# Patient Record
Sex: Male | Born: 1975 | Race: Black or African American | Hispanic: No | Marital: Single | Smoking: Current every day smoker
Health system: Southern US, Community
[De-identification: ages and names within clinical notes are randomized; demographics above are authoritative.]

## PROBLEM LIST (undated history)

## (undated) DIAGNOSIS — E669 Obesity, unspecified: Secondary | ICD-10-CM

## (undated) DIAGNOSIS — I4891 Unspecified atrial fibrillation: Secondary | ICD-10-CM

## (undated) DIAGNOSIS — Z720 Tobacco use: Secondary | ICD-10-CM

## (undated) DIAGNOSIS — I5022 Chronic systolic (congestive) heart failure: Secondary | ICD-10-CM

---

## 2015-09-17 ENCOUNTER — Inpatient Hospital Stay
Admission: EM | Admit: 2015-09-17 | Discharge: 2015-10-20 | DRG: 004 | Disposition: E | Payer: Medicaid Other | Attending: Specialist | Admitting: Specialist

## 2015-09-17 ENCOUNTER — Encounter: Payer: Self-pay | Admitting: Emergency Medicine

## 2015-09-17 ENCOUNTER — Emergency Department: Payer: Medicaid Other

## 2015-09-17 DIAGNOSIS — J9600 Acute respiratory failure, unspecified whether with hypoxia or hypercapnia: Secondary | ICD-10-CM

## 2015-09-17 DIAGNOSIS — R570 Cardiogenic shock: Secondary | ICD-10-CM | POA: Diagnosis not present

## 2015-09-17 DIAGNOSIS — F329 Major depressive disorder, single episode, unspecified: Secondary | ICD-10-CM | POA: Diagnosis present

## 2015-09-17 DIAGNOSIS — Z9911 Dependence on respirator [ventilator] status: Secondary | ICD-10-CM | POA: Diagnosis not present

## 2015-09-17 DIAGNOSIS — J80 Acute respiratory distress syndrome: Secondary | ICD-10-CM | POA: Diagnosis present

## 2015-09-17 DIAGNOSIS — F1410 Cocaine abuse, uncomplicated: Secondary | ICD-10-CM | POA: Diagnosis present

## 2015-09-17 DIAGNOSIS — J9690 Respiratory failure, unspecified, unspecified whether with hypoxia or hypercapnia: Secondary | ICD-10-CM

## 2015-09-17 DIAGNOSIS — Z4659 Encounter for fitting and adjustment of other gastrointestinal appliance and device: Secondary | ICD-10-CM | POA: Diagnosis not present

## 2015-09-17 DIAGNOSIS — Z452 Encounter for adjustment and management of vascular access device: Secondary | ICD-10-CM

## 2015-09-17 DIAGNOSIS — Z66 Do not resuscitate: Secondary | ICD-10-CM | POA: Diagnosis present

## 2015-09-17 DIAGNOSIS — R6521 Severe sepsis with septic shock: Secondary | ICD-10-CM | POA: Diagnosis present

## 2015-09-17 DIAGNOSIS — J9621 Acute and chronic respiratory failure with hypoxia: Secondary | ICD-10-CM | POA: Diagnosis not present

## 2015-09-17 DIAGNOSIS — N170 Acute kidney failure with tubular necrosis: Secondary | ICD-10-CM | POA: Diagnosis present

## 2015-09-17 DIAGNOSIS — I951 Orthostatic hypotension: Secondary | ICD-10-CM | POA: Insufficient documentation

## 2015-09-17 DIAGNOSIS — F17200 Nicotine dependence, unspecified, uncomplicated: Secondary | ICD-10-CM | POA: Diagnosis present

## 2015-09-17 DIAGNOSIS — I5043 Acute on chronic combined systolic (congestive) and diastolic (congestive) heart failure: Secondary | ICD-10-CM | POA: Diagnosis present

## 2015-09-17 DIAGNOSIS — G9341 Metabolic encephalopathy: Secondary | ICD-10-CM | POA: Diagnosis present

## 2015-09-17 DIAGNOSIS — I440 Atrioventricular block, first degree: Secondary | ICD-10-CM | POA: Diagnosis present

## 2015-09-17 DIAGNOSIS — J9602 Acute respiratory failure with hypercapnia: Secondary | ICD-10-CM | POA: Diagnosis present

## 2015-09-17 DIAGNOSIS — I5031 Acute diastolic (congestive) heart failure: Secondary | ICD-10-CM | POA: Diagnosis not present

## 2015-09-17 DIAGNOSIS — F419 Anxiety disorder, unspecified: Secondary | ICD-10-CM | POA: Diagnosis present

## 2015-09-17 DIAGNOSIS — D638 Anemia in other chronic diseases classified elsewhere: Secondary | ICD-10-CM | POA: Diagnosis present

## 2015-09-17 DIAGNOSIS — Z6841 Body Mass Index (BMI) 40.0 and over, adult: Secondary | ICD-10-CM | POA: Diagnosis not present

## 2015-09-17 DIAGNOSIS — D696 Thrombocytopenia, unspecified: Secondary | ICD-10-CM | POA: Diagnosis present

## 2015-09-17 DIAGNOSIS — A419 Sepsis, unspecified organism: Principal | ICD-10-CM | POA: Diagnosis present

## 2015-09-17 DIAGNOSIS — J9601 Acute respiratory failure with hypoxia: Secondary | ICD-10-CM

## 2015-09-17 DIAGNOSIS — G9340 Encephalopathy, unspecified: Secondary | ICD-10-CM | POA: Diagnosis not present

## 2015-09-17 DIAGNOSIS — E872 Acidosis: Secondary | ICD-10-CM | POA: Diagnosis present

## 2015-09-17 DIAGNOSIS — I480 Paroxysmal atrial fibrillation: Secondary | ICD-10-CM | POA: Diagnosis present

## 2015-09-17 DIAGNOSIS — Z7982 Long term (current) use of aspirin: Secondary | ICD-10-CM | POA: Diagnosis not present

## 2015-09-17 DIAGNOSIS — J189 Pneumonia, unspecified organism: Secondary | ICD-10-CM | POA: Diagnosis not present

## 2015-09-17 DIAGNOSIS — E662 Morbid (severe) obesity with alveolar hypoventilation: Secondary | ICD-10-CM | POA: Diagnosis present

## 2015-09-17 DIAGNOSIS — I4892 Unspecified atrial flutter: Secondary | ICD-10-CM | POA: Diagnosis present

## 2015-09-17 DIAGNOSIS — R140 Abdominal distension (gaseous): Secondary | ICD-10-CM

## 2015-09-17 DIAGNOSIS — R34 Anuria and oliguria: Secondary | ICD-10-CM | POA: Diagnosis present

## 2015-09-17 DIAGNOSIS — D7582 Heparin induced thrombocytopenia (HIT): Secondary | ICD-10-CM | POA: Diagnosis present

## 2015-09-17 DIAGNOSIS — T148XXA Other injury of unspecified body region, initial encounter: Secondary | ICD-10-CM

## 2015-09-17 DIAGNOSIS — I4891 Unspecified atrial fibrillation: Secondary | ICD-10-CM | POA: Diagnosis not present

## 2015-09-17 DIAGNOSIS — J9620 Acute and chronic respiratory failure, unspecified whether with hypoxia or hypercapnia: Secondary | ICD-10-CM | POA: Diagnosis not present

## 2015-09-17 DIAGNOSIS — I472 Ventricular tachycardia: Secondary | ICD-10-CM | POA: Diagnosis present

## 2015-09-17 DIAGNOSIS — I959 Hypotension, unspecified: Secondary | ICD-10-CM | POA: Diagnosis not present

## 2015-09-17 DIAGNOSIS — Z716 Tobacco abuse counseling: Secondary | ICD-10-CM

## 2015-09-17 DIAGNOSIS — Z95828 Presence of other vascular implants and grafts: Secondary | ICD-10-CM

## 2015-09-17 DIAGNOSIS — Z01818 Encounter for other preprocedural examination: Secondary | ICD-10-CM

## 2015-09-17 DIAGNOSIS — K761 Chronic passive congestion of liver: Secondary | ICD-10-CM | POA: Diagnosis present

## 2015-09-17 DIAGNOSIS — R17 Unspecified jaundice: Secondary | ICD-10-CM | POA: Diagnosis not present

## 2015-09-17 DIAGNOSIS — I2781 Cor pulmonale (chronic): Secondary | ICD-10-CM | POA: Diagnosis present

## 2015-09-17 DIAGNOSIS — I5033 Acute on chronic diastolic (congestive) heart failure: Secondary | ICD-10-CM | POA: Diagnosis not present

## 2015-09-17 DIAGNOSIS — J9622 Acute and chronic respiratory failure with hypercapnia: Secondary | ICD-10-CM | POA: Diagnosis not present

## 2015-09-17 DIAGNOSIS — K7460 Unspecified cirrhosis of liver: Secondary | ICD-10-CM | POA: Diagnosis present

## 2015-09-17 DIAGNOSIS — Z9119 Patient's noncompliance with other medical treatment and regimen: Secondary | ICD-10-CM | POA: Diagnosis not present

## 2015-09-17 DIAGNOSIS — I509 Heart failure, unspecified: Secondary | ICD-10-CM

## 2015-09-17 DIAGNOSIS — B372 Candidiasis of skin and nail: Secondary | ICD-10-CM | POA: Diagnosis present

## 2015-09-17 DIAGNOSIS — R0600 Dyspnea, unspecified: Secondary | ICD-10-CM

## 2015-09-17 DIAGNOSIS — T884XXA Failed or difficult intubation, initial encounter: Secondary | ICD-10-CM

## 2015-09-17 DIAGNOSIS — Z833 Family history of diabetes mellitus: Secondary | ICD-10-CM

## 2015-09-17 DIAGNOSIS — Z0189 Encounter for other specified special examinations: Secondary | ICD-10-CM

## 2015-09-17 DIAGNOSIS — N179 Acute kidney failure, unspecified: Secondary | ICD-10-CM | POA: Diagnosis not present

## 2015-09-17 DIAGNOSIS — G4730 Sleep apnea, unspecified: Secondary | ICD-10-CM | POA: Diagnosis present

## 2015-09-17 HISTORY — DX: Unspecified atrial fibrillation: I48.91

## 2015-09-17 HISTORY — DX: Chronic systolic (congestive) heart failure: I50.22

## 2015-09-17 HISTORY — DX: Obesity, unspecified: E66.9

## 2015-09-17 HISTORY — DX: Tobacco use: Z72.0

## 2015-09-17 LAB — CBC
HCT: 51 % (ref 40.0–52.0)
HEMOGLOBIN: 15.7 g/dL (ref 13.0–18.0)
MCH: 25.8 pg — AB (ref 26.0–34.0)
MCHC: 30.8 g/dL — AB (ref 32.0–36.0)
MCV: 83.7 fL (ref 80.0–100.0)
Platelets: 74 10*3/uL — ABNORMAL LOW (ref 150–440)
RBC: 6.1 MIL/uL — AB (ref 4.40–5.90)
RDW: 19.7 % — ABNORMAL HIGH (ref 11.5–14.5)
WBC: 7.4 10*3/uL (ref 3.8–10.6)

## 2015-09-17 LAB — COMPREHENSIVE METABOLIC PANEL
ALK PHOS: 93 U/L (ref 38–126)
ALT: 12 U/L — AB (ref 17–63)
ANION GAP: 7 (ref 5–15)
AST: 27 U/L (ref 15–41)
Albumin: 3.7 g/dL (ref 3.5–5.0)
BILIRUBIN TOTAL: 2.8 mg/dL — AB (ref 0.3–1.2)
BUN: 15 mg/dL (ref 6–20)
CALCIUM: 8.7 mg/dL — AB (ref 8.9–10.3)
CO2: 33 mmol/L — AB (ref 22–32)
CREATININE: 1.15 mg/dL (ref 0.61–1.24)
Chloride: 98 mmol/L — ABNORMAL LOW (ref 101–111)
Glucose, Bld: 104 mg/dL — ABNORMAL HIGH (ref 65–99)
Potassium: 4.1 mmol/L (ref 3.5–5.1)
SODIUM: 138 mmol/L (ref 135–145)
TOTAL PROTEIN: 7.9 g/dL (ref 6.5–8.1)

## 2015-09-17 LAB — BRAIN NATRIURETIC PEPTIDE: B NATRIURETIC PEPTIDE 5: 330 pg/mL — AB (ref 0.0–100.0)

## 2015-09-17 LAB — TROPONIN I

## 2015-09-17 MED ORDER — LEVOFLOXACIN IN D5W 750 MG/150ML IV SOLN
750.0000 mg | Freq: Once | INTRAVENOUS | Status: AC
  Administered 2015-09-18: 750 mg via INTRAVENOUS
  Filled 2015-09-17: qty 150

## 2015-09-17 MED ORDER — ALBUTEROL SULFATE (2.5 MG/3ML) 0.083% IN NEBU: 2.5000 mg | INHALATION_SOLUTION | RESPIRATORY_TRACT | Status: DC | PRN

## 2015-09-17 MED ORDER — FUROSEMIDE 10 MG/ML IJ SOLN
60.0000 mg | Freq: Once | INTRAMUSCULAR | Status: AC
  Administered 2015-09-17: 60 mg via INTRAVENOUS
  Filled 2015-09-17: qty 8

## 2015-09-17 MED ORDER — DOCUSATE SODIUM 100 MG PO CAPS
100.0000 mg | ORAL_CAPSULE | Freq: Two times a day (BID) | ORAL | Status: DC
  Administered 2015-09-18 (×2): 100 mg via ORAL
  Filled 2015-09-17 (×3): qty 1

## 2015-09-17 MED ORDER — ONDANSETRON HCL 4 MG/2ML IJ SOLN
4.0000 mg | Freq: Four times a day (QID) | INTRAMUSCULAR | Status: DC | PRN
  Administered 2015-09-20: 4 mg via INTRAVENOUS
  Filled 2015-09-17: qty 2

## 2015-09-17 MED ORDER — ASPIRIN EC 81 MG PO TBEC: 81.0000 mg | DELAYED_RELEASE_TABLET | Freq: Every day | ORAL | Status: DC

## 2015-09-17 MED ORDER — SODIUM CHLORIDE 0.9 % IJ SOLN: 3.0000 mL | INTRAMUSCULAR | Status: DC | PRN

## 2015-09-17 MED ORDER — ACETAMINOPHEN 650 MG RE SUPP: 650.0000 mg | Freq: Four times a day (QID) | RECTAL | Status: DC | PRN

## 2015-09-17 MED ORDER — POLYETHYLENE GLYCOL 3350 17 G PO PACK: 17.0000 g | PACK | Freq: Every day | ORAL | Status: DC | PRN

## 2015-09-17 MED ORDER — SODIUM CHLORIDE 0.9 % IV SOLN: 250.0000 mL | INTRAVENOUS | Status: DC | PRN

## 2015-09-17 MED ORDER — SODIUM CHLORIDE 0.9 % IJ SOLN
3.0000 mL | Freq: Two times a day (BID) | INTRAMUSCULAR | Status: DC
  Administered 2015-09-18 – 2015-10-04 (×23): 3 mL via INTRAVENOUS

## 2015-09-17 MED ORDER — ONDANSETRON HCL 4 MG PO TABS: 4.0000 mg | ORAL_TABLET | Freq: Four times a day (QID) | ORAL | Status: DC | PRN

## 2015-09-17 MED ORDER — SODIUM CHLORIDE 0.9 % IJ SOLN
3.0000 mL | Freq: Two times a day (BID) | INTRAMUSCULAR | Status: DC
  Administered 2015-09-18 – 2015-10-04 (×26): 3 mL via INTRAVENOUS

## 2015-09-17 MED ORDER — ENOXAPARIN SODIUM 40 MG/0.4ML ~~LOC~~ SOLN: 40.0000 mg | Freq: Two times a day (BID) | SUBCUTANEOUS | Status: DC

## 2015-09-17 MED ORDER — BISACODYL 5 MG PO TBEC
5.0000 mg | DELAYED_RELEASE_TABLET | Freq: Every day | ORAL | Status: DC | PRN
Start: 2015-09-17 — End: 2015-09-21

## 2015-09-17 MED ORDER — ACETAMINOPHEN 325 MG PO TABS
650.0000 mg | ORAL_TABLET | Freq: Four times a day (QID) | ORAL | Status: DC | PRN
  Administered 2015-09-23 – 2015-10-14 (×9): 650 mg via ORAL
  Filled 2015-09-17 (×10): qty 2

## 2015-09-17 NOTE — ED Notes (Signed)
Pt to rm 25 via EMS from home.  EMS report pt c/o increased swelling to legs and under arms.  Pt denies hx of CHF or renal disease.  PT reports some SOB.  Pt denies pain.  PT NAD upon arrival, respirations equal and mildly labored, skin warm and dry.  Bari-bed in place.  Pt 73% RA upon arrival, 4L [place] applied

## 2015-09-17 NOTE — ED Provider Notes (Addendum)
[facility] Emergency Department Provider Note  Time seen: 9:56 PM  I have reviewed the triage vital signs and the nursing notes.   HISTORY  Chief Complaint Leg Swelling    HPI Mitchell Morrow is a 39 y.o. male with no past medical history besides morbid obesity presents the emergency department for increased swelling in his legs and arms, shortness of breath. According to the patient for the past week or so he has noticed increased swelling in his legs, and under his arms. He also states he has been progressively more short of breath over the past one week. Does not wear oxygen at home. Per the patient he has no medical conditions does not take any medications. Patient is morbidly obese weighs 560 pounds. Denies any chest pain but does state shortness of breath. Upon arrival patient is 73% O2 saturation on room air with good waveform.   History reviewed. No pertinent past medical history.  There are no active problems to display for this patient.   History reviewed. No pertinent past surgical history.  No current outpatient prescriptions on file.  Allergies Review of patient's allergies indicates no known allergies.  History reviewed. No pertinent family history.  Social History Social History  Substance Use Topics  . Smoking status: Current Every Day Smoker  . Smokeless tobacco: None  . Alcohol Use: No    Review of Systems Constitutional: Negative for fever. Cardiovascular: Negative for chest pain. Respiratory: Moderate shortness of breath, much worse with exertion. Gastrointestinal: Negative for abdominal pain, vomiting and diarrhea. Genitourinary: Negative for dysuria. Neurological: Negative for headache  10-point ROS otherwise negative.  ____________________________________________   PHYSICAL EXAM:  VITAL SIGNS: ED Triage Vitals  Enc Vitals Group     BP 2015-07-16 2148 117/85 mmHg     Pulse Rate 2015-07-16 2148 108     Resp 2015-07-16  2148 24     Temp 2015-07-16 2148 97.4 F (36.3 C)     Temp Source 2015-07-16 2148 Oral     SpO2 2015-07-16 2148 73 %     Weight 2015-07-16 2148 564 lb (255.829 kg)     Height 2015-07-16 2148 5\' 9"  (1.753 m)     Head Cir --      Peak Flow --      Pain Score 2015-07-16 2150 0     Pain Loc --      Pain Edu? --      Excl. in GC? --     Constitutional: Alert and oriented. Well appearing and in no distress. Eyes: Normal exam ENT   Head: Normocephalic and atraumatic.   Mouth/Throat: Mucous membranes are moist. Cardiovascular: Normal rate, regular rhythm. No murmur Respiratory: Tachypnea, no distress. Clear lung sounds bilaterally. No obvious wheeze or rhonchi or rales however lung sounds are rather decreased likely due to body habitus. Gastrointestinal: Nontender, morbidly obese. Musculoskeletal: Nontender, difficult to decipher edema versus adipose, nonpitting but the patient has very thick skin in his lower extremities.  Neurologic:  Normal speech and language. No gross focal neurologic deficits  Skin:  Skin is warm, dry and intact.  Psychiatric: Mood and affect are normal. Speech and behavior are normal.  ____________________________________________    EKG  EKG reviewed and interpreted by myself shows sinus tachycardia 103 bpm, slightly widened QRS, left axis deviation, nonspecific ST changes. No ST elevations.  ____________________________________________    RADIOLOGY  Chest x-ray consistent with multi lobar bronchopneumonia.  ____________________________________________    INITIAL IMPRESSION / ASSESSMENT AND PLAN / ED  COURSE  Pertinent labs & imaging results that were available during my care of the patient were reviewed by me and considered in my medical decision making (see chart for details).  Patient presents for shovel breathing with increased swelling in his extremities. Denies any history of congestive heart failure in the past. Denies any medical problems besides  obesity. Patient's 564 pounds. We will check labs, chest x-ray. Patient has a hypoxic on room air at 73% with a good waveform. Patient satting in the low 90s on 4 L nasal cannula.   Patient remained hypoxic off oxygen. Chest x-ray consistent with multilobar pneumonia. We will check blood cultures, start on IV antibiotics and admitted to the hospital for hypoxia and pneumonia.  ____________________________________________   FINAL CLINICAL IMPRESSION(S) / ED DIAGNOSES  Hypoxia Peripheral edema Pneumonia  Minna Antis, MD 08/26/2015 4098  Minna Antis, MD 09/09/2015 2330

## 2015-09-18 ENCOUNTER — Inpatient Hospital Stay: Admit: 2015-09-18 | Payer: Medicaid Other

## 2015-09-18 ENCOUNTER — Inpatient Hospital Stay
Admit: 2015-09-18 | Discharge: 2015-09-18 | Disposition: A | Discharge status: Home / Self Care | Payer: Medicaid Other | Attending: Internal Medicine | Admitting: Internal Medicine

## 2015-09-18 ENCOUNTER — Inpatient Hospital Stay: Payer: Medicaid Other

## 2015-09-18 LAB — BASIC METABOLIC PANEL
ANION GAP: 5 (ref 5–15)
BUN: 15 mg/dL (ref 6–20)
CALCIUM: 8.5 mg/dL — AB (ref 8.9–10.3)
CO2: 36 mmol/L — AB (ref 22–32)
CREATININE: 1.06 mg/dL (ref 0.61–1.24)
Chloride: 99 mmol/L — ABNORMAL LOW (ref 101–111)
GFR calc Af Amer: >60 mL/min (ref >60)
GFR calc non Af Amer: >60 mL/min (ref >60)
GLUCOSE: 97 mg/dL (ref 65–99)
Potassium: 3.7 mmol/L (ref 3.5–5.1)
Sodium: 140 mmol/L (ref 135–145)

## 2015-09-18 LAB — STREP PNEUMONIAE URINARY ANTIGEN: Strep Pneumo Urinary Antigen: NEGATIVE

## 2015-09-18 LAB — CBC
HCT: 47.8 % (ref 40.0–52.0)
HEMOGLOBIN: 14.6 g/dL (ref 13.0–18.0)
MCH: 25.5 pg — AB (ref 26.0–34.0)
MCHC: 30.6 g/dL — AB (ref 32.0–36.0)
MCV: 83.3 fL (ref 80.0–100.0)
Platelets: 69 10*3/uL — ABNORMAL LOW (ref 150–440)
RBC: 5.74 MIL/uL (ref 4.40–5.90)
RDW: 19.5 % — ABNORMAL HIGH (ref 11.5–14.5)
WBC: 7 10*3/uL (ref 3.8–10.6)

## 2015-09-18 LAB — URINALYSIS COMPLETE WITH MICROSCOPIC (ARMC ONLY)
Bacteria, UA: NONE SEEN
Bilirubin Urine: NEGATIVE
Glucose, UA: NEGATIVE mg/dL
KETONES UR: NEGATIVE mg/dL
LEUKOCYTES UA: NEGATIVE
NITRITE: NEGATIVE
PH: 6 (ref 5.0–8.0)
PROTEIN: 100 mg/dL — AB
SPECIFIC GRAVITY, URINE: 1.009 (ref 1.005–1.030)

## 2015-09-18 LAB — VITAMIN B12: Vitamin B-12: 945 pg/mL — ABNORMAL HIGH (ref 180–914)

## 2015-09-18 LAB — RAPID HIV SCREEN (HIV 1/2 AB+AG)
HIV 1/2 Antibodies: NONREACTIVE
HIV-1 P24 ANTIGEN - HIV24: NONREACTIVE

## 2015-09-18 LAB — URINE DRUG SCREEN, QUALITATIVE (ARMC ONLY)
AMPHETAMINES, UR SCREEN: NOT DETECTED
Barbiturates, Ur Screen: NOT DETECTED
Benzodiazepine, Ur Scrn: NOT DETECTED
Cannabinoid 50 Ng, Ur ~~LOC~~: NOT DETECTED
Cocaine Metabolite,Ur ~~LOC~~: POSITIVE — AB
MDMA (ECSTASY) UR SCREEN: NOT DETECTED
Methadone Scn, Ur: NOT DETECTED
Opiate, Ur Screen: NOT DETECTED
PHENCYCLIDINE (PCP) UR S: NOT DETECTED
Tricyclic, Ur Screen: NOT DETECTED

## 2015-09-18 LAB — RAPID INFLUENZA A&B ANTIGENS
Influenza A (ARMC): NOT DETECTED
Influenza B (ARMC): NOT DETECTED

## 2015-09-18 LAB — LACTATE DEHYDROGENASE: LDH: 255 U/L — AB (ref 98–192)

## 2015-09-18 MED ORDER — NYSTATIN 100000 UNIT/GM EX POWD
Freq: Two times a day (BID) | CUTANEOUS | Status: DC
  Administered 2015-09-18 – 2015-10-18 (×62): via TOPICAL
  Filled 2015-09-18 (×5): qty 15

## 2015-09-18 MED ORDER — FUROSEMIDE 10 MG/ML IJ SOLN
40.0000 mg | Freq: Two times a day (BID) | INTRAMUSCULAR | Status: DC
  Administered 2015-09-18 – 2015-09-22 (×9): 40 mg via INTRAVENOUS
  Filled 2015-09-18 (×9): qty 4

## 2015-09-18 MED ORDER — LEVOFLOXACIN IN D5W 750 MG/150ML IV SOLN
750.0000 mg | INTRAVENOUS | Status: DC
Start: 2015-09-19 — End: 2015-09-19
  Administered 2015-09-19: 750 mg via INTRAVENOUS
  Filled 2015-09-18 (×2): qty 150

## 2015-09-18 NOTE — Progress Notes (Signed)
Patient back to bed with max assist x3. Patient resting quietly with bed in lowest position, bed alarm on and call light in reach. Will continue to monitor.

## 2015-09-18 NOTE — Progress Notes (Signed)
Nurse was informed that patient had 9 beats of Vtach. Patient was sleep at the time of irregular heart beat. VS stable at this time. Informed Dr. Clint GuyHower about patient's irregular Heart beat. New order mag level. Will notify Dr. Clint Guyhower if mag level is below 2. Will continue to monitor.

## 2015-09-18 NOTE — Progress Notes (Signed)
*  PRELIMINARY RESULTS* Echocardiogram 2D Echocardiogram has been performed.  Mitchell Morrow 09/18/2015, 12:27 PM

## 2015-09-18 NOTE — H&P (Addendum)
[facility] [place] [facility]   PATIENT NAME: Mitchell Morrow    MR#:  [id number]  DATE OF BIRTH:  Jul 05, 1976  DATE OF ADMISSION:  06-Oct-2015  PRIMARY CARE PHYSICIAN: No PCP Per Patient   REQUESTING/REFERRING PHYSICIAN: Dr. Laroy Apple  CHIEF COMPLAINT:   Chief Complaint  Patient presents with  . Leg Swelling    HISTORY OF PRESENT ILLNESS:  Mitchell Morrow  is a 39 y.o. male with a known history of morbid obesity, tobacco abuse presents to the emergency room complaining of worsening upper and lower extremity swelling for 2 weeks. He has also noticed fatigue, dark urine. Has had cough without sputum. Shortness of breath and wheezing. No change in weight recently. Patient is mostly bedbound and molds to his chair with help. Lives with his friends. No nausea or vomiting. No abdominal pain or chest pain. No history of congestive heart failure or renal disease. Patient was found to have saturations at 74% on room air which improved to 92% on 4 L oxygen. Chest x-ray showed bilateral pneumonia. Cardiomegaly and vascular congestion.  PAST MEDICAL HISTORY:   Past Medical History  Diagnosis Date  . Morbid obesity (HCC)   . Tobacco abuse     PAST SURGICAL HISTORY:  History reviewed. No pertinent past surgical history.  SOCIAL HISTORY:   Social History  Substance Use Topics  . Smoking status: Current Every Day Smoker  . Smokeless tobacco: Not on file  . Alcohol Use: No    FAMILY HISTORY:   Family History  Problem Relation Age of Onset  . Diabetes Other     DRUG ALLERGIES:  No Known Allergies  REVIEW OF SYSTEMS:   Review of Systems  Constitutional: Positive for malaise/fatigue. Negative for fever, chills and weight loss.  HENT: Negative for hearing loss and nosebleeds.   Eyes: Negative for blurred vision, double vision and pain.  Respiratory: Positive for cough, shortness of breath and wheezing. Negative for hemoptysis and sputum production.    Cardiovascular: Positive for leg swelling. Negative for chest pain, palpitations and orthopnea.  Gastrointestinal: Negative for nausea, vomiting, abdominal pain, diarrhea and constipation.  Genitourinary: Negative for dysuria and hematuria.  Musculoskeletal: Negative for myalgias, back pain and falls.  Skin: Positive for rash.  Neurological: Positive for weakness. Negative for dizziness, tremors, sensory change, speech change, focal weakness, seizures and headaches.  Endo/Heme/Allergies: Does not bruise/bleed easily.  Psychiatric/Behavioral: Negative for depression and memory loss. The patient is not nervous/anxious.     MEDICATIONS AT HOME:   Prior to Admission medications   Not on File      VITAL SIGNS:  Blood pressure 107/70, pulse 98, temperature 97.4 F (36.3 C), temperature source Oral, resp. rate 19, height  (1.753 m), weight 255.829 kg (564 lb), SpO2 92 %.  PHYSICAL EXAMINATION:  Physical Exam  GENERAL:  39 y.o.-year-old patient lying in the bed with respiratory distress. Morbidly obese EYES: Pupils equal, round, reactive to light and accommodation. No scleral icterus. Extraocular muscles intact.  HEENT: Head atraumatic, normocephalic. Oropharynx and nasopharynx clear. No oropharyngeal erythema, moist oral mucosa  NECK:  Supple, no jugular venous distention. No thyroid enlargement, no tenderness.  LUNGS: Poor air entry on both sides with bilateral wheezing. CARDIOVASCULAR: S1, S2 normal. No murmurs, rubs, or gallops.  ABDOMEN: Soft, nontender, nondistended. Bowel sounds present. No organomegaly or mass.  EXTREMITIES: No  cyanosis, or clubbing. + 2 pedal & radial pulses b/l.   bilateral lower extremity edema  NEUROLOGIC: Cranial nerves II  through XII are intact. No focal Motor or sensory deficits appreciated b/l. PSYCHIATRIC: The patient is alert and oriented x 3. Good affect.  SKIN: Dry skin. Erythema in inframammary and groin folds.   LABORATORY PANEL:    CBC  Recent Labs Lab 09/03/2015 2156  WBC 7.4  HGB 15.7  HCT 51.0  PLT 74*   ------------------------------------------------------------------------------------------------------------------  Chemistries   Recent Labs Lab 09/05/2015 2156  NA 138  K 4.1  CL 98*  CO2 33*  GLUCOSE 104*  BUN 15  CREATININE 1.15  CALCIUM 8.7*  AST 27  ALT 12*  ALKPHOS 93  BILITOT 2.8*   ------------------------------------------------------------------------------------------------------------------  Cardiac Enzymes  Recent Labs Lab 08/30/2015 2156  TROPONINI <0.03   ------------------------------------------------------------------------------------------------------------------  RADIOLOGY:  Dg Chest Portable 1 View  09/04/2015  CLINICAL DATA:  39 year old male with swelling in the legs and under the arms. EXAM: PORTABLE CHEST 1 VIEW COMPARISON:  No priors. FINDINGS: Lung volumes are low. Ill-defined opacities throughout the right mid to lower lung, concerning for probable multilobar bronchopneumonia. Left lung appears clear, although assessment is severely limited by the patient's large body habitus. No definite pleural effusions. Heart size is moderately enlarged. Cephalization of the pulmonary vasculature. Upper mediastinal contours are within normal limits allowing for the lordotic positioning and patient's slight rotation to the left. IMPRESSION: 1. Findings concerning for multilobar bronchopneumonia involving the right middle and lower lobes. Followup PA and lateral chest X-ray is recommended in 3-4 weeks following trial of antibiotic therapy to ensure resolution and exclude underlying malignancy. 2. Moderate cardiomegaly with pulmonary venous congestion, but no frank pulmonary edema. Electronically Signed   By: Trudie Reed M.D.   On: 09/13/2015 22:45     IMPRESSION AND PLAN:   * Bilateral community-acquired pneumonia  start patient on IV Levaquin. Send for urine streptococcus  and legionella antigens. Sputum culture. Check influenza PCR. Oxygen to keep saturations over 90%. Albuterol nebulizer when necessary. Patient likely has underlying COPD with his smoking history. He does have wheezing and will start him on IV Solu-Medrol. Wean oxygen as tolerated. He also likely has underlying sleep apnea and will need pulmonary follow-up after discharge.  * Acute hypoxic respiratory failure  * New onset congestive heart failure, systolic versus diastolic - has elevated BNP, edema and vascular congestion on chest x-ray   check echocardiogram. Start IV Lasix 40 mg twice a day. Monitor input and output along with daily weight. Repeat potassium and replace as needed.  * Cutaneous candidiasis  nystatin powder  * Morbid obesity  will need bariatric referral as outpatient  * Thrombocytopenia - without acute bleeding   etiology unclear. No old labs available. No aspirin, heparin or Lovenox. Check hepatitis panel, HIV, LDH, B12. Could also be due to acute infection. Consult hematology if no improvement. No fever or mental status change or renal failure.  * Hyperbilirubinemia  could be passive congestion from congestive heart failure. Check hepatitis panel and ultrasound of right upper quadrant.  * Tobacco abuse  counseled to quit smoking for greater than 3 minutes.   * DVT prophylaxis with SCDs. No heparin or Lovenox due to thrombocytopenia.    All the records are reviewed and case discussed with ED provider. Management plans discussed with the patient, family and they are in agreement.  CODE STATUS: FULL  TOTAL TIME TAKING CARE OF THIS PATIENT: 50 minutes.    Milagros Loll R M.D on 09/18/2015 at 12:00 AM  Between 7am to 6pm - Pager - [phone]  After 6pm  go to [email or web address] - password [email or web address]  [facility]  Office  [phone]  CC: Primary care physician; No PCP Per Patient   Note: This dictation was prepared with Dragon  dictation along with smaller phrase technology. Any transcriptional errors that result from this process are unintentional.

## 2015-09-18 NOTE — Progress Notes (Signed)
Ultrasound technician called RN wondering if abdominal ultrasound could wait until morning. MD notified to confirm. MD Hower, confirmed that it was okay to wait until the morning for ultrasound. Order modified to reflect this change. Ultrasound technician called and updated on this information.   Mitchell Morrow, Rebecca Motta M

## 2015-09-18 NOTE — Progress Notes (Signed)
Patient up out of bed with max assist x3. Complete bath and linen change done with patient standing at bedside. Patient in chair resting with chair alarm on and call light in reach. Will continue to monitor.

## 2015-09-19 DIAGNOSIS — I4891 Unspecified atrial fibrillation: Secondary | ICD-10-CM

## 2015-09-19 HISTORY — DX: Unspecified atrial fibrillation: I48.91

## 2015-09-19 LAB — COMPREHENSIVE METABOLIC PANEL
ALBUMIN: 3.6 g/dL (ref 3.5–5.0)
ALT: 12 U/L — ABNORMAL LOW (ref 17–63)
ANION GAP: 8 (ref 5–15)
AST: 25 U/L (ref 15–41)
Alkaline Phosphatase: 87 U/L (ref 38–126)
BUN: 15 mg/dL (ref 6–20)
CALCIUM: 8.4 mg/dL — AB (ref 8.9–10.3)
CHLORIDE: 96 mmol/L — AB (ref 101–111)
CO2: 35 mmol/L — ABNORMAL HIGH (ref 22–32)
Creatinine, Ser: 1.12 mg/dL (ref 0.61–1.24)
GFR calc Af Amer: >60 mL/min (ref >60)
GFR calc non Af Amer: >60 mL/min (ref >60)
GLUCOSE: 131 mg/dL — AB (ref 65–99)
POTASSIUM: 3.9 mmol/L (ref 3.5–5.1)
Sodium: 139 mmol/L (ref 135–145)
TOTAL PROTEIN: 7.5 g/dL (ref 6.5–8.1)
Total Bilirubin: 2.8 mg/dL — ABNORMAL HIGH (ref 0.3–1.2)

## 2015-09-19 LAB — CBC
HEMATOCRIT: 49 % (ref 40.0–52.0)
HEMOGLOBIN: 14.4 g/dL (ref 13.0–18.0)
MCH: 25 pg — AB (ref 26.0–34.0)
MCHC: 29.3 g/dL — AB (ref 32.0–36.0)
MCV: 85.5 fL (ref 80.0–100.0)
Platelets: 69 10*3/uL — ABNORMAL LOW (ref 150–440)
RBC: 5.73 MIL/uL (ref 4.40–5.90)
RDW: 19.6 % — ABNORMAL HIGH (ref 11.5–14.5)
WBC: 6.8 10*3/uL (ref 3.8–10.6)

## 2015-09-19 LAB — URINE CULTURE

## 2015-09-19 LAB — HEPATITIS PANEL, ACUTE
HCV Ab: 0.2 s/co ratio (ref 0.0–0.9)
Hep A IgM: NEGATIVE
Hep B C IgM: NEGATIVE
Hepatitis B Surface Ag: NEGATIVE

## 2015-09-19 LAB — MAGNESIUM: Magnesium: 1.9 mg/dL (ref 1.7–2.4)

## 2015-09-19 MED ORDER — LEVOFLOXACIN 750 MG PO TABS: 750.0000 mg | ORAL_TABLET | Freq: Every day | ORAL | Status: DC

## 2015-09-19 MED ORDER — MAGNESIUM SULFATE 2 GM/50ML IV SOLN
2.0000 g | Freq: Once | INTRAVENOUS | Status: AC
  Administered 2015-09-19: 2 g via INTRAVENOUS
  Filled 2015-09-19: qty 50

## 2015-09-19 MED ORDER — GUAIFENESIN 100 MG/5ML PO SOLN
10.0000 mL | ORAL | Status: DC | PRN
  Administered 2015-09-19: 200 mg via ORAL
  Filled 2015-09-19: qty 10

## 2015-09-19 MED ORDER — DILTIAZEM HCL 25 MG/5ML IV SOLN
15.0000 mg | INTRAVENOUS | Status: AC
  Administered 2015-09-19: 15 mg via INTRAVENOUS
  Filled 2015-09-19: qty 5

## 2015-09-19 MED ORDER — SODIUM CHLORIDE 0.9 % IV BOLUS (SEPSIS)
500.0000 mL | Freq: Once | INTRAVENOUS | Status: AC
  Administered 2015-09-19: 500 mL via INTRAVENOUS

## 2015-09-19 MED ORDER — ALPRAZOLAM 0.5 MG PO TABS
0.5000 mg | ORAL_TABLET | Freq: Once | ORAL | Status: AC
  Administered 2015-09-19: 0.5 mg via ORAL
  Filled 2015-09-19: qty 1

## 2015-09-19 NOTE — Progress Notes (Signed)
[facility] - [place] at [facility]   PATIENT NAME: Mitchell Morrow    MR#:  [id number]  DATE OF BIRTH:  08-29-1976  SUBJECTIVE:  CHIEF COMPLAINT:   Chief Complaint  Patient presents with  . Leg Swelling    Came with SOB, found having Pneumonia, sleepy, no distress.  REVIEW OF SYSTEMS:  CONSTITUTIONAL: No fever, fatigue or weakness.  EYES: No blurred or double vision.  EARS, NOSE, AND THROAT: No tinnitus or ear pain.  RESPIRATORY: No cough, positive for shortness of breath,no wheezing or hemoptysis.  CARDIOVASCULAR: No chest pain, orthopnea, edema.  GASTROINTESTINAL: No nausea, vomiting, diarrhea or abdominal pain.  GENITOURINARY: No dysuria, hematuria.  ENDOCRINE: No polyuria, nocturia,  HEMATOLOGY: No anemia, easy bruising or bleeding SKIN: No rash or lesion. MUSCULOSKELETAL: No joint pain or arthritis.   NEUROLOGIC: No tingling, numbness, weakness.  PSYCHIATRY: No anxiety or depression.   ROS  DRUG ALLERGIES:  No Known Allergies  VITALS:  Blood pressure 106/56, pulse 102, temperature 98.4 F (36.9 C), temperature source Oral, resp. rate 18, height 5\' 9"  (1.753 m), weight 249.07 kg (549 lb 1.6 oz), SpO2 92 %.  PHYSICAL EXAMINATION:  GENERAL:  40 y.o.-year-old morbidly obase patient lying in the bed with no acute distress.  EYES: Pupils equal, round, reactive to light and accommodation. No scleral icterus. Extraocular muscles intact.  HEENT: Head atraumatic, normocephalic. Oropharynx and nasopharynx clear.  NECK:  Supple, no jugular venous distention. No thyroid enlargement, no tenderness.  LUNGS: Normal breath sounds bilaterally, no wheezing,some crepitation. No use of accessory muscles of respiration.   using nasal canula oxygen, exam limited due to extreme obesity. CARDIOVASCULAR: S1, S2 normal. No murmurs, rubs, or gallops.  ABDOMEN: Soft, nontender, nondistended. Bowel sounds present. No organomegaly or mass.  EXTREMITIES: severe chronic  pedal edema,no cyanosis, or clubbing.  NEUROLOGIC: Cranial nerves II through XII are intact. Muscle strength 4/5 in all extremities. Sensation intact. Gait not checked.  PSYCHIATRIC: The patient is alert and oriented x 3.  SKIN: No obvious rash, lesion, or ulcer.   Physical Exam LABORATORY PANEL:   CBC  Recent Labs Lab 09/19/15 0436  WBC 6.8  HGB 14.4  HCT 49.0  PLT 69*   ------------------------------------------------------------------------------------------------------------------  Chemistries   Recent Labs Lab 09/18/15 0448 09/19/15 0436  NA  --  139  K  --  3.9  CL  --  96*  CO2  --  35*  GLUCOSE  --  131*  BUN  --  15  CREATININE  --  1.12  CALCIUM  --  8.4*  MG 1.9  --   AST  --  25  ALT  --  12*  ALKPHOS  --  87  BILITOT  --  2.8*   ------------------------------------------------------------------------------------------------------------------  Cardiac Enzymes  Recent Labs Lab 09/10/2015 2156  TROPONINI <0.03   ------------------------------------------------------------------------------------------------------------------  RADIOLOGY:  Dg Chest Portable 1 View  09/16/2015  CLINICAL DATA:  40 year old male with swelling in the legs and under the arms. EXAM: PORTABLE CHEST 1 VIEW COMPARISON:  No priors. FINDINGS: Lung volumes are low. Ill-defined opacities throughout the right mid to lower lung, concerning for probable multilobar bronchopneumonia. Left lung appears clear, although assessment is severely limited by the patient's large body habitus. No definite pleural effusions. Heart size is moderately enlarged. Cephalization of the pulmonary vasculature. Upper mediastinal contours are within normal limits allowing for the lordotic positioning and patient's slight rotation to the left. IMPRESSION: 1. Findings concerning for multilobar bronchopneumonia involving the right  middle and lower lobes. Followup PA and lateral chest X-ray is recommended in 3-4  weeks following trial of antibiotic therapy to ensure resolution and exclude underlying malignancy. 2. Moderate cardiomegaly with pulmonary venous congestion, but no frank pulmonary edema. Electronically Signed   By: Trudie Reedaniel  Entrikin M.D.   On: 2015-09-01 22:45   [place] Abdomen Limited Ruq  09/18/2015  CLINICAL DATA:  Hyperbilirubinemia.  Morbid obesity. EXAM: US ABDOMEN LIMITED - RIGHT UPPER QUADRANT COMPARISON:  None. FINDINGS: Gallbladder: Gallbladder wall is thickened, 3.6 mm. No sonographic Murphy sign. No gallbladder %%%%%% a no gallstones identified. No pericholecystic fluid. Common bile duct: Diameter: 5.0 mm Liver: Liver contour is nodular. No focal liver lesions are identified. Liver echotexture is mildly heterogeneous. Additional: Right small right pleural effusion. Study quality is degraded by patient body habitus and limited patient mobility. IMPRESSION: 1. Nodular contour the liver raising the question of cirrhosis. 2. Gallbladder wall thickening, nonspecific in appears. 3. Small right pleural effusion. Electronically Signed   By: Norva PavlovElizabeth  Brown M.D.   On: 09/18/2015 10:14    ASSESSMENT AND PLAN:   Active Problems:   Pneumonia  * Bilateral community-acquired pneumonia on IV Levaquin. Send for urine streptococcus and legionella antigens. Sputum culture. Check influenza PCR. Oxygen to keep saturations over 90%. Albuterol nebulizer when necessary.  likely has underlying COPD with his smoking history. He does have wheezing , on IV Solu-Medrol. Wean oxygen as tolerated. He also likely has underlying sleep apnea and will need pulmonary follow-up after discharge.  * Acute hypoxic respiratory failure  * New onset congestive heart failure, systolic versus diastolic - has elevated BNP, edema and vascular congestion on chest x-ray  check echocardiogram. Started IV Lasix 40 mg twice a day. Monitor input and output along with daily weight. Repeat potassium and replace as needed.  *  Cutaneous candidiasis nystatin powder  * Morbid obesity will need bariatric referral as outpatient  * Thrombocytopenia - without acute bleeding  etiology unclear. No old labs available. No aspirin, heparin or Lovenox. Check hepatitis panel, HIV, LDH, B12. Could also be due to acute infection. Consult hematology if no improvement. No fever or mental status change or renal failure.  * Hyperbilirubinemia could be passive congestion from congestive heart failure. hepatitis panel and ultrasound of right upper quadrant suggestive of cirrhotic changes.  * Tobacco abuse counseled to quit smoking for greater than 3 minutes.   * DVT prophylaxis with SCDs. No heparin or Lovenox due to thrombocytopenia.    All the records are reviewed and case discussed with Care Management/Social Workerr. Management plans discussed with the patient, family and they are in agreement.  CODE STATUS: full.  TOTAL TIME TAKING CARE OF THIS PATIENT: 35 minutes.   POSSIBLE D/C IN 1-2 DAYS, DEPENDING ON CLINICAL CONDITION.   Altamese DillingVACHHANI, Atisha Hamidi M.D on 09/19/2015   Between 7am to 6pm - Pager - [phone]  After 6pm go to [email or web address] - password [email or web address] [facility]  [facility]  Office  [phone]  CC: Primary care physician; No PCP Per Patient  Note: This dictation was prepared with Dragon dictation along with smaller phrase technology. Any transcriptional errors that result from this process are unintentional.

## 2015-09-19 NOTE — Progress Notes (Signed)
Nurse informed Dr. Elpidio AnisSudini that patient's EKG is SVT 140HR sustained. New order cardizem 15mg 

## 2015-09-19 NOTE — Progress Notes (Signed)
Informed Dr. Clint GuyHower that patient HR is sustaining at 140 sinus tach, has pneumonia on abt. HR was 110 earlier  throughout shift. No new orders at this time.  Informed Dr. Clint GuyHower that patient has a productive cough.new order: guaifenesin 10ml.

## 2015-09-19 NOTE — Progress Notes (Signed)
Informed Dr. Clint GuyHower about patient's mag level 1.9, and informed him about patient's sustained HR. New order: mag 2g and 500cc bolus.

## 2015-09-19 NOTE — Progress Notes (Signed)
PHARMACIST - PHYSICIAN COMMUNICATION DR:   Elpidio AnisSudini CONCERNING: Antibiotic IV to Oral Route Change Policy  RECOMMENDATION: This patient is receiving Levofloxacin  by the intravenous route.  Based on criteria approved by the Pharmacy and Therapeutics Committee, the antibiotic(s) is/are being converted to the equivalent oral dose form(s).   DESCRIPTION: These criteria include:  Patient being treated for a respiratory tract infection, urinary tract infection, cellulitis or clostridium difficile associated diarrhea if on metronidazole  The patient is not neutropenic and does not exhibit a GI malabsorption state  The patient is eating (either orally or via tube) and/or has been taking other orally administered medications for a least 24 hours  The patient is improving clinically and has a Tmax < 100.5  If you have questions about this conversion, please contact the Pharmacy Department  []   [phone]( [phone] )  [facility] [x]   [phone] )  [facility] []   [phone] )  [facility] []   [phone]  [facility] []   [phone]( [phone]  [facility]     Demetrius Charityeldrin D. Jalene Lacko, PharmD

## 2015-09-19 NOTE — Progress Notes (Signed)
[facility] - [place] at [facility]   PATIENT NAME: Mitchell Morrow    MR#:  [id number]  DATE OF BIRTH:  09/30/1975  SUBJECTIVE:  CHIEF COMPLAINT:   Chief Complaint  Patient presents with  . Leg Swelling    Came with SOB, found having Pneumonia, more alert today, had some tachycardia in morning due to anxiety- calm and stable after Xanax.  REVIEW OF SYSTEMS:  CONSTITUTIONAL: No fever, fatigue or weakness.  EYES: No blurred or double vision.  EARS, NOSE, AND THROAT: No tinnitus or ear pain.  RESPIRATORY: No cough, positive for shortness of breath,no wheezing or hemoptysis.  CARDIOVASCULAR: No chest pain, orthopnea, edema.  GASTROINTESTINAL: No nausea, vomiting, diarrhea or abdominal pain.  GENITOURINARY: No dysuria, hematuria.  ENDOCRINE: No polyuria, nocturia,  HEMATOLOGY: No anemia, easy bruising or bleeding SKIN: No rash or lesion. MUSCULOSKELETAL: No joint pain or arthritis.   NEUROLOGIC: No tingling, numbness, weakness.  PSYCHIATRY: No anxiety or depression.   ROS  DRUG ALLERGIES:  No Known Allergies  VITALS:  Blood pressure 106/56, pulse 102, temperature 98.4 F (36.9 C), temperature source Oral, resp. rate 18, height 5\' 9"  (1.753 m), weight 249.07 kg (549 lb 1.6 oz), SpO2 92 %.  PHYSICAL EXAMINATION:  GENERAL:  40 y.o.-year-old morbidly obase patient lying in the bed with no acute distress.  EYES: Pupils equal, round, reactive to light and accommodation. No scleral icterus. Extraocular muscles intact.  HEENT: Head atraumatic, normocephalic. Oropharynx and nasopharynx clear.  NECK:  Supple, no jugular venous distention. No thyroid enlargement, no tenderness.  LUNGS: Normal breath sounds bilaterally, no wheezing,some crepitation. No use of accessory muscles of respiration.   using nasal canula oxygen, exam limited due to extreme obesity. CARDIOVASCULAR: S1, S2 normal. No murmurs, rubs, or gallops.  ABDOMEN: Soft, nontender, nondistended. Bowel  sounds present. No organomegaly or mass.  EXTREMITIES: severe chronic pedal edema,no cyanosis, or clubbing.  NEUROLOGIC: Cranial nerves II through XII are intact. Muscle strength 4/5 in all extremities. Sensation intact. Gait not checked.  PSYCHIATRIC: The patient is alert and oriented x 3.  SKIN: No obvious rash, lesion, or ulcer.   Physical Exam LABORATORY PANEL:   CBC  Recent Labs Lab 09/19/15 0436  WBC 6.8  HGB 14.4  HCT 49.0  PLT 69*   ------------------------------------------------------------------------------------------------------------------  Chemistries   Recent Labs Lab 09/18/15 0448 09/19/15 0436  NA  --  139  K  --  3.9  CL  --  96*  CO2  --  35*  GLUCOSE  --  131*  BUN  --  15  CREATININE  --  1.12  CALCIUM  --  8.4*  MG 1.9  --   AST  --  25  ALT  --  12*  ALKPHOS  --  87  BILITOT  --  2.8*   ------------------------------------------------------------------------------------------------------------------  Cardiac Enzymes  Recent Labs Lab 08/29/2015 2156  TROPONINI <0.03   ------------------------------------------------------------------------------------------------------------------  RADIOLOGY:  Dg Chest Portable 1 View  08/24/2015  CLINICAL DATA:  40 year old male with swelling in the legs and under the arms. EXAM: PORTABLE CHEST 1 VIEW COMPARISON:  No priors. FINDINGS: Lung volumes are low. Ill-defined opacities throughout the right mid to lower lung, concerning for probable multilobar bronchopneumonia. Left lung appears clear, although assessment is severely limited by the patient's large body habitus. No definite pleural effusions. Heart size is moderately enlarged. Cephalization of the pulmonary vasculature. Upper mediastinal contours are within normal limits allowing for the lordotic positioning and patient's slight rotation  to the left. IMPRESSION: 1. Findings concerning for multilobar bronchopneumonia involving the right middle and  lower lobes. Followup PA and lateral chest X-ray is recommended in 3-4 weeks following trial of antibiotic therapy to ensure resolution and exclude underlying malignancy. 2. Moderate cardiomegaly with pulmonary venous congestion, but no frank pulmonary edema. Electronically Signed   By: Trudie Reed M.D.   On: 08/25/2015 22:45   US Abdomen Limited Ruq  09/18/2015  CLINICAL DATA:  Hyperbilirubinemia.  Morbid obesity. EXAM: US ABDOMEN LIMITED - RIGHT UPPER QUADRANT COMPARISON:  None. FINDINGS: Gallbladder: Gallbladder wall is thickened, 3.6 mm. No sonographic Murphy sign. No gallbladder %%%%%% a no gallstones identified. No pericholecystic fluid. Common bile duct: Diameter: 5.0 mm Liver: Liver contour is nodular. No focal liver lesions are identified. Liver echotexture is mildly heterogeneous. Additional: Right small right pleural effusion. Study quality is degraded by patient body habitus and limited patient mobility. IMPRESSION: 1. Nodular contour the liver raising the question of cirrhosis. 2. Gallbladder wall thickening, nonspecific in appears. 3. Small right pleural effusion. Electronically Signed   By: Norva Pavlov M.D.   On: 09/18/2015 10:14    ASSESSMENT AND PLAN:   Active Problems:   Pneumonia  * Bilateral community-acquired pneumonia on IV Levaquin. Send for urine streptococcus and legionella antigens. Influenza negative. Oxygen to keep saturations over 90%. Albuterol nebulizer when necessary.  likely has underlying COPD with his smoking history. He does have wheezing , on IV Solu-Medrol. Wean oxygen as tolerated. He also likely has underlying sleep apnea and will need pulmonary follow-up after discharge.  * Acute hypoxic respiratory failure  improving.  may need home o2.  * New onset congestive heart failure, systolic as per Echo- EF 45%  - has elevated BNP, edema and vascular congestion on chest x-ray  Started IV Lasix 40 mg twice a day. Monitor input and output along  with daily weight. Repeat potassium and replace as needed.  * Cutaneous candidiasis nystatin powder  * Morbid obesity will need bariatric referral as outpatient  * Thrombocytopenia - without acute bleeding  etiology unclear. No old labs available. No aspirin, heparin or Lovenox. Pending hepatitis panel, negative HIV. Could also be due to acute infection. Consult hematology if no improvement. No fever or mental status change or renal failure.  * Hyperbilirubinemia could be passive congestion from congestive heart failure. hepatitis panel and ultrasound of right upper quadrant suggestive of cirrhotic changes.  * Tobacco abuse counseled to quit smoking for greater than 3 minutes.   * DVT prophylaxis with SCDs. No heparin or Lovenox due to thrombocytopenia.    All the records are reviewed and case discussed with Care Management/Social Workerr. Management plans discussed with the patient, family and they are in agreement.  CODE STATUS: full.  TOTAL TIME TAKING CARE OF THIS PATIENT: 35 minutes.  Pt is worried about his living condition, will get social worker involved.  POSSIBLE D/C IN 1-2 DAYS, DEPENDING ON CLINICAL CONDITION.   Altamese Dilling M.D on 09/19/2015   Between 7am to 6pm - Pager - [phone]  After 6pm go to [email or web address] - password [email or web address] [facility]  [facility]  Office  [phone]  CC: Primary care physician; No PCP Per Patient  Note: This dictation was prepared with Dragon dictation along with smaller phrase technology. Any transcriptional errors that result from this process are unintentional.

## 2015-09-19 DEATH — deceased

## 2015-09-20 ENCOUNTER — Inpatient Hospital Stay: Payer: Medicaid Other

## 2015-09-20 DIAGNOSIS — J9600 Acute respiratory failure, unspecified whether with hypoxia or hypercapnia: Secondary | ICD-10-CM

## 2015-09-20 DIAGNOSIS — I509 Heart failure, unspecified: Secondary | ICD-10-CM

## 2015-09-20 DIAGNOSIS — F1410 Cocaine abuse, uncomplicated: Secondary | ICD-10-CM

## 2015-09-20 DIAGNOSIS — G9340 Encephalopathy, unspecified: Secondary | ICD-10-CM

## 2015-09-20 LAB — BLOOD GAS, ARTERIAL
Acid-Base Excess: 6.8 mmol/L — ABNORMAL HIGH (ref 0.0–3.0)
Allens test (pass/fail): POSITIVE — AB
BICARBONATE: 36.4 meq/L — AB (ref 21.0–28.0)
Delivery systems: POSITIVE
EXPIRATORY PAP: 12
FIO2: 0.6
FIO2: 0.5
FIO2: 0.4
INSPIRATORY PAP: 18
MECHANICAL RATE: 20
MECHANICAL RATE: 20
O2 Saturation: 90.1 %
PATIENT TEMPERATURE: 37
PCO2 ART: 74 mmHg — AB (ref 32.0–48.0)
PEEP: 5 cmH2O
PH ART: 7.03 — AB (ref 7.350–7.450)
PH ART: 7.04 — AB (ref 7.350–7.450)
PO2 ART: 53 mmHg — AB (ref 83.0–108.0)
PO2 ART: 65 mmHg — AB (ref 83.0–108.0)
Patient temperature: 37
Patient temperature: 37
RATE: 20 resp/min
VT: 500 mL
pCO2 arterial: 155 mmHg (ref 32.0–48.0)
pCO2 arterial: 156 mmHg (ref 32.0–48.0)
pH, Arterial: 7.3 — ABNORMAL LOW (ref 7.350–7.450)
pO2, Arterial: 68 mmHg — ABNORMAL LOW (ref 83.0–108.0)

## 2015-09-20 LAB — GLUCOSE, CAPILLARY
GLUCOSE-CAPILLARY: 98 mg/dL (ref 65–99)
Glucose-Capillary: 135 mg/dL — ABNORMAL HIGH (ref 65–99)
Glucose-Capillary: 142 mg/dL — ABNORMAL HIGH (ref 65–99)

## 2015-09-20 LAB — BASIC METABOLIC PANEL
ANION GAP: 10 (ref 5–15)
BUN: 18 mg/dL (ref 6–20)
CALCIUM: 8.7 mg/dL — AB (ref 8.9–10.3)
CHLORIDE: 93 mmol/L — AB (ref 101–111)
CO2: 33 mmol/L — AB (ref 22–32)
CREATININE: 1.69 mg/dL — AB (ref 0.61–1.24)
GFR calc non Af Amer: 49 mL/min — ABNORMAL LOW (ref >60)
GFR, EST AFRICAN AMERICAN: 56 mL/min — AB (ref >60)
GLUCOSE: 168 mg/dL — AB (ref 65–99)
Potassium: 4.3 mmol/L (ref 3.5–5.1)
Sodium: 136 mmol/L (ref 135–145)

## 2015-09-20 LAB — MRSA PCR SCREENING: MRSA BY PCR: NEGATIVE

## 2015-09-20 LAB — TRIGLYCERIDES: TRIGLYCERIDES: 92 mg/dL (ref <150)

## 2015-09-20 MED ORDER — DOCUSATE SODIUM 50 MG/5ML PO LIQD
100.0000 mg | Freq: Two times a day (BID) | ORAL | Status: DC
  Administered 2015-09-20 (×2): 100 mg via ORAL
  Filled 2015-09-20 (×4): qty 10

## 2015-09-20 MED ORDER — SODIUM CHLORIDE 0.9 % IJ SOLN: 10.0000 mL | INTRAMUSCULAR | Status: DC | PRN

## 2015-09-20 MED ORDER — ALBUTEROL SULFATE (2.5 MG/3ML) 0.083% IN NEBU
2.5000 mg | INHALATION_SOLUTION | RESPIRATORY_TRACT | Status: DC
  Administered 2015-09-20 – 2015-09-21 (×7): 2.5 mg via RESPIRATORY_TRACT
  Filled 2015-09-20 (×8): qty 3

## 2015-09-20 MED ORDER — VITAL HIGH PROTEIN PO LIQD
1000.0000 mL | ORAL | Status: DC
  Administered 2015-09-20: 22:00:00
  Administered 2015-09-20: 1000 mL
  Administered 2015-09-20 – 2015-09-21 (×10)

## 2015-09-20 MED ORDER — DEXTROSE 5 % IV SOLN: 500.0000 mg | INTRAVENOUS | Status: DC

## 2015-09-20 MED ORDER — MIDAZOLAM HCL 2 MG/2ML IJ SOLN
INTRAMUSCULAR | Status: AC
  Administered 2015-09-20: 4 mg via INTRAVENOUS
  Filled 2015-09-20: qty 4

## 2015-09-20 MED ORDER — SCOPOLAMINE 1 MG/3DAYS TD PT72
1.0000 | MEDICATED_PATCH | TRANSDERMAL | Status: DC
  Administered 2015-09-20: 1.5 mg via TRANSDERMAL
  Filled 2015-09-20: qty 1

## 2015-09-20 MED ORDER — CALCIUM CARBONATE ANTACID 500 MG PO CHEW
1.0000 | CHEWABLE_TABLET | Freq: Three times a day (TID) | ORAL | Status: DC | PRN
  Administered 2015-09-20: 200 mg via ORAL
  Filled 2015-09-20: qty 1

## 2015-09-20 MED ORDER — FENTANYL 2500MCG IN NS 250ML (10MCG/ML) PREMIX INFUSION
20.0000 ug/h | INTRAVENOUS | Status: DC
  Administered 2015-09-20 – 2015-09-21 (×2): 250 ug/h via INTRAVENOUS
  Administered 2015-09-21: 125 ug/h via INTRAVENOUS
  Administered 2015-09-23: 62.5 ug/h via INTRAVENOUS
  Filled 2015-09-20 (×4): qty 250

## 2015-09-20 MED ORDER — VECURONIUM BROMIDE 10 MG IV SOLR
20.0000 mg | Freq: Once | INTRAVENOUS | Status: AC
  Administered 2015-09-20: 20 mg via INTRAVENOUS

## 2015-09-20 MED ORDER — FENTANYL 2500MCG IN NS 250ML (10MCG/ML) PREMIX INFUSION
10.0000 ug/h | INTRAVENOUS | Status: DC
  Administered 2015-09-20: 10 ug/h via INTRAVENOUS
  Filled 2015-09-20: qty 250

## 2015-09-20 MED ORDER — FAMOTIDINE IN NACL 20-0.9 MG/50ML-% IV SOLN
20.0000 mg | Freq: Two times a day (BID) | INTRAVENOUS | Status: DC
  Administered 2015-09-20 (×2): 20 mg via INTRAVENOUS
  Filled 2015-09-20 (×4): qty 50

## 2015-09-20 MED ORDER — DEXTROSE 5 % IV SOLN: 1.0000 g | INTRAVENOUS | Status: DC

## 2015-09-20 MED ORDER — FREE WATER
30.0000 mL | Status: DC
  Administered 2015-09-20 – 2015-09-21 (×6): 30 mL

## 2015-09-20 MED ORDER — METHYLPREDNISOLONE SODIUM SUCC 125 MG IJ SOLR
60.0000 mg | Freq: Four times a day (QID) | INTRAMUSCULAR | Status: DC
  Administered 2015-09-20 – 2015-09-21 (×3): 60 mg via INTRAVENOUS
  Filled 2015-09-20 (×3): qty 2

## 2015-09-20 MED ORDER — PROPOFOL 1000 MG/100ML IV EMUL
5.0000 ug/kg/min | INTRAVENOUS | Status: DC
  Administered 2015-09-20 (×3): 45 ug/kg/min via INTRAVENOUS
  Administered 2015-09-20: 10 ug/kg/min via INTRAVENOUS
  Administered 2015-09-20 (×4): 45 ug/kg/min via INTRAVENOUS
  Administered 2015-09-20: 35 ug/kg/min via INTRAVENOUS
  Administered 2015-09-21: 12 ug/kg/min via INTRAVENOUS
  Administered 2015-09-21: 40 ug/kg/min via INTRAVENOUS
  Administered 2015-09-21: 12 ug/kg/min via INTRAVENOUS
  Administered 2015-09-21: 38 ug/kg/min via INTRAVENOUS
  Administered 2015-09-21: 12 ug/kg/min via INTRAVENOUS
  Administered 2015-09-21: 40 ug/kg/min via INTRAVENOUS
  Administered 2015-09-22 (×3): 15 ug/kg/min via INTRAVENOUS
  Administered 2015-09-23 (×2): 10 ug/kg/min via INTRAVENOUS
  Filled 2015-09-20 (×21): qty 100

## 2015-09-20 MED ORDER — CHLORHEXIDINE GLUCONATE 0.12% ORAL RINSE (MEDLINE KIT)
15.0000 mL | Freq: Two times a day (BID) | OROMUCOSAL | Status: DC
  Administered 2015-09-20 (×2): 15 mL via OROMUCOSAL
  Filled 2015-09-20 (×5): qty 15

## 2015-09-20 MED ORDER — NOREPINEPHRINE BITARTRATE 1 MG/ML IV SOLN
0.0000 ug/min | INTRAVENOUS | Status: DC
  Administered 2015-09-20: 20 ug/min via INTRAVENOUS
  Administered 2015-09-21: 5 ug/min via INTRAVENOUS
  Filled 2015-09-20 (×2): qty 16

## 2015-09-20 MED ORDER — NOREPINEPHRINE BITARTRATE 1 MG/ML IV SOLN
0.0000 ug/min | INTRAVENOUS | Status: DC
  Administered 2015-09-20: 20 ug/min via INTRAVENOUS
  Filled 2015-09-20: qty 4

## 2015-09-20 MED ORDER — VECURONIUM BROMIDE 10 MG IV SOLR
INTRAVENOUS | Status: AC
  Administered 2015-09-20: 20 mg via INTRAVENOUS
  Filled 2015-09-20: qty 20

## 2015-09-20 MED ORDER — ENOXAPARIN SODIUM 40 MG/0.4ML ~~LOC~~ SOLN
40.0000 mg | Freq: Two times a day (BID) | SUBCUTANEOUS | Status: DC
  Administered 2015-09-20 – 2015-10-05 (×31): 40 mg via SUBCUTANEOUS
  Filled 2015-09-20 (×32): qty 0.4

## 2015-09-20 MED ORDER — MIDAZOLAM HCL 2 MG/2ML IJ SOLN
4.0000 mg | Freq: Once | INTRAMUSCULAR | Status: AC
  Administered 2015-09-20: 4 mg via INTRAVENOUS

## 2015-09-20 MED ORDER — FENTANYL CITRATE (PF) 100 MCG/2ML IJ SOLN
200.0000 ug | Freq: Once | INTRAMUSCULAR | Status: AC
Start: 2015-09-20 — End: 2015-09-20
  Administered 2015-09-20: 100 ug via INTRAVENOUS

## 2015-09-20 MED ORDER — LORAZEPAM 2 MG/ML IJ SOLN: 2.0000 mg | INTRAMUSCULAR | Status: DC | PRN

## 2015-09-20 MED ORDER — STERILE WATER FOR INJECTION IJ SOLN
INTRAMUSCULAR | Status: AC
  Administered 2015-09-20: 10 mL
  Filled 2015-09-20: qty 20

## 2015-09-20 MED ORDER — ANTISEPTIC ORAL RINSE SOLUTION (CORINZ)
7.0000 mL | Freq: Four times a day (QID) | OROMUCOSAL | Status: DC
  Administered 2015-09-20 – 2015-09-21 (×4): 7 mL via OROMUCOSAL
  Filled 2015-09-20 (×8): qty 7

## 2015-09-20 MED ORDER — FENTANYL CITRATE (PF) 100 MCG/2ML IJ SOLN
INTRAMUSCULAR | Status: AC
  Administered 2015-09-20: 100 ug via INTRAVENOUS
  Filled 2015-09-20: qty 4

## 2015-09-20 NOTE — Progress Notes (Signed)
Initial Nutrition Assessment   INTERVENTION:   EN: Received verbal order to initiate TF via OG tube from Dr. Belia Heman this am in rounds. Will initiate Vital High Protein at 87mL/hr via TF protocol with titration orders to 25mL/hr if tolerating and free water of 30mL q4hours. Will assess goal rate tomorrow as pt receiving diprivan presently and recently intubated. Will recommend making diet order NPO.   NUTRITION DIAGNOSIS:   Inadequate oral intake related to inability to eat as evidenced by NPO status. Being addressed with EN initiation.  GOAL:   Patient will meet greater than or equal to 90% of their needs  MONITOR:    (Energy Intake, Digestive System, Pulmonary Profile, Electrolyte and Renal Profile, Anthropometrics)  REASON FOR ASSESSMENT:   Ventilator, Consult Enteral/tube feeding initiation and management  ASSESSMENT:   Pt admitted with acute hypoxic and hypercapnic resp failure with acute metabolic encephalopathy from cocaine abuse leading to CHF and encephalopathy per MD note. Pt intubated this am.  Past Medical History  Diagnosis Date  . Morbid obesity (HCC)   . Tobacco abuse     Diet Order:  Diet 2 gram sodium Room service appropriate?: Yes; Fluid consistency:: Thin    Current Nutrition: NPO currently   Food/Nutrition-Related History: Recorded po intake 80-100% of meals since admission including 100% of dinner last night.   Scheduled Medications:  . albuterol  2.5 mg Nebulization Q4H  . antiseptic oral rinse  7 mL Mouth Rinse QID  . chlorhexidine gluconate  15 mL Mouth Rinse BID  . docusate  100 mg Oral BID  . enoxaparin (LOVENOX) injection  40 mg Subcutaneous Q12H  . famotidine (PEPCID) IV  20 mg Intravenous Q12H  . furosemide  40 mg Intravenous BID  . nystatin   Topical BID  . scopolamine  1 patch Transdermal Q72H  . sodium chloride  3 mL Intravenous Q12H  . sodium chloride  3 mL Intravenous Q12H    Continuous Medications:  . fentaNYL infusion  INTRAVENOUS 150 mcg/hr (09/20/15 1143)  . norepinephrine (LEVOPHED) Adult infusion 20 mcg/min (09/20/15 1034)  . propofol (DIPRIVAN) infusion 45 mcg/kg/min (09/20/15 1143)     Electrolyte/Renal Profile and Glucose Profile:   Recent Labs Lab 09/18/15 0444 09/18/15 0448 09/19/15 0436 09/20/15 0855  NA 140  --  139 136  K 3.7  --  3.9 4.3  CL 99*  --  96* 93*  CO2 36*  --  35* 33*  BUN 15  --  15 18  CREATININE 1.06  --  1.12 1.69*  CALCIUM 8.5*  --  8.4* 8.7*  MG  --  1.9  --   --   GLUCOSE 97  --  131* 168*   Protein Profile:  Recent Labs Lab 09/07/2015 2156 09/19/15 0436  ALBUMIN 3.7 3.6    Gastrointestinal Profile: Last BM:  09/16/2015   Nutrition-Focused Physical Exam Findings:  Unable to complete Nutrition-Focused physical exam at this time.    Weight Change:  Filed Weights   09/18/15 0216 09/19/15 0526 09/20/15 0505  Weight: 547 lb 6.4 oz (248.299 kg) 549 lb 1.6 oz (249.07 kg) 524 lb (237.685 kg)   RD notes per I/O chart 524lbs was a stated weight   Height:   Ht Readings from Last 1 Encounters:  09/20/15 5\' 9"  (1.753 m)    Weight:   Wt Readings from Last 1 Encounters:  09/20/15 524 lb (237.685 kg)    Ideal Body Weight:   72.7kg  BMI:  Body mass index is  77.35 kg/(m^2).  Estimated Nutritional Needs:   Kcal:  1600-1817kcals (22-25kcals/kg) using  IBW of 72.7kg  Protein:  145-181g protein (2.0-2.5g/kg) using IBW of 72.7kg  Fluid:  2181-256245mL of fluid (30-5835mL/kg)  EDUCATION NEEDS:   No education needs identified at this time   HIGH Care Level  Leda QuailAllyson Jaelee Laughter, RD, LDN Pager [phone] Weekend/On-Call Pager [phone]

## 2015-09-20 NOTE — Progress Notes (Addendum)
Called to look at pt in rm 240. Due to increased lethargic. Pt had ABg orders and placed on bipap- pt was acidotic and sent to ICU5 for intubation Dr Belia HemanKasa came in to intubate pt and  Central line placement and started the patient on levophed for BP. Report given to oncoming nurse to insert foley.

## 2015-09-20 NOTE — Progress Notes (Signed)
Informed by nursing staff - patient somnolent  Evaluated at bedside Diminished breath sounds  ABG 7.03/>128 Hypercapnic resp failure -- bipap 18/12/20/100 Recheck abg 30-2760min If required increase delta P - monitor for improvement

## 2015-09-20 NOTE — Progress Notes (Addendum)
MD Sudini assessed pt, reported that pt needed to go to ICU for intubation. Message left for Mother, Claris CheMargaret to call hospital at earliest convenience.  Belongings packed up and sent with pt to ICU. Respiratory at bedside.   Mayra NeerNesbitt, Frederica Chrestman M

## 2015-09-20 NOTE — Progress Notes (Signed)
Enoxaparin dosing for VTE prophylaxis.  Pt is 238kg, BMI >40. CrCl >100 mL/min Hgb 14.4  plt 69 (baseline) 40 mg bid ordered per protocol.  Fulton ReekMatt Menelik Mcfarren, PharmD, BCPS  09/20/2015

## 2015-09-20 NOTE — Care Management (Signed)
Patient admitted to 2A  with increasing edema and shortness of breath.  Respiratory status declined and transferred to icu.  Currently intubated and on full ventilator support.  Little is known  about patient's social situation.  Did test positive for cocaine.  Morbidly obese at 524 pounds.  Unsure if patient has HCPOA. Reaching out to contact- motherBarnie Morrow- Mitchell Morrow  - [phone]- [phone].  there are no family members or friends present

## 2015-09-20 NOTE — Procedures (Signed)
Endotracheal Intubation: Patient required placement of an artificial airway secondary to resp failure.   Consent: Emergent.   Hand washing performed prior to starting the procedure.   Medications administered for sedation prior to procedure: Midazolam 4 mg IV,  Vecuronium 20 mg IV, Fentanyl 100 mcg IV.   Procedure: A time out procedure was called and correct patient, name, & ID confirmed. Needed supplies and equipment were assembled and checked to include ETT, 10 ml syringe, Glidescope, Mac and Meeker blades, suction, oxygen and bag mask valve, end tidal CO2 monitor. Patient was positioned to align the mouth and pharynx to facilitate visualization of the glottis.  Heart rate, SpO2 and blood pressure was continuously monitored during the procedure. Pre-oxygenation was conducted prior to intubation and endotracheal tube was placed through the vocal cords into the trachea.  During intubation an assistant applied gentle pressure to the cricoid cartilage.   The artificial airway was placed under direct visualization via glidescope route using a 8.0 ETT on the first attempt.    ETT was secured at 23 cm mark.    Placement was confirmed by auscuitation of lungs with good breath sounds bilaterally and no stomach sounds.  Condensation was noted on endotracheal tube.  Pulse ox 90%.  CO2 detector in place with appropriate color change.   Complications: None .   Operator: Rafeal Skibicki.   Chest radiograph ordered and pending.   Comments: OGT placed via glidescope.  Lucie LeatherKurian David Khalani Novoa, M.D.  [facility]  Medical Director [facility] Medical Director [facility] Cardio-P[facility]

## 2015-09-20 NOTE — Progress Notes (Signed)
Rn went to check on pt, pt noticeably more lethargic than before. Pt was arousable, but unable to stay awake for long.  Blood sugar was 135.Oxygen saturations in the 80's. RN bumped O2 to 4L. O2 saturations still remained in the 80's, pt was bumped to 6L. Respiratory and MD notified. Both at bedside. Blood gas drawn. CO2 > 155. PH 7. Pt placed on BiPAP. Saturations improved. Pt still on continuous BiPAP. Saturations currently 94. Will continue to monitor.   Mayra NeerNesbitt, Alexxia Stankiewicz M

## 2015-09-20 NOTE — Procedures (Signed)
Central Venous Catheter Placement: Indication: Patient receiving vesicant or irritant drug.; Patient receiving intravenous therapy for longer than 5 days.; Patient has limited or no vascular access.   Consent:emergent  Risks and benefits explained in detail including risk of infection, bleeding, respiratory failure and death..   Hand washing performed prior to starting the procedure.   Procedure: An active timeout was performed and correct patient, name, & ID confirmed.  After explaining risk and benefits, patient was positioned correctly for central venous access. Patient was prepped using strict sterile technique including chlorohexadine preps, sterile drape, sterile gown and sterile gloves.  The area was prepped, draped and anesthetized in the usual sterile manner. Patient comfort was obtained.  A triple lumen catheter was placed in Left Internal Jugular Vein There was good blood return, catheter caps were placed on lumens, catheter flushed easily, the line was secured and a sterile dressing and BIO-PATCH applied.   Ultrasound was used to visualize vasculature and guidance of needle.   Number of Attempts: 1 Complications:none Estimated Blood Loss: none Chest Radiograph indicated and ordered.  Operator: Nino Amano.   Lucie LeatherKurian David Aanika Defoor, M.D.  [facility]  Medical Director [facility] Medical Director [facility] Card[facility]

## 2015-09-20 NOTE — Progress Notes (Signed)
[facility] - [place] at [facility]   PATIENT NAME: Mitchell Morrow    MR#:  [id number]  DATE OF BIRTH:  February 28, 1976  SUBJECTIVE:  CHIEF COMPLAINT:   Chief Complaint  Patient presents with  . Leg Swelling    Came with SOB, found having Pneumonia, Was completely alert and oriented yesterday, but overnight he had drowsiness and was unarousable. Found to have CO2 retention, so intubated and transferred to ICU for further care.  REVIEW OF SYSTEMS:   Intubated on ventilatory support so cannot get review of system. ROS  DRUG ALLERGIES:  No Known Allergies  VITALS:  Blood pressure 92/62, pulse 90, temperature 98.7 F (37.1 C), temperature source Oral, resp. rate 26, height 5\' 9"  (1.753 m), weight 237.685 kg (524 lb), SpO2 96 %.  PHYSICAL EXAMINATION:  GENERAL:  40 y.o.-year-old morbidly obase patient lying in the bed on vent support. EYES: Pupils equal, round, reactive to light . No scleral icterus. Extraocular muscles intact.  HEENT: Head atraumatic, normocephalic. Oropharynx and nasopharynx clear.  NECK:  Supple, no jugular venous distention. No thyroid enlargement, no tenderness.  LUNGS: Normal breath sounds bilaterally,some wheezing,some crepitation. No use of accessory muscles of respiration. ET tube in place. CARDIOVASCULAR: S1, S2 normal. No murmurs, rubs, or gallops.  ABDOMEN: Soft, nontender, nondistended. Bowel sounds present. No organomegaly or mass.  EXTREMITIES: severe chronic pedal edema,no cyanosis, or clubbing.  NEUROLOGIC: sedated on vent PSYCHIATRIC: The patient is sedated.  SKIN: chronic skin fold fungal infections.  Physical Exam LABORATORY PANEL:   CBC  Recent Labs Lab 09/19/15 0436  WBC 6.8  HGB 14.4  HCT 49.0  PLT 69*   ------------------------------------------------------------------------------------------------------------------  Chemistries   Recent Labs Lab 09/18/15 0448 09/19/15 0436 09/20/15 0855  NA  --  139  136  K  --  3.9 4.3  CL  --  96* 93*  CO2  --  35* 33*  GLUCOSE  --  131* 168*  BUN  --  15 18  CREATININE  --  1.12 1.69*  CALCIUM  --  8.4* 8.7*  MG 1.9  --   --   AST  --  25  --   ALT  --  12*  --   ALKPHOS  --  87  --   BILITOT  --  2.8*  --    ------------------------------------------------------------------------------------------------------------------  Cardiac Enzymes  Recent Labs Lab 10-03-15 2156  TROPONINI <0.03   ------------------------------------------------------------------------------------------------------------------  RADIOLOGY:  Dg Chest Port 1 View  09/20/2015  CLINICAL DATA:  40 year old male with endotracheal tube, NG tube and central line placement. Shortness of breath. EXAM: PORTABLE CHEST 1 VIEW COMPARISON:  10-03-2015 FINDINGS: An endotracheal tube with tip 2.7 cm above the carina, left IJ central venous catheter extending over the mediastinum but tip very difficult to identify, and NG tube entering the stomach with tip off the field of view. Cardiomegaly, bilateral airspace disease/atelectasis and pulmonary vascular congestion again noted. There is no evidence of pneumothorax. IMPRESSION: Support apparatus as described with endotracheal tube tip 2.7 cm above the carina and NG tube entering the stomach. Left IJ central venous catheter tip very difficult to identify. No evidence of pneumothorax. Cardiomegaly, bilateral airspace disease/ atelectasis in pulmonary vascular congestion again noted. Electronically Signed   By: Harmon Pier M.D.   On: 09/20/2015 07:16   Dg Abd Portable 1v  09/20/2015  CLINICAL DATA:  40 year old male with NG tube placement. Subsequent encounter. EXAM: PORTABLE ABDOMEN - 1 VIEW COMPARISON:  09/20/2015 chest x-ray.  FINDINGS: Exam limited by patient's habitus. Nasogastric tube can only be seen to the level of the gastroesophageal junction. Further evaluation limited because of habitus and motion. IMPRESSION: Nasogastric tube can only  be seen to the level of the gastroesophageal junction. Further evaluation limited because of habitus and motion. Electronically Signed   By: Lacy DuverneySteven  Olson M.D.   On: 09/20/2015 09:22    ASSESSMENT AND PLAN:   Active Problems:   Pneumonia   Acute respiratory failure (HCC)   CHF (congestive heart failure) (HCC)   Acute encephalopathy   Cocaine abuse  * Acute hypercapnic respiratory failure Intubated on ventilator support in ICU.  Pulmonary is on the case.   * Bilateral community-acquired pneumonia Was on IV Levaquin. Send for urine streptococcus and legionella antigens. Influenza negative. Oxygen to keep saturations over 90%. Albuterol nebulizer when necessary.  likely has underlying COPD with his smoking history. He does have wheezing , on IV Solu-Medrol. Wean oxygen as tolerated. He also likely has underlying sleep apnea   As per pulmonary he does not seems like having infection, so he suggested to stop all antibiotic.  * New onset congestive heart failure, systolic as per Echo- EF 45%  - has elevated BNP, edema and vascular congestion on chest x-ray  Started IV Lasix 40 mg twice a day. Monitor input and output along with daily weight. Repeat potassium and replace as needed.  * Acute renal failure  Slight worsening in renal function, continue monitoring.  * Cutaneous candidiasis nystatin powder  * Morbid obesity will need bariatric referral as outpatient  * Thrombocytopenia - without acute bleeding  etiology unclear. No old labs available. No aspirin, heparin or Lovenox. Pending hepatitis panel, negative HIV. Could also be due to acute infection. Consult hematology if no improvement. No fever or mental status change or renal failure.  * Hyperbilirubinemia could be passive congestion from congestive heart failure. hepatitis panel and ultrasound of right upper quadrant suggestive of cirrhotic changes.  * Tobacco abuse counseled to quit smoking for greater than 3  minutes.   * DVT prophylaxis with SCDs. No heparin or Lovenox due to thrombocytopenia.    All the records are reviewed and case discussed with Care Management/Social Workerr. Management plans discussed with the patient, family and they are in agreement.  CODE STATUS: full.  TOTAL TIME TAKING CARE OF THIS PATIENT: 35 critical care minutes.  I tried calling the number available in the chart for his mother twice today and I'm not successful to reach to anyone, and there is no voicemail set up for that number.  POSSIBLE D/C IN 3-4 DAYS, DEPENDING ON CLINICAL CONDITION.   Altamese DillingVACHHANI, Adonis Yim M.D on 09/20/2015   Between 7am to 6pm - Pager - [phone]  After 6pm go to [email or web address] - password [email or web address] [facility]  [facility] [place] [facility]  Office  [phone]  CC: Primary care physician; No PCP Per Patient  Note: This dictation was prepared with Dragon dictation along with smaller phrase technology. Any transcriptional errors that result from this process are unintentional.

## 2015-09-20 NOTE — Consult Note (Signed)
[facility] Consultation      Name: Mitchell Morrow MRN: [id number] DOB: Nov 19, 1975    ADMISSION DATE:  09/08/2015    CHIEF COMPLAINT:   Acute resp failure   HISTORY OF PRESENT ILLNESS  40 yo AAF morbidly obese 524 pounds with h/o CHF and h/o OSA/OHS noncompliant with acute resp failure Patient with severe hypercapnic resp failure-tried on biPAP and failed Upon arrival to ICU, patient obtunded with GCS<8  Patient emergently intubated,sedated ROS unobtainable, ETT and central line placed emergently +Cocaine Use Central  +cocoaine   PAST MEDICAL HISTORY    :  Past Medical History  Diagnosis Date  . Morbid obesity ([place])   . Tobacco abuse    History reviewed. No pertinent past surgical history. Prior to Admission medications   Not on File   No Known Allergies   FAMILY HISTORY   Family History  Problem Relation Age of Onset  . Diabetes Other       SOCIAL HISTORY    reports that he has been smoking.  He does not have any smokeless tobacco history on file. He reports that he does not drink alcohol. His drug history is not on file.  Review of Systems  Unable to perform ROS: critical illness      VITAL SIGNS    Temp:  [97.5 F (36.4 C)-98.4 F (36.9 C)] 97.7 F (36.5 C) (01/01 1938) Pulse Rate:  [92-136] 108 (01/02 0112) Resp:  [18-19] 18 (01/01 1938) BP: (89-106)/(55-80) 103/80 mmHg (01/02 0112) SpO2:  [90 %-99 %] 91 % (01/02 0620) FiO2 (%):  [50 %] 50 % (01/02 0620) Weight:  [524 lb (237.685 kg)] 524 lb (237.685 kg) (01/02 0505) HEMODYNAMICS:   VENTILATOR SETTINGS: Vent Mode:  [-] PRVC FiO2 (%):  [50 %] 50 % Set Rate:  [20 bmp] 20 bmp Vt Set:  [500 mL] 500 mL PEEP:  [5 cmH20] 5 cmH20 INTAKE / OUTPUT:  Intake/Output Summary (Last 24 hours) at 09/20/15 3299 Last data filed at 09/19/15 1700  Gross per 24 hour  Intake    240 ml  Output      0 ml  Net    240 ml       PHYSICAL EXAM   Physical Exam    Constitutional: He appears distressed.  HENT:  Head: Normocephalic and atraumatic.  Eyes: Pupils are equal, round, and reactive to light. No scleral icterus.  Neck: Normal range of motion. Neck supple.  Cardiovascular: Normal rate and regular rhythm.   No murmur heard. Pulmonary/Chest: He is in respiratory distress. He has no wheezes. He has rales.  resp distress  Abdominal: Soft. He exhibits no distension. There is no tenderness.  Musculoskeletal: He exhibits no edema.  Neurological: He displays normal reflexes. Coordination normal.  gcs<8T  Skin: Skin is warm. No rash noted. He is diaphoretic.       LABS   LABS:  CBC  Recent Labs Lab 09/15/2015 2156 09/18/15 0444 09/19/15 0436  WBC 7.4 7.0 6.8  HGB 15.7 14.6 14.4  HCT 51.0 47.8 49.0  PLT 74* 69* 69*   Coag's No results for input(s): APTT, INR in the last 168 hours. BMET  Recent Labs Lab 08/28/2015 2156 09/18/15 0444 09/19/15 0436  NA 138 140 139  K 4.1 3.7 3.9  CL 98* 99* 96*  CO2 33* 36* 35*  BUN _0 CREATININE 1.15 1.06 1.12  GLUCOSE 104* 97 131*   Electrolytes  Recent Labs Lab 09/10/2015 2156 09/18/15 0444 09/18/15  0448 09/19/15 0436  CALCIUM 8.7* 8.5*  --  8.4*  MG  --   --  1.9  --    Sepsis Markers No results for input(s): LATICACIDVEN, PROCALCITON, O2SATVEN in the last 168 hours. ABG  Recent Labs Lab 09/20/15 0250 09/20/15 0400  PHART 7.03* 7.04*  PCO2ART 155* 156*  PO2ART 53* 68*   Liver Enzymes  Recent Labs Lab 09/12/2015 2156 09/19/15 0436  AST 27 25  ALT 12* 12*  ALKPHOS 93 87  BILITOT 2.8* 2.8*  ALBUMIN 3.7 3.6   Cardiac Enzymes  Recent Labs Lab 09/07/2015 2156  TROPONINI <0.03   Glucose  Recent Labs Lab 09/20/15 0230 09/20/15 0502  GLUCAP 135* 142*     Recent Results (from the past 240 hour(s))  Blood culture (routine x 2)     Status: None (Preliminary result)   Collection Time: 09/14/2015 11:53 PM  Result Value Ref Range Status   Specimen  Description BLOOD LEFT HAND  Final   Special Requests BOTTLES DRAWN AEROBIC AND ANAEROBIC 4CC  Final   Culture NO GROWTH 1 DAY  Final   Report Status PENDING  Incomplete  Blood culture (routine x 2)     Status: None (Preliminary result)   Collection Time: 08/26/2015 11:53 PM  Result Value Ref Range Status   Specimen Description BLOOD LEFT ARM  Final   Special Requests BOTTLES DRAWN AEROBIC AND ANAEROBIC 5CC  Final   Culture NO GROWTH 1 DAY  Final   Report Status PENDING  Incomplete  Urine culture     Status: None   Collection Time: 09/18/15 12:21 AM  Result Value Ref Range Status   Specimen Description URINE, RANDOM  Final   Special Requests NONE  Final   Culture MULTIPLE SPECIES PRESENT, SUGGEST RECOLLECTION  Final   Report Status 09/19/2015 FINAL  Final  Rapid Influenza A&B Antigens ([facility] only)     Status: None   Collection Time: 09/18/15  6:26 AM  Result Value Ref Range Status   Influenza A ([facility]) NOT DETECTED  Final   Influenza B ([facility]) NOT DETECTED  Final     Current facility-administered medications:  .  0.9 %  sodium chloride infusion, 250 mL, Intravenous, PRN, Hillary Bow, MD .  acetaminophen (TYLENOL) tablet 650 mg, 650 mg, Oral, Q6H PRN **OR** acetaminophen (TYLENOL) suppository 650 mg, 650 mg, Rectal, Q6H PRN, Srikar Sudini, MD .  albuterol (PROVENTIL) (2.5 MG/3ML) 0.083% nebulizer solution 2.5 mg, 2.5 mg, Nebulization, Q2H PRN, Srikar Sudini, MD .  bisacodyl (DULCOLAX) EC tablet 5 mg, 5 mg, Oral, Daily PRN, Hillary Bow, MD .  calcium carbonate (TUMS - dosed in mg elemental calcium) chewable tablet 200 mg of elemental calcium, 1 tablet, Oral, TID PRN, Lytle Butte, MD, 200 mg of elemental calcium at 09/20/15 0130 .  docusate sodium (COLACE) capsule 100 mg, 100 mg, Oral, BID, Hillary Bow, MD, 100 mg at 09/18/15 2227 .  fentaNYL (SUBLIMAZE) 100 MCG/2ML injection, , , ,  .  fentaNYL (SUBLIMAZE) injection 200 mcg, 200 mcg, Intravenous, Once, Flora Lipps, MD .  fentaNYL  2566mg in NS 2574m(1012mml) infusion-PREMIX, 10 mcg/hr, Intravenous, Continuous, KurFlora LippsD .  furosemide (LASIX) injection 40 mg, 40 mg, Intravenous, BID, SriHillary BowD, 40 mg at 09/19/15 1717 .  guaiFENesin (ROBITUSSIN) 100 MG/5ML solution 200 mg, 10 mL, Oral, Q4H PRN, DavLytle ButteD, 200 mg at 09/19/15 0016 .  levofloxacin (LEVAQUIN) tablet 750 mg, 750 mg, Oral, Daily, SriHillary BowD .  midazolam (  VERSED) 2 MG/2ML injection, , , ,  .  midazolam (VERSED) injection 4 mg, 4 mg, Intravenous, Once, Flora Lipps, MD .  norepinephrine (LEVOPHED) 4 mg in dextrose 5 % 250 mL (0.016 mg/mL) infusion, 0-40 mcg/min, Intravenous, Titrated, Flora Lipps, MD .  nystatin (MYCOSTATIN/NYSTOP) topical powder, , Topical, BID, Srikar Sudini, MD .  ondansetron (ZOFRAN) tablet 4 mg, 4 mg, Oral, Q6H PRN **OR** ondansetron (ZOFRAN) injection 4 mg, 4 mg, Intravenous, Q6H PRN, Hillary Bow, MD, 4 mg at 09/20/15 0052 .  polyethylene glycol (MIRALAX / GLYCOLAX) packet 17 g, 17 g, Oral, Daily PRN, Srikar Sudini, MD .  propofol (DIPRIVAN) 1000 MG/100ML infusion, 5-80 mcg/kg/min, Intravenous, Titrated, Flora Lipps, MD .  sodium chloride 0.9 % injection 3 mL, 3 mL, Intravenous, Q12H, Srikar Sudini, MD, 3 mL at 09/19/15 0016 .  sodium chloride 0.9 % injection 3 mL, 3 mL, Intravenous, Q12H, Hillary Bow, MD, 3 mL at 09/19/15 2202 .  sodium chloride 0.9 % injection 3 mL, 3 mL, Intravenous, PRN, Hillary Bow, MD .  sterile water (preservative free) injection, , , ,  .  vecuronium (NORCURON) 10 MG injection, , , ,  .  vecuronium (NORCURON) injection 20 mg, 20 mg, Intravenous, Once, Flora Lipps, MD  IMAGING    No results found.    Indwelling Urinary Catheter continued, requirement due to   Reason to continue Indwelling Urinary Catheter for strict Intake/Output monitoring for hemodynamic instability   Central Line continued, requirement due to   Reason to continue [employer] Monitoring of central  venous pressure or other hemodynamic parameters   Ventilator continued, requirement due to, resp failure    Ventilator Sedation RASS 0 to -2   MAJOR EVENTS/TEST RESULTS: +cocaine    INDWELLING DEVICES:: ETT size 8.0 placed 1/2>> LEFT IJ CVL 1/2>>  MICRO DATA: MRSA PCR >>neg Urine  Blood Resp   ANTIMICROBIALS:     ASSESSMENT/PLAN  40 yo morbidly obese AAM with acute hypoxic and hypercapnic resp failure with acute metabolic encephalopathy from cocaine abuse leading to CHF and encephalopathy  PULMONARY -Respiratory Failure -continue Full MV support -continue Bronchodilator Therapy -Wean Fio2 and PEEP as tolerated -will perform SAT/SBt when respiratory parameters are met   CARDIOVASCULAR Shock-likely related to sedation -use pressors as needed tp keep MAP>65  RENAL Place foley, watch UO  GASTROINTESTINAL Gi prx -OG placed -start TF's  HEMATOLOGIC Follow CBC  INFECTIOUS No signs of infection at this time -will stop all abx  ENDOCRINE - ICU hypoglycemic\Hyperglycemia protocol   NEUROLOGIC-encephalopathy-likley from hypoxia and hypercapnia -will need neuro checks and assessment-if mental status does not improve with ventilation, will need to assess for acute intra-cranial process with ie. Intracranial bleed with CT head due to cocaine abuse     I have personally obtained a history, examined the patient, evaluated laboratory and independently reviewed  imaging results, formulated the assessment and plan and placed orders.  The Patient requires high complexity decision making for assessment and support, frequent evaluation and titration of therapies, application of advanced monitoring technologies and extensive interpretation of multiple databases. Critical Care Time devoted to patient care services described in this note is 45 minutes.   Overall, patient is critically ill, prognosis is guarded. Patient at high risk for cardiac arrest and death.     Corrin Parker, M.D.  [facility]  Medical Director [place] Director [facility] Cardio-Pulmonary Department

## 2015-09-20 NOTE — Progress Notes (Signed)
Pt reported that he was feeling nauseous. RN administered Zofran. Pt started having complaints of "not feeling right". He states that he was having difficulty breathing, and some chest pain that was rated 8/10. Vital signs Nursing staff sat pt upright, and pt stated that he was feeling better. Vital signs stable. Chest discomfort now rated 6/10. Pt states he believes "its just indigestion". MD Hower notified.  Tums ordered and administered by RN. Will continue to monitor.   Mayra NeerNesbitt, Adelie Croswell M

## 2015-09-20 NOTE — Progress Notes (Signed)
Patient had change in mental status and was placed on Bipap due significantly elevated PCO2 of 155. Repeat ABG after 1 hour still shows PCO2 at 158.   On reevaluation of patient he is extremely drowsy and so his eyes with a sternal rub. Poor air entry bilaterally. Lower extremity edema Heartrate at 111  Telemetry reviewed  Patient with his morbid obesity likely has severe sleep apnea and may have a baseline PCO2 of  100-105 mm hg. Presently with acute hypoxic and hypercarbic respiratory failure with pH of 7.04 and PCO2 of 158 he will need intubation for full ventilatory support . I have discussed with Dr. Hosie PoissonSumner of [facility]. Continue antibiotics and Lasix.  Due to morbid obesity will request on-call anesthesiologist to intubate the patient once he arrives in the ICU.  Called patient's mother but no answer on the phone.  Total critical care time spent was 35 minutes

## 2015-09-21 DIAGNOSIS — N179 Acute kidney failure, unspecified: Secondary | ICD-10-CM

## 2015-09-21 DIAGNOSIS — I959 Hypotension, unspecified: Secondary | ICD-10-CM

## 2015-09-21 DIAGNOSIS — E662 Morbid (severe) obesity with alveolar hypoventilation: Secondary | ICD-10-CM

## 2015-09-21 DIAGNOSIS — J9622 Acute and chronic respiratory failure with hypercapnia: Secondary | ICD-10-CM

## 2015-09-21 LAB — GLUCOSE, CAPILLARY
GLUCOSE-CAPILLARY: 118 mg/dL — AB (ref 65–99)
GLUCOSE-CAPILLARY: 144 mg/dL — AB (ref 65–99)
GLUCOSE-CAPILLARY: 147 mg/dL — AB (ref 65–99)
GLUCOSE-CAPILLARY: 155 mg/dL — AB (ref 65–99)
Glucose-Capillary: 150 mg/dL — ABNORMAL HIGH (ref 65–99)
Glucose-Capillary: 168 mg/dL — ABNORMAL HIGH (ref 65–99)
Glucose-Capillary: 157 mg/dL — ABNORMAL HIGH (ref 65–99)

## 2015-09-21 LAB — CBC
HCT: 43.9 % (ref 40.0–52.0)
HEMOGLOBIN: 14 g/dL (ref 13.0–18.0)
MCH: 25.9 pg — AB (ref 26.0–34.0)
MCHC: 31.8 g/dL — ABNORMAL LOW (ref 32.0–36.0)
MCV: 81.7 fL (ref 80.0–100.0)
Platelets: 62 10*3/uL — ABNORMAL LOW (ref 150–440)
RBC: 5.38 MIL/uL (ref 4.40–5.90)
RDW: 19.5 % — ABNORMAL HIGH (ref 11.5–14.5)
WBC: 7.8 10*3/uL (ref 3.8–10.6)

## 2015-09-21 LAB — BASIC METABOLIC PANEL
ANION GAP: 9 (ref 5–15)
BUN: 21 mg/dL — AB (ref 6–20)
CHLORIDE: 94 mmol/L — AB (ref 101–111)
CO2: 35 mmol/L — ABNORMAL HIGH (ref 22–32)
Calcium: 8.4 mg/dL — ABNORMAL LOW (ref 8.9–10.3)
Creatinine, Ser: 1.55 mg/dL — ABNORMAL HIGH (ref 0.61–1.24)
GFR calc Af Amer: >60 mL/min (ref >60)
GFR, EST NON AFRICAN AMERICAN: 54 mL/min — AB (ref >60)
GLUCOSE: 146 mg/dL — AB (ref 65–99)
POTASSIUM: 3.5 mmol/L (ref 3.5–5.1)
Sodium: 138 mmol/L (ref 135–145)

## 2015-09-21 MED ORDER — ANTISEPTIC ORAL RINSE SOLUTION (CORINZ)
7.0000 mL | OROMUCOSAL | Status: DC
  Administered 2015-09-21 – 2015-10-18 (×269): 7 mL via OROMUCOSAL
  Filled 2015-09-21 (×280): qty 7

## 2015-09-21 MED ORDER — BUDESONIDE 0.25 MG/2ML IN SUSP
0.2500 mg | Freq: Four times a day (QID) | RESPIRATORY_TRACT | Status: DC
  Administered 2015-09-21 – 2015-10-18 (×108): 0.25 mg via RESPIRATORY_TRACT
  Filled 2015-09-21 (×105): qty 2

## 2015-09-21 MED ORDER — FAMOTIDINE 20 MG PO TABS
20.0000 mg | ORAL_TABLET | Freq: Two times a day (BID) | ORAL | Status: DC
  Administered 2015-09-21 – 2015-09-22 (×3): 20 mg
  Filled 2015-09-21 (×3): qty 1

## 2015-09-21 MED ORDER — DOCUSATE SODIUM 50 MG/5ML PO LIQD
100.0000 mg | Freq: Two times a day (BID) | ORAL | Status: DC
  Administered 2015-09-21 – 2015-09-28 (×14): 100 mg
  Filled 2015-09-21 (×12): qty 10

## 2015-09-21 MED ORDER — POTASSIUM CHLORIDE 20 MEQ/15ML (10%) PO SOLN
20.0000 meq | Freq: Two times a day (BID) | ORAL | Status: DC
  Administered 2015-09-21: 20 meq via ORAL
  Filled 2015-09-21: qty 15

## 2015-09-21 MED ORDER — VITAL HIGH PROTEIN PO LIQD
1000.0000 mL | ORAL | Status: DC
  Administered 2015-09-21 – 2015-09-22 (×2): 1000 mL

## 2015-09-21 MED ORDER — POTASSIUM CHLORIDE 20 MEQ/15ML (10%) PO SOLN
20.0000 meq | ORAL | Status: DC
  Administered 2015-09-21: 20 meq
  Filled 2015-09-21 (×2): qty 15

## 2015-09-21 MED ORDER — IPRATROPIUM-ALBUTEROL 0.5-2.5 (3) MG/3ML IN SOLN
3.0000 mL | Freq: Four times a day (QID) | RESPIRATORY_TRACT | Status: DC
  Administered 2015-09-21 – 2015-10-18 (×108): 3 mL via RESPIRATORY_TRACT
  Filled 2015-09-21 (×107): qty 3

## 2015-09-21 MED ORDER — PRO-STAT SUGAR FREE PO LIQD
30.0000 mL | ORAL | Status: DC
  Administered 2015-09-21 – 2015-09-29 (×45): 30 mL via ORAL

## 2015-09-21 MED ORDER — POTASSIUM CHLORIDE CRYS ER 20 MEQ PO TBCR: 20.0000 meq | EXTENDED_RELEASE_TABLET | Freq: Two times a day (BID) | ORAL | Status: DC

## 2015-09-21 MED ORDER — FREE WATER
200.0000 mL | Freq: Three times a day (TID) | Status: DC
  Administered 2015-09-21 – 2015-09-26 (×15): 200 mL
  Administered 2015-09-26: 100 mL
  Administered 2015-09-27 – 2015-10-04 (×22): 200 mL

## 2015-09-21 MED ORDER — CHLORHEXIDINE GLUCONATE 0.12% ORAL RINSE (MEDLINE KIT)
15.0000 mL | Freq: Two times a day (BID) | OROMUCOSAL | Status: DC
  Administered 2015-09-21 – 2015-10-18 (×55): 15 mL via OROMUCOSAL
  Filled 2015-09-21 (×57): qty 15

## 2015-09-21 MED ORDER — LORAZEPAM 2 MG/ML IJ SOLN
1.0000 mg | INTRAMUSCULAR | Status: DC | PRN
Start: 2015-09-21 — End: 2015-09-29
  Administered 2015-09-26 – 2015-09-28 (×2): 1 mg via INTRAVENOUS
  Filled 2015-09-21 (×2): qty 1

## 2015-09-21 MED ORDER — ALBUTEROL SULFATE (2.5 MG/3ML) 0.083% IN NEBU: 2.5000 mg | INHALATION_SOLUTION | RESPIRATORY_TRACT | Status: DC | PRN

## 2015-09-21 NOTE — Progress Notes (Signed)
PULMONARY/CCM PROGRESS NOTE  Active problems: Acute ventilator dependent hypercapneic respiratory failure Vasopressor dependent hypotension AKI - baseline Cr 1.06 Morbid obesity Smoker Cor pulmonale  Lines, Tubes, etc: ETT 01/02 >>  L IJ CVL 01/02 >>   Microbiology: Blood 12/30 >>  MRSA PCR 01/02 >> NEG  Antibiotics:  Levofloxacin 12/20 >> 01/01 Ceftriaxone 01/02 >> 01/02 Azithro 01/02 >> 01/02   Studies/Events: TTE 12/31 >> poor quality study, LVEF 45%  Consults:     Best Practice: DVT: enoxaparin SUP: enteral famotidine Nutrition: TFs Glycemic control: N/I Sedation/analgesia: fent/propofol infusion. PRN lorazepam    Subj: RASS -1, + F/C  Obj: Filed Vitals:   09/21/15 0600 09/21/15 0610 09/21/15 0700 09/21/15 0800  BP: 98/68 100/67 107/74 108/75  Pulse: 85 85 83 82  Temp:    98.2 F (36.8 C)  TempSrc:    Oral  Resp: 26 26 26 26   Height:      Weight:      SpO2: 95% 95% 96% 95%    Gen: morbidly obese HEENT:  WNL Neck: CVL site clean, JVP cannot be assessed Chest: coarse BS without true wheezes, somewhat prolonged exp time Cardiac: distant HS, regular, no M noted Abd: obese, soft, + BS Ext: symmetric edema, chronic stasis changes  BMET    Component Value Date/Time   NA 138 09/21/2015 0432   K 3.5 09/21/2015 0432   CL 94* 09/21/2015 0432   CO2 35* 09/21/2015 0432   GLUCOSE 146* 09/21/2015 0432   BUN 21* 09/21/2015 0432   CREATININE 1.55* 09/21/2015 0432   CALCIUM 8.4* 09/21/2015 0432   GFRNONAA 54* 09/21/2015 0432   GFRAA >60 09/21/2015 0432    CBC    Component Value Date/Time   WBC 7.8 09/21/2015 0432   RBC 5.38 09/21/2015 0432   HGB 14.0 09/21/2015 0432   HCT 43.9 09/21/2015 0432   PLT 62* 09/21/2015 0432   MCV 81.7 09/21/2015 0432   MCH 25.9* 09/21/2015 0432   MCHC 31.8* 09/21/2015 0432   RDW 19.5* 09/21/2015 0432    CXR: NNF   IMPRESSION: 1) Acute on chronic hypercarbic respiratory failure - multifactorial 2)  Obesity hypoventilation syndrome 3) Atelectasis 4) vasopressor dependent hypotension 5) Mild AKI, non-oliguric 6) Mild hyperglycemia without prior dx of DM 7) thrombocytopenia - unknown chronicity 8) ICU associated discomfort  PLAN/RECS:  Cont vent support - settings reviewed and/or adjusted Wean in PSV as tolerated Cont vent bundle Follow CXR Daily SBT if/when meets criteria Wean norepinephrine to off for MAP > 65 mmHg Monitor BMET intermittently Monitor I/Os Correct electrolytes as indicated Cont TFs Consider SSI if glu > 180 Following off abx PCT in AM 01/04 Monitor CBC intermittently Might need to consider DC LMWH if plts drop further RASS goal -1 Cont Fent/propofol with daily WUA    CCM time: 45 mins The above time includes time spent in consultation with patient and/or family members and reviewing care plan on multidisciplinary rounds  Billy Fischeravid Aramis Weil, MD PCCM service Mobile [phone] Pager [phone]

## 2015-09-21 NOTE — Care Management (Signed)
Have not received call back from patient's mother.

## 2015-09-21 NOTE — Care Management (Signed)
Have not received call back from patient's mother.  Informed that patient has not had any visitors.  There is a cell phone in his room.  Will inquire with administration if it is against privacy laws for cell phone to be searched for numbers.  Will  ask law enforcement to drive by residence 1/4 in attempts to make contact with family/friend.

## 2015-09-21 NOTE — Progress Notes (Signed)
Nutrition Follow-up    INTERVENTION:   EN: with current diprivan, recommend decreasing TF to rate of 20 ml/hr, adding Prostat 6 times daily providing 1080 kcals, 132 g of protein. Additional 1361 kcals estimated in past 24 hours via diprivan. Continue to assess  NUTRITION DIAGNOSIS:   Inadequate oral intake related to inability to eat as evidenced by NPO status.  GOAL:   Patient will meet greater than or equal to 90% of their needs  MONITOR:    (Energy Intake, Digestive System, Pulmonary Profile, Electrolyte and Renal Profile, Anthropometrics)  REASON FOR ASSESSMENT:   Ventilator, Consult Enteral/tube feeding initiation and management  ASSESSMENT:   Pt admitted with acute hypoxic and hypercapnic resp failure with acute metabolic encephalopathy from cocaine abuse leading to CHF and encephalopathy per MD note. Pt intubated this am.  Pt remains on vent  EN: tolerating Vital High Protein at rate of 40 ml/hr  Digestive System: no signs of TF intolerance  Last BM:  09/16/2015   Electrolyte and Renal Profile:  Recent Labs Lab 09/18/15 0448 09/19/15 0436 09/20/15 0855 09/21/15 0432  BUN  --  15 18 21*  CREATININE  --  1.12 1.69* 1.55*  NA  --  139 136 138  K  --  3.9 4.3 3.5  MG 1.9  --   --   --    Glucose Profile:  Recent Labs  09/21/15 0354 09/21/15 0720 09/21/15 1139  GLUCAP 150* 147* 168*   Meds: diprivan (1361 kcals in past 24 hours), levophed  Height:   Ht Readings from Last 1 Encounters:  09/20/15 5\' 9"  (1.753 m)    Weight:   Wt Readings from Last 1 Encounters:  09/21/15 531 lb 8 oz (241.087 kg)    BMI:  Body mass index is 78.45 kg/(m^2).  Estimated Nutritional Needs:   Kcal:  1600-1817kcals (22-25kcals/kg) using  IBW of 72.7kg  Protein:  145-181g protein (2.0-2.5g/kg) using IBW of 72.7kg  Fluid:  2181-254145mL of fluid (30-3535mL/kg)  EDUCATION NEEDS:   No education needs identified at this time  HIGH Care Level  Romelle Starcherate Brieana Shimmin MS,  RD, LDN [phone] Pager  [phone] Weekend/On-Call Pager

## 2015-09-21 NOTE — Progress Notes (Signed)
[facility]   PATIENT NAME: Mitchell Morrow    MR#:  [id number]  DATE OF BIRTH:  04/16/1976  SUBJECTIVE:  CHIEF COMPLAINT:   Chief Complaint  Patient presents with  . Leg Swelling    Came with SOB, found having Pneumonia, Was completely alert and oriented 09/19/15 , but overnight he had drowsiness and was unarousable. Found to have CO2 retention, so intubated 09/20/15 and transferred to ICU for further care.  remains on vent support, required levophed- but now  Off, BP stable.  REVIEW OF SYSTEMS:   Intubated on ventilatory support so cannot get review of system. ROS  DRUG ALLERGIES:  No Known Allergies  VITALS:  Blood pressure 102/68, pulse 93, temperature 98.5 F (36.9 C), temperature source Oral, resp. rate 12, height 5\' 9"  (1.753 m), weight 241.087 kg (531 lb 8 oz), SpO2 94 %.  PHYSICAL EXAMINATION:  GENERAL:  40 y.o.-year-old morbidly obase patient lying in the bed on vent support. EYES: Pupils equal, round, reactive to light . No scleral icterus. Extraocular muscles intact.  HEENT: Head atraumatic, normocephalic. Oropharynx and nasopharynx clear.  NECK:  Supple, no jugular venous distention. No thyroid enlargement, no tenderness.  LUNGS: Normal breath sounds bilaterally,some wheezing,some crepitation. No use of accessory muscles of respiration. ET tube in place. CARDIOVASCULAR: S1, S2 normal. No murmurs, rubs, or gallops.  ABDOMEN: Soft, nontender, nondistended. Bowel sounds present. No organomegaly or mass.  EXTREMITIES: severe chronic pedal edema,no cyanosis, or clubbing.  NEUROLOGIC: sedated on vent PSYCHIATRIC: The patient is sedated.  SKIN: chronic skin fold fungal infections.  Physical Exam LABORATORY PANEL:   CBC  Recent Labs Lab 09/21/15 0432  WBC 7.8  HGB 14.0  HCT 43.9  PLT 62*    ------------------------------------------------------------------------------------------------------------------  Chemistries   Recent Labs Lab 09/18/15 0448 09/19/15 0436  09/21/15 0432  NA  --  139  < > 138  K  --  3.9  < > 3.5  CL  --  96*  < > 94*  CO2  --  35*  < > 35*  GLUCOSE  --  131*  < > 146*  BUN  --  15  < > 21*  CREATININE  --  1.12  < > 1.55*  CALCIUM  --  8.4*  < > 8.4*  MG 1.9  --   --   --   AST  --  25  --   --   ALT  --  12*  --   --   ALKPHOS  --  87  --   --   BILITOT  --  2.8*  --   --   < > = values in this interval not displayed. ------------------------------------------------------------------------------------------------------------------  Cardiac Enzymes  Recent Labs Lab 09/10/2015 2156  TROPONINI <0.03   ------------------------------------------------------------------------------------------------------------------  RADIOLOGY:  Dg Chest Port 1 View  09/20/2015  CLINICAL DATA:  40 year old male with endotracheal tube, NG tube and central line placement. Shortness of breath. EXAM: PORTABLE CHEST 1 VIEW COMPARISON:  09/16/2015 FINDINGS: An endotracheal tube with tip 2.7 cm above the carina, left IJ central venous catheter extending over the mediastinum but tip very difficult to identify, and NG tube entering the stomach with tip off the field of view. Cardiomegaly, bilateral airspace disease/atelectasis and pulmonary vascular congestion again noted. There is no evidence of pneumothorax. IMPRESSION: Support apparatus as described with endotracheal tube tip 2.7 cm above the carina and NG tube entering the stomach. Left IJ central venous catheter  tip very difficult to identify. No evidence of pneumothorax. Cardiomegaly, bilateral airspace disease/ atelectasis in pulmonary vascular congestion again noted. Electronically Signed   By: Harmon Pier M.D.   On: 09/20/2015 07:16   Dg Abd Portable 1v  09/20/2015  CLINICAL DATA:  40 year old male with NG tube  placement. Subsequent encounter. EXAM: PORTABLE ABDOMEN - 1 VIEW COMPARISON:  09/20/2015 chest x-ray. FINDINGS: Exam limited by patient's habitus. Nasogastric tube can only be seen to the level of the gastroesophageal junction. Further evaluation limited because of habitus and motion. IMPRESSION: Nasogastric tube can only be seen to the level of the gastroesophageal junction. Further evaluation limited because of habitus and motion. Electronically Signed   By: Lacy Duverney M.D.   On: 09/20/2015 09:22    ASSESSMENT AND PLAN:   Active Problems:   Pneumonia   Acute respiratory failure (HCC)   CHF (congestive heart failure) (HCC)   Acute encephalopathy   Cocaine abuse  * Acute hypercapnic respiratory failure- due to sleep apnea. Intubated on ventilator support in ICU.  Pulmonary is on the case.   * Bilateral community-acquired pneumonia- ruled out by pulmonary team. Was on IV Levaquin. Send for urine streptococcus and legionella antigens. Influenza negative.  Albuterol nebulizer when necessary.  likely has underlying COPD with his smoking history. He does have wheezing , on IV Solu-Medrol.  He also likely has underlying sleep apnea   As per pulmonary he does not seems like having infection, so he suggested to stop all antibiotic.  * New onset congestive heart failure, systolic as per Echo- EF 45%  - has elevated BNP, edema and vascular congestion on chest x-ray  Started IV Lasix 40 mg twice a day.  Monitor input and output along with daily weight. Repeat potassium and replace as needed.  * Acute renal failure  Slight worsening in renal function, continue monitoring.  * Cutaneous candidiasis nystatin powder  * Morbid obesity will need bariatric referral as outpatient  * Thrombocytopenia - without acute bleeding  etiology unclear. No old labs available.  No aspirin, heparin or Lovenox.  negative hepatitis panel, negative HIV. Could also be due to acute infection. Consult  hematology if no improvement. No fever or mental status change or renal failure.   May have to stop anticoagulants- if continue to drop.  * Hyperbilirubinemia could be passive congestion from congestive heart failure. hepatitis panel and ultrasound of right upper quadrant suggestive of cirrhotic changes.  * Tobacco abuse counseled to quit smoking for greater than 3 minutes.   * DVT prophylaxis with SCDs. No heparin or Lovenox due to thrombocytopenia.    All the records are reviewed and case discussed with Care Management/Social Workerr. Management plans discussed with the patient, family and they are in agreement.  CODE STATUS: full.  TOTAL TIME TAKING CARE OF THIS PATIENT: 35 critical care minutes.  I tried calling the number available in the chart but not successful to reach to anyone, and there is no voicemail set up for that number.  spoke to Case manager to help with this issue as we may need to make decisions for him soon.  WIll keep trying to contact any family member, he told me - he lives with a girl friend on second floor of some apartment. CM to help.  POSSIBLE D/C IN 3-4 DAYS, DEPENDING ON CLINICAL CONDITION.   Altamese Dilling M.D on 09/21/2015   Between 7am to 6pm - Pager - [phone]  After 6pm go to [email or web address] - password [email or web address]  [facility]  Office  [phone]  CC: Primary care physician; No PCP Per Patient  Note: This dictation was prepared with Dragon dictation along with smaller phrase technology. Any transcriptional errors that result from this process are unintentional.

## 2015-09-21 NOTE — Progress Notes (Signed)
Dr. Sung AmabileSimonds notified pt was in Ventricular Bigeminy last approximately 3 minutes. No orders received. Rhythm acknowledge.

## 2015-09-21 NOTE — Progress Notes (Signed)
[facility] FOR AM LAB REPLACEMENT ONLY  The [facility] does [facility] based on the criteria listed below:   1. Is GFR >/= 40 ml/min? Yes.    Patient's GFR today is >60 2. Is urine output >/= 0.5 ml/kg/hr for the last 6 hours? Yes.   Patient's UOP is 0.5 ml/kg/hr 3. Is BUN < 60 mg/dL? Yes.    Patient's BUN today is 21 4. Abnormal electrolyte(s): K 3.5 5. Ordered repletion with: per protocol 6. If a panic level lab has been reported, has the CCM MD in charge been notified? No. Physician:    Markus DaftWHELAN, Tiffney Haughton A 09/21/2015 5:47 AM

## 2015-09-22 ENCOUNTER — Inpatient Hospital Stay: Payer: Medicaid Other

## 2015-09-22 DIAGNOSIS — J9621 Acute and chronic respiratory failure with hypoxia: Secondary | ICD-10-CM

## 2015-09-22 LAB — GLUCOSE, CAPILLARY
GLUCOSE-CAPILLARY: 139 mg/dL — AB (ref 65–99)
GLUCOSE-CAPILLARY: 128 mg/dL — AB (ref 65–99)
Glucose-Capillary: 123 mg/dL — ABNORMAL HIGH (ref 65–99)
Glucose-Capillary: 111 mg/dL — ABNORMAL HIGH (ref 65–99)
Glucose-Capillary: 102 mg/dL — ABNORMAL HIGH (ref 65–99)

## 2015-09-22 LAB — COMPREHENSIVE METABOLIC PANEL
ALK PHOS: 71 U/L (ref 38–126)
ALT: 10 U/L — AB (ref 17–63)
ANION GAP: 7 (ref 5–15)
AST: 19 U/L (ref 15–41)
Albumin: 3.2 g/dL — ABNORMAL LOW (ref 3.5–5.0)
BUN: 31 mg/dL — ABNORMAL HIGH (ref 6–20)
CALCIUM: 8.7 mg/dL — AB (ref 8.9–10.3)
CHLORIDE: 94 mmol/L — AB (ref 101–111)
CO2: 37 mmol/L — ABNORMAL HIGH (ref 22–32)
CREATININE: 1.93 mg/dL — AB (ref 0.61–1.24)
GFR, EST AFRICAN AMERICAN: 48 mL/min — AB (ref >60)
GFR, EST NON AFRICAN AMERICAN: 41 mL/min — AB (ref >60)
Glucose, Bld: 135 mg/dL — ABNORMAL HIGH (ref 65–99)
Potassium: 4.4 mmol/L (ref 3.5–5.1)
Sodium: 138 mmol/L (ref 135–145)
Total Bilirubin: 3.5 mg/dL — ABNORMAL HIGH (ref 0.3–1.2)
Total Protein: 7.3 g/dL (ref 6.5–8.1)

## 2015-09-22 LAB — CBC
HCT: 44.6 % (ref 40.0–52.0)
Hemoglobin: 13.8 g/dL (ref 13.0–18.0)
MCH: 25.5 pg — AB (ref 26.0–34.0)
MCHC: 30.9 g/dL — AB (ref 32.0–36.0)
MCV: 82.6 fL (ref 80.0–100.0)
PLATELETS: 77 10*3/uL — AB (ref 150–440)
RBC: 5.4 MIL/uL (ref 4.40–5.90)
RDW: 19.4 % — ABNORMAL HIGH (ref 11.5–14.5)
WBC: 8.8 10*3/uL (ref 3.8–10.6)

## 2015-09-22 LAB — TROPONIN I

## 2015-09-22 LAB — PROCALCITONIN: PROCALCITONIN: 0.74 ng/mL

## 2015-09-22 MED ORDER — FUROSEMIDE 10 MG/ML IJ SOLN: 80.0000 mg | Freq: Once | INTRAMUSCULAR | Status: DC

## 2015-09-22 MED ORDER — FUROSEMIDE 10 MG/ML IJ SOLN
80.0000 mg | Freq: Once | INTRAMUSCULAR | Status: AC
  Administered 2015-09-22: 80 mg via INTRAVENOUS
  Filled 2015-09-22: qty 8

## 2015-09-22 MED ORDER — FUROSEMIDE 10 MG/ML IJ SOLN
40.0000 mg | Freq: Once | INTRAMUSCULAR | Status: AC
  Administered 2015-09-22: 40 mg via INTRAVENOUS
  Filled 2015-09-22: qty 4

## 2015-09-22 MED ORDER — SODIUM CHLORIDE 0.9 % IV BOLUS (SEPSIS)
500.0000 mL | Freq: Once | INTRAVENOUS | Status: AC
  Administered 2015-09-22: 500 mL via INTRAVENOUS

## 2015-09-22 MED ORDER — LEVOFLOXACIN IN D5W 750 MG/150ML IV SOLN
750.0000 mg | INTRAVENOUS | Status: DC
  Administered 2015-09-22: 750 mg via INTRAVENOUS
  Filled 2015-09-22 (×2): qty 150

## 2015-09-22 MED ORDER — PHENYLEPHRINE HCL 10 MG/ML IJ SOLN
0.0000 ug/min | INTRAVENOUS | Status: DC
  Administered 2015-09-22: 20 ug/min via INTRAVENOUS
  Filled 2015-09-22: qty 1

## 2015-09-22 MED ORDER — FAMOTIDINE 20 MG PO TABS
20.0000 mg | ORAL_TABLET | Freq: Every day | ORAL | Status: DC
  Administered 2015-09-23 – 2015-09-27 (×5): 20 mg
  Filled 2015-09-22 (×6): qty 1

## 2015-09-22 MED ORDER — NOREPINEPHRINE BITARTRATE 1 MG/ML IV SOLN
0.0000 ug/min | INTRAVENOUS | Status: DC
  Administered 2015-09-23 – 2015-09-26 (×2): 5 ug/min via INTRAVENOUS
  Filled 2015-09-22 (×2): qty 16

## 2015-09-22 MED ORDER — POLYETHYLENE GLYCOL 3350 17 G PO PACK
17.0000 g | PACK | Freq: Every day | ORAL | Status: DC | PRN
  Administered 2015-09-24 – 2015-09-28 (×2): 17 g
  Filled 2015-09-22: qty 1

## 2015-09-22 MED ORDER — FUROSEMIDE 10 MG/ML IJ SOLN: 20.0000 mg | Freq: Two times a day (BID) | INTRAMUSCULAR | Status: DC

## 2015-09-22 NOTE — Progress Notes (Signed)
Received a call from a woman stating she was the patient's sister. She stated that the patient was suppose to be discharged today and was inquiring about his health. There was a lot of feedback and static on the phone as the conversation carried on. She was advised that the patient is in ICU and the doctor was asking for a family member to be present. She left her phone number and gave a phone number for the patient's mother. Jenean LindauColetta (stated sister) number [phone]. Mother's number is [phone]. Called case Building surveyormanager Nann and relayed conversation to her.

## 2015-09-22 NOTE — Progress Notes (Signed)
PULMONARY/CCM PROGRESS NOTE  Active problems: Acute ventilator dependent hypercapneic respiratory failure Vasopressor dependent hypotension AKI - baseline Cr 1.06 Morbid obesity Smoker Cor pulmonale Hyperbilirubinemia Thrombocytopenia   Lines, Tubes, etc: ETT 01/02 >>  L IJ CVL 01/02 >>   Microbiology: Blood 12/30 >>  MRSA PCR 01/02 >> NEG Resp 01/04 >>   PCT 01/04: 0.74  Antibiotics:  Levofloxacin 12/30 >> 01/01 Ceftriaxone 01/02 >> 01/02 Azithro 01/02 >> 01/02 Levofloxacin 01/04 >>    Studies/Events: TTE 12/31 >> poor quality study, LVEF 45%  Consults:     Best Practice: DVT: enoxaparin SUP: enteral famotidine Nutrition: TFs Glycemic control: N/I Sedation/analgesia: fent/propofol infusion. PRN lorazepam    Subj: RASS -1, + F/C on WUA  Obj: Filed Vitals:   09/22/15 0800 09/22/15 0900 09/22/15 1000 09/22/15 1100  BP: 94/75 97/64 97/60  94/68  Pulse: 109 109 107 111  Temp:      TempSrc:      Resp: 16 16 16 16   Height:      Weight:      SpO2: 96% 95% 95% 96%    Gen: morbidly obese HEENT:  WNL Neck: CVL site clean, JVP cannot be assessed Chest: coarse BS without true wheezes, somewhat prolonged exp time Cardiac: distant HS, regular, no M noted Abd: obese, soft, + BS Ext: symmetric edema, chronic stasis changes  BMET    Component Value Date/Time   NA 138 09/22/2015 0318   K 4.4 09/22/2015 0318   CL 94* 09/22/2015 0318   CO2 37* 09/22/2015 0318   GLUCOSE 135* 09/22/2015 0318   BUN 31* 09/22/2015 0318   CREATININE 1.93* 09/22/2015 0318   CALCIUM 8.7* 09/22/2015 0318   GFRNONAA 41* 09/22/2015 0318   GFRAA 48* 09/22/2015 0318    CBC    Component Value Date/Time   WBC 8.8 09/22/2015 0318   RBC 5.40 09/22/2015 0318   HGB 13.8 09/22/2015 0318   HCT 44.6 09/22/2015 0318   PLT 77* 09/22/2015 0318   MCV 82.6 09/22/2015 0318   MCH 25.5* 09/22/2015 0318   MCHC 30.9* 09/22/2015 0318   RDW 19.4* 09/22/2015 0318    CXR: edema pattern,  LLL Atx   IMPRESSION: 1) Acute on chronic hypercarbic respiratory failure - multifactorial 2) Obesity hypoventilation syndrome 3) Atelectasis 4) Pulm edema vs ALI/ARDS 5) Hypotension resolved 6) AKI, non-oliguric 7) Mild hyperglycemia without prior dx of DM 8) Elevated bilirubin - likely cholestasis. Rest of LFTs normal 9) Thrombocytopenia, unknown chronicity - stable to improved 10) ICU associated discomfort  PLAN/RECS:  Cont vent support - settings reviewed and/or adjusted Wean in PSV as tolerated Cont vent bundle Follow CXR intermittently Daily SBT if/when meets criteria MAP goal > 65 mmHg Monitor BMET intermittently Monitor I/Os Correct electrolytes as indicated Cont TFs Monitor glucose. Consider SSI for glu > 180 Hepatic function panel ordered for 01/05 Monitor temp, WBC count Micro and abx as above Monitor CBC intermittently RASS goal -1 Cont Fent/propofol with daily WUA  We are trying to reach family  CCM time: 35 mins The above time includes time spent reviewing care plan on multidisciplinary rounds  Billy Fischeravid Simonds, MD PCCM service Mobile [phone] Pager [phone]

## 2015-09-22 NOTE — Care Management (Signed)
Contacted [place] [employer] to assist in locating family members.  Informed that it appears Mitchell Morrow (who is listed as patient's mother) called ems to transport patient to hospital.  Agency will go to the residence at [address] [place] to make contact with mother.

## 2015-09-22 NOTE — Progress Notes (Signed)
Clinical Child psychotherapistocial Worker (CSW) discussed case with RN Sports coachCase Manager. Physician has not been able to reach any family members for patient. Patient has had no visitors and is currently on the vent. CSW attempted to call both phone numbers on the face sheet and they went straight to voicemail and messages were left. RN Case Manager will call [place] police to request a wellness check on the patient's listed address. CSW will continue to follow and assist as needed.   Jetta LoutBailey Morgan, LCSWA [phone]

## 2015-09-22 NOTE — Care Management (Signed)
Met with patient and his sister [name].  Patient is awake and it is obvious that he is able to follow conversation and does try to joke.  Discussed plans for progression of care.  Long range discharge plans can included- skilled nursing placement.  Discussed long term acute care hospital if it is found that level of care would be needed.  Discussed home health with nursing, physical therapy, occupational therapy, aide, and Education officer, museum.  When patient is able to go home- he will discharge to the home of his sister.  She lives in the same apartment complex but is on the first floor [place]  Phone  [phone].  Currently patient is living in a second story apartment.  He has only walked up the stairs twice- once to get into the apartment and the other to leave it to come to the hospital for this [place].  He has live in that apartment for "a few months."  Discussed the need for medical equipment when able to discharge home- bariatric hospital bed, bariatric walker and bedside commode.  Discussed that would not want to become reliable on using only a wheelchair for getting around

## 2015-09-22 NOTE — Progress Notes (Signed)
eLink Physician-Brief Progress Note Patient Name: Mitchell Morrow DOB: 08/23/1976 MRN: [id number]   Date of Service  09/22/2015  HPI/Events of Note  Persistent hypotension despite IVF bolus.   eICU Interventions  1. Neo gtt to maintain MAP & SBP 2. Checking Troponin I 3. Checking EKG     Intervention Category Major Interventions: Hypotension - evaluation and management  Lawanda CousinsJennings Shykeria Sakamoto 09/22/2015, 9:53 PM

## 2015-09-22 NOTE — Progress Notes (Signed)
[facility] [place] at [facility]   PATIENT NAME: Mitchell Morrow    MR#:  [id number]  DATE OF BIRTH:  1976/05/24  SUBJECTIVE:  CHIEF COMPLAINT:   Chief Complaint  Patient presents with  . Leg Swelling    Came with SOB, found having Pneumonia, Was completely alert and oriented 09/19/15 , but overnight he had drowsiness and was unarousable. Found to have CO2 retention, so intubated 09/20/15 and transferred to ICU for further care.  remains on vent support, required levophed- but now  Off, BP stable. Awake .  REVIEW OF SYSTEMS:   Review of Systems  Unable to perform ROS: intubated    DRUG ALLERGIES:  No Known Allergies  VITALS:  Blood pressure 107/69, pulse 120, temperature 100.3 F (37.9 C), temperature source Oral, resp. rate 17, height 5\' 9"  (1.753 m), weight 241.087 kg (531 lb 8 oz), SpO2 98 %.  PHYSICAL EXAMINATION:  GENERAL:  40 y.o.-year-old morbidly obase patient lying in the bed on vent support. EYES: Pupils equal, round, reactive to light . No scleral icterus. Extraocular muscles intact.  HEENT: Head atraumatic, normocephalic. Oropharynx and nasopharynx clear.  NECK:  Supple, no jugular venous distention. No thyroid enlargement, no tenderness.  LUNGS: Normal breath sounds bilaterally,some wheezing,some crepitation. No use of accessory muscles of respiration. ET tube in place. CARDIOVASCULAR: S1, S2 normal. No murmurs, rubs, or gallops.  ABDOMEN: Soft, nontender, nondistended. Bowel sounds present. No organomegaly or mass.  EXTREMITIES: severe chronic pedal edema,no cyanosis, or clubbing.  NEUROLOGIC: sedated on vent PSYCHIATRIC: The patient is sedated.  SKIN: chronic skin fold fungal infections.  Physical Exam LABORATORY PANEL:   CBC  Recent Labs Lab 09/22/15 0318  WBC 8.8  HGB 13.8  HCT 44.6  PLT 77*   ------------------------------------------------------------------------------------------------------------------  Chemistries    Recent Labs Lab 09/18/15 0448  09/22/15 0318  NA  --   < > 138  K  --   < > 4.4  CL  --   < > 94*  CO2  --   < > 37*  GLUCOSE  --   < > 135*  BUN  --   < > 31*  CREATININE  --   < > 1.93*  CALCIUM  --   < > 8.7*  MG 1.9  --   --   AST  --   < > 19  ALT  --   < > 10*  ALKPHOS  --   < > 71  BILITOT  --   < > 3.5*  < > = values in this interval not displayed. ------------------------------------------------------------------------------------------------------------------  Cardiac Enzymes  Recent Labs Lab 09/16/2015 2156  TROPONINI <0.03   ------------------------------------------------------------------------------------------------------------------  RADIOLOGY:  Dg Chest Port 1 View  09/22/2015  CLINICAL DATA:  Respiratory failure, history of CHF, acute encephalopathy, current smoker. EXAM: PORTABLE CHEST 1 VIEW COMPARISON:  Portable chest x-ray of September 20, 2015 FINDINGS: The lungs are slightly better inflated today. This may be at least in part due to differences in patient positioning. The pulmonary interstitial markings remain increased especially on the right. The retrocardiac region on the left remains dense. The cardiac silhouette remains enlarged. The pulmonary vascularity remains engorged. The endotracheal tube tip lies 4.4 cm above the carina. The left internal jugular venous catheter tip projects over the proximal portion of the SVC. The esophagogastric tube tip projects below the inferior margin of the image. IMPRESSION: CHF with pulmonary interstitial edema. Left lower lobe atelectasis or pneumonia. Overall there has not  been significant interval change since the previous study. The support tubes are in reasonable position. Electronically Signed   By: David  SwazilandJordan M.D.   On: 09/22/2015 07:54    ASSESSMENT AND PLAN:   Active Problems:   Pneumonia   Acute respiratory failure (HCC)   CHF (congestive heart failure) (HCC)   Acute encephalopathy   Cocaine abuse  *  Acute hypercapnic respiratory failure- due to sleep apnea. Continue full vent support  Pulmonary following  * Bilateral community-acquired pneumonia- Fever 100.3 Will start IV levaquin  Albuterol nebulizer when necessary.  likely has underlying COPD with his smoking history. He does have wheezing , on IV Solu-Medrol.  He also likely has underlying sleep apnea   * New onset congestive heart failure, systolic as per Echo- EF 45%  - has elevated BNP, edema and vascular congestion on chest x-ray  Reduce dose IV Lasix due to worsening renal function  Monitor input and output along with daily weight. Repeat potassium and replace as needed.  * Acute renal failure  Slight worsening in renal function, continue monitoring.  * Cutaneous candidiasis nystatin powder  * Morbid obesity will need bariatric referral as outpatient  * Thrombocytopenia - without acute bleeding  etiology unclear. No old labs available.  No aspirin, heparin or Lovenox.  negative hepatitis panel, negative HIV. Could also be due to acute infection.  No fever or mental status change or renal failure.  * Hyperbilirubinemia could be passive congestion from congestive heart failure. hepatitis panel and ultrasound of right upper quadrant suggestive of cirrhotic changes.  * Tobacco abuse counseled to quit smoking on admission  * DVT prophylaxis with SCDs.  All the records are reviewed and case discussed with Care Management/Social Workerr. Management plans discussed with the patient, family and they are in agreement.  CODE STATUS: full.  TOTAL TIME TAKING CARE OF THIS PATIENT: 35 critical care minutes.   Called Grand father at [phone] and he will request His mother to contact ICU.    Milagros LollSudini, Lamerle Jabs R M.D on 09/22/2015   Between 7am to 6pm - Pager - [phone]  After 6pm go to [email or web address] - password [email or web address] [facility]  [facility]  Office  [phone]  CC: Primary care physician; No  PCP Per Patient  Note: This dictation was prepared with Dragon dictation along with smaller phrase technology. Any transcriptional errors that result from this process are unintentional.

## 2015-09-22 NOTE — Care Management (Signed)
Found patients cell phone and battery dead.  Found a charger to charge it up.  Primary nurse provided much assistance as she answered incoming  calls.  One call that was "missed" was one with ID of "Grandfather"- [phone].  CM called this number and left a message.  Informed by primary nurse that someone claiming to be patient's sister has called inquiring when should the "pick up " patient.  This CM again called patient's mother.  She says she was aware that patient was in the hospital.  CM explained patient in icu and on ventilator.  Ms Mitchell Morrow says "someone will pick him up in a little bit."  Again explained that patient is critically ill with a breathing tube, unable to speak and on a machine that is breathing for him.  Ms Mitchell Morrow then exclaims "Oh My god!"  Explained that care team staff have left many unanswered messages on her phone.  She says that patient does not have a HCPOA.  CM very firm in statement- "Family members need to come to hospital at once."  She says patient's sister is on her way.  Updated attending

## 2015-09-22 NOTE — Progress Notes (Signed)
eLink Physician-Brief Progress Note Patient Name: Tonna Cornerherlo Vanaken DOB: 11/24/1975 MRN: [id number]   Date of Service  09/22/2015  HPI/Events of Note  Noticed hypotension on rounding. Camera check shows patient hypotensive. Receiving IVF bolus by hospitalist. UOP minimal after second dose of Lasix IV. Low grade temperature.   eICU Interventions  Complete fluid bolus. If temp >101F RN to notify eMD for pan-culture. Would favor vasopressors over further fluid to stabilize BP.      Intervention Category Major Interventions: Hypotension - evaluation and management  Lawanda CousinsJennings Nestor 09/22/2015, 8:51 PM

## 2015-09-22 NOTE — Progress Notes (Signed)
Nutrition Follow-up     INTERVENTION: EN: continue vital high protein at 41ml/hr and prostat times 6 to meet nutritional needs with current diprivan rate.    NUTRITION DIAGNOSIS:   Inadequate oral intake related to inability to eat as evidenced by NPO status.    GOAL:   Patient will meet greater than or equal to 90% of their needs    MONITOR:    (Energy Intake, Digestive System, Pulmonary Profile, Electrolyte and Renal Profile, Anthropometrics)  REASON FOR ASSESSMENT:   Ventilator, Consult Enteral/tube feeding initiation and management  ASSESSMENT:    Pt remains on vent.     Current Nutrition: Tolerating vital high protein at 37ml/hr with prostat times 6.    Diprivan providing 564 kcals with current rate at 21.72ml/hr over next 24 hr  Gastrointestinal Profile: Last BM: 12/29   Scheduled Medications:  . antiseptic oral rinse  7 mL Mouth Rinse 10 times per day  . budesonide  0.25 mg Nebulization 4 times per day  . chlorhexidine gluconate  15 mL Mouth Rinse BID  . docusate  100 mg Per Tube BID  . enoxaparin (LOVENOX) injection  40 mg Subcutaneous Q12H  . famotidine  20 mg Per Tube BID  . feeding supplement (PRO-STAT SUGAR FREE 64)  30 mL Oral 6 times per day  . free water  200 mL Per Tube 3 times per day  . furosemide  80 mg Intravenous Once  . ipratropium-albuterol  3 mL Nebulization Q6H  . levofloxacin (LEVAQUIN) IV  750 mg Intravenous Q24H  . nystatin   Topical BID  . sodium chloride  3 mL Intravenous Q12H  . sodium chloride  3 mL Intravenous Q12H    Continuous Medications:  . feeding supplement (VITAL HIGH PROTEIN) 1,000 mL (09/22/15 0600)  . fentaNYL infusion INTRAVENOUS 125 mcg/hr (09/22/15 0600)  . propofol (DIPRIVAN) infusion 15.005 mcg/kg/min (09/22/15 0600)     Electrolyte/Renal Profile and Glucose Profile:   Recent Labs Lab 09/18/15 0448  09/20/15 0855 09/21/15 0432 09/22/15 0318  NA  --   < > 136 138 138  K  --   < > 4.3 3.5 4.4  CL   --   < > 93* 94* 94*  CO2  --   < > 33* 35* 37*  BUN  --   < > 18 21* 31*  CREATININE  --   < > 1.69* 1.55* 1.93*  CALCIUM  --   < > 8.7* 8.4* 8.7*  MG 1.9  --   --   --   --   GLUCOSE  --   < > 168* 146* 135*  < > = values in this interval not displayed. Protein Profile:  Recent Labs Lab 08/20/2015 2156 09/19/15 0436 09/22/15 0318  ALBUMIN 3.7 3.6 3.2*       Weight Trend since Admission: Filed Weights   09/19/15 0526 09/20/15 0505 09/21/15 0400  Weight: 549 lb 1.6 oz (249.07 kg) 524 lb (237.685 kg) 531 lb 8 oz (241.087 kg)      Diet Order:   NPO  Skin:   reviewed  Height:   Ht Readings from Last 1 Encounters:  09/20/15 5\' 9"  (1.753 m)    Weight:   Wt Readings from Last 1 Encounters:  09/21/15 531 lb 8 oz (241.087 kg)    Ideal Body Weight:     BMI:  Body mass index is 78.45 kg/(m^2).  Estimated Nutritional Needs:   Kcal:  1600-1817kcals (22-25kcals/kg) using  IBW of 72.7kg  Protein:  145-181g protein (2.0-2.5g/kg) using IBW of 72.7kg  Fluid:  2181-255245mL of fluid (30-5935mL/kg)  EDUCATION NEEDS:   No education needs identified at this time  HIGH Care Level  Prachi Oftedahl B. Freida BusmanAllen, RD, LDN [phone] (pager) Weekend/On-Call pager [phone])

## 2015-09-22 NOTE — Progress Notes (Signed)
ANTIBIOTIC CONSULT NOTE - INITIAL  Pharmacy Consult for Levaquin Indication: rule out pneumonia  No Known Allergies  Patient Measurements: Height: 5\' 9"  (175.3 cm) Weight:  (UTA d/t scale function not working on bed at this time) IBW/kg (Calculated) : 70.7   Vital Signs: Temp: 100.3 F (37.9 C) (01/04 0726) Temp Source: Oral (01/04 0726) BP: 93/68 mmHg (01/04 0700) Pulse Rate: 108 (01/04 0726) Intake/Output from previous day: 01/03 0701 - 01/04 0700 In: 2354.3 [I.V.:852.9; NG/GT:1501.3] Out: 725 [Urine:725] Intake/Output from this shift: Total I/O In: 123 [I.V.:3; NG/GT:120] Out: -   Labs:  Recent Labs  09/20/15 0855 09/21/15 0432 09/22/15 0318  WBC  --  7.8 8.8  HGB  --  14.0 13.8  PLT  --  62* 77*  CREATININE 1.69* 1.55* 1.93*   Estimated Creatinine Clearance: 99 mL/min (by C-G formula based on Cr of 1.93). No results for input(s): VANCOTROUGH, VANCOPEAK, VANCORANDOM, GENTTROUGH, GENTPEAK, GENTRANDOM, TOBRATROUGH, TOBRAPEAK, TOBRARND, AMIKACINPEAK, AMIKACINTROU, AMIKACIN in the last 72 hours.   Microbiology: Recent Results (from the past 720 hour(s))  Blood culture (routine x 2)     Status: None (Preliminary result)   Collection Time: 08/24/2015 11:53 PM  Result Value Ref Range Status   Specimen Description BLOOD LEFT HAND  Final   Special Requests BOTTLES DRAWN AEROBIC AND ANAEROBIC 4CC  Final   Culture NO GROWTH 3 DAYS  Final   Report Status PENDING  Incomplete  Blood culture (routine x 2)     Status: None (Preliminary result)   Collection Time: 08/27/2015 11:53 PM  Result Value Ref Range Status   Specimen Description BLOOD LEFT ARM  Final   Special Requests BOTTLES DRAWN AEROBIC AND ANAEROBIC 5CC  Final   Culture NO GROWTH 3 DAYS  Final   Report Status PENDING  Incomplete  Urine culture     Status: None   Collection Time: 09/18/15 12:21 AM  Result Value Ref Range Status   Specimen Description URINE, RANDOM  Final   Special Requests NONE  Final   Culture MULTIPLE SPECIES PRESENT, SUGGEST RECOLLECTION  Final   Report Status 09/19/2015 FINAL  Final  Rapid Influenza A&B Antigens ([facility] only)     Status: None   Collection Time: 09/18/15  6:26 AM  Result Value Ref Range Status   Influenza A ([facility]) NOT DETECTED  Final   Influenza B ([facility]) NOT DETECTED  Final  MRSA PCR Screening     Status: None   Collection Time: 09/20/15  6:30 AM  Result Value Ref Range Status   MRSA by PCR NEGATIVE NEGATIVE Final    Comment:        The GeneXpert MRSA Assay (FDA approved for NASAL specimens only), is one component of a comprehensive MRSA colonization surveillance program. It is not intended to diagnose MRSA infection nor to guide or monitor treatment for MRSA infections.     Medical History: Past Medical History  Diagnosis Date  . Morbid obesity (HCC)   . Tobacco abuse     Medications:  Scheduled:  . antiseptic oral rinse  7 mL Mouth Rinse 10 times per day  . budesonide  0.25 mg Nebulization 4 times per day  . chlorhexidine gluconate  15 mL Mouth Rinse BID  . docusate  100 mg Per Tube BID  . enoxaparin (LOVENOX) injection  40 mg Subcutaneous Q12H  . famotidine  20 mg Per Tube BID  . feeding supplement (PRO-STAT SUGAR FREE 64)  30 mL Oral 6 times per day  .  free water  200 mL Per Tube 3 times per day  . furosemide  40 mg Intravenous Once  . ipratropium-albuterol  3 mL Nebulization Q6H  . levofloxacin (LEVAQUIN) IV  750 mg Intravenous Q24H  . nystatin   Topical BID  . sodium chloride  3 mL Intravenous Q12H  . sodium chloride  3 mL Intravenous Q12H   Infusions:  . feeding supplement (VITAL HIGH PROTEIN) 1,000 mL (09/22/15 0600)  . fentaNYL infusion INTRAVENOUS 125 mcg/hr (09/22/15 0600)  . propofol (DIPRIVAN) infusion 15 mcg/kg/min (09/22/15 1038)   Assessment: 40 y/o M with respiratory failure and new-onset CHF. Patient ordered empiric abx for possible PNA.   Plan:  Levaquin 750 mg iv q 24 hours. Will continue to follow renal  function and culture results.   Mitchell Morrow, [name] 09/22/2015,10:49 AM

## 2015-09-22 NOTE — Progress Notes (Signed)
Spoke to Dr. Sung AmabileSimonds in person. Informed him of pt's urine output. Pt has a total urine output since 7am today of 200 mL dark amber in color. Dr. Sung AmabileSimonds asked if he had much output after he received the scheduled lasix. Informed the output was not much after lasix. Dr. Sung AmabileSimonds stated he wanted the patient to have another 80 mg Lasix dose. Will place order.

## 2015-09-22 NOTE — Progress Notes (Signed)
Spoke with Dr. Sung AmabileSimonds in regards to new order entered of Lasix 40 mg IV push to be given once. Informed Dr. Sung AmabileSimonds nurse had already given 40 mg of lasix at approx. 0800. Dr. Sung AmabileSimonds stated he wanted pt to have a total of 80 mg of Lasix IV push to be given once now. Nurse will modify order to only give 40 mg of Lasix to give patient a total of 80 mg.

## 2015-09-22 NOTE — Progress Notes (Signed)
eLink Physician-Brief Progress Note Patient Name: Tonna Cornerherlo Annas DOB: 01/22/1976 MRN: [id number]   Date of Service  09/22/2015  HPI/Events of Note  RN notified of Temp 101.64F  eICU Interventions  Pan-Culture     Intervention Category Major Interventions: Sepsis - evaluation and management  Lawanda CousinsJennings Tyrice Hewitt 09/22/2015, 11:02 PM

## 2015-09-23 ENCOUNTER — Inpatient Hospital Stay: Payer: Medicaid Other

## 2015-09-23 DIAGNOSIS — R6520 Severe sepsis without septic shock: Secondary | ICD-10-CM

## 2015-09-23 DIAGNOSIS — J9602 Acute respiratory failure with hypercapnia: Secondary | ICD-10-CM

## 2015-09-23 DIAGNOSIS — A419 Sepsis, unspecified organism: Principal | ICD-10-CM

## 2015-09-23 DIAGNOSIS — J189 Pneumonia, unspecified organism: Secondary | ICD-10-CM

## 2015-09-23 DIAGNOSIS — J9601 Acute respiratory failure with hypoxia: Secondary | ICD-10-CM

## 2015-09-23 LAB — CULTURE, BLOOD (ROUTINE X 2)
Culture: NO GROWTH
Culture: NO GROWTH

## 2015-09-23 LAB — BASIC METABOLIC PANEL
ANION GAP: 8 (ref 5–15)
BUN: 52 mg/dL — ABNORMAL HIGH (ref 6–20)
CALCIUM: 8.5 mg/dL — AB (ref 8.9–10.3)
CO2: 35 mmol/L — AB (ref 22–32)
Chloride: 93 mmol/L — ABNORMAL LOW (ref 101–111)
Creatinine, Ser: 2.31 mg/dL — ABNORMAL HIGH (ref 0.61–1.24)
GFR calc Af Amer: 39 mL/min — ABNORMAL LOW (ref >60)
GFR calc non Af Amer: 33 mL/min — ABNORMAL LOW (ref >60)
GLUCOSE: 111 mg/dL — AB (ref 65–99)
Potassium: 4.1 mmol/L (ref 3.5–5.1)
Sodium: 136 mmol/L (ref 135–145)

## 2015-09-23 LAB — HEPATIC FUNCTION PANEL
ALK PHOS: 70 U/L (ref 38–126)
ALT: 10 U/L — AB (ref 17–63)
AST: 21 U/L (ref 15–41)
Albumin: 3.3 g/dL — ABNORMAL LOW (ref 3.5–5.0)
BILIRUBIN DIRECT: 2.2 mg/dL — AB (ref 0.1–0.5)
Indirect Bilirubin: 1.6 mg/dL — ABNORMAL HIGH (ref 0.3–0.9)
Total Bilirubin: 3.8 mg/dL — ABNORMAL HIGH (ref 0.3–1.2)
Total Protein: 7.3 g/dL (ref 6.5–8.1)

## 2015-09-23 LAB — GLUCOSE, CAPILLARY
Glucose-Capillary: 112 mg/dL — ABNORMAL HIGH (ref 65–99)
Glucose-Capillary: 112 mg/dL — ABNORMAL HIGH (ref 65–99)
Glucose-Capillary: 117 mg/dL — ABNORMAL HIGH (ref 65–99)
Glucose-Capillary: 123 mg/dL — ABNORMAL HIGH (ref 65–99)
Glucose-Capillary: 124 mg/dL — ABNORMAL HIGH (ref 65–99)

## 2015-09-23 LAB — CBC
HEMATOCRIT: 46.4 % (ref 40.0–52.0)
HEMOGLOBIN: 14.1 g/dL (ref 13.0–18.0)
MCH: 25.2 pg — ABNORMAL LOW (ref 26.0–34.0)
MCHC: 30.4 g/dL — ABNORMAL LOW (ref 32.0–36.0)
MCV: 83 fL (ref 80.0–100.0)
PLATELETS: 83 10*3/uL — AB (ref 150–440)
RBC: 5.59 MIL/uL (ref 4.40–5.90)
RDW: 19.7 % — ABNORMAL HIGH (ref 11.5–14.5)
WBC: 10.2 10*3/uL (ref 3.8–10.6)

## 2015-09-23 LAB — TROPONIN I: Troponin I: <0.03 ng/mL (ref <0.031)

## 2015-09-23 LAB — TRIGLYCERIDES: Triglycerides: 140 mg/dL (ref <150)

## 2015-09-23 LAB — PROCALCITONIN: Procalcitonin: 0.95 ng/mL

## 2015-09-23 MED ORDER — DEXMEDETOMIDINE HCL IN NACL 400 MCG/100ML IV SOLN
0.4000 ug/kg/h | INTRAVENOUS | Status: DC
  Administered 2015-09-23: 0.5 ug/kg/h via INTRAVENOUS
  Administered 2015-09-23 – 2015-09-24 (×17): 1.2 ug/kg/h via INTRAVENOUS
  Administered 2015-09-24: 1 ug/kg/h via INTRAVENOUS
  Administered 2015-09-24 (×3): 1.2 ug/kg/h via INTRAVENOUS
  Administered 2015-09-25: 1 ug/kg/h via INTRAVENOUS
  Administered 2015-09-25 (×8): 1.2 ug/kg/h via INTRAVENOUS
  Filled 2015-09-23 (×34): qty 100

## 2015-09-23 MED ORDER — VITAL HIGH PROTEIN PO LIQD
1000.0000 mL | ORAL | Status: DC
  Administered 2015-09-23 – 2015-09-26 (×3): 1000 mL

## 2015-09-23 MED ORDER — VANCOMYCIN HCL 10 G IV SOLR
1250.0000 mg | Freq: Once | INTRAVENOUS | Status: AC
  Administered 2015-09-23: 1250 mg via INTRAVENOUS
  Filled 2015-09-23: qty 1250

## 2015-09-23 MED ORDER — DEXTROSE 5 % IV SOLN
2.0000 g | Freq: Three times a day (TID) | INTRAVENOUS | Status: DC
  Administered 2015-09-23 – 2015-09-27 (×14): 2 g via INTRAVENOUS
  Filled 2015-09-23 (×15): qty 2

## 2015-09-23 MED ORDER — VANCOMYCIN HCL 10 G IV SOLR
1250.0000 mg | Freq: Three times a day (TID) | INTRAVENOUS | Status: DC
  Administered 2015-09-23 – 2015-09-24 (×3): 1250 mg via INTRAVENOUS
  Filled 2015-09-23 (×5): qty 1250

## 2015-09-23 NOTE — Consult Note (Signed)
[facility]  CONSULT NOTE    Date: 09/23/2015                  Patient Name:  Mitchell Morrow  MRN: [id number]  DOB: 12/21/1975  Age / Sex: 40 y.o., male         PCP: No PCP Per Patient                 Service Requesting Consult: Dr. Sung AmabileSimonds                 Reason for Consult: Acute Renal Failure            History of Present Illness: Mr. Mitchell Cornerherlo Munar is a 40 y.o. black male with morbid obesity, tobacco abuse, without prescribed home medications, who was admitted to [facility] on 09/01/2015 for Hyperbilirubinemia [E80.6] Bilateral pneumonia [J18.9]   Found to have systolic congestive heart failure with echo showing EF 45%.   Creatinine on admission of 1.12, risen to 2.31 and only had urine output of 775mL. Nephrology consulted. Recent episodes of hypotension. Now on norepinephrine. Briefly was also on neosynephrine. No IV contrast exposure.   Febrile with Tmax 101.7. Currently on vancomycin and ceftaz.   Platelets 83.   Medications: Outpatient medications: No prescriptions prior to admission    Current medications: Current Facility-Administered Medications  Medication Dose Route Frequency Provider Last Rate Last Dose  . 0.9 %  sodium chloride infusion  250 mL Intravenous PRN Milagros LollSrikar Sudini, MD   Stopped at 09/23/15 0800  . acetaminophen (TYLENOL) tablet 650 mg  650 mg Oral Q6H PRN Milagros LollSrikar Sudini, MD   650 mg at 09/23/15 [id number]   Or  . acetaminophen (TYLENOL) suppository 650 mg  650 mg Rectal Q6H PRN Srikar Sudini, MD      . albuterol (PROVENTIL) (2.5 MG/3ML) 0.083% nebulizer solution 2.5 mg  2.5 mg Nebulization Q3H PRN Merwyn Katosavid B Simonds, MD      . antiseptic oral rinse solution (CORINZ)  7 mL Mouth Rinse 10 times per day Erin FullingKurian Kasa, MD   7 mL at 09/23/15 1021  . budesonide (PULMICORT) nebulizer solution 0.25 mg  0.25 mg Nebulization 4 times per day Merwyn Katosavid B Simonds, MD   0.25 mg at 09/23/15 0800  . calcium carbonate (TUMS - dosed in mg elemental calcium) chewable  tablet 200 mg of elemental calcium  1 tablet Oral TID PRN Wyatt Hasteavid K Hower, MD   200 mg of elemental calcium at 09/20/15 0130  . cefTAZidime (FORTAZ) 2 g in dextrose 5 % 50 mL IVPB  2 g Intravenous 3 times per day Sherry Ruffingrystal G Scarpena, RPH      . chlorhexidine gluconate (PERIDEX) 0.12 % solution 15 mL  15 mL Mouth Rinse BID Erin FullingKurian Kasa, MD   15 mL at 09/23/15 0851  . dexmedetomidine (PRECEDEX) 400 MCG/100ML (4 mcg/mL) infusion  0.4-1.2 mcg/kg/hr Intravenous Titrated Merwyn Katosavid B Simonds, MD 31.4 mL/hr at 09/23/15 0910 0.5 mcg/kg/hr at 09/23/15 0910  . docusate (COLACE) 50 MG/5ML liquid 100 mg  100 mg Per Tube BID Merwyn Katosavid B Simonds, MD   100 mg at 09/23/15 0910  . enoxaparin (LOVENOX) injection 40 mg  40 mg Subcutaneous Q12H Erin FullingKurian Kasa, MD   40 mg at 09/23/15 0910  . famotidine (PEPCID) tablet 20 mg  20 mg Per Tube Daily Merwyn Katosavid B Simonds, MD   20 mg at 09/23/15 0910  . feeding supplement (PRO-STAT SUGAR FREE 64) liquid 30 mL  30 mL Oral 6 times per day  Merwyn Katos, MD   30 mL at 09/23/15 0815  . feeding supplement (VITAL HIGH PROTEIN) liquid 1,000 mL  1,000 mL Per Tube Continuous Merwyn Katos, MD 30 mL/hr at 09/23/15 1022 1,000 mL at 09/23/15 1022  . fentaNYL in NS (72mcg/ml) infusion-PREMIX  20-300 mcg/hr Intravenous Titrated Merwyn Katos, MD 7 mL/hr at 09/23/15 1023 70 mcg/hr at 09/23/15 1023  . free water 200 mL  200 mL Per Tube 3 times per day Merwyn Katos, MD   200 mL at 09/23/15 0600  . ipratropium-albuterol (DUONEB) 0.5-2.5 (3) MG/3ML nebulizer solution 3 mL  3 mL Nebulization Q6H Merwyn Katos, MD   3 mL at 09/23/15 0800  . LORazepam (ATIVAN) injection 1 mg  1 mg Intravenous Q2H PRN Merwyn Katos, MD      . norepinephrine (LEVOPHED) 16 mg in dextrose 5 % 250 mL (0.064 mg/mL) infusion  0-40 mcg/min Intravenous Titrated Georgianne Fick, MD 13.1 mL/hr at 09/23/15 0910 14 mcg/min at 09/23/15 0910  . nystatin (MYCOSTATIN/NYSTOP) topical powder   Topical BID Srikar Sudini, MD       . ondansetron [facility]) tablet 4 mg  4 mg Oral Q6H PRN Milagros Loll, MD       Or  . ondansetron (ZOFRAN) injection 4 mg  4 mg Intravenous Q6H PRN Milagros Loll, MD   4 mg at 09/20/15 0052  . polyethylene glycol (MIRALAX / GLYCOLAX) packet 17 g  17 g Per Tube Daily PRN Merwyn Katos, MD      . sodium chloride 0.9 % injection 10-40 mL  10-40 mL Intracatheter PRN Erin Fulling, MD      . sodium chloride 0.9 % injection 3 mL  3 mL Intravenous Q12H Milagros Loll, MD   3 mL at 09/23/15 0912  . sodium chloride 0.9 % injection 3 mL  3 mL Intravenous Q12H Milagros Loll, MD   3 mL at 09/23/15 0911  . sodium chloride 0.9 % injection 3 mL  3 mL Intravenous PRN Srikar Sudini, MD      . vancomycin (VANCOCIN) 1,250 mg in sodium chloride 0.9 % 250 mL IVPB  1,250 mg Intravenous Once Crystal G Scarpena, RPH          Allergies: No Known Allergies    Past Medical History: Past Medical History  Diagnosis Date  . Morbid obesity (HCC)   . Tobacco abuse      Past Surgical History: History reviewed. No pertinent past surgical history.   Family History: Family History  Problem Relation Age of Onset  . Diabetes Other      Social History: Social History   Social History  . Marital Status: Single    Spouse Name: N/A  . Number of Children: N/A  . Years of Education: N/A   Occupational History  . Not on file.   Social History Main Topics  . Smoking status: Current Every Day Smoker  . Smokeless tobacco: Not on file  . Alcohol Use: No  . Drug Use: Not on file  . Sexual Activity: Not on file   Other Topics Concern  . Not on file   Social History Narrative     Review of Systems: Review of Systems  Unable to perform ROS: intubated    Vital Signs: Blood pressure 111/71, pulse 100, temperature 100 F (37.8 C), temperature source Oral, resp. rate 16, height 5\' 9"  (1.753 m), weight 250.839 kg (553 lb), SpO2 97 %.  Weight trends: [employer]  09/20/15 0505 09/21/15 0400 09/23/15  0500  Weight: 237.685 kg (524 lb) 241.087 kg (531 lb 8 oz) 250.839 kg (553 lb)    Physical Exam: General: Critically ill  Head: +ETT +NGT  Eyes: Eyes closed, muddy sclera  Neck: Left IJ TLC catheter  Lungs:  Decreased breath sounds, intubated, ventilated PRVC 40% PEEP 8  Heart: Sinus tachycardia  Abdomen:  Soft, nontender, obese  Extremities: 2+ peripheral and dependent edema.  Neurologic: Sedated, intubated  Skin: No lesions  GU Foley with dark colored urine     Lab results: Basic Metabolic Panel:  Recent Labs Lab 09/18/15 0448  09/21/15 0432 09/22/15 0318 09/23/15 0447  NA  --   < > 138 138 136  K  --   < > 3.5 4.4 4.1  CL  --   < > 94* 94* 93*  CO2  --   < > 35* 37* 35*  GLUCOSE  --   < > 146* 135* 111*  BUN  --   < > 21* 31* 52*  CREATININE  --   < > 1.55* 1.93* 2.31*  CALCIUM  --   < > 8.4* 8.7* 8.5*  MG 1.9  --   --   --   --   < > = values in this interval not displayed.  Liver Function Tests:  Recent Labs Lab 09/19/15 0436 09/22/15 0318 09/23/15 0447  AST 25 19 21   ALT 12* 10* 10*  ALKPHOS 87 71 70  BILITOT 2.8* 3.5* 3.8*  PROT 7.5 7.3 7.3  ALBUMIN 3.6 3.2* 3.3*   No results for input(s): LIPASE, AMYLASE in the last 168 hours. No results for input(s): AMMONIA in the last 168 hours.  CBC:  Recent Labs Lab 09/18/15 0444 09/19/15 0436 09/21/15 0432 09/22/15 0318 09/23/15 0447  WBC 7.0 6.8 7.8 8.8 10.2  HGB 14.6 14.4 14.0 13.8 14.1  HCT 47.8 49.0 43.9 44.6 46.4  MCV 83.3 85.5 81.7 82.6 83.0  PLT 69* 69* 62* 77* 83*    Cardiac Enzymes:  Recent Labs Lab 09/02/2015 2156 09/22/15 2224 09/23/15 0447 09/23/15 0848  TROPONINI <0.03 <0.03 <0.03 <0.03    BNP: Invalid input(s): POCBNP  CBG:  Recent Labs Lab 09/22/15 1605 09/22/15 1946 09/23/15 0009 09/23/15 0420 09/23/15 0732  GLUCAP 111* 102* 124* 112* 117*    Microbiology: Results for orders placed or performed during the hospital encounter of 09/01/2015  Blood culture  (routine x 2)     Status: None (Preliminary result)   Collection Time: 09/08/2015 11:53 PM  Result Value Ref Range Status   Specimen Description BLOOD LEFT HAND  Final   Special Requests BOTTLES DRAWN AEROBIC AND ANAEROBIC 4CC  Final   Culture NO GROWTH 3 DAYS  Final   Report Status PENDING  Incomplete  Blood culture (routine x 2)     Status: None (Preliminary result)   Collection Time: 09/15/2015 11:53 PM  Result Value Ref Range Status   Specimen Description BLOOD LEFT ARM  Final   Special Requests BOTTLES DRAWN AEROBIC AND ANAEROBIC 5CC  Final   Culture NO GROWTH 3 DAYS  Final   Report Status PENDING  Incomplete  Urine culture     Status: None   Collection Time: 09/18/15 12:21 AM  Result Value Ref Range Status   Specimen Description URINE, RANDOM  Final   Special Requests NONE  Final   Culture MULTIPLE SPECIES PRESENT, SUGGEST RECOLLECTION  Final   Report Status 09/19/2015 FINAL  Final  Rapid Influenza  A&B Antigens ([facility] only)     Status: None   Collection Time: 09/18/15  6:26 AM  Result Value Ref Range Status   Influenza A [facility]) NOT DETECTED  Final   Influenza B ([facility]) NOT DETECTED  Final  MRSA PCR Screening     Status: None   Collection Time: 09/20/15  6:30 AM  Result Value Ref Range Status   MRSA by PCR NEGATIVE NEGATIVE Final    Comment:        The GeneXpert MRSA Assay (FDA approved for NASAL specimens only), is one component of a comprehensive MRSA colonization surveillance program. It is not intended to diagnose MRSA infection nor to guide or monitor treatment for MRSA infections.   Urine culture     Status: None (Preliminary result)   Collection Time: 09/22/15 11:16 PM  Result Value Ref Range Status   Specimen Description URINE, RANDOM  Final   Special Requests NONE  Final   Culture NO GROWTH < 12 HOURS  Final   Report Status PENDING  Incomplete    Coagulation Studies: No results for input(s): LABPROT, INR in the last 72 hours.  Urinalysis: No results for  input(s): COLORURINE, LABSPEC, PHURINE, GLUCOSEU, HGBUR, BILIRUBINUR, KETONESUR, PROTEINUR, UROBILINOGEN, NITRITE, LEUKOCYTESUR in the last 72 hours.  Invalid input(s): APPERANCEUR    Imaging: Dg Chest Port 1 View  09/23/2015  CLINICAL DATA:  Respiratory failure, pneumonia, CHF, acute encephalopathy, current smoker. EXAM: PORTABLE CHEST 1 VIEW COMPARISON:  Portable chest x-ray of September 21, 2014 FINDINGS: The lungs remain mildly hypoinflated. The cardiac silhouette remains enlarged. The pulmonary vascularity remains engorged. The retrocardiac region remains dense and the left hemidiaphragm is nearly totally obscured. Hazy alveolar opacity in the right lung persists. The endotracheal tube tip lies 6 cm above the carina. The esophagogastric tube tip projects below the inferior margin of the image. The left internal jugular venous catheter tip projects over the proximal SVC. IMPRESSION: CHF with pulmonary interstitial and mild alveolar edema. Persistent left lower lobe atelectasis or pneumonia. There has not been significant interval change since the previous study. The support tubes are in reasonable position. Electronically Signed   By: David  [place] M.D.   On: 09/23/2015 07:39   Dg Chest Port 1 View  09/22/2015  CLINICAL DATA:  Respiratory failure, history of CHF, acute encephalopathy, current smoker. EXAM: PORTABLE CHEST 1 VIEW COMPARISON:  Portable chest x-ray of September 20, 2015 FINDINGS: The lungs are slightly better inflated today. This may be at least in part due to differences in patient positioning. The pulmonary interstitial markings remain increased especially on the right. The retrocardiac region on the left remains dense. The cardiac silhouette remains enlarged. The pulmonary vascularity remains engorged. The endotracheal tube tip lies 4.4 cm above the carina. The left internal jugular venous catheter tip projects over the proximal portion of the SVC. The esophagogastric tube tip projects below  the inferior margin of the image. IMPRESSION: CHF with pulmonary interstitial edema. Left lower lobe atelectasis or pneumonia. Overall there has not been significant interval change since the previous study. The support tubes are in reasonable position. Electronically Signed   By: David  [place] M.D.   On: 09/22/2015 07:54      Assessment & Plan: Mr. Ricard Faulkner is a 40 y.o. black male with morbid obesity, tobacco abuse, without prescribed home medications, who was admitted to [facility] on 08/19/2015   1. Acute Renal Failure: nonoliguric urine output. Baseline creatinine of 1.12 on admission.  Several episodes of hypotension and  currently with sepsis. Most likely has ATN.  No acute indication for dialysis however will monitor urine output, renal function, electrolytes and volume status.  - Renally dose all medications No use in a renal ultrasound due to patient's body habitus and critical illness.   2. Sepsis: febrile with increasing wbc. Concerning for worsening pneumonia. Blood cultures have been negative.  - broad spectrum antibiotics: ceftaz and vanc - monitor vanc level.  -hemodynamically unstable requiring vasopressors.   3. Acute respiratory failure with bilateral pneumonia and acute exacerbation of systolic congestive heart failure: not currently on diuretics. CHF is a new diagnosis on this admission    LOS: 6 Dmitriy Gair 1/5/201710:26 AM

## 2015-09-23 NOTE — Progress Notes (Signed)
[facility] doctor, Dr. Jamison NeighborNestor was notified about patients BP still remaining low and neosynephrine gtt ordered by Dr. Jamison NeighborNestor.  Order placed and neo gtt started.

## 2015-09-23 NOTE — Progress Notes (Signed)
elink MD Dr. Jamison NeighborNestor notified of patients temp of 101.7 and he placed an order for blood, sputum and urine cultures to be done.

## 2015-09-23 NOTE — Progress Notes (Signed)
Nutrition Follow-up     INTERVENTION:   EN: pt being transitioned off diprivan today, recommend increasing TF to rate of 35 ml/hr with goal of 50 ml/hr, continue Prostat 6 packets daily; provides total of 164 g of protein, 1440 kcals. Additional kcals from diprivan. Continue to assess Nutrition related medication management: recommend further intervention regarding bowel regimen as pt without BM since 12/29  NUTRITION DIAGNOSIS:   Inadequate oral intake related to inability to eat as evidenced by NPO status. Being addressed via TF   GOAL:   Patient will meet greater than or equal to 90% of their needs   MONITOR:    (Energy Intake, Digestive System, Pulmonary Profile, Electrolyte and Renal Profile, Anthropometrics)  REASON FOR ASSESSMENT:   Ventilator, Consult Enteral/tube feeding initiation and management  ASSESSMENT:   Pt admitted with acute hypoxic and hypercapnic resp failure with acute metabolic encephalopathy from cocaine abuse leading to CHF and encephalopathy per MD note. Pt intubated this am.  Pt remains sedated on vent, febrile, started on levophed and precedex, transitioning off diprivan  EN: tolerating Vital High Protein at rate of 20 ml/hr  Digestive system: abdomen obese/soft, BS active, last BM 12/29, no signs of TF intolerance  Skin:  Reviewed, no issues  Last BM:  09/16/2015   Electrolyte and Renal Profile:  Recent Labs Lab 09/18/15 0448  09/21/15 0432 09/22/15 0318 09/23/15 0447  BUN  --   < > 21* 31* 52*  CREATININE  --   < > 1.55* 1.93* 2.31*  NA  --   < > 138 138 136  K  --   < > 3.5 4.4 4.1  MG 1.9  --   --   --   --   < > = values in this interval not displayed. Glucose Profile:  Recent Labs  09/23/15 0009 09/23/15 0420 09/23/15 0732  GLUCAP 124* 112* 117*   Lipid Profile:     Component Value Date/Time   TRIG 140 09/23/2015 0448    Nutritional Anemia Profile:  CBC Latest Ref Rng 09/23/2015 09/22/2015 09/21/2015  WBC 3.8 - 10.6  K/uL 10.2 8.8 7.8  Hemoglobin 13.0 - 18.0 g/dL 45.414.1 09.813.8 11.914.0  Hematocrit 40.0 - 52.0 % 46.4 44.6 43.9  Platelets 150 - 440 K/uL 83(L) 77(L) 62(L)    Meds: lasix, miralax prn, colace BID   Height:   Ht Readings from Last 1 Encounters:  09/20/15 5\' 9"  (1.753 m)    Weight:   Wt Readings from Last 1 Encounters:  09/23/15 553 lb (250.839 kg)    Filed Weights   09/20/15 0505 09/21/15 0400 09/23/15 0500  Weight: 524 lb (237.685 kg) 531 lb 8 oz (241.087 kg) 553 lb (250.839 kg)    BMI:  Body mass index is 81.63 kg/(m^2).  Estimated Nutritional Needs:   Kcal:  1600-1817kcals (22-25kcals/kg) using  IBW of 72.7kg  Protein:  145-181g protein (2.0-2.5g/kg) using IBW of 72.7kg  Fluid:  2181-255045mL of fluid (30-6435mL/kg)  EDUCATION NEEDS:   No education needs identified at this time  HIGH Care Level  Romelle Starcherate Arael Piccione MS, RD, LDN [phone] Pager  [phone] Weekend/On-Call Pager

## 2015-09-23 NOTE — Progress Notes (Signed)
PULMONARY/CCM PROGRESS NOTE  Active problems: Acute ventilator dependent hypercapneic respiratory failure Vasopressor dependent hypotension AKI - baseline Cr 1.06 Morbid obesity Smoker Cor pulmonale Hyperbilirubinemia Thrombocytopenia   Lines, Tubes, etc: ETT 01/02 >>  L IJ CVL 01/02 >>   Microbiology: Blood 12/30 >>  MRSA PCR 01/02 >> NEG Resp 01/04 >>  Urine 01/04 >>  Resp 01/05 >>  Blood 01/04 >>    PCT 01/04: 0.74  Antibiotics:  Levofloxacin 12/30 >> 01/01 Ceftriaxone 01/02 >> 01/02 Azithro 01/02 >> 01/02 Levofloxacin 01/04 >> 01/05 Vanc 01/05 >>  Ceftaz 01/05 >>    Studies/Events: 12/31 TTE:  poor quality study, LVEF 45% 12/31 RUQ: Nodular contour the liver raising the question of cirrhosis. Gallbladder wall thickening, nonspecific in appearance 01/04 night - fever, hypotension. Cultures obtained. Abx expanded  Consultants:  Renal 01/05: no acute indication for HD   Best Practice: DVT: enoxaparin SUP: enteral famotidine Nutrition: TFs Glycemic control: N/I Sedation/analgesia: fent/dexmedetomidine infusion. PRN lorazepam    Subj: RASS -2, + F/C on WUA  Obj: Filed Vitals:   09/23/15 1000 09/23/15 1100 09/23/15 1200 09/23/15 1300  BP: 111/71 100/73 133/103 125/98  Pulse: 100 102 90 86  Temp:   100.2 F (37.9 C)   TempSrc:   Oral   Resp: 16 22 16 17   Height:      Weight:      SpO2: 97% 96% 98% 98%    Gen: morbidly obese HEENT:  WNL Neck: CVL site clean, JVP cannot be assessed Chest: coarse BS without true wheezes, somewhat prolonged exp time Cardiac: distant HS, regular, no M noted Abd: obese, soft, + BS Ext: symmetric edema, chronic stasis changes  BMET    Component Value Date/Time   NA 136 09/23/2015 0447   K 4.1 09/23/2015 0447   CL 93* 09/23/2015 0447   CO2 35* 09/23/2015 0447   GLUCOSE 111* 09/23/2015 0447   BUN 52* 09/23/2015 0447   CREATININE 2.31* 09/23/2015 0447   CALCIUM 8.5* 09/23/2015 0447   GFRNONAA 33*  09/23/2015 0447   GFRAA 39* 09/23/2015 0447    CBC    Component Value Date/Time   WBC 10.2 09/23/2015 0447   RBC 5.59 09/23/2015 0447   HGB 14.1 09/23/2015 0447   HCT 46.4 09/23/2015 0447   PLT 83* 09/23/2015 0447   MCV 83.0 09/23/2015 0447   MCH 25.2* 09/23/2015 0447   MCHC 30.4* 09/23/2015 0447   RDW 19.7* 09/23/2015 0447    CXR:  Increased edema pattern, LLL atx   IMPRESSION: 1) Acute on chronic hypercarbic respiratory failure - multifactorial 2) Obesity hypoventilation syndrome 3) Atelectasis 4) Pulm edema vs ALI/ARDS 5) Fever, hypotension, suspect severe sepsis - likely due to PNA 6) Hypotension resolved 7) AKI, non-oliguric 8) Mild hyperglycemia without prior dx of DM 9) Elevated bilirubin - likely cholestasis. Rest of LFTs normal 10) Thrombocytopenia, unknown chronicity - stable to improved 11) ICU associated discomfort  PLAN/RECS:  Cont vent support - settings reviewed and/or adjusted Wean in PSV as tolerated Cont vent bundle Follow CXR intermittently Daily SBT if/when meets criteria MAP goal > 65 mmHg Monitor BMET intermittently Monitor I/Os Correct electrolytes as indicated Cont TFs Monitor glucose. Consider SSI for glu > 180 Monitor Hepatic function panel intermittently Monitor temp, WBC count Micro and abx as above Monitor CBC intermittently RASS goal -1 Cont Fent/propofol with daily WUA   CCM time: 40 mins The above time includes time spent reviewing care plan on multidisciplinary rounds  Billy Fischer, MD PCCM service  Mobile [phone] Pager [phone]

## 2015-09-23 NOTE — Progress Notes (Addendum)
[facility]ink MD, Dr. Nicholos Johnsamachandran was notified that patients BP still remains low and a levophed gtt order was placed and neo gtt was to be d/c'd.

## 2015-09-23 NOTE — Progress Notes (Signed)
Dr Hilton SinclairWeiting notified about patients low bp and a 500ml NS fluid bolus ordered.

## 2015-09-23 NOTE — Progress Notes (Signed)
[facility] - [place] at [facility]   PATIENT NAME: Mitchell Morrow    MR#:  [id number]  DATE OF BIRTH:  04/05/1976  SUBJECTIVE:  CHIEF COMPLAINT:   Chief Complaint  Patient presents with  . Leg Swelling    Came with SOB, found having Pneumonia, Was completely alert and oriented 09/19/15 , but overnight he had drowsiness and was unarousable. Found to have CO2 retention, so intubated 09/20/15 and transferred to ICU for further care.  remains on vent support, required levophed- but now  Off, BP stable. Sedated on 45% O2.  Had to be placed on Levophed. Tmax 101.7 overnight. Poor UOP.  REVIEW OF SYSTEMS:   Review of Systems  Unable to perform ROS: intubated    DRUG ALLERGIES:  No Known Allergies  VITALS:  Blood pressure 100/73, pulse 102, temperature 100 F (37.8 C), temperature source Oral, resp. rate 22, height 5\' 9"  (1.753 m), weight 250.839 kg (553 lb), SpO2 96 %.  PHYSICAL EXAMINATION:  GENERAL:  40 y.o.-year-old morbidly obase patient lying in the bed on vent support. EYES: Pupils equal, round, reactive to light . No scleral icterus. Extraocular muscles intact.  HEENT: Head atraumatic, normocephalic. Oropharynx and nasopharynx clear.  NECK:  Supple, no jugular venous distention. No thyroid enlargement, no tenderness.  LUNGS: Normal breath sounds bilaterally,some wheezing,some crepitation. No use of accessory muscles of respiration. ET tube in place. CARDIOVASCULAR: S1, S2 normal. No murmurs, rubs, or gallops.  ABDOMEN: Soft, nontender, nondistended. Bowel sounds present. No organomegaly or mass.  EXTREMITIES: severe chronic pedal edema,no cyanosis, or clubbing.  NEUROLOGIC: sedated on vent PSYCHIATRIC: The patient is sedated.  SKIN: chronic skin fold fungal infections.  Physical Exam LABORATORY PANEL:   CBC  Recent Labs Lab 09/23/15 0447  WBC 10.2  HGB 14.1  HCT 46.4  PLT 83*    ------------------------------------------------------------------------------------------------------------------  Chemistries   Recent Labs Lab 09/18/15 0448  09/23/15 0447  NA  --   < > 136  K  --   < > 4.1  CL  --   < > 93*  CO2  --   < > 35*  GLUCOSE  --   < > 111*  BUN  --   < > 52*  CREATININE  --   < > 2.31*  CALCIUM  --   < > 8.5*  MG 1.9  --   --   AST  --   < > 21  ALT  --   < > 10*  ALKPHOS  --   < > 70  BILITOT  --   < > 3.8*  < > = values in this interval not displayed. ------------------------------------------------------------------------------------------------------------------  Cardiac Enzymes  Recent Labs Lab 09/23/15 0447 09/23/15 0848  TROPONINI <0.03 <0.03   ------------------------------------------------------------------------------------------------------------------  RADIOLOGY:  Dg Chest Port 1 View  09/23/2015  CLINICAL DATA:  Respiratory failure, pneumonia, CHF, acute encephalopathy, current smoker. EXAM: PORTABLE CHEST 1 VIEW COMPARISON:  Portable chest x-ray of September 21, 2014 FINDINGS: The lungs remain mildly hypoinflated. The cardiac silhouette remains enlarged. The pulmonary vascularity remains engorged. The retrocardiac region remains dense and the left hemidiaphragm is nearly totally obscured. Hazy alveolar opacity in the right lung persists. The endotracheal tube tip lies 6 cm above the carina. The esophagogastric tube tip projects below the inferior margin of the image. The left internal jugular venous catheter tip projects over the proximal SVC. IMPRESSION: CHF with pulmonary interstitial and mild alveolar edema. Persistent left lower lobe atelectasis or pneumonia. There  has not been significant interval change since the previous study. The support tubes are in reasonable position. Electronically Signed   By: David  [place] M.D.   On: 09/23/2015 07:39   Dg Chest Port 1 View  09/22/2015  CLINICAL DATA:  Respiratory failure, history of  CHF, acute encephalopathy, current smoker. EXAM: PORTABLE CHEST 1 VIEW COMPARISON:  Portable chest x-ray of September 20, 2015 FINDINGS: The lungs are slightly better inflated today. This may be at least in part due to differences in patient positioning. The pulmonary interstitial markings remain increased especially on the right. The retrocardiac region on the left remains dense. The cardiac silhouette remains enlarged. The pulmonary vascularity remains engorged. The endotracheal tube tip lies 4.4 cm above the carina. The left internal jugular venous catheter tip projects over the proximal portion of the SVC. The esophagogastric tube tip projects below the inferior margin of the image. IMPRESSION: CHF with pulmonary interstitial edema. Left lower lobe atelectasis or pneumonia. Overall there has not been significant interval change since the previous study. The support tubes are in reasonable position. Electronically Signed   By: David  [place] M.D.   On: 09/22/2015 07:54    ASSESSMENT AND PLAN:   Active Problems:   Pneumonia   Acute respiratory failure (HCC)   CHF (congestive heart failure) (HCC)   Acute encephalopathy   Cocaine abuse  * Acute hypercapnic respiratory failure- due to sleep apnea. Continue full vent support  Pulmonary following  * Bilateral community-acquired pneumonia with septic shock- Fever 100.3 ON vancomycin and ceftazidime Pressors for MAP>65  Albuterol nebulizer when necessary.  likely has underlying COPD with his smoking history. He does have wheezing , on IV Solu-Medrol.  He also likely has underlying sleep apnea   * New onset congestive heart failure, systolic as per Echo- EF 45%  - has elevated BNP, edema and vascular congestion on chest x-ray  STOP IV Lasix due to worsening renal function  Monitor input and output along with daily weight. Repeat potassium and replace as needed.  * Acute renal failure - Due to ATN and diuresis Monitor. Appreciate nephrology  help  * Cutaneous candidiasis nystatin powder  * Morbid obesity will need bariatric referral as outpatient  * Thrombocytopenia - without acute bleeding  etiology unclear. No old labs available.  No aspirin, heparin or Lovenox.  negative hepatitis panel, negative HIV. Could also be due to acute infection.  No fever or mental status change or renal failure.  * Hyperbilirubinemia could be passive congestion from congestive heart failure. hepatitis panel and ultrasound of right upper quadrant suggestive of cirrhotic changes. US showed Cirrhosis  * Tobacco abuse counseled to quit smoking on admission  * DVT prophylaxis with SCDs.  All the records are reviewed and case discussed with Care Management/Social Workerr. Management plans discussed with the patient, family and they are in agreement.  CODE STATUS: full.  TOTAL TIME TAKING CARE OF THIS PATIENT: 35 critical care minutes.   Discussed with Dr. Bard Herbert.   Milagros Loll R M.D on 09/23/2015   Between 7am to 6pm - Pager - [phone]  After 6pm go to [email or web address] - password [email or web address] [facility]  [place] [facility]  Office  [phone]  CC: Primary care physician; No PCP Per Patient  Note: This dictation was prepared with Dragon dictation along with smaller phrase technology. Any transcriptional errors that result from this process are unintentional.

## 2015-09-23 NOTE — Care Management (Addendum)
Patient spike temp of 101.4. had systolic blood pressure of 74. Levophed started.  Remains on vent.

## 2015-09-23 NOTE — Progress Notes (Signed)
Pharmacy Antibiotic Follow-up Note  Mitchell Morrow is a 40 y.o. year-old male admitted on 08/19/2015.  The patient is currently on day 1 of ceftazidine and vancomycin for sepsis.  Assessment/Plan: Will initiate patient of ceftazidine 2g IV Q8hr and vancomycin 1250mg  IV Q8hr - for goal trough of 15-20.  Temp (24hrs), Avg:100.4 F (38 C), Min:99.3 F (37.4 C), Max:101.7 F (38.7 C)   Recent Labs Lab 09/18/15 0444 09/19/15 0436 09/21/15 0432 09/22/15 0318 09/23/15 0447  WBC 7.0 6.8 7.8 8.8 10.2    Recent Labs Lab 09/19/15 0436 09/20/15 0855 09/21/15 0432 09/22/15 0318 09/23/15 0447  CREATININE 1.12 1.69* 1.55* 1.93* 2.31*   Estimated Creatinine Clearance: 84.9 mL/min (by C-G formula based on Cr of 2.31).    No Known Allergies  Antimicrobials this admission: Levofloxacin 12/31>>1/1, 1/4 Ceftazidine 1/5 >> Vancomycin 1/5 >>  Levels/dose changes this admission: Vancomycin trough scheduled for 1/6 at 0900  Microbiology results: 1/4 BCx: No Growth < 24hours 1/4 UCx: No Growth < 24hours  1/4 Sputum: Yeast, Gram Negative Coccobacilli 1/5 Sputum: Yeast, Gram Positive Cocci  1/4 MRSA PCR: negative  In setting of obesity and renal failure, will obtain trough prior to am dose on 1/5 to help guide therapy.    Thank you for allowing pharmacy to be a part of this patient's care.  Tiaria Biby L PharmD 09/23/2015 3:59 PM

## 2015-09-24 ENCOUNTER — Inpatient Hospital Stay: Payer: Medicaid Other

## 2015-09-24 DIAGNOSIS — R17 Unspecified jaundice: Secondary | ICD-10-CM

## 2015-09-24 LAB — COMPREHENSIVE METABOLIC PANEL
ALT: 10 U/L — ABNORMAL LOW (ref 17–63)
ANION GAP: 7 (ref 5–15)
AST: 19 U/L (ref 15–41)
Albumin: 3 g/dL — ABNORMAL LOW (ref 3.5–5.0)
Alkaline Phosphatase: 55 U/L (ref 38–126)
BILIRUBIN TOTAL: 4.1 mg/dL — AB (ref 0.3–1.2)
BUN: 56 mg/dL — AB (ref 6–20)
CO2: 36 mmol/L — ABNORMAL HIGH (ref 22–32)
Calcium: 8.3 mg/dL — ABNORMAL LOW (ref 8.9–10.3)
Chloride: 96 mmol/L — ABNORMAL LOW (ref 101–111)
Creatinine, Ser: 1.61 mg/dL — ABNORMAL HIGH (ref 0.61–1.24)
GFR, EST AFRICAN AMERICAN: 60 mL/min — AB (ref >60)
GFR, EST NON AFRICAN AMERICAN: 52 mL/min — AB (ref >60)
Glucose, Bld: 127 mg/dL — ABNORMAL HIGH (ref 65–99)
POTASSIUM: 4.1 mmol/L (ref 3.5–5.1)
Sodium: 139 mmol/L (ref 135–145)
TOTAL PROTEIN: 6.6 g/dL (ref 6.5–8.1)

## 2015-09-24 LAB — CBC WITH DIFFERENTIAL/PLATELET
Basophils Absolute: 0 10*3/uL (ref 0–0.1)
Basophils Relative: 0 %
EOS ABS: 0.3 10*3/uL (ref 0–0.7)
Eosinophils Relative: 3 %
HEMATOCRIT: 45.9 % (ref 40.0–52.0)
Hemoglobin: 14 g/dL (ref 13.0–18.0)
LYMPHS ABS: 2.1 10*3/uL (ref 1.0–3.6)
Lymphocytes Relative: 19 %
MCH: 25.3 pg — AB (ref 26.0–34.0)
MCHC: 30.6 g/dL — AB (ref 32.0–36.0)
MCV: 82.7 fL (ref 80.0–100.0)
MONO ABS: 1.5 10*3/uL — AB (ref 0.2–1.0)
Neutro Abs: 6.7 10*3/uL — ABNORMAL HIGH (ref 1.4–6.5)
Platelets: 71 10*3/uL — ABNORMAL LOW (ref 150–440)
RBC: 5.55 MIL/uL (ref 4.40–5.90)
RDW: 19.6 % — AB (ref 11.5–14.5)
WBC: 10.6 10*3/uL (ref 3.8–10.6)

## 2015-09-24 LAB — GLUCOSE, CAPILLARY
GLUCOSE-CAPILLARY: 148 mg/dL — AB (ref 65–99)
GLUCOSE-CAPILLARY: 117 mg/dL — AB (ref 65–99)
GLUCOSE-CAPILLARY: 133 mg/dL — AB (ref 65–99)
Glucose-Capillary: 161 mg/dL — ABNORMAL HIGH (ref 65–99)

## 2015-09-24 LAB — URINE CULTURE: CULTURE: NO GROWTH

## 2015-09-24 LAB — PROCALCITONIN: Procalcitonin: 0.77 ng/mL

## 2015-09-24 MED ORDER — SENNA 8.6 MG PO TABS
2.0000 | ORAL_TABLET | Freq: Every day | ORAL | Status: DC
  Administered 2015-09-24 – 2015-09-28 (×5): 17.2 mg
  Filled 2015-09-24 (×5): qty 2

## 2015-09-24 MED ORDER — POLYETHYLENE GLYCOL 3350 17 G PO PACK
17.0000 g | PACK | Freq: Every day | ORAL | Status: DC
  Administered 2015-09-24 – 2015-09-29 (×6): 17 g
  Filled 2015-09-24 (×7): qty 1

## 2015-09-24 MED ORDER — FENTANYL CITRATE (PF) 100 MCG/2ML IJ SOLN
25.0000 ug | INTRAMUSCULAR | Status: DC | PRN
  Administered 2015-09-24 – 2015-09-28 (×3): 100 ug via INTRAVENOUS
  Filled 2015-09-24 (×3): qty 2

## 2015-09-24 NOTE — Progress Notes (Signed)
[facility] - [place] at [facility]   PATIENT NAME: Mitchell Morrow    MR#:  [id number]  DATE OF BIRTH:  May 29, 1976  SUBJECTIVE:  CHIEF COMPLAINT:   Chief Complaint  Patient presents with  . Leg Swelling    Came with SOB, found having Pneumonia, Was completely alert and oriented 09/19/15 , but overnight he had drowsiness and was unarousable. Found to have CO2 retention, so intubated 09/20/15 and transferred to ICU for further care.  remains on vent support, required levophed- but now  Off, BP stable. Sedated on 45% O2. Poor UOP.  REVIEW OF SYSTEMS:   Review of Systems  Unable to perform ROS: intubated    DRUG ALLERGIES:  No Known Allergies  VITALS:  Blood pressure 96/77, pulse 78, temperature 99.4 F (37.4 C), temperature source Oral, resp. rate 18, height 5\' 9"  (1.753 m), weight 250.7 kg (552 lb 11.1 oz), SpO2 96 %.  PHYSICAL EXAMINATION:  GENERAL:  40 y.o.-year-old morbidly obase patient lying in the bed on vent support. EYES: Pupils equal, round, reactive to light . No scleral icterus. Extraocular muscles intact.  HEENT: Head atraumatic, normocephalic. Oropharynx and nasopharynx clear.  NECK:  Supple, no jugular venous distention. No thyroid enlargement, no tenderness.  LUNGS: Normal breath sounds bilaterally,some wheezing,some crepitation. No use of accessory muscles of respiration. ET tube in place. CARDIOVASCULAR: S1, S2 normal. No murmurs, rubs, or gallops.  ABDOMEN: Soft, nontender, nondistended. Bowel sounds present. No organomegaly or mass.  EXTREMITIES: severe chronic pedal edema,no cyanosis, or clubbing.  NEUROLOGIC: sedated on vent PSYCHIATRIC: The patient is sedated.  SKIN: chronic skin fold fungal infections.  Physical Exam LABORATORY PANEL:   CBC  Recent Labs Lab 09/24/15 0512  WBC 10.6  HGB 14.0  HCT 45.9  PLT 71*    ------------------------------------------------------------------------------------------------------------------  Chemistries   Recent Labs Lab 09/18/15 0448  09/24/15 0512  NA  --   < > 139  K  --   < > 4.1  CL  --   < > 96*  CO2  --   < > 36*  GLUCOSE  --   < > 127*  BUN  --   < > 56*  CREATININE  --   < > 1.61*  CALCIUM  --   < > 8.3*  MG 1.9  --   --   AST  --   < > 19  ALT  --   < > 10*  ALKPHOS  --   < > 55  BILITOT  --   < > 4.1*  < > = values in this interval not displayed. ------------------------------------------------------------------------------------------------------------------  Cardiac Enzymes  Recent Labs Lab 09/23/15 0447 09/23/15 0848  TROPONINI <0.03 <0.03   ------------------------------------------------------------------------------------------------------------------  RADIOLOGY:  Dg Chest Port 1 View  09/24/2015  CLINICAL DATA:  Respiratory failure. EXAM: PORTABLE CHEST 1 VIEW COMPARISON:  Chest x-rays from September 28, 2015 to 09/23/2015 FINDINGS: Endotracheal tube, central line, and OG tube appear unchanged. Persistent increased density at both lung bases, most likely pleural effusions and atelectasis. Fluid along the fissures in the right hemi thorax. New linear area of atelectasis in the right lung apex. Cardiomegaly with enlargement of the main pulmonary artery. IMPRESSION: New linear atelectasis in the right lung apex. Persistent bibasilar atelectasis and effusions. Electronically Signed   By: Francene Boyers M.D.   On: 09/24/2015 07:53   Dg Chest Port 1 View  09/23/2015  CLINICAL DATA:  Respiratory failure, pneumonia, CHF, acute encephalopathy, current smoker. EXAM: PORTABLE CHEST 1  VIEW COMPARISON:  Portable chest x-ray of September 21, 2014 FINDINGS: The lungs remain mildly hypoinflated. The cardiac silhouette remains enlarged. The pulmonary vascularity remains engorged. The retrocardiac region remains dense and the left hemidiaphragm is nearly  totally obscured. Hazy alveolar opacity in the right lung persists. The endotracheal tube tip lies 6 cm above the carina. The esophagogastric tube tip projects below the inferior margin of the image. The left internal jugular venous catheter tip projects over the proximal SVC. IMPRESSION: CHF with pulmonary interstitial and mild alveolar edema. Persistent left lower lobe atelectasis or pneumonia. There has not been significant interval change since the previous study. The support tubes are in reasonable position. Electronically Signed   By: David  [place] M.D.   On: 09/23/2015 07:39    ASSESSMENT AND PLAN:   Active Problems:   Pneumonia   Acute respiratory failure (HCC)   CHF (congestive heart failure) (HCC)   Acute encephalopathy   Cocaine abuse  * Acute hypercapnic respiratory failure- due to sleep apnea. Continue full vent support  Pulmonary following  * Bilateral community-acquired pneumonia with septic shock On vancomycin and ceftazidime Pressors for MAP>65  Albuterol nebulizer when necessary.  Likely has underlying COPD with his smoking history. He does have wheezing , on IV Solu-Medrol.  He also likely has underlying sleep apnea.  * New onset congestive heart failure, systolic as per Echo- EF 45%  - has elevated BNP, edema and vascular congestion on chest x-ray  Stopped IV Lasix due to worsening renal function  Monitor input and output along with daily weight.  * Acute renal failure - Due to ATN and diuresis Monitor. Appreciate nephrology help  * Cutaneous candidiasis nystatin powder  * Morbid obesity will need bariatric referral as outpatient  * Thrombocytopenia - without acute bleeding  Etiology unclear. No old labs available.  No aspirin, heparin or Lovenox.  negative hepatitis panel, negative HIV. Could also be due to acute infection.  No fever or mental status change or renal failure.  * Hyperbilirubinemia could be passive congestion from congestive heart  failure. hepatitis panel and ultrasound of right upper quadrant suggestive of cirrhotic changes. US showed Cirrhosis  * Tobacco abuse counseled to quit smoking on admission  * DVT prophylaxis with SCDs.  All the records are reviewed and case discussed with Care Management/Social Workerr. Management plans discussed with the patient, family and they are in agreement.  CODE STATUS: full.  TOTAL TIME TAKING CARE OF THIS PATIENT: 35 critical care minutes.   Discussed with Dr. Bard HerbertSimmonds.   Milagros LollSudini, Victoria Henshaw R M.D on 09/24/2015   Between 7am to 6pm - Pager - [phone]  After 6pm go to [email or web address] - password [email or web address] [facility]  [place] [place] [facility]  Office  [phone]  CC: Primary care physician; No PCP Per Patient  Note: This dictation was prepared with Dragon dictation along with smaller phrase technology. Any transcriptional errors that result from this process are unintentional.

## 2015-09-24 NOTE — Progress Notes (Signed)
Pharmacy Antibiotic Follow-up Note  Mitchell Morrow is a 40 y.o. year-old male admitted on 09/02/2015.  The patient is currently on day 2 of ceftazidine and vancomycin for sepsis.  Assessment/Plan: Will continue ceftazidime 2 g iv q 8 hours. Nephrology is concerned about vancomycin dosing of q 8 hours in setting of acute renal failure. Random vancomycin level was ordered, but am dose was already hung. Will d/c vancomycin order for now and check a trough with the next dose due at 1800. If trough is acceptable (< or = 20 mcg/ml), may consider reordering vancomycin with q 12 hour dosing.    Temp (24hrs), Avg:99.7 F (37.6 C), Min:99.4 F (37.4 C), Max:100.2 F (37.9 C)   Recent Labs Lab 09/19/15 0436 09/21/15 0432 09/22/15 0318 09/23/15 0447 09/24/15 0512  WBC 6.8 7.8 8.8 10.2 10.6     Recent Labs Lab 09/20/15 0855 09/21/15 0432 09/22/15 0318 09/23/15 0447 09/24/15 0512  CREATININE 1.69* 1.55* 1.93* 2.31* 1.61*   Estimated Creatinine Clearance: 121.9 mL/min (by C-G formula based on Cr of 1.61).    No Known Allergies  Antimicrobials this admission: Levofloxacin 12/31>>1/1, 1/4 Ceftazidine 1/5 >> Vancomycin 1/5 >>  Levels/dose changes this admission: Vancomycin trough scheduled for 1/6 at 0900  Microbiology results: 1/4 BCx: NGTD x 2 1/4 UCx: negative   1/4 Sputum: Yeast, Gram Negative Coccobacilli 1/5 Sputum: Yeast, Gram Positive Cocci  1/4 MRSA PCR: negative  Thank you for allowing pharmacy to be a part of this patient's care.  Mitchell Morrow, Mitchell Morrow D PharmD 09/24/2015 10:23 AM

## 2015-09-24 NOTE — Progress Notes (Signed)
Nutrition Follow-up     INTERVENTION:  Coordination of care: Recommend increasing bowel regimen to promote BM (last documented on 12/29), check abdominal xray.  Possible restart of tube feeding at lower rate.  Discussed with RN, Charli and RN to follow up with Dr. Sung Amabile   NUTRITION DIAGNOSIS:   Inadequate oral intake related to inability to eat as evidenced by NPO status.    GOAL:   Patient will meet greater than or equal to 90% of their needs    MONITOR:    (Energy Intake, Digestive System, Pulmonary Profile, Electrolyte and Renal Profile, Anthropometrics)  REASON FOR ASSESSMENT:   Ventilator, Consult Enteral/tube feeding initiation and management  ASSESSMENT:     Pt remains on vent, sedation off this am for possible extubation   Current Nutrition: TF of vital high protein currently off for possible wean    Gastrointestinal Profile: no vomiting per RN, Charli, noted residual check of last shift,  abdomen difficult to assess Last BM: 12/29   Scheduled Medications:  . antiseptic oral rinse  7 mL Mouth Rinse 10 times per day  . budesonide  0.25 mg Nebulization 4 times per day  . cefTAZidime (FORTAZ)  IV  2 g Intravenous 3 times per day  . chlorhexidine gluconate  15 mL Mouth Rinse BID  . docusate  100 mg Per Tube BID  . enoxaparin (LOVENOX) injection  40 mg Subcutaneous Q12H  . famotidine  20 mg Per Tube Daily  . feeding supplement (PRO-STAT SUGAR FREE 64)  30 mL Oral 6 times per day  . free water  200 mL Per Tube 3 times per day  . ipratropium-albuterol  3 mL Nebulization Q6H  . nystatin   Topical BID  . sodium chloride  3 mL Intravenous Q12H  . sodium chloride  3 mL Intravenous Q12H  . vancomycin  1,250 mg Intravenous Q8H    Continuous Medications:  . dexmedetomidine 1.2 mcg/kg/hr (09/24/15 0910)  . feeding supplement (VITAL HIGH PROTEIN) 1,000 mL (09/24/15 0700)  . fentaNYL infusion INTRAVENOUS 20 mcg/hr (09/24/15 0700)  . norepinephrine  (LEVOPHED) Adult infusion Stopped (09/23/15 1800)     Electrolyte/Renal Profile and Glucose Profile:   Recent Labs Lab 09/18/15 0448  09/22/15 0318 09/23/15 0447 09/24/15 0512  NA  --   < > 138 136 139  K  --   < > 4.4 4.1 4.1  CL  --   < > 94* 93* 96*  CO2  --   < > 37* 35* 36*  BUN  --   < > 31* 52* 56*  CREATININE  --   < > 1.93* 2.31* 1.61*  CALCIUM  --   < > 8.7* 8.5* 8.3*  MG 1.9  --   --   --   --   GLUCOSE  --   < > 135* 111* 127*  < > = values in this interval not displayed. Protein Profile:  Recent Labs Lab 09/22/15 0318 09/23/15 0447 09/24/15 0512  ALBUMIN 3.2* 3.3* 3.0*      Weight Trend since Admission: Filed Weights   09/21/15 0400 09/23/15 0500 09/24/15 0500  Weight: 531 lb 8 oz (241.087 kg) 553 lb (250.839 kg) 552 lb 11.1 oz (250.7 kg)    Diet Order:   NPO  Skin:  Reviewed, no issues   Height:   Ht Readings from Last 1 Encounters:  09/20/15 5\' 9"  (1.753 m)    Weight:   Wt Readings from Last 1 Encounters:  09/24/15 552  lb 11.1 oz (250.7 kg)    Ideal Body Weight:     BMI:  Body mass index is 81.58 kg/(m^2).  Estimated Nutritional Needs:   Kcal:  1600-1817kcals (22-25kcals/kg) using  IBW of 72.7kg  Protein:  145-181g protein (2.0-2.5g/kg) using IBW of 72.7kg  Fluid:  2181-255145mL of fluid (30-3835mL/kg)  EDUCATION NEEDS:   No education needs identified at this time  HIGH Care Level  Yaiden Yang B. Freida BusmanAllen, RD, LDN [phone] (pager) Weekend/On-Call pager [phone])

## 2015-09-24 NOTE — Progress Notes (Signed)
[facility]  ROUNDING NOTE   Subjective:   UOP 2395 (775) Creatinine 1.61 (2.31)  Tmax 100.2  Objective:  Vital signs in last 24 hours:  Temp:  [99.4 F (37.4 C)-100.2 F (37.9 C)] 99.4 F (37.4 C) (01/06 0813) Pulse Rate:  [75-102] 78 (01/06 0800) Resp:  [16-22] 18 (01/06 0800) BP: (89-133)/(60-103) 96/77 mmHg (01/06 0800) SpO2:  [95 %-98 %] 96 % (01/06 0800) FiO2 (%):  [40 %] 40 % (01/06 0725) Weight:  [250.7 kg (552 lb 11.1 oz)] 250.7 kg (552 lb 11.1 oz) (01/06 0500)  Weight change: -0.139 kg (-4.9 oz) Filed Weights   09/21/15 0400 09/23/15 0500 09/24/15 0500  Weight: 241.087 kg (531 lb 8 oz) 250.839 kg (553 lb) 250.7 kg (552 lb 11.1 oz)    Intake/Output: I/O last 3 completed shifts: In: 6256.5 [I.V.:2377.8; NG/GT:2478.7; IV Piggyback:1400] Out: 2970 [Urine:2970]   Intake/Output this shift:  Total I/O In: 100.2 [I.V.:50.2; IV Piggyback:50] Out: -   Physical Exam: General: Critically ill  Head: +ETT, +NGT  Eyes: Anicteric, PERRL  Neck: Left IJ TLC catheter  Lungs:  Decreased breath sounds, intubated, ventilated PRVC 40% PEEP 8  Heart: regular  Abdomen:  Soft, nontender, obese  Extremities: 2+ peripheral and dependent edema.  Neurologic: Sedated, intubated  Skin: No lesions  GU: foley    Basic Metabolic Panel:  Recent Labs Lab 09/18/15 0448  09/20/15 0855 09/21/15 0432 09/22/15 0318 09/23/15 0447 09/24/15 0512  NA  --   < > 136 138 138 136 139  K  --   < > 4.3 3.5 4.4 4.1 4.1  CL  --   < > 93* 94* 94* 93* 96*  CO2  --   < > 33* 35* 37* 35* 36*  GLUCOSE  --   < > 168* 146* 135* 111* 127*  BUN  --   < > 18 21* 31* 52* 56*  CREATININE  --   < > 1.69* 1.55* 1.93* 2.31* 1.61*  CALCIUM  --   < > 8.7* 8.4* 8.7* 8.5* 8.3*  MG 1.9  --   --   --   --   --   --   < > = values in this interval not displayed.  Liver Function Tests:  Recent Labs Lab 2015/10/15 2156 09/19/15 0436 09/22/15 0318 09/23/15 0447 09/24/15 0512  AST 27 25 19  21 19   ALT 12* 12* 10* 10* 10*  ALKPHOS 93 87 71 70 55  BILITOT 2.8* 2.8* 3.5* 3.8* 4.1*  PROT 7.9 7.5 7.3 7.3 6.6  ALBUMIN 3.7 3.6 3.2* 3.3* 3.0*   No results for input(s): LIPASE, AMYLASE in the last 168 hours. No results for input(s): AMMONIA in the last 168 hours.  CBC:  Recent Labs Lab 09/19/15 0436 09/21/15 0432 09/22/15 0318 09/23/15 0447 09/24/15 0512  WBC 6.8 7.8 8.8 10.2 10.6  NEUTROABS  --   --   --   --  6.7*  HGB 14.4 14.0 13.8 14.1 14.0  HCT 49.0 43.9 44.6 46.4 45.9  MCV 85.5 81.7 82.6 83.0 82.7  PLT 69* 62* 77* 83* 71*    Cardiac Enzymes:  Recent Labs Lab 15-Oct-2015 2156 09/22/15 2224 09/23/15 0447 09/23/15 0848  TROPONINI <0.03 <0.03 <0.03 <0.03    BNP: Invalid input(s): POCBNP  CBG:  Recent Labs Lab 09/23/15 0420 09/23/15 0732 09/23/15 1148 09/23/15 1624 09/24/15 0739  GLUCAP 112* 117* 123* 112* 148*    Microbiology: Results for orders placed or performed during the hospital  encounter of 10/01/2015  Blood culture (routine x 2)     Status: None   Collection Time: 10-01-2015 11:53 PM  Result Value Ref Range Status   Specimen Description BLOOD LEFT HAND  Final   Special Requests BOTTLES DRAWN AEROBIC AND ANAEROBIC 4CC  Final   Culture NO GROWTH 5 DAYS  Final   Report Status 09/23/2015 FINAL  Final  Blood culture (routine x 2)     Status: None   Collection Time: 01-Oct-2015 11:53 PM  Result Value Ref Range Status   Specimen Description BLOOD LEFT ARM  Final   Special Requests BOTTLES DRAWN AEROBIC AND ANAEROBIC 5CC  Final   Culture NO GROWTH 5 DAYS  Final   Report Status 09/23/2015 FINAL  Final  Urine culture     Status: None   Collection Time: 09/18/15 12:21 AM  Result Value Ref Range Status   Specimen Description URINE, RANDOM  Final   Special Requests NONE  Final   Culture MULTIPLE SPECIES PRESENT, SUGGEST RECOLLECTION  Final   Report Status 09/19/2015 FINAL  Final  Rapid Influenza A&B Antigens ([facility] only)     Status: None    Collection Time: 09/18/15  6:26 AM  Result Value Ref Range Status   Influenza A ([facility]) NOT DETECTED  Final   Influenza B ([facility]) NOT DETECTED  Final  MRSA PCR Screening     Status: None   Collection Time: 09/20/15  6:30 AM  Result Value Ref Range Status   MRSA by PCR NEGATIVE NEGATIVE Final    Comment:        The GeneXpert MRSA Assay (FDA approved for NASAL specimens only), is one component of a comprehensive MRSA colonization surveillance program. It is not intended to diagnose MRSA infection nor to guide or monitor treatment for MRSA infections.   Culture, respiratory (NON-Expectorated)     Status: None (Preliminary result)   Collection Time: 09/22/15  3:13 PM  Result Value Ref Range Status   Specimen Description TRACHEAL ASPIRATE  Final   Special Requests NONE  Final   Gram Stain   Final    GOOD SPECIMEN - 80-90% WBCS MODERATE WBC SEEN RARE YEAST RARE GRAM NEGATIVE COCCOBACILLI    Culture TOO YOUNG TO READ  Final   Report Status PENDING  Incomplete  Urine culture     Status: None   Collection Time: 09/22/15 11:16 PM  Result Value Ref Range Status   Specimen Description URINE, RANDOM  Final   Special Requests NONE  Final   Culture NO GROWTH 2 DAYS  Final   Report Status 09/24/2015 FINAL  Final  CULTURE, BLOOD (ROUTINE X 2) w Reflex to PCR ID Panel     Status: None (Preliminary result)   Collection Time: 09/22/15 11:44 PM  Result Value Ref Range Status   Specimen Description BLOOD RIGHT ANTECUBITAL  Final   Special Requests BOTTLES DRAWN AEROBIC AND ANAEROBIC  Final   Culture NO GROWTH 1 DAY  Final   Report Status PENDING  Incomplete  CULTURE, BLOOD (ROUTINE X 2) w Reflex to PCR ID Panel     Status: None (Preliminary result)   Collection Time: 09/22/15 11:56 PM  Result Value Ref Range Status   Specimen Description BLOOD LEFT HAND  Final   Special Requests BOTTLES DRAWN AEROBIC AND ANAEROBIC  Final   Culture NO GROWTH 1 DAY  Final   Report Status PENDING   Incomplete  Culture, respiratory (NON-Expectorated)     Status: None (Preliminary result)  Collection Time: 09/23/15 12:01 AM  Result Value Ref Range Status   Specimen Description TRACHEAL ASPIRATE  Final   Special Requests NONE  Final   Gram Stain   Final    GOOD SPECIMEN - 80-90% WBCS MODERATE WBC SEEN RARE YEAST RARE GRAM POSITIVE COCCI    Culture PENDING  Incomplete   Report Status PENDING  Incomplete    Coagulation Studies: No results for input(s): LABPROT, INR in the last 72 hours.  Urinalysis: No results for input(s): COLORURINE, LABSPEC, PHURINE, GLUCOSEU, HGBUR, BILIRUBINUR, KETONESUR, PROTEINUR, UROBILINOGEN, NITRITE, LEUKOCYTESUR in the last 72 hours.  Invalid input(s): APPERANCEUR    Imaging: Dg Chest Port 1 View  09/24/2015  CLINICAL DATA:  Respiratory failure. EXAM: PORTABLE CHEST 1 VIEW COMPARISON:  Chest x-rays from 2015-05-27 to 09/23/2015 FINDINGS: Endotracheal tube, central line, and OG tube appear unchanged. Persistent increased density at both lung bases, most likely pleural effusions and atelectasis. Fluid along the fissures in the right hemi thorax. New linear area of atelectasis in the right lung apex. Cardiomegaly with enlargement of the main pulmonary artery. IMPRESSION: New linear atelectasis in the right lung apex. Persistent bibasilar atelectasis and effusions. Electronically Signed   By: Francene BoyersJames  Maxwell M.D.   On: 09/24/2015 07:53   Dg Chest Port 1 View  09/23/2015  CLINICAL DATA:  Respiratory failure, pneumonia, CHF, acute encephalopathy, current smoker. EXAM: PORTABLE CHEST 1 VIEW COMPARISON:  Portable chest x-ray of September 21, 2014 FINDINGS: The lungs remain mildly hypoinflated. The cardiac silhouette remains enlarged. The pulmonary vascularity remains engorged. The retrocardiac region remains dense and the left hemidiaphragm is nearly totally obscured. Hazy alveolar opacity in the right lung persists. The endotracheal tube tip lies 6 cm above the carina.  The esophagogastric tube tip projects below the inferior margin of the image. The left internal jugular venous catheter tip projects over the proximal SVC. IMPRESSION: CHF with pulmonary interstitial and mild alveolar edema. Persistent left lower lobe atelectasis or pneumonia. There has not been significant interval change since the previous study. The support tubes are in reasonable position. Electronically Signed   By: David  SwazilandJordan M.D.   On: 09/23/2015 07:39     Medications:   . dexmedetomidine 1.2 mcg/kg/hr (09/24/15 0910)  . feeding supplement (VITAL HIGH PROTEIN) 1,000 mL (09/24/15 0700)  . fentaNYL infusion INTRAVENOUS 20 mcg/hr (09/24/15 0700)  . norepinephrine (LEVOPHED) Adult infusion Stopped (09/23/15 1800)   . antiseptic oral rinse  7 mL Mouth Rinse 10 times per day  . budesonide  0.25 mg Nebulization 4 times per day  . cefTAZidime (FORTAZ)  IV  2 g Intravenous 3 times per day  . chlorhexidine gluconate  15 mL Mouth Rinse BID  . docusate  100 mg Per Tube BID  . enoxaparin (LOVENOX) injection  40 mg Subcutaneous Q12H  . famotidine  20 mg Per Tube Daily  . feeding supplement (PRO-STAT SUGAR FREE 64)  30 mL Oral 6 times per day  . free water  200 mL Per Tube 3 times per day  . ipratropium-albuterol  3 mL Nebulization Q6H  . nystatin   Topical BID  . sodium chloride  3 mL Intravenous Q12H  . sodium chloride  3 mL Intravenous Q12H  . vancomycin  1,250 mg Intravenous Q8H   sodium chloride, acetaminophen **OR** acetaminophen, albuterol, calcium carbonate, LORazepam, ondansetron **OR** ondansetron (ZOFRAN) IV, polyethylene glycol, sodium chloride, sodium chloride  Assessment/ Plan:  Mr. Mitchell Morrow is a 40 y.o. black male with morbid obesity, tobacco abuse, without  prescribed home medications, who was admitted to [facility] on 09-19-15   1. Acute Renal Failure: nonoliguric urine output. Baseline creatinine of 1.12 on admission.  Several episodes of hypotension, overdiuresis and  currently with sepsis.  No acute indication for dialysis however will monitor urine output, renal function, electrolytes and volume status.  - Renally dose all medications No use in a renal ultrasound due to patient's body habitus and critical illness.  - continue to hold diuretics. Hold IV fluids, patient is overloaded.   2. Sepsis: febrile. Concerning for worsening pneumonia. Blood cultures have been negative. wbc stabilized.  - broad spectrum antibiotics: ceftaz and vanc - monitor vanc level. Check random vanco level today.  - hemodynamically unstable requiring vasopressors. Now off norepinephrine  3. Acute respiratory failure with bilateral pneumonia and acute exacerbation of systolic congestive heart failure: not currently on diuretics. CHF is a new diagnosis on this admission    LOS: 7 Mitchell Morrow 1/6/20179:39 AM

## 2015-09-24 NOTE — Progress Notes (Signed)
Checked patients residual at 0400 and it was .  Checked orders and there was no instructions on what to do with increased residual specifically so gave patient back residual and reported off to day shift nurse to see if MD's would like to start reglan or something to promote GI motility.

## 2015-09-24 NOTE — Progress Notes (Signed)
PULMONARY/CCM PROGRESS NOTE  Active problems: Acute ventilator dependent hypercapneic respiratory failure Vasopressor dependent hypotension AKI - baseline Cr 1.06 Morbid obesity Smoker Cor pulmonale Hyperbilirubinemia - likely cholestasis Thrombocytopenia - appears chronic  Lines, Tubes, etc: ETT 01/02 >>  L IJ CVL 01/02 >>   Microbiology: Blood 12/30 >>  MRSA PCR 01/02 >> NEG Urine 01/04 >> NEG Resp 01/04 >> rare Gram neg coccobacilli >>  Resp 01/05 >> rare GPCs >>  Blood 01/04 >>    PCT 01/04: 0.74  Antibiotics:  Levofloxacin 12/30 >> 01/01 Ceftriaxone 01/02 >> 01/02 Azithro 01/02 >> 01/02 Levofloxacin 01/04 >> 01/05 Vanc 01/05 >>  Ceftaz 01/05 >>   PCT 01/04: 0.74, 0.95, 0.77  Studies/Events: 12/31 TTE:  poor quality study, LVEF 45% 12/31 RUQ: Nodular contour the liver raising the question of cirrhosis. Gallbladder wall thickening, nonspecific in appearance 01/04 night - fever, hypotension. Cultures obtained. Abx expanded  Consultants:  Renal 01/05: no acute indication for HD   Best Practice: DVT: enoxaparin SUP: enteral famotidine Nutrition: TFs Glycemic control: N/I Sedation/analgesia: fent/dexmedetomidine infusion. PRN lorazepam    Subj: RASS -2, + F/C on WUA  Obj: Filed Vitals:   09/24/15 0700 09/24/15 0724 09/24/15 0800 09/24/15 0813  BP: 95/74  96/77   Pulse: 77  78   Temp:    99.4 F (37.4 C)  TempSrc:    Oral  Resp: 18  18   Height:      Weight:      SpO2: 97% 97% 96%     Gen: morbidly obese HEENT:  WNL Neck: CVL site clean, JVP cannot be assessed Chest: coarse BS without true wheezes, somewhat prolonged exp time Cardiac: distant HS, regular, no M noted Abd: obese, soft, + BS Ext: symmetric edema, chronic stasis changes  BMET    Component Value Date/Time   NA 139 09/24/2015 0512   K 4.1 09/24/2015 0512   CL 96* 09/24/2015 0512   CO2 36* 09/24/2015 0512   GLUCOSE 127* 09/24/2015 0512   BUN 56* 09/24/2015 0512   CREATININE 1.61* 09/24/2015 0512   CALCIUM 8.3* 09/24/2015 0512   GFRNONAA 52* 09/24/2015 0512   GFRAA 60* 09/24/2015 0512    CBC    Component Value Date/Time   WBC 10.6 09/24/2015 0512   RBC 5.55 09/24/2015 0512   HGB 14.0 09/24/2015 0512   HCT 45.9 09/24/2015 0512   PLT 71* 09/24/2015 0512   MCV 82.7 09/24/2015 0512   MCH 25.3* 09/24/2015 0512   MCHC 30.6* 09/24/2015 0512   RDW 19.6* 09/24/2015 0512   LYMPHSABS 2.1 09/24/2015 0512   MONOABS 1.5* 09/24/2015 0512   EOSABS 0.3 09/24/2015 0512   BASOSABS 0.0 09/24/2015 0512    CXR:  New linear atelectasis in the right lung apex. Persistent bibasilar atelectasis and effusions   IMPRESSION: 1) Acute on chronic hypercarbic respiratory failure - multifactorial 2) Obesity hypoventilation syndrome 3) Atelectasis 4) Pulm edema vs ALI/ARDS 5) Fever, hypotension, suspect severe sepsis 01/05 - likely due to PNA 6) Hypotension resolved 7) AKI, non-oliguric - Cr improving 01/06 8) Mild hyperglycemia without prior dx of DM 9) Elevated bilirubin - likely cholestasis. Rest of LFTs normal. RUQ [place] unrevealing 10) Thrombocytopenia, unknown chronicity - stable 11) ICU associated discomfort  PLAN/RECS:  Cont vent support - settings reviewed and/or adjusted Wean in PSV as tolerated Cont vent bundle Follow CXR intermittently Daily SBT if/when meets criteria MAP goal > 65 mmHg Monitor BMET intermittently Monitor I/Os Correct electrolytes as indicated Cont TFs Bowel regimen  initiated 01/06 Monitor glucose. Consider SSI for glu > 180 Monitor Hepatic function panel intermittently Monitor temp, WBC count Micro and abx as above Monitor CBC intermittently RASS goal -1 Changed propofol to dexmedetomidine 01/05 - tolerating well Will change fentanyl infusion to intermittent boluses 01/06  I still have not heard from or seen any family members  CCM time: 35 mins The above time includes time spent reviewing care plan on multidisciplinary  rounds  Billy Fischer, MD PCCM service Mobile [phone] Pager [phone]

## 2015-09-25 ENCOUNTER — Inpatient Hospital Stay: Payer: Medicaid Other

## 2015-09-25 ENCOUNTER — Inpatient Hospital Stay: Payer: Medicaid Other | Admitting: Anesthesiology

## 2015-09-25 LAB — RENAL FUNCTION PANEL
Albumin: 2.7 g/dL — ABNORMAL LOW (ref 3.5–5.0)
Anion gap: 5 (ref 5–15)
BUN: 50 mg/dL — AB (ref 6–20)
CHLORIDE: 98 mmol/L — AB (ref 101–111)
CO2: 38 mmol/L — ABNORMAL HIGH (ref 22–32)
Calcium: 8.2 mg/dL — ABNORMAL LOW (ref 8.9–10.3)
Creatinine, Ser: 1.16 mg/dL (ref 0.61–1.24)
GLUCOSE: 130 mg/dL — AB (ref 65–99)
POTASSIUM: 3.9 mmol/L (ref 3.5–5.1)
Phosphorus: 2.7 mg/dL (ref 2.5–4.6)
Sodium: 141 mmol/L (ref 135–145)

## 2015-09-25 LAB — PROCALCITONIN: Procalcitonin: 0.75 ng/mL

## 2015-09-25 LAB — CULTURE, RESPIRATORY

## 2015-09-25 LAB — CBC
HCT: 44.3 % (ref 40.0–52.0)
HEMOGLOBIN: 13.6 g/dL (ref 13.0–18.0)
MCH: 25.3 pg — AB (ref 26.0–34.0)
MCHC: 30.6 g/dL — ABNORMAL LOW (ref 32.0–36.0)
MCV: 82.7 fL (ref 80.0–100.0)
PLATELETS: 76 10*3/uL — AB (ref 150–440)
RBC: 5.36 MIL/uL (ref 4.40–5.90)
RDW: 19.6 % — ABNORMAL HIGH (ref 11.5–14.5)
WBC: 10.5 10*3/uL (ref 3.8–10.6)

## 2015-09-25 LAB — CULTURE, RESPIRATORY W GRAM STAIN: Culture: NORMAL

## 2015-09-25 LAB — GLUCOSE, CAPILLARY: GLUCOSE-CAPILLARY: 89 mg/dL (ref 65–99)

## 2015-09-25 MED ORDER — ETOMIDATE 2 MG/ML IV SOLN
20.0000 mg/kg | Freq: Once | INTRAVENOUS | Status: AC
  Administered 2015-09-25: 1414 mg via INTRAVENOUS
  Filled 2015-09-25: qty 707

## 2015-09-25 MED ORDER — PROPOFOL 1000 MG/100ML IV EMUL
5.0000 ug/kg/min | INTRAVENOUS | Status: DC
  Administered 2015-09-25 (×2): 45 ug/kg/min via INTRAVENOUS
  Administered 2015-09-25: 35 ug/kg/min via INTRAVENOUS
  Administered 2015-09-25 (×2): 45 ug/kg/min via INTRAVENOUS
  Administered 2015-09-26 – 2015-09-27 (×9): 30 ug/kg/min via INTRAVENOUS
  Administered 2015-09-27 (×2): 20 ug/kg/min via INTRAVENOUS
  Administered 2015-09-27: 15 ug/kg/min via INTRAVENOUS
  Administered 2015-09-27 (×4): 30 ug/kg/min via INTRAVENOUS
  Administered 2015-09-28 (×3): 35 ug/kg/min via INTRAVENOUS
  Administered 2015-09-28: 55 ug/kg/min via INTRAVENOUS
  Administered 2015-09-28: 35 ug/kg/min via INTRAVENOUS
  Administered 2015-09-28: 25 ug/kg/min via INTRAVENOUS
  Administered 2015-09-28: 35 ug/kg/min via INTRAVENOUS
  Administered 2015-09-28: 30 ug/kg/min via INTRAVENOUS
  Administered 2015-09-28: 50 ug/kg/min via INTRAVENOUS
  Administered 2015-09-28: 30 ug/kg/min via INTRAVENOUS
  Administered 2015-09-28: 15 ug/kg/min via INTRAVENOUS
  Administered 2015-09-28 – 2015-09-29 (×5): 20 ug/kg/min via INTRAVENOUS
  Filled 2015-09-25 (×42): qty 100

## 2015-09-25 MED ORDER — VANCOMYCIN HCL 10 G IV SOLR
1250.0000 mg | INTRAVENOUS | Status: DC
  Administered 2015-09-25: 1250 mg via INTRAVENOUS
  Filled 2015-09-25: qty 1250

## 2015-09-25 MED ORDER — PROPOFOL 1000 MG/100ML IV EMUL
INTRAVENOUS | Status: AC
  Administered 2015-09-25: 45 ug/kg/min via INTRAVENOUS
  Filled 2015-09-25: qty 100

## 2015-09-25 MED ORDER — VANCOMYCIN HCL 10 G IV SOLR
1250.0000 mg | Freq: Three times a day (TID) | INTRAVENOUS | Status: DC
  Administered 2015-09-25 (×2): 1250 mg via INTRAVENOUS
  Filled 2015-09-25 (×4): qty 1250

## 2015-09-25 MED ORDER — ETOMIDATE 2 MG/ML IV SOLN
0.3000 mg/kg | Freq: Once | INTRAVENOUS | Status: DC
  Filled 2015-09-25: qty 37.85

## 2015-09-25 NOTE — Progress Notes (Signed)
Pt failed wean this morning. Self extubated this afternoon . Difficult re-intubation. Precedex weaned off and started on Propofol for sedation. Foley in place. OG tube placed. Abdominal XRay to check placement.

## 2015-09-25 NOTE — Progress Notes (Addendum)
PULMONARY/CCM PROGRESS NOTE  Active problems: Acute ventilator dependent hypercapneic respiratory failure Vasopressor dependent hypotension AKI - baseline Cr 1.06, continue to monitor.  Morbid obesity Smoker Cor pulmonale Hyperbilirubinemia - likely cholestasis Thrombocytopenia - appears chronic  Lines, Tubes, etc: ETT 01/02 >>  L IJ CVL 01/02 >>   Microbiology: Blood 12/30 >>  MRSA PCR 01/02 >> NEG Urine 01/04 >> NEG Resp 01/04 >> rare Gram neg coccobacilli >>  Resp 01/05 >> rare GPCs >>  Blood 01/04 >>    PCT 01/04: 0.74  Antibiotics:  Levofloxacin 12/30 >> 01/01 Ceftriaxone 01/02 >> 01/02 Azithro 01/02 >> 01/02 Levofloxacin 01/04 >> 01/05 Vanc 01/05 >>  Ceftaz 01/05 >>   PCT 01/04: 0.74, 0.95, 0.77  Studies/Events: 12/31 TTE:  poor quality study, LVEF 45% 12/31 RUQ: Nodular contour the liver raising the question of cirrhosis. Gallbladder wall thickening, nonspecific in appearance 01/04 night - fever, hypotension. Cultures obtained. Abx expanded  Consultants:  Renal 01/05: no acute indication for HD   Best Practice: DVT: enoxaparin SUP: enteral famotidine Nutrition: TFs Glycemic control: N/I Sedation/analgesia: fent/dexmedetomidine infusion. PRN lorazepam    Subj: RASS -2, + F/C on WUA  Obj: Filed Vitals:   09/25/15 1100 09/25/15 1200 09/25/15 1300 09/25/15 1419  BP: 99/77 96/78 98/76    Pulse: 93 95 95   Temp: 98.7 F (37.1 C)     TempSrc:      Resp: 21 18 19    Height:      Weight:      SpO2: 93% 93% 92% 93%    Gen: morbidly obese HEENT:  WNL Neck: CVL site clean, JVP cannot be assessed Chest: clear today somewhat prolonged exp time Cardiac: distant HS, regular, no M noted Abd: obese, soft, + BS Ext: symmetric edema, chronic stasis changes  BMET    Component Value Date/Time   NA 141 09/25/2015 0510   K 3.9 09/25/2015 0510   CL 98* 09/25/2015 0510   CO2 38* 09/25/2015 0510   GLUCOSE 130* 09/25/2015 0510   BUN 50* 09/25/2015  0510   CREATININE 1.16 09/25/2015 0510   CALCIUM 8.2* 09/25/2015 0510   GFRNONAA >60 09/25/2015 0510   GFRAA >60 09/25/2015 0510    CBC    Component Value Date/Time   WBC 10.5 09/25/2015 0510   RBC 5.36 09/25/2015 0510   HGB 13.6 09/25/2015 0510   HCT 44.3 09/25/2015 0510   PLT 76* 09/25/2015 0510   MCV 82.7 09/25/2015 0510   MCH 25.3* 09/25/2015 0510   MCHC 30.6* 09/25/2015 0510   RDW 19.6* 09/25/2015 0510   LYMPHSABS 2.1 09/24/2015 0512   MONOABS 1.5* 09/24/2015 0512   EOSABS 0.3 09/24/2015 0512   BASOSABS 0.0 09/24/2015 0512    CXR:  New linear atelectasis in the right lung apex. Persistent bibasilar atelectasis and effusions   IMPRESSION: 1) Acute on chronic hypercarbic respiratory failure - multifactorial 2) Obesity hypoventilation syndrome 3) Atelectasis 4) Pulm edema vs ALI/ARDS 5) Fever, hypotension, suspect severe sepsis 01/05 - likely due to PNA 6) Hypotension resolved 7) AKI, non-oliguric - Cr improving 01/06 8) Mild hyperglycemia without prior dx of DM 9) Elevated bilirubin - likely cholestasis. Rest of LFTs normal. RUQ [place] unrevealing 10) Thrombocytopenia, unknown chronicity - stable 11) ICU associated discomfort  PLAN/RECS:  Cont vent support - settings reviewed and/or adjusted Wean in PSV as tolerated, failed SBT today, will try again tomorrow.  Cont vent bundle Follow CXR intermittently Daily SBT if/when meets criteria MAP goal > 65 mmHg Monitor BMET intermittently  Monitor I/Os Correct electrolytes as indicated Cont TFs Bowel regimen initiated 01/06--appears at goal, will continue.  Monitor glucose. Consider SSI for glu > 180  RASS goal -1  Addendum:  And that the patient extubated himself. Subsequently, the patient was awake but his mental status appeared somewhat altered. He is artery failed a weaning trial earlier this morning. We discussed that he needs to go back on the ventilator or he might pass away. He said he did not want to go on the  ventilator. Initially, but subsequently agreed. I made to attempt to int[facility] the [facility], the vocal cords are visualized, but the endotracheal tube could not be passed, subsequently anesthesia was called. Please see their note for further details, the patient was eventually intubated using a Bucci with a side 7 tube.  CCM time: 35 mins The above time includes time spent reviewing care plan on multidisciplinary rounds  Mitchell Morrow Araceli Arango, M.D.

## 2015-09-25 NOTE — Progress Notes (Signed)
[facility]  ROUNDING NOTE   Subjective:   Failed weaning trial. Back on PRVC  UOP 3650 Creatinine1.16   Objective:  Vital signs in last 24 hours:  Temp:  [97.5 F (36.4 C)-98.8 F (37.1 C)] 98.7 F (37.1 C) (01/07 1100) Pulse Rate:  [74-105] 95 (01/07 1400) Resp:  [16-21] 20 (01/07 1400) BP: (92-113)/(64-87) 110/85 mmHg (01/07 1400) SpO2:  [92 %-98 %] 93 % (01/07 1419) FiO2 (%):  [40 %] 40 % (01/07 1419) Weight:  [252.3 kg (556 lb 3.5 oz)] 252.3 kg (556 lb 3.5 oz) (01/07 0452)  Weight change: 1.6 kg (3 lb 8.4 oz) Filed Weights   09/23/15 0500 09/24/15 0500 09/25/15 0452  Weight: 250.839 kg (553 lb) 250.7 kg (552 lb 11.1 oz) 252.3 kg (556 lb 3.5 oz)    Intake/Output: I/O last 3 completed shifts: In: 5620.1 [I.V.:2812.1; NG/GT:2308; IV Piggyback:500] Out: 4900 [Urine:4900]   Intake/Output this shift:     Physical Exam: General: Critically ill  Head: +ETT, +NGT  Eyes: Anicteric, PERRL  Neck: Left IJ TLC catheter  Lungs:  Decreased breath sounds, intubated, ventilated PRVC 40% PEEP 5  Heart: regular  Abdomen:  Soft, nontender, obese  Extremities: 2+ peripheral and dependent edema.  Neurologic: Sedated, intubated  Skin: No lesions  GU: foley    Basic Metabolic Panel:  Recent Labs Lab 09/21/15 0432 09/22/15 0318 09/23/15 0447 09/24/15 0512 09/25/15 0510  NA 138 138 136 139 141  K 3.5 4.4 4.1 4.1 3.9  CL 94* 94* 93* 96* 98*  CO2 35* 37* 35* 36* 38*  GLUCOSE 146* 135* 111* 127* 130*  BUN 21* 31* 52* 56* 50*  CREATININE 1.55* 1.93* 2.31* 1.61* 1.16  CALCIUM 8.4* 8.7* 8.5* 8.3* 8.2*  PHOS  --   --   --   --  2.7    Liver Function Tests:  Recent Labs Lab 09/19/15 0436 09/22/15 0318 09/23/15 0447 09/24/15 0512 09/25/15 0510  AST 25 19 21 19   --   ALT 12* 10* 10* 10*  --   ALKPHOS 87 71 70 55  --   BILITOT 2.8* 3.5* 3.8* 4.1*  --   PROT 7.5 7.3 7.3 6.6  --   ALBUMIN 3.6 3.2* 3.3* 3.0* 2.7*   No results for input(s): LIPASE,  AMYLASE in the last 168 hours. No results for input(s): AMMONIA in the last 168 hours.  CBC:  Recent Labs Lab 09/21/15 0432 09/22/15 0318 09/23/15 0447 09/24/15 0512 09/25/15 0510  WBC 7.8 8.8 10.2 10.6 10.5  NEUTROABS  --   --   --  6.7*  --   HGB 14.0 13.8 14.1 14.0 13.6  HCT 43.9 44.6 46.4 45.9 44.3  MCV 81.7 82.6 83.0 82.7 82.7  PLT 62* 77* 83* 71* 76*    Cardiac Enzymes:  Recent Labs Lab 09/22/15 2224 09/23/15 0447 09/23/15 0848  TROPONINI <0.03 <0.03 <0.03    BNP: Invalid input(s): POCBNP  CBG:  Recent Labs Lab 09/23/15 1624 09/24/15 0739 09/24/15 1111 09/24/15 1557 09/24/15 2336  GLUCAP 112* 148* 117* 133* 161*    Microbiology: Results for orders placed or performed during the hospital encounter of 09/24/2015  Blood culture (routine x 2)     Status: None   Collection Time: October 04, 2015 11:53 PM  Result Value Ref Range Status   Specimen Description BLOOD LEFT HAND  Final   Special Requests BOTTLES DRAWN AEROBIC AND ANAEROBIC 4CC  Final   Culture NO GROWTH 5 DAYS  Final   Report Status  09/23/2015 FINAL  Final  Blood culture (routine x 2)     Status: None   Collection Time: 08/30/2015 11:53 PM  Result Value Ref Range Status   Specimen Description BLOOD LEFT ARM  Final   Special Requests BOTTLES DRAWN AEROBIC AND ANAEROBIC 5CC  Final   Culture NO GROWTH 5 DAYS  Final   Report Status 09/23/2015 FINAL  Final  Urine culture     Status: None   Collection Time: 09/18/15 12:21 AM  Result Value Ref Range Status   Specimen Description URINE, RANDOM  Final   Special Requests NONE  Final   Culture MULTIPLE SPECIES PRESENT, SUGGEST RECOLLECTION  Final   Report Status 09/19/2015 FINAL  Final  Rapid Influenza A&B Antigens ([facility] only)     Status: None   Collection Time: 09/18/15  6:26 AM  Result Value Ref Range Status   Influenza A ([facility]) NOT DETECTED  Final   Influenza B ([facility]) NOT DETECTED  Final  MRSA PCR Screening     Status: None   Collection Time: 09/20/15   6:30 AM  Result Value Ref Range Status   MRSA by PCR NEGATIVE NEGATIVE Final    Comment:        The GeneXpert MRSA Assay (FDA approved for NASAL specimens only), is one component of a comprehensive MRSA colonization surveillance program. It is not intended to diagnose MRSA infection nor to guide or monitor treatment for MRSA infections.   Culture, respiratory (NON-Expectorated)     Status: None (Preliminary result)   Collection Time: 09/22/15  3:13 PM  Result Value Ref Range Status   Specimen Description TRACHEAL ASPIRATE  Final   Special Requests NONE  Final   Gram Stain   Final    GOOD SPECIMEN - 80-90% WBCS MODERATE WBC SEEN RARE YEAST RARE GRAM NEGATIVE COCCOBACILLI    Culture HOLDING FOR POSSIBLE PATHOGEN  Final   Report Status PENDING  Incomplete  Urine culture     Status: None   Collection Time: 09/22/15 11:16 PM  Result Value Ref Range Status   Specimen Description URINE, RANDOM  Final   Special Requests NONE  Final   Culture NO GROWTH 2 DAYS  Final   Report Status 09/24/2015 FINAL  Final  CULTURE, BLOOD (ROUTINE X 2) w Reflex to PCR ID Panel     Status: None (Preliminary result)   Collection Time: 09/22/15 11:44 PM  Result Value Ref Range Status   Specimen Description BLOOD RIGHT ANTECUBITAL  Final   Special Requests BOTTLES DRAWN AEROBIC AND ANAEROBIC  Final   Culture NO GROWTH 2 DAYS  Final   Report Status PENDING  Incomplete  CULTURE, BLOOD (ROUTINE X 2) w Reflex to PCR ID Panel     Status: None (Preliminary result)   Collection Time: 09/22/15 11:56 PM  Result Value Ref Range Status   Specimen Description BLOOD LEFT HAND  Final   Special Requests BOTTLES DRAWN AEROBIC AND ANAEROBIC  Final   Culture NO GROWTH 2 DAYS  Final   Report Status PENDING  Incomplete  Culture, respiratory (NON-Expectorated)     Status: None   Collection Time: 09/23/15 12:01 AM  Result Value Ref Range Status   Specimen Description TRACHEAL ASPIRATE  Final   Special  Requests NONE  Final   Gram Stain   Final    GOOD SPECIMEN - 80-90% WBCS MODERATE WBC SEEN RARE YEAST RARE GRAM POSITIVE COCCI    Culture Consistent with normal respiratory flora.  Final  Report Status 09/25/2015 FINAL  Final    Coagulation Studies: No results for input(s): LABPROT, INR in the last 72 hours.  Urinalysis: No results for input(s): COLORURINE, LABSPEC, PHURINE, GLUCOSEU, HGBUR, BILIRUBINUR, KETONESUR, PROTEINUR, UROBILINOGEN, NITRITE, LEUKOCYTESUR in the last 72 hours.  Invalid input(s): APPERANCEUR    Imaging: Dg Chest Port 1 View  09/24/2015  CLINICAL DATA:  Respiratory failure. EXAM: PORTABLE CHEST 1 VIEW COMPARISON:  Chest x-rays from Feb 06, 2015 to 09/23/2015 FINDINGS: Endotracheal tube, central line, and OG tube appear unchanged. Persistent increased density at both lung bases, most likely pleural effusions and atelectasis. Fluid along the fissures in the right hemi thorax. New linear area of atelectasis in the right lung apex. Cardiomegaly with enlargement of the main pulmonary artery. IMPRESSION: New linear atelectasis in the right lung apex. Persistent bibasilar atelectasis and effusions. Electronically Signed   By: Francene BoyersJames  Maxwell M.D.   On: 09/24/2015 07:53     Medications:   . dexmedetomidine 1.2 mcg/kg/hr (09/25/15 1437)  . feeding supplement (VITAL HIGH PROTEIN) 1,000 mL (09/24/15 1713)  . norepinephrine (LEVOPHED) Adult infusion Stopped (09/23/15 1800)   . antiseptic oral rinse  7 mL Mouth Rinse 10 times per day  . budesonide  0.25 mg Nebulization 4 times per day  . cefTAZidime (FORTAZ)  IV  2 g Intravenous 3 times per day  . chlorhexidine gluconate  15 mL Mouth Rinse BID  . docusate  100 mg Per Tube BID  . enoxaparin (LOVENOX) injection  40 mg Subcutaneous Q12H  . famotidine  20 mg Per Tube Daily  . feeding supplement (PRO-STAT SUGAR FREE 64)  30 mL Oral 6 times per day  . free water  200 mL Per Tube 3 times per day  . ipratropium-albuterol  3 mL  Nebulization Q6H  . nystatin   Topical BID  . polyethylene glycol  17 g Per Tube Daily  . propofol      . senna  2 tablet Per Tube Daily  . sodium chloride  3 mL Intravenous Q12H  . sodium chloride  3 mL Intravenous Q12H  . vancomycin  1,250 mg Intravenous Q8H   sodium chloride, acetaminophen **OR** acetaminophen, albuterol, calcium carbonate, fentaNYL (SUBLIMAZE) injection, LORazepam, ondansetron **OR** ondansetron (ZOFRAN) IV, polyethylene glycol, sodium chloride, sodium chloride  Assessment/ Plan:  Mr. Tonna Cornerherlo Morrow is a 40 y.o. black male with morbid obesity, tobacco abuse, without prescribed home medications, who [facility]   1. Acute Renal Failure: nonoliguric urine output. Baseline creatinine of 1.12 on admission. Now back to baseline. Several episodes of hypotension, overdiuresis and currently with sepsis.  - Renally dose all medications No use in a renal ultrasound due to patient's body habitus and critical illness.  - continue to hold diuretics. Hold IV fluids, patient is overloaded.   2. Sepsis: febrile. Concerning for worsening pneumonia. Blood cultures have been negative. wbc stabilized.  - broad spectrum antibiotics: ceftaz and vanc - monitor vanc level. Check random vanco level today.  - hemodynamically unstable requiring vasopressors. Now off norepinephrine  3. Acute respiratory failure with bilateral pneumonia and acute exacerbation of systolic congestive heart failure: not currently on diuretics. CHF is a new diagnosis on this admission    LOS: 8 Davonna Ertl 1/7/20173:37 PM

## 2015-09-25 NOTE — Anesthesia Procedure Notes (Signed)
Procedure Name: Intubation Date/Time: 09/25/2015 3:20 PM Performed by: Naomie DeanKEPHART, Valentine Kuechle K Pre-anesthesia Checklist: Patient identified, Suction available and Patient being monitored Oxygen Delivery Method: Ambu bag Preoxygenation: Pre-oxygenation with 100% oxygen Ventilation: Mask ventilation with difficulty Laryngoscope Size: Glidescope and 3 Grade View: Grade II Tube type: Oral Tube size: 7.5 mm Number of attempts: 3 Airway Equipment and Method: Stylet and Bougie stylet Placement Confirmation: CO2 detector Difficulty Due To: Difficult Airway- due to large tongue and Difficulty was anticipated Comments: Called to see pt. In ICU who had self-extubated. Several previous attempts at intubation unsuccessful. Glidescope used cords visualized, unable to pass 8.0 ETT. 2nd attempt with 7.5 ETT still unsuccessful. Bogie used able to enter airway. 7.5 ETT passed over Boogie with some difficulty. Positive color change on CO2 detector. Tube secured by respiratory tx.

## 2015-09-25 NOTE — Progress Notes (Signed)
RT called to room by Trula Orehristina, RN who explained patient had extubated himself.  RT arrived to room at 1458 to find patient extubated and O2 sats in 70's.  Patient placed on 100% NRB with return of sats to 98%.  Dr. Ardyth Manam into room to assess patient, decision made to reintubate. Patient bagged with BVM maintaining sats in high 90's with occasional dips between intubation attempts to 70's.  Dr. Ardyth Manam attempted intubation with glidoscope mac 4 and 8.0 ETT (size patient had previously) times 3 unsuccessful, Dr. Henrene HawkingKephart from anesthesia called and into room.  Dr. Henrene HawkingKephart attempted once with 8.0 ETT and then once with 7.5 ETT without success.  Dr. Henrene HawkingKephart obtained bougie and was able to intubate successfully with 7.5 ETT on first attempt. Patient placed back on servo vent with prior settings.

## 2015-09-25 NOTE — Progress Notes (Signed)
Nutrition Follow-up    INTERVENTION:   EN: continue current TF regimen as per order set as tolerated by pt Coordination of Care: if continues without BM, may benefit from suppository and/or enema added to bowel regimen   NUTRITION DIAGNOSIS:   Inadequate oral intake related to inability to eat as evidenced by NPO status.  GOAL:   Patient will meet greater than or equal to 90% of their needs  MONITOR:    (Energy Intake, Digestive System, Pulmonary Profile, Electrolyte and Renal Profile, Anthropometrics)  REASON FOR ASSESSMENT:   Ventilator, Consult Enteral/tube feeding initiation and management  ASSESSMENT:   Pt admitted with acute hypoxic and hypercapnic resp failure with acute metabolic encephalopathy from cocaine abuse leading to CHF and encephalopathy per MD note. Pt intubated this am.  Pt remains on vent  EN: tolerating Vital High Protein at rate of 50 ml/hr  Digestive System: no signs of TF intolerance, constipated Skin:  Reviewed, no issues  Last BM:  09/16/2015   Electrolyte and Renal Profile:  Recent Labs Lab 09/23/15 0447 09/24/15 0512 09/25/15 0510  BUN 52* 56* 50*  CREATININE 2.31* 1.61* 1.16  NA 136 139 141  K 4.1 4.1 3.9  PHOS  --   --  2.7   Glucose Profile:  Recent Labs  09/24/15 1111 09/24/15 1557 09/24/15 2336  GLUCAP 117* 133* 161*   Meds:colace bid, miralax and senokot daily started yesterday  Height:   Ht Readings from Last 1 Encounters:  09/20/15 5\' 9"  (1.753 m)    Weight:   Wt Readings from Last 1 Encounters:  09/25/15 556 lb 3.5 oz (252.3 kg)    Filed Weights   09/23/15 0500 09/24/15 0500 09/25/15 0452  Weight: 553 lb (250.839 kg) 552 lb 11.1 oz (250.7 kg) 556 lb 3.5 oz (252.3 kg)    BMI:  Body mass index is 82.1 kg/(m^2).  Estimated Nutritional Needs:   Kcal:  1600-1817kcals (22-25kcals/kg) using  IBW of 72.7kg  Protein:  145-181g protein (2.0-2.5g/kg) using IBW of 72.7kg  Fluid:  2181-2510145mL of fluid  (30-7135mL/kg)  EDUCATION NEEDS:   No education needs identified at this time  HIGH Care Level  Romelle Starcherate Anjalina Bergevin MS, RD, LDN [phone] Pager  [phone] Weekend/On-Call Pager

## 2015-09-25 NOTE — Progress Notes (Addendum)
Pharmacy Antibiotic Follow-up Note  Mitchell Morrow is a 40 y.o. year-old male admitted on 2015-07-04.  The patient is currently on day 3 of ceftazidine and vancomycin for sepsis.  Assessment/Plan: Will continue ceftazidime 2 g iv q 8 hours. Nephrology is concerned about vancomycin dosing of q 8 hours in setting of acute renal failure. Random vancomycin level was ordered, but am dose was already hung. Will d/c vancomycin order for now and check a trough with the next dose due at 1800. If trough is acceptable (< or = 20 mcg/ml), may consider reordering vancomycin with q 12 hour dosing.   1/7: Seems like Vancomycin trough level was ordered; however was canceled for some reason by laboratory. Will restart Vancomycin 1250 mg IV q8 hours since patient's renal function has improved significantly. Will order Vancomycin level to be drawn prior to the 3rd dose to check for any signs of accumulation. Trough level ordered for 2330 on 1/7.   Continue Fortaz 2 g IV q8 hours.    Temp (24hrs), Avg:98.7 F (37.1 C), Min:97.5 F (36.4 C), Max:99.6 F (37.6 C)   Recent Labs Lab 09/21/15 0432 09/22/15 0318 09/23/15 0447 09/24/15 0512 09/25/15 0510  WBC 7.8 8.8 10.2 10.6 10.5     Recent Labs Lab 09/21/15 0432 09/22/15 0318 09/23/15 0447 09/24/15 0512 09/25/15 0510  CREATININE 1.55* 1.93* 2.31* 1.61* 1.16   Estimated Creatinine Clearance: 169.9 mL/min (by C-G formula based on Cr of 1.16).    No Known Allergies  Antimicrobials this admission: Levofloxacin 12/31>>1/1, 1/4 Ceftazidine 1/5 >> Vancomycin 1/5 >>  Levels/dose changes this admission: Vancomycin trough scheduled for 1/6 at 0900  Microbiology results: 1/4 BCx: NGTD x 2 1/4 UCx: negative   1/4 Sputum: Yeast, Gram Negative Coccobacilli 1/5 Sputum: Yeast, Gram Positive Cocci  1/4 MRSA PCR: negative  Thank you for allowing pharmacy to be a part of this patient's care.  Demetrius CharityJames,Odilon Cass D PharmD 09/25/2015 7:49 AM

## 2015-09-25 NOTE — Progress Notes (Addendum)
[facility] [place] [facility]   PATIENT NAME: Siah Steely    MR#:  [id number]  DATE OF BIRTH:  02/07/1976  SUBJECTIVE:    REVIEW OF SYSTEMS:   Review of Systems  Unable to perform ROS: intubated    DRUG ALLERGIES:  No Known Allergies  VITALS:  Blood pressure 98/76, pulse 95, temperature 98.7 F (37.1 C), temperature source Axillary, resp. rate 19, height 5\' 9"  (1.753 m), weight 252.3 kg (556 lb 3.5 oz), SpO2 92 %.  PHYSICAL EXAMINATION:   Physical Exam  Constitutional: He is well-developed, well-nourished, and in no distress. No distress.  Morbidly obese  HENT:  Head: Normocephalic.  Eyes: No scleral icterus.  Neck: No JVD present. No tracheal deviation present.  Cardiovascular: Normal rate and regular rhythm.  Exam reveals no gallop and no friction rub.   No murmur heard. Distant heart sounds  Pulmonary/Chest: Effort normal and breath sounds normal. No respiratory distress. He has no wheezes. He has no rales. He exhibits no tenderness.  Abdominal: Soft. Bowel sounds are normal. He exhibits no distension and no mass. There is no tenderness. There is no rebound and no guarding.  Musculoskeletal: Normal range of motion. He exhibits edema.  Neurological: He is alert.  Sedated on vent  Skin: Skin is warm. No rash noted. No erythema.  Psychiatric: Affect and judgment normal.   LABORATORY PANEL:   CBC  Recent Labs Lab 09/25/15 0510  WBC 10.5  HGB 13.6  HCT 44.3  PLT 76*   ------------------------------------------------------------------------------------------------------------------  Chemistries   Recent Labs Lab 09/24/15 0512 09/25/15 0510  NA 139 141  K 4.1 3.9  CL 96* 98*  CO2 36* 38*  GLUCOSE 127* 130*  BUN 56* 50*  CREATININE 1.61* 1.16  CALCIUM 8.3* 8.2*  AST 19  --   ALT 10*  --   ALKPHOS 55  --   BILITOT 4.1*  --     ------------------------------------------------------------------------------------------------------------------  Cardiac Enzymes  Recent Labs Lab 09/23/15 0447 09/23/15 0848  TROPONINI <0.03 <0.03   ------------------------------------------------------------------------------------------------------------------  RADIOLOGY:  Dg Chest Port 1 View  09/24/2015  CLINICAL DATA:  Respiratory failure. EXAM: PORTABLE CHEST 1 VIEW COMPARISON:  Chest x-rays from 09/14/2015 to 09/23/2015 FINDINGS: Endotracheal tube, central line, and OG tube appear unchanged. Persistent increased density at both lung bases, most likely pleural effusions and atelectasis. Fluid along the fissures in the right hemi thorax. New linear area of atelectasis in the right lung apex. Cardiomegaly with enlargement of the main pulmonary artery. IMPRESSION: New linear atelectasis in the right lung apex. Persistent bibasilar atelectasis and effusions. Electronically Signed   By: Francene Boyers M.D.   On: 09/24/2015 07:53    ASSESSMENT AND PLAN:   40 year old male with a history of morbid obesity and tobacco abuse who presented with lower extremity edema and found to have bilateral pneumonia.  1. Acute hypercapnic respiratory failure-: This is multifactorial and thought to be due to obesity hypoventilation syndrome, community-acquired pneumonia and pulmonary edema versus ARDS.  Vent management as per pulmonary.  Continue IV antibiotics   2. Bilateral community-acquired pneumonia with septic shock On vancomycin and ceftazidime. Sputum culture showing rare gram-negative cocci bacilli. Blood cultures are negative to date. Patient remains on pressors.     3.New onset systolic congestive heart failure,  EF 45%  - has elevated BNP, edema and vascular congestion on chest x-ray  Stopped IV Lasix due to worsening renal function  Monitor input and output along with daily weight.  4. Acute renal failure - Due to ATN and  diuresis Appreciate nephrology help Creatinine has improved to 1.16 today.  5. Cutaneous candidiasis nystatin powder  6. Morbid obesity will need bariatric referral as outpatient Patient will also need sleep evaluation.  7. Thrombocytopenia - without acute bleeding  Etiology unclear. No old labs available.  No aspirin, heparin or Lovenox.  negative hepatitis panel, negative HIV. Could also be due to acute infection.   8.Hyperbilirubinemia could be passive congestion from congestive heart failure. hepatitis panel and ultrasound of right upper quadrant suggestive of cirrhotic changes. US showed Cirrhosis  9. Tobacco abuse counseled to quit smoking on admission  * DVT prophylaxis with SCDs.  CODE STATUS: full.  Critical care TOTAL TIME TAKING CARE OF THIS PATIENT: 30 critical care minutes.      Emon Lance M.D on 09/25/2015   Between 7am to 6pm - Pager - [phone]  After 6pm go to [email or web address] - password [email or web address] [facility]  [facility]  Office  [phone]  CC: Primary care physician; No PCP Per Patient  Note: This dictation was prepared with Dragon dictation along with smaller phrase technology. Any transcriptional errors that result from this process are unintentional.

## 2015-09-26 ENCOUNTER — Inpatient Hospital Stay: Payer: Medicaid Other

## 2015-09-26 LAB — BLOOD GAS, ARTERIAL
ACID-BASE EXCESS: 11.5 mmol/L — AB (ref 0.0–3.0)
BICARBONATE: 39.3 meq/L — AB (ref 21.0–28.0)
FIO2: 0.5
MECHANICAL RATE: 16
MECHVT: 500 mL
O2 Saturation: 97.5 %
PCO2 ART: 65 mmHg — AB (ref 32.0–48.0)
PEEP/CPAP: 8 cmH2O
Patient temperature: 37
pH, Arterial: 7.39 (ref 7.350–7.450)
pO2, Arterial: 97 mmHg (ref 83.0–108.0)

## 2015-09-26 LAB — GLUCOSE, CAPILLARY
Glucose-Capillary: 130 mg/dL — ABNORMAL HIGH (ref 65–99)
Glucose-Capillary: 137 mg/dL — ABNORMAL HIGH (ref 65–99)
Glucose-Capillary: 127 mg/dL — ABNORMAL HIGH (ref 65–99)

## 2015-09-26 LAB — BASIC METABOLIC PANEL
Anion gap: 6 (ref 5–15)
BUN: 54 mg/dL — AB (ref 6–20)
CALCIUM: 8.6 mg/dL — AB (ref 8.9–10.3)
CHLORIDE: 100 mmol/L — AB (ref 101–111)
CO2: 38 mmol/L — ABNORMAL HIGH (ref 22–32)
CREATININE: 1.14 mg/dL (ref 0.61–1.24)
GFR calc non Af Amer: >60 mL/min (ref >60)
Glucose, Bld: 129 mg/dL — ABNORMAL HIGH (ref 65–99)
Potassium: 4.1 mmol/L (ref 3.5–5.1)
SODIUM: 144 mmol/L (ref 135–145)

## 2015-09-26 LAB — CULTURE, RESPIRATORY W GRAM STAIN

## 2015-09-26 LAB — CBC
HCT: 42.7 % (ref 40.0–52.0)
HEMOGLOBIN: 13.1 g/dL (ref 13.0–18.0)
MCH: 25.6 pg — AB (ref 26.0–34.0)
MCHC: 30.7 g/dL — AB (ref 32.0–36.0)
MCV: 83.6 fL (ref 80.0–100.0)
PLATELETS: 82 10*3/uL — AB (ref 150–440)
RBC: 5.11 MIL/uL (ref 4.40–5.90)
RDW: 19.3 % — AB (ref 11.5–14.5)
WBC: 11.5 10*3/uL — ABNORMAL HIGH (ref 3.8–10.6)

## 2015-09-26 LAB — CULTURE, RESPIRATORY

## 2015-09-26 LAB — VANCOMYCIN, TROUGH: VANCOMYCIN TR: 25 ug/mL — AB (ref 10–20)

## 2015-09-26 MED ORDER — VANCOMYCIN HCL 10 G IV SOLR
1250.0000 mg | Freq: Two times a day (BID) | INTRAVENOUS | Status: DC
Start: 2015-09-26 — End: 2015-09-27
  Administered 2015-09-26 – 2015-09-27 (×3): 1250 mg via INTRAVENOUS
  Filled 2015-09-26 (×3): qty 1250

## 2015-09-26 MED ORDER — DILTIAZEM HCL 25 MG/5ML IV SOLN
10.0000 mg | Freq: Once | INTRAVENOUS | Status: AC
  Administered 2015-09-26: 10 mg via INTRAVENOUS
  Filled 2015-09-26: qty 5

## 2015-09-26 MED ORDER — DILTIAZEM HCL 25 MG/5ML IV SOLN
INTRAVENOUS | Status: AC
  Administered 2015-09-26: 10 mg via INTRAVENOUS
  Filled 2015-09-26: qty 5

## 2015-09-26 MED ORDER — DILTIAZEM HCL 25 MG/5ML IV SOLN
10.0000 mg | Freq: Once | INTRAVENOUS | Status: AC
  Administered 2015-09-26: 10 mg via INTRAVENOUS

## 2015-09-26 MED ORDER — METOPROLOL TARTRATE 1 MG/ML IV SOLN
5.0000 mg | Freq: Four times a day (QID) | INTRAVENOUS | Status: DC
  Administered 2015-09-26 – 2015-10-06 (×37): 5 mg via INTRAVENOUS
  Filled 2015-09-26 (×37): qty 5

## 2015-09-26 NOTE — Progress Notes (Signed)
Pharmacy Antibiotic Follow-up Note  Mitchell Morrow is a 40 y.o. year-old male admitted on 02/01/15.    Vancomycin trough resulted 25 mcg/mL. New Ke calculated as 0.051 hr-1. Dose modified to vancomycin 1.25 gm IV Q12H with predicted trough 16 mcg/mL. Pharmacy will continue to monitor and adjust as needed to maintain trough 15 to 20 mcg/mL.   Temp (24hrs), Avg:98.3 F (36.8 C), Min:97.5 F (36.4 C), Max:98.8 F (37.1 C)   Recent Labs Lab 09/21/15 0432 09/22/15 0318 09/23/15 0447 09/24/15 0512 09/25/15 0510  WBC 7.8 8.8 10.2 10.6 10.5     Recent Labs Lab 09/21/15 0432 09/22/15 0318 09/23/15 0447 09/24/15 0512 09/25/15 0510  CREATININE 1.55* 1.93* 2.31* 1.61* 1.16   Estimated Creatinine Clearance: 169.9 mL/min (by C-G formula based on Cr of 1.16).    No Known Allergies  Antimicrobials this admission: Levofloxacin 12/31>>1/1, 1/4 Ceftazidine 1/5 >> Vancomycin 1/5 >>  Levels/dose changes this admission: Vancomycin trough scheduled for 1/6 at 0900  Microbiology results: 1/4 BCx: No Growth < 24hours 1/4 UCx: No Growth < 24hours  1/4 Sputum: Yeast, Gram Negative Coccobacilli 1/5 Sputum: Yeast, Gram Positive Cocci  1/4 MRSA PCR: negative  Thank you for allowing pharmacy to be a part of this patient's care.  Carola FrostNathan A Victorian Gunn, Pharm.D., BCPS Clinical Pharmacist 09/26/2015 12:18 AM

## 2015-09-26 NOTE — Progress Notes (Signed)
PULMONARY/CCM PROGRESS NOTE  Active problems: Acute ventilator dependent hypercapneic respiratory failure Vasopressor dependent hypotension AKI - baseline Cr 1.06, continue to monitor.  Morbid obesity Smoker Cor pulmonale Hyperbilirubinemia - likely cholestasis Thrombocytopenia - appears chronic  Lines, Tubes, etc: ETT 01/02 >> . Patient self extubated, subsequently reintubated 09/25/2015 L IJ CVL 01/02 >>   Microbiology: Blood 12/30 >>  MRSA PCR 01/02 >> NEG Urine 01/04 >> NEG Resp 01/04 >> rare Gram neg coccobacilli >>  Resp 01/05 >> rare GPCs >>  Blood 01/04 >>    PCT 01/04: 0.74  Antibiotics:  Levofloxacin 12/30 >> 01/01 Ceftriaxone 01/02 >> 01/02 Azithro 01/02 >> 01/02 Levofloxacin 01/04 >> 01/05 Vanc 01/05 >>  Ceftaz 01/05 >>   PCT 01/04: 0.74, 0.95, 0.77  Studies/Events: 12/31 TTE:  poor quality study, LVEF 45% 12/31 RUQ: Nodular contour the liver raising the question of cirrhosis. Gallbladder wall thickening, nonspecific in appearance 01/04 night - fever, hypotension. Cultures obtained. Abx expanded  Consultants:  Renal 01/05: no acute indication for HD   Best Practice: DVT: enoxaparin SUP: enteral famotidine Nutrition: TFs Glycemic control: N/I Sedation/analgesia: fent/dexmedetomidine infusion. PRN lorazepam    Subj: Patient self extubated yesterday, subsequently, he was reintubated with significant difficulty.  Obj: Filed Vitals:   09/26/15 0700 09/26/15 0800 09/26/15 0900 09/26/15 1000  BP: 105/75 115/81 116/75 116/75  Pulse: 92 93 96 96  Temp:   99 F (37.2 C)   TempSrc:   Oral   Resp: 16 16 17 17   Height:      Weight:      SpO2: 96% 95% 95% 95%    Gen: morbidly obese HEENT:  WNL Neck: CVL site clean, JVP cannot be assessed Chest: clear today somewhat prolonged exp time Cardiac: distant HS, regular, no M noted Abd: obese, soft, + BS Ext: symmetric edema, chronic stasis changes  BMET    Component Value Date/Time   NA 144  09/26/2015 0513   K 4.1 09/26/2015 0513   CL 100* 09/26/2015 0513   CO2 38* 09/26/2015 0513   GLUCOSE 129* 09/26/2015 0513   BUN 54* 09/26/2015 0513   CREATININE 1.14 09/26/2015 0513   CALCIUM 8.6* 09/26/2015 0513   GFRNONAA >60 09/26/2015 0513   GFRAA >60 09/26/2015 0513    CBC    Component Value Date/Time   WBC 11.5* 09/26/2015 0513   RBC 5.11 09/26/2015 0513   HGB 13.1 09/26/2015 0513   HCT 42.7 09/26/2015 0513   PLT 82* 09/26/2015 0513   MCV 83.6 09/26/2015 0513   MCH 25.6* 09/26/2015 0513   MCHC 30.7* 09/26/2015 0513   RDW 19.3* 09/26/2015 0513   LYMPHSABS 2.1 09/24/2015 0512   MONOABS 1.5* 09/24/2015 0512   EOSABS 0.3 09/24/2015 0512   BASOSABS 0.0 09/24/2015 0512    CXR:  New linear atelectasis in the right lung apex. Persistent bibasilar atelectasis and effusions   IMPRESSION: 1) Acute on chronic hypercarbic respiratory failure - multifactorial 2) Obesity hypoventilation syndrome 3) Atelectasis 4) Pulm edema vs ALI/ARDS 5) Fever, hypotension, suspect severe sepsis 01/05 - likely due to PNA 6) Hypotension resolved 7) AKI, non-oliguric - Cr improving 01/06 8) Mild hyperglycemia without prior dx of DM 9) Elevated bilirubin - likely cholestasis. Rest of LFTs normal. RUQ US unrevealing 10) Thrombocytopenia, unknown chronicity - stable 11) ICU associated discomfort  PLAN/RECS:  Cont vent support - settings reviewed and/or adjusted Wean in PSV as tolerated, failed SBT today, will try again tomorrow.  Cont vent bundle Follow CXR intermittently Daily SBT  if/when meets criteria MAP goal > 65 mmHg Monitor BMET intermittently Monitor I/Os Correct electrolytes as indicated Cont TFs Bowel regimen initiated 01/06--appears at goal, will continue.  Monitor glucose. Consider SSI for glu > 180   CCM time: 35 mins The above time includes time spent reviewing care plan on multidisciplinary rounds  Deep Nicholos Johns, M.D.

## 2015-09-26 NOTE — Progress Notes (Signed)
[facility] [place] at [facility]   PATIENT NAME: Mitchell Morrow    MR#:  [id number]  DATE OF BIRTH:  [birth year]  SUBJECTIVE:  No acute events over nigth Patient remains on ventilator.  REVIEW OF SYSTEMS:   Review of Systems  Unable to perform ROS: intubated    DRUG ALLERGIES:  No Known Allergies  VITALS:  Blood pressure 116/75, pulse 96, temperature 99 F (37.2 C), temperature source Oral, resp. rate 17, height 5\' 9"  (1.753 m), weight 252.2 kg (556 lb), SpO2 95 %.  PHYSICAL EXAMINATION:   Physical Exam  Constitutional: He is well-developed, well-nourished, and in no distress. No distress.  Morbidly obese  HENT:  Head: Normocephalic.  Eyes: No scleral icterus.  Neck: No JVD present. No tracheal deviation present.  Cardiovascular: Normal rate and regular rhythm.  Exam reveals no gallop and no friction rub.   No murmur heard. Distant heart sounds  Pulmonary/Chest: Effort normal and breath sounds normal. No respiratory distress. He has no wheezes. He has no rales. He exhibits no tenderness.  Abdominal: Soft. Bowel sounds are normal. He exhibits no distension and no mass. There is no tenderness. There is no rebound and no guarding.  Musculoskeletal: Normal range of motion. He exhibits edema.  Neurological: He is alert.  Sedated on vent  Skin: Skin is warm. No rash noted. No erythema.  Psychiatric: Affect and judgment normal.   LABORATORY PANEL:   CBC  Recent Labs Lab 09/26/15 0513  WBC 11.5*  HGB 13.1  HCT 42.7  PLT 82*   ------------------------------------------------------------------------------------------------------------------  Chemistries   Recent Labs Lab 09/24/15 0512  09/26/15 0513  NA 139  < > 144  K 4.1  < > 4.1  CL 96*  < > 100*  CO2 36*  < > 38*  GLUCOSE 127*  < > 129*  BUN 56*  < > 54*  CREATININE 1.61*  < > 1.14  CALCIUM 8.3*  < > 8.6*  AST 19  --   --   ALT 10*  --   --   ALKPHOS 55  --   --   BILITOT  4.1*  --   --   < > = values in this interval not displayed. ------------------------------------------------------------------------------------------------------------------  Cardiac Enzymes  Recent Labs Lab 09/23/15 0447 09/23/15 0848  TROPONINI <0.03 <0.03   ------------------------------------------------------------------------------------------------------------------  RADIOLOGY:  Dg Chest 1 View  09/26/2015  CLINICAL DATA:  Dyspnea, morbid obesity. EXAM: CHEST 1 VIEW COMPARISON:  Chest x-rays dated 09/25/2015 and 09/24/2015. FINDINGS: Endotracheal tube appears well positioned with tip just above the level of the carina. Enteric tube passes below the diaphragm. Left IJ central line remains adequately positioned with tip projected over the upper SVC. Exam otherwise limited, as previously described. There is grossly stable cardiomegaly. Overall cardiomediastinal silhouette appears stable in size and configuration. Central pulmonary vascular congestion and bilateral interstitial edema persists. Left lung base difficult to evaluate, suspect atelectasis and/or effusion at the left lung base. IMPRESSION: 1. Stable chest x-ray appearance. Cardiomegaly with central pulmonary vascular congestion and bilateral interstitial edema, not significantly changed, suggesting continued mild volume overload/CHF. 2. Left lung base difficult to evaluate. Suspect atelectasis and/or small effusion at the left lung base. If febrile, left lower lobe pneumonia could not be excluded. 3.  Tubes and lines remain adequately positioned. Electronically Signed   By: Bary Richard M.D.   On: 09/26/2015 09:03   Dg Abd 1 View  09/25/2015  CLINICAL DATA:  Check gastric catheter placement  EXAM: ABDOMEN - 1 VIEW COMPARISON:  09/20/2015 FINDINGS: Gastric catheter extends into the mid stomach. No other diagnostic information can be gleaned from this examination. IMPRESSION: Gastric catheter in mid stomach Electronically Signed   By:  Alcide CleverMark  Lukens M.D.   On: 09/25/2015 18:55   Dg Chest Port 1 View  09/25/2015  CLINICAL DATA:  Difficulty with intubation. Morbid obesity. History of respiratory failure. EXAM: PORTABLE CHEST 1 VIEW COMPARISON:  Chest x-rays dated 09/24/2015 and 09/23/2015. FINDINGS: Endotracheal tube appears well positioned with tip approximately 2 cm above the carina. Left IJ central line remains adequately positioned with tip at the upper margin of the SVC. Enteric tube appears to pass below the diaphragm but its inferior portion is not clearly seen. Remainder of the exam is limited. There is grossly stable cardiomegaly. There is at least mild central pulmonary vascular congestion and bilateral interstitial edema, not significantly changed. IMPRESSION: Stable chest x-ray. Endotracheal tube appears well positioned with tip just above the level of the carina. Cardiomegaly with central pulmonary vascular congestion and bilateral interstitial edema, unchanged, suggesting continued mild volume overload/CHF. Electronically Signed   By: Bary RichardStan  Maynard M.D.   On: 09/25/2015 16:18    ASSESSMENT AND PLAN:   40 year old male with a history of morbid obesity and tobacco abuse who presented with lower extremity edema and found to have bilateral pneumonia.  1. Acute hypercapnic respiratory failure-: This is multifactorial and thought to be due to obesity hypoventilation syndrome, community-acquired pneumonia and pulmonary edema versus ARDS.  Vent management as per pulmonary.  Continue IV antibiotics   2. Bilateral community-acquired pneumonia with septic shock On vancomycin and ceftazidime. Sputum culture showing rare gram-negative cocci bacilli. Blood cultures are negative to date. Patient remains on pressors.     3.New onset systolic congestive heart failure,  EF 45%  - has elevated BNP, edema and vascular congestion on chest x-ray  Stopped IV Lasix due to worsening renal function  Monitor input and output along with  daily weight. As per nephrology consultation if further diuresis is required consider. Lasix drip as he is unable to handle large Lasix boluses at this time.   4. Acute renal failure - Due to ATN and diuresis Appreciate nephrology help Creatinine is at baseline.   5. Cutaneous candidiasis nystatin powder  6. Morbid obesity will need bariatric referral as outpatient Patient will also need sleep evaluation.  7. Thrombocytopenia - without acute bleeding  Etiology unclear. No old labs available.  No aspirin, heparin or Lovenox.  negative hepatitis panel, negative HIV. Could also be due to acute infection.  Platelet count improving.  8.Hyperbilirubinemia could be passive congestion from congestive heart failure. hepatitis panel and ultrasound of right upper quadrant suggestive of cirrhotic changes. US showed Cirrhosis  9. Tobacco abuse counseled to quit smoking on admission  * DVT prophylaxis with SCDs.   CODE STATUS: full.  Critical care TOTAL TIME TAKING CARE OF THIS PATIENT: 30 critical care minutes.      Calissa Swenor M.D on 09/26/2015   Between 7am to 6pm - Pager - [phone]  After 6pm go to [email or web address] - password [email or web address] [facility]  [facility]  Office  [phone]  CC: Primary care physician; No PCP Per Patient  Note: This dictation was prepared with Dragon dictation along with smaller phrase technology. Any transcriptional errors that result from this process are unintentional.

## 2015-09-26 NOTE — Progress Notes (Signed)
Dr. Juliene PinaMody notified and orders received. Patient was being given a bed bath, being turned and utilizing the lift system. Patient HR went up to 150 and stayed there. Medication given to help patient relax w/o results.

## 2015-09-26 NOTE — Progress Notes (Signed)
[facility]  ROUNDING NOTE   Subjective:   UOP nonoliguric Creatinine 1.14 Continues to be intubated  Norepinephrine 5 gtt  Objective:  Vital signs in last 24 hours:  Temp:  [98.5 F (36.9 C)-99 F (37.2 C)] 99 F (37.2 C) (01/08 0900) Pulse Rate:  [91-97] 96 (01/08 1000) Resp:  [12-21] 17 (01/08 1000) BP: (81-116)/(51-85) 116/75 mmHg (01/08 1000) SpO2:  [76 %-99 %] 95 % (01/08 1000) FiO2 (%):  [40 %-100 %] 50 % (01/08 0801) Weight:  [252.2 kg (556 lb)] 252.2 kg (556 lb) (01/08 0500)  Weight change: -0.1 kg (-3.5 oz) Filed Weights   09/24/15 0500 09/25/15 0452 09/26/15 0500  Weight: 250.7 kg (552 lb 11.1 oz) 252.3 kg (556 lb 3.5 oz) 252.2 kg (556 lb)    Intake/Output: I/O last 3 completed shifts: In: 5200.2 [I.V.:2490.2; NG/GT:1660; IV Piggyback:1050] Out: 2350 [Urine:2350]   Intake/Output this shift:     Physical Exam: General: Critically ill  Head: +ETT, +NGT  Eyes: Anicteric, PERRL  Neck: Left IJ TLC catheter  Lungs:  Decreased breath sounds, intubated, ventilated PRVC 50% PEEP 5  Heart: regular  Abdomen:  Soft, nontender, obese  Extremities: 2+ peripheral and dependent edema.  Neurologic: intubated  Skin: No lesions  GU: foley    Basic Metabolic Panel:  Recent Labs Lab 09/22/15 0318 09/23/15 0447 09/24/15 0512 09/25/15 0510 09/26/15 0513  NA 138 136 139 141 144  K 4.4 4.1 4.1 3.9 4.1  CL 94* 93* 96* 98* 100*  CO2 37* 35* 36* 38* 38*  GLUCOSE 135* 111* 127* 130* 129*  BUN 31* 52* 56* 50* 54*  CREATININE 1.93* 2.31* 1.61* 1.16 1.14  CALCIUM 8.7* 8.5* 8.3* 8.2* 8.6*  PHOS  --   --   --  2.7  --     Liver Function Tests:  Recent Labs Lab 09/22/15 0318 09/23/15 0447 09/24/15 0512 09/25/15 0510  AST 19 21 19   --   ALT 10* 10* 10*  --   ALKPHOS 71 70 55  --   BILITOT 3.5* 3.8* 4.1*  --   PROT 7.3 7.3 6.6  --   ALBUMIN 3.2* 3.3* 3.0* 2.7*   No results for input(s): LIPASE, AMYLASE in the last 168 hours. No results for  input(s): AMMONIA in the last 168 hours.  CBC:  Recent Labs Lab 09/22/15 0318 09/23/15 0447 09/24/15 0512 09/25/15 0510 09/26/15 0513  WBC 8.8 10.2 10.6 10.5 11.5*  NEUTROABS  --   --  6.7*  --   --   HGB 13.8 14.1 14.0 13.6 13.1  HCT 44.6 46.4 45.9 44.3 42.7  MCV 82.6 83.0 82.7 82.7 83.6  PLT 77* 83* 71* 76* 82*    Cardiac Enzymes:  Recent Labs Lab 09/22/15 2224 09/23/15 0447 09/23/15 0848  TROPONINI <0.03 <0.03 <0.03    BNP: Invalid input(s): POCBNP  CBG:  Recent Labs Lab 09/24/15 1111 09/24/15 1557 09/24/15 2336 09/25/15 2331 09/26/15 0726  GLUCAP 117* 133* 161* 89 130*    Microbiology: Results for orders placed or performed during the hospital encounter of 10/10/14  Blood culture (routine x 2)     Status: None   Collection Time: 10/10/14 11:53 PM  Result Value Ref Range Status   Specimen Description BLOOD LEFT HAND  Final   Special Requests BOTTLES DRAWN AEROBIC AND ANAEROBIC 4CC  Final   Culture NO GROWTH 5 DAYS  Final   Report Status 09/23/2015 FINAL  Final  Blood culture (routine x 2)  Status: None   Collection Time: 08/26/2015 11:53 PM  Result Value Ref Range Status   Specimen Description BLOOD LEFT ARM  Final   Special Requests BOTTLES DRAWN AEROBIC AND ANAEROBIC 5CC  Final   Culture NO GROWTH 5 DAYS  Final   Report Status 09/23/2015 FINAL  Final  Urine culture     Status: None   Collection Time: 09/18/15 12:21 AM  Result Value Ref Range Status   Specimen Description URINE, RANDOM  Final   Special Requests NONE  Final   Culture MULTIPLE SPECIES PRESENT, SUGGEST RECOLLECTION  Final   Report Status 09/19/2015 FINAL  Final  Rapid Influenza A&B Antigens ([facility] only)     Status: None   Collection Time: 09/18/15  6:26 AM  Result Value Ref Range Status   Influenza A ([facility]) NOT DETECTED  Final   Influenza B ([facility]) NOT DETECTED  Final  MRSA PCR Screening     Status: None   Collection Time: 09/20/15  6:30 AM  Result Value Ref Range Status    MRSA by PCR NEGATIVE NEGATIVE Final    Comment:        The GeneXpert MRSA Assay (FDA approved for NASAL specimens only), is one component of a comprehensive MRSA colonization surveillance program. It is not intended to diagnose MRSA infection nor to guide or monitor treatment for MRSA infections.   Culture, respiratory (NON-Expectorated)     Status: None (Preliminary result)   Collection Time: 09/22/15  3:13 PM  Result Value Ref Range Status   Specimen Description TRACHEAL ASPIRATE  Final   Special Requests NONE  Final   Gram Stain   Final    GOOD SPECIMEN - 80-90% WBCS MODERATE WBC SEEN RARE YEAST RARE GRAM NEGATIVE COCCOBACILLI    Culture LIGHT GROWTH YEAST IDENTIFICATION TO FOLLOW   Final   Report Status PENDING  Incomplete  Urine culture     Status: None   Collection Time: 09/22/15 11:16 PM  Result Value Ref Range Status   Specimen Description URINE, RANDOM  Final   Special Requests NONE  Final   Culture NO GROWTH 2 DAYS  Final   Report Status 09/24/2015 FINAL  Final  CULTURE, BLOOD (ROUTINE X 2) w Reflex to PCR ID Panel     Status: None (Preliminary result)   Collection Time: 09/22/15 11:44 PM  Result Value Ref Range Status   Specimen Description BLOOD RIGHT ANTECUBITAL  Final   Special Requests BOTTLES DRAWN AEROBIC AND ANAEROBIC  Final   Culture NO GROWTH 3 DAYS  Final   Report Status PENDING  Incomplete  CULTURE, BLOOD (ROUTINE X 2) w Reflex to PCR ID Panel     Status: None (Preliminary result)   Collection Time: 09/22/15 11:56 PM  Result Value Ref Range Status   Specimen Description BLOOD LEFT HAND  Final   Special Requests BOTTLES DRAWN AEROBIC AND ANAEROBIC  Final   Culture NO GROWTH 3 DAYS  Final   Report Status PENDING  Incomplete  Culture, respiratory (NON-Expectorated)     Status: None   Collection Time: 09/23/15 12:01 AM  Result Value Ref Range Status   Specimen Description TRACHEAL ASPIRATE  Final   Special Requests NONE  Final   Gram Stain    Final    GOOD SPECIMEN - 80-90% WBCS MODERATE WBC SEEN RARE YEAST RARE GRAM POSITIVE COCCI    Culture Consistent with normal respiratory flora.  Final   Report Status 09/25/2015 FINAL  Final    Coagulation  Studies: No results for input(s): LABPROT, INR in the last 72 hours.  Urinalysis: No results for input(s): COLORURINE, LABSPEC, PHURINE, GLUCOSEU, HGBUR, BILIRUBINUR, KETONESUR, PROTEINUR, UROBILINOGEN, NITRITE, LEUKOCYTESUR in the last 72 hours.  Invalid input(s): APPERANCEUR    Imaging: Dg Chest 1 View  09/26/2015  CLINICAL DATA:  Dyspnea, morbid obesity. EXAM: CHEST 1 VIEW COMPARISON:  Chest x-rays dated 09/25/2015 and 09/24/2015. FINDINGS: Endotracheal tube appears well positioned with tip just above the level of the carina. Enteric tube passes below the diaphragm. Left IJ central line remains adequately positioned with tip projected over the upper SVC. Exam otherwise limited, as previously described. There is grossly stable cardiomegaly. Overall cardiomediastinal silhouette appears stable in size and configuration. Central pulmonary vascular congestion and bilateral interstitial edema persists. Left lung base difficult to evaluate, suspect atelectasis and/or effusion at the left lung base. IMPRESSION: 1. Stable chest x-ray appearance. Cardiomegaly with central pulmonary vascular congestion and bilateral interstitial edema, not significantly changed, suggesting continued mild volume overload/CHF. 2. Left lung base difficult to evaluate. Suspect atelectasis and/or small effusion at the left lung base. If febrile, left lower lobe pneumonia could not be excluded. 3.  Tubes and lines remain adequately positioned. Electronically Signed   By: Bary Richard M.D.   On: 09/26/2015 09:03   Dg Abd 1 View  09/25/2015  CLINICAL DATA:  Check gastric catheter placement EXAM: ABDOMEN - 1 VIEW COMPARISON:  09/20/2015 FINDINGS: Gastric catheter extends into the mid stomach. No other diagnostic  information can be gleaned from this examination. IMPRESSION: Gastric catheter in mid stomach Electronically Signed   By: Alcide Clever M.D.   On: 09/25/2015 18:55   Dg Chest Port 1 View  09/25/2015  CLINICAL DATA:  Difficulty with intubation. Morbid obesity. History of respiratory failure. EXAM: PORTABLE CHEST 1 VIEW COMPARISON:  Chest x-rays dated 09/24/2015 and 09/23/2015. FINDINGS: Endotracheal tube appears well positioned with tip approximately 2 cm above the carina. Left IJ central line remains adequately positioned with tip at the upper margin of the SVC. Enteric tube appears to pass below the diaphragm but its inferior portion is not clearly seen. Remainder of the exam is limited. There is grossly stable cardiomegaly. There is at least mild central pulmonary vascular congestion and bilateral interstitial edema, not significantly changed. IMPRESSION: Stable chest x-ray. Endotracheal tube appears well positioned with tip just above the level of the carina. Cardiomegaly with central pulmonary vascular congestion and bilateral interstitial edema, unchanged, suggesting continued mild volume overload/CHF. Electronically Signed   By: Bary Richard M.D.   On: 09/25/2015 16:18     Medications:   . dexmedetomidine Stopped (09/25/15 1900)  . feeding supplement (VITAL HIGH PROTEIN) 1,000 mL (09/26/15 0500)  . norepinephrine (LEVOPHED) Adult infusion 5 mcg/min (09/26/15 0016)  . propofol (DIPRIVAN) infusion 30 mcg/kg/min (09/26/15 0749)   . antiseptic oral rinse  7 mL Mouth Rinse 10 times per day  . budesonide  0.25 mg Nebulization 4 times per day  . cefTAZidime (FORTAZ)  IV  2 g Intravenous 3 times per day  . chlorhexidine gluconate  15 mL Mouth Rinse BID  . docusate  100 mg Per Tube BID  . enoxaparin (LOVENOX) injection  40 mg Subcutaneous Q12H  . famotidine  20 mg Per Tube Daily  . feeding supplement (PRO-STAT SUGAR FREE 64)  30 mL Oral 6 times per day  . free water  200 mL Per Tube 3 times per day   . ipratropium-albuterol  3 mL Nebulization Q6H  . nystatin  Topical BID  . polyethylene glycol  17 g Per Tube Daily  . senna  2 tablet Per Tube Daily  . sodium chloride  3 mL Intravenous Q12H  . sodium chloride  3 mL Intravenous Q12H  . vancomycin  1,250 mg Intravenous Q12H   sodium chloride, acetaminophen **OR** acetaminophen, albuterol, calcium carbonate, fentaNYL (SUBLIMAZE) injection, LORazepam, ondansetron **OR** ondansetron (ZOFRAN) IV, polyethylene glycol, sodium chloride, sodium chloride  Assessment/ Plan:  Mr. Mitchell Morrow is a 40 y.o. black male with morbid obesity, tobacco abuse, without prescribed home medications, who was admitted to [facility] on 09/08/2015   1. Acute Renal Failure: nonoliguric urine output. Baseline creatinine of 1.12 on admission. Now back to baseline. Several episodes of hypotension, overdiuresis and currently with sepsis.  - Renally dose all medications No use in a renal ultrasound due to patient's body habitus and critical illness.  - continue to hold diuretics. Hold IV fluids, patient is overloaded with peripheral and dependent pitting edema.  - if further diuresis required, consider furosemide gtt. Patient is unable to handle large furosemide bolus at this time.   2. Sepsis: afebrile for last 24 hours. Secondary to pneumonia. Blood cultures have been negative. wbc stabilized.  - broad spectrum antibiotics: ceftaz and vanc - monitor vanc level.  - hemodynamically unstable requiring vasopressors: norepinephrine.  3. Acute respiratory failure with bilateral pneumonia and acute exacerbation of systolic congestive heart failure: not currently on diuretics. CHF is a new diagnosis on this admission  Failed weaning trials.    LOS: 9 Mitchell Morrow 1/8/201710:36 AM

## 2015-09-26 NOTE — Progress Notes (Signed)
Dr. Juliene PinaMody notified of patient HR maintained at 140's with no change. Orders received.

## 2015-09-27 ENCOUNTER — Inpatient Hospital Stay: Payer: Medicaid Other

## 2015-09-27 LAB — GLUCOSE, CAPILLARY
Glucose-Capillary: 114 mg/dL — ABNORMAL HIGH (ref 65–99)
Glucose-Capillary: 116 mg/dL — ABNORMAL HIGH (ref 65–99)

## 2015-09-27 LAB — BASIC METABOLIC PANEL
ANION GAP: 5 (ref 5–15)
BUN: 52 mg/dL — AB (ref 6–20)
CHLORIDE: 100 mmol/L — AB (ref 101–111)
CO2: 39 mmol/L — ABNORMAL HIGH (ref 22–32)
Calcium: 8.6 mg/dL — ABNORMAL LOW (ref 8.9–10.3)
Creatinine, Ser: 0.93 mg/dL (ref 0.61–1.24)
Glucose, Bld: 124 mg/dL — ABNORMAL HIGH (ref 65–99)
POTASSIUM: 4 mmol/L (ref 3.5–5.1)
SODIUM: 144 mmol/L (ref 135–145)

## 2015-09-27 LAB — CBC
HCT: 40.1 % (ref 40.0–52.0)
HEMOGLOBIN: 12.4 g/dL — AB (ref 13.0–18.0)
MCH: 25.7 pg — ABNORMAL LOW (ref 26.0–34.0)
MCHC: 31.1 g/dL — ABNORMAL LOW (ref 32.0–36.0)
MCV: 82.8 fL (ref 80.0–100.0)
PLATELETS: 98 10*3/uL — AB (ref 150–440)
RBC: 4.84 MIL/uL (ref 4.40–5.90)
RDW: 19.5 % — ABNORMAL HIGH (ref 11.5–14.5)
WBC: 9.6 10*3/uL (ref 3.8–10.6)

## 2015-09-27 LAB — BLOOD GAS, ARTERIAL
ACID-BASE EXCESS: 13.5 mmol/L — AB (ref 0.0–3.0)
BICARBONATE: 41.2 meq/L — AB (ref 21.0–28.0)
FIO2: 0.5
MECHANICAL RATE: 16
O2 Saturation: 95.5 %
PATIENT TEMPERATURE: 37
PEEP/CPAP: 8 cmH2O
PH ART: 7.41 (ref 7.350–7.450)
PO2 ART: 78 mmHg — AB (ref 83.0–108.0)
VT: 500 mL
pCO2 arterial: 65 mmHg — ABNORMAL HIGH (ref 32.0–48.0)

## 2015-09-27 MED ORDER — VITAL HIGH PROTEIN PO LIQD
1000.0000 mL | ORAL | Status: DC
  Administered 2015-09-28 – 2015-09-29 (×2): 1000 mL

## 2015-09-27 MED ORDER — FAMOTIDINE 20 MG PO TABS
20.0000 mg | ORAL_TABLET | Freq: Two times a day (BID) | ORAL | Status: DC
  Administered 2015-09-27 – 2015-10-07 (×18): 20 mg
  Filled 2015-09-27 (×18): qty 1

## 2015-09-27 MED ORDER — BISACODYL 10 MG RE SUPP
10.0000 mg | Freq: Once | RECTAL | Status: AC
  Administered 2015-09-27: 10 mg via RECTAL
  Filled 2015-09-27: qty 1

## 2015-09-27 MED ORDER — SODIUM CHLORIDE 0.9 % IV SOLN
500.0000 mg | Freq: Three times a day (TID) | INTRAVENOUS | Status: DC
  Administered 2015-09-27 – 2015-09-29 (×7): 500 mg via INTRAVENOUS
  Filled 2015-09-27 (×9): qty 10

## 2015-09-27 MED FILL — Medication: Qty: 1 | Status: AC

## 2015-09-27 NOTE — Progress Notes (Signed)
Pt remains intubated FiO2 60% O2 sats upper 90's pt follows simple commands; vss; sinus tach on cardiac monitor; adequate uop via foley; per Dr. Irving ShowsJuengal tentative plans for tracheostomy placement next Monday October 04, 2015; xray of right foot and ankle ordered per Dr. Clovis FredricksonKasa's orders to rule out fracture and dislocation ordered; will continue to monitor and assess pt

## 2015-09-27 NOTE — Progress Notes (Signed)
[facility] [place] at [facility]   PATIENT NAME: Mitchell Morrow    MR#:  [id number]  DATE OF BIRTH:  06-27-1976  SUBJECTIVE:   Patient remains vent dependent. REVIEW OF SYSTEMS:   Review of Systems  Unable to perform ROS: intubated    DRUG ALLERGIES:  No Known Allergies  VITALS:  Blood pressure 104/71, pulse 110, temperature 98.4 F (36.9 C), temperature source Oral, resp. rate 17, height 5\' 9"  (1.753 m), weight 254.1 kg (560 lb 3 oz), SpO2 88 %.  PHYSICAL EXAMINATION:   Physical Exam  Constitutional: He is well-developed, well-nourished, and in no distress. No distress.  Morbidly obese  HENT:  Head: Normocephalic.  Eyes: No scleral icterus.  Neck: No JVD present. No tracheal deviation present.  Cardiovascular: Normal rate and regular rhythm.  Exam reveals no gallop and no friction rub.   No murmur heard. Distant heart sounds  Pulmonary/Chest: Effort normal and breath sounds normal. No respiratory distress. He has no wheezes. He has no rales. He exhibits no tenderness.  Abdominal: Soft. Bowel sounds are normal. He exhibits no distension and no mass. There is no tenderness. There is no rebound and no guarding.  Musculoskeletal: Normal range of motion. He exhibits edema.  Neurological: He is alert.  Sedated on vent  Skin: Skin is warm. No rash noted. No erythema.  Psychiatric: Affect and judgment normal.   LABORATORY PANEL:   CBC  Recent Labs Lab 09/27/15 0338  WBC 9.6  HGB 12.4*  HCT 40.1  PLT 98*   ------------------------------------------------------------------------------------------------------------------  Chemistries   Recent Labs Lab 09/24/15 0512  09/27/15 0338  NA 139  < > 144  K 4.1  < > 4.0  CL 96*  < > 100*  CO2 36*  < > 39*  GLUCOSE 127*  < > 124*  BUN 56*  < > 52*  CREATININE 1.61*  < > 0.93  CALCIUM 8.3*  < > 8.6*  AST 19  --   --   ALT 10*  --   --   ALKPHOS 55  --   --   BILITOT 4.1*  --   --   < >  = values in this interval not displayed. ------------------------------------------------------------------------------------------------------------------  Cardiac Enzymes  Recent Labs Lab 09/23/15 0447 09/23/15 0848  TROPONINI <0.03 <0.03   ------------------------------------------------------------------------------------------------------------------  RADIOLOGY:  Dg Chest 1 View  09/27/2015  CLINICAL DATA:  Shortness of breath. EXAM: CHEST 1 VIEW COMPARISON:  09/26/2015. FINDINGS: Endotracheal tube, left IJ line, NG tube in stable position . The cardiomegaly with pulmonary vascular prominence and bilateral pulmonary infiltrates consistent with pulmonary edema, no significant interim change. Right mid lung and basilar atelectasis. No pleural effusion or pneumothorax. IMPRESSION: 1. Lines and tubes in stable position. 2. Cardiomegaly with pulmonary vascular prominence and persistent bilateral pulmonary infiltrates consistent pulmonary edema. No significant interim change. 3. Lung volumes with persistent basilar atelectasis . Electronically Signed   By: Maisie Fus  Register   On: 09/27/2015 08:02   Dg Chest 1 View  09/26/2015  CLINICAL DATA:  Dyspnea, morbid obesity. EXAM: CHEST 1 VIEW COMPARISON:  Chest x-rays dated 09/25/2015 and 09/24/2015. FINDINGS: Endotracheal tube appears well positioned with tip just above the level of the carina. Enteric tube passes below the diaphragm. Left IJ central line remains adequately positioned with tip projected over the upper SVC. Exam otherwise limited, as previously described. There is grossly stable cardiomegaly. Overall cardiomediastinal silhouette appears stable in size and configuration. Central pulmonary vascular congestion and bilateral  interstitial edema persists. Left lung base difficult to evaluate, suspect atelectasis and/or effusion at the left lung base. IMPRESSION: 1. Stable chest x-ray appearance. Cardiomegaly with central pulmonary vascular  congestion and bilateral interstitial edema, not significantly changed, suggesting continued mild volume overload/CHF. 2. Left lung base difficult to evaluate. Suspect atelectasis and/or small effusion at the left lung base. If febrile, left lower lobe pneumonia could not be excluded. 3.  Tubes and lines remain adequately positioned. Electronically Signed   By: Bary Richard M.D.   On: 09/26/2015 09:03   Dg Abd 1 View  09/25/2015  CLINICAL DATA:  Check gastric catheter placement EXAM: ABDOMEN - 1 VIEW COMPARISON:  09/20/2015 FINDINGS: Gastric catheter extends into the mid stomach. No other diagnostic information can be gleaned from this examination. IMPRESSION: Gastric catheter in mid stomach Electronically Signed   By: Alcide Clever M.D.   On: 09/25/2015 18:55   Dg Chest Port 1 View  09/25/2015  CLINICAL DATA:  Difficulty with intubation. Morbid obesity. History of respiratory failure. EXAM: PORTABLE CHEST 1 VIEW COMPARISON:  Chest x-rays dated 09/24/2015 and 09/23/2015. FINDINGS: Endotracheal tube appears well positioned with tip approximately 2 cm above the carina. Left IJ central line remains adequately positioned with tip at the upper margin of the SVC. Enteric tube appears to pass below the diaphragm but its inferior portion is not clearly seen. Remainder of the exam is limited. There is grossly stable cardiomegaly. There is at least mild central pulmonary vascular congestion and bilateral interstitial edema, not significantly changed. IMPRESSION: Stable chest x-ray. Endotracheal tube appears well positioned with tip just above the level of the carina. Cardiomegaly with central pulmonary vascular congestion and bilateral interstitial edema, unchanged, suggesting continued mild volume overload/CHF. Electronically Signed   By: Bary Richard M.D.   On: 09/25/2015 16:18    ASSESSMENT AND PLAN:   40 year old male with a history of morbid obesity and tobacco abuse who presented with lower extremity edema and  found to have bilateral pneumonia.  1. Acute hypercapnic respiratory failure-: This is multifactorial and thought to be due to obesity hypoventilation syndrome, community-acquired pneumonia and pulmonary edema versus ARDS.  Vent management as per pulmonary.  Continue IV antibiotics  Patient will need ENT for tracheostomy.  2. Bilateral community-acquired pneumonia with septic shock On vancomycin and ceftazidime. Sputum culture still showing rare gram-negative cocci bacilli. Blood cultures are negative to date. Patient remains on pressors.     3.New onset systolic congestive heart failure,  EF 45%  - has elevated BNP, edema and vascular congestion on chest x-ray  Stopped IV Lasix due to worsening renal function  Monitor input and output along with daily weight. As per nephrology consultation if further diuresis is required consider. Lasix drip as he is unable to handle large Lasix boluses at this time.   4. Acute renal failure - Due to ATN and diuresis Appreciate nephrology help Creatinine has improved.   5. Cutaneous candidiasis nystatin powder  6. Morbid obesity will need bariatric referral as outpatient Patient will also need sleep evaluation.  7. Thrombocytopenia - without acute bleeding  Etiology unclear. No old labs available.  No aspirin, heparin or Lovenox.  negative hepatitis panel, negative HIV. Could also be due to acute infection.  Platelet count improving.  8.Hyperbilirubinemia could be passive congestion from congestive heart failure. hepatitis panel and ultrasound of right upper quadrant suggestive of cirrhotic changes. US showed Cirrhosis  9. Tobacco abuse counseled to quit smoking on admission  * DVT prophylaxis with SCDs.  CODE STATUS: full.  Critical care TOTAL TIME TAKING CARE OF THIS PATIENT: 30 critical care minutes.   Patient would benefit from LTAC placement. Stana Bayon M.D on 09/27/2015   Between 7am to 6pm - Pager -  [phone]  After 6pm go to [email or web address] - password [email or web address] [email or web address]  [facility]  Office  [phone]  CC: Primary care physician; No PCP Per Patient  Note: This dictation was prepared with Dragon dictation along with smaller phrase technology. Any transcriptional errors that result from this process are unintentional.

## 2015-09-27 NOTE — Consult Note (Signed)
Tonna CornerMiller, Shepherd [id number] 05/19/1976 Adrian SaranSital Mody, MD  Reason for Consult: Independent trach with failure at extubation, prolonged ventilation  HPI: 40 year old male who has had a history of morbid obesity and tobacco abuse and obesity hypoventilation syndrome. He presented with bilateral pneumonia and acute shortness of breath. He failed BiPAP and was intubated 09/20/2015. He extubated himself on 09/25/2015 but had to be emergently re-intubated. A consult was now placed for tracheostomy because of prolonged ventilation and inability to wean and extubated.  Allergies: No Known Allergies  ROS: Review of systems normal other than 12 systems except per HPI.  PMH:  Past Medical History  Diagnosis Date  . Morbid obesity (HCC)   . Tobacco abuse     FH:  Family History  Problem Relation Age of Onset  . Diabetes Other     SH:  Social History   Social History  . Marital Status: Single    Spouse Name: N/A  . Number of Children: N/A  . Years of Education: N/A   Occupational History  . Not on file.   Social History Main Topics  . Smoking status: Current Every Day Smoker  . Smokeless tobacco: Not on file  . Alcohol Use: No  . Drug Use: Not on file  . Sexual Activity: Not on file   Other Topics Concern  . Not on file   Social History Narrative    PSH: History reviewed. No pertinent past surgical history.  Physical  Exam: Morbidly obese African-American male. He has his eyes open but is not responding because of sedation Skin warm and dry. Nasal cavity without polyps or purulence. External nose and ears without masses or lesions. Neck supple with no masses or lesions, but his neck is extremely short as he is so obese.Marland Kitchen. No lymphadenopathy palpated. Thyroid nonpalpable..   A/P: Prolonged intubation with failure to wean and extubate. He has obesity hypoventilation syndrome and will need a trach tube long-term while he loses weight. We will arrange this in the operating room. I know it  can be scheduled for Monday of next week, but if one of my partners can do it sooner we will try to arrange that. Otherwise we will add it to my schedule on Monday morning 09/22/2015. Family is not available to discuss with now but will be contacted to make sure they understand and wish to proceed. He will need a tracheostomy tube to protect his airway and be able to proceed with long-term weaning from the vent. Will likely need a long-term acute care facility once his airway and medical issues are stabilized. Because of his morbid obesity and his medical issues there is higher risk of doing any surgery.   Cyntia Staley H 09/27/2015 5:20 PM

## 2015-09-27 NOTE — Progress Notes (Signed)
Nutrition Follow-up     INTERVENTION:   EN: with current diprivan, recommend continuing TF at rate of 20 ml/hr, continue Prostat 6 packets per day. Continue to assess Coordination of Care: discussed lack of BM since admission during ICU rounds, bowel regimen adjusted, plan for suppository today. Erythromycin also started for delayed gastric emptying   NUTRITION DIAGNOSIS:   Inadequate oral intake related to inability to eat as evidenced by NPO status.  GOAL:   Patient will meet greater than or equal to 90% of their needs  MONITOR:    (Energy Intake, Digestive System, Pulmonary Profile, Electrolyte and Renal Profile, Anthropometrics)  REASON FOR ASSESSMENT:   Ventilator, Consult Enteral/tube feeding initiation and management  ASSESSMENT:   Pt admitted with acute hypoxic and hypercapnic resp failure with acute metabolic encephalopathy from cocaine abuse leading to CHF and encephalopathy per MD note. Pt intubated this am.   Pt remains on vent, noted ENT consult for trach placement  EN: tolerating Vital High Protein at rate of 20 ml/hr  Digestive System: no signs of TF intolerance  Skin:  Reviewed, no issues  Last BM:  09/16/2015   Electrolyte and Renal Profile:  Recent Labs Lab 09/25/15 0510 09/26/15 0513 09/27/15 0338  BUN 50* 54* 52*  CREATININE 1.16 1.14 0.93  NA 141 144 144  K 3.9 4.1 4.0  PHOS 2.7  --   --    Glucose Profile:  Recent Labs  09/26/15 1458 09/26/15 2321 09/27/15 0754  GLUCAP 137* 127* 114*   Meds: diprivan (1199 kcals in 24 hours at rate of 45.4 ml/hr)  Height:   Ht Readings from Last 1 Encounters:  09/20/15 5\' 9"  (1.753 m)    Weight:   Wt Readings from Last 1 Encounters:  09/27/15 560 lb 3 oz (254.1 kg)    BMI:  Body mass index is 82.69 kg/(m^2).  Estimated Nutritional Needs:   Kcal:  1600-1817kcals (22-25kcals/kg) using  IBW of 72.7kg  Protein:  145-181g protein (2.0-2.5g/kg) using IBW of 72.7kg  Fluid:  2181-25445mL  of fluid (30-7735mL/kg)  EDUCATION NEEDS:   No education needs identified at this time  HIGH Care Level  Romelle Starcherate Ariannie Penaloza MS, RD, LDN [phone] Pager  [phone] Weekend/On-Call Pager

## 2015-09-27 NOTE — Care Management (Signed)
ENT consult has been placed for trach.  Intensivist indicates peg consideration is not appropriate at present time.

## 2015-09-27 NOTE — Progress Notes (Signed)
PULMONARY/CCM PROGRESS NOTE  Active problems: Acute ventilator dependent hypercapneic respiratory failure-failed extubation x 1 with morbid obesity Vasopressor dependent hypotension AKI - baseline Cr 1.06, continue to monitor.  Morbid obesity Smoker Cor pulmonale Hyperbilirubinemia - likely cholestasis Thrombocytopenia - appears chronic  Lines, Tubes, etc: ETT 01/02 >> . Patient self extubated, subsequently reintubated 09/25/2015 L IJ CVL 01/02 >>   Microbiology: Blood 12/30 >>  MRSA PCR 01/02 >> NEG Urine 01/04 >> NEG Resp 01/04 >> rare Gram neg coccobacilli >>  Resp 01/05 >> rare GPCs >>  Blood 01/04 >>    PCT 01/04: 0.74  Antibiotics:  Levofloxacin 12/30 >> 01/01 Ceftriaxone 01/02 >> 01/02 Azithro 01/02 >> 01/02 Levofloxacin 01/04 >> 01/05 Vanc 01/05 >>  Ceftaz 01/05 >>   PCT 01/04: 0.74, 0.95, 0.77  Studies/Events: 12/31 TTE:  poor quality study, LVEF 45% 12/31 RUQ: Nodular contour the liver raising the question of cirrhosis. Gallbladder wall thickening, nonspecific in appearance 01/04 night - fever, hypotension. Cultures obtained. Abx expanded  Consultants:  Renal 01/05: no acute indication for HD   Best Practice: DVT: enoxaparin SUP: enteral famotidine Nutrition: TFs Glycemic control: N/I Sedation/analgesia: fent/dexmedetomidine infusion. PRN lorazepam    Subj: Patient remains intubated,sedated Will need trach for survival Will place ENT consult  Obj: Filed Vitals:   09/27/15 0630 09/27/15 0700 09/27/15 0729 09/27/15 0800  BP: 103/63 97/63  96/68  Pulse: 111 104 105 105  Temp:  98.4 F (36.9 C)    TempSrc:  Oral    Resp: 17 16 23 17   Height:      Weight:      SpO2: 91% 93% 88% 91%    Gen: morbidly obese HEENT:  WNL Neck: CVL site clean, JVP cannot be assessed Chest: clear today somewhat prolonged exp time Cardiac: distant HS, regular, no M noted Abd: obese, soft, + BS Ext: symmetric edema, chronic stasis changes  BMET     Component Value Date/Time   NA 144 09/27/2015 0338   K 4.0 09/27/2015 0338   CL 100* 09/27/2015 0338   CO2 39* 09/27/2015 0338   GLUCOSE 124* 09/27/2015 0338   BUN 52* 09/27/2015 0338   CREATININE 0.93 09/27/2015 0338   CALCIUM 8.6* 09/27/2015 0338   GFRNONAA >60 09/27/2015 0338   GFRAA >60 09/27/2015 0338    CBC    Component Value Date/Time   WBC 9.6 09/27/2015 0338   RBC 4.84 09/27/2015 0338   HGB 12.4* 09/27/2015 0338   HCT 40.1 09/27/2015 0338   PLT 98* 09/27/2015 0338   MCV 82.8 09/27/2015 0338   MCH 25.7* 09/27/2015 0338   MCHC 31.1* 09/27/2015 0338   RDW 19.5* 09/27/2015 0338   LYMPHSABS 2.1 09/24/2015 0512   MONOABS 1.5* 09/24/2015 0512   EOSABS 0.3 09/24/2015 0512   BASOSABS 0.0 09/24/2015 0512    CXR:  New linear atelectasis in the right lung apex. Persistent bibasilar atelectasis and effusions   IMPRESSION: 1) Acute on chronic hypercarbic respiratory failure - multifactorial 2) Obesity hypoventilation syndrome 3) Atelectasis 4) Pulm edema vs ALI/ARDS 5) Fever, hypotension, suspect severe sepsis 01/05 - likely due to PNA 6) Hypotension resolved 7) AKI, non-oliguric - Cr improving 01/06 8) Mild hyperglycemia without prior dx of DM 9) Elevated bilirubin - likely cholestasis. Rest of LFTs normal. RUQ US unrevealing 10) Thrombocytopenia, unknown chronicity - stable 11) ICU associated discomfort  PLAN/RECS:  Cont vent support - settings reviewed and/or adjusted Plan for Trach-ENT consult to be placed Cont vent bundle Follow CXR intermittently Daily  SBT if/when meets criteria MAP goal > 65 mmHg Monitor BMET intermittently Monitor I/Os Correct electrolytes as indicated Cont TFs Bowel regimen initiated 01/06--appears at goal, will continue.  Monitor glucose. Consider SSI for glu > 180  I have personally obtained a history, examined the patient, evaluated Pertinent laboratory and [employer]/imaging results, and  formulated the assessment and  plan   The Patient requires high complexity decision making for assessment and support, frequent evaluation and titration of therapies, application of advanced monitoring technologies and extensive interpretation of multiple databases. Critical Care Time devoted to patient care services described in this note is 35 minutes.   Overall, patient is critically ill, prognosis is guarded.   Lucie Leather, M.D.  [facility]  Medical Director [facility] Medical Director [facility] Cardio-Pulmonary Department

## 2015-09-28 LAB — CBC
HCT: 40.3 % (ref 40.0–52.0)
Hemoglobin: 12.1 g/dL — ABNORMAL LOW (ref 13.0–18.0)
MCH: 24.6 pg — AB (ref 26.0–34.0)
MCHC: 29.9 g/dL — AB (ref 32.0–36.0)
MCV: 82.2 fL (ref 80.0–100.0)
PLATELETS: 108 10*3/uL — AB (ref 150–440)
RBC: 4.9 MIL/uL (ref 4.40–5.90)
RDW: 20.2 % — AB (ref 11.5–14.5)
WBC: 9.8 10*3/uL (ref 3.8–10.6)

## 2015-09-28 LAB — BLOOD GAS, ARTERIAL
ACID-BASE EXCESS: 12 mmol/L — AB (ref 0.0–3.0)
ALLENS TEST (PASS/FAIL): POSITIVE — AB
Bicarbonate: 40 mEq/L — ABNORMAL HIGH (ref 21.0–28.0)
FIO2: 0.4
LHR: 16 {breaths}/min
MECHVT: 500 mL
O2 SAT: 73 %
PATIENT TEMPERATURE: 37
PCO2 ART: 66 mmHg — AB (ref 32.0–48.0)
PEEP/CPAP: 8 cmH2O
PH ART: 7.39 (ref 7.350–7.450)
PO2 ART: 39 mmHg — AB (ref 83.0–108.0)

## 2015-09-28 LAB — GLUCOSE, CAPILLARY
GLUCOSE-CAPILLARY: 144 mg/dL — AB (ref 65–99)
Glucose-Capillary: 121 mg/dL — ABNORMAL HIGH (ref 65–99)
Glucose-Capillary: 144 mg/dL — ABNORMAL HIGH (ref 65–99)

## 2015-09-28 LAB — CULTURE, BLOOD (ROUTINE X 2)
CULTURE: NO GROWTH
CULTURE: NO GROWTH

## 2015-09-28 LAB — BASIC METABOLIC PANEL
Anion gap: 5 (ref 5–15)
BUN: 49 mg/dL — AB (ref 6–20)
CALCIUM: 8.6 mg/dL — AB (ref 8.9–10.3)
CO2: 37 mmol/L — ABNORMAL HIGH (ref 22–32)
CREATININE: 0.91 mg/dL (ref 0.61–1.24)
Chloride: 100 mmol/L — ABNORMAL LOW (ref 101–111)
GFR calc Af Amer: >60 mL/min (ref >60)
GLUCOSE: 124 mg/dL — AB (ref 65–99)
Potassium: 4.1 mmol/L (ref 3.5–5.1)
Sodium: 142 mmol/L (ref 135–145)

## 2015-09-28 MED ORDER — SENNOSIDES-DOCUSATE SODIUM 8.6-50 MG PO TABS
3.0000 | ORAL_TABLET | Freq: Two times a day (BID) | ORAL | Status: DC
  Administered 2015-09-28 – 2015-09-29 (×2): 3 via ORAL
  Filled 2015-09-28 (×2): qty 3

## 2015-09-28 MED ORDER — SODIUM CHLORIDE 0.9 % IV BOLUS (SEPSIS)
1000.0000 mL | Freq: Once | INTRAVENOUS | Status: AC
  Administered 2015-09-28: 1000 mL via INTRAVENOUS

## 2015-09-28 MED ORDER — BISACODYL 10 MG RE SUPP
10.0000 mg | Freq: Once | RECTAL | Status: AC
  Administered 2015-09-28: 10 mg via RECTAL

## 2015-09-28 MED ORDER — FENTANYL 2500MCG IN NS 250ML (10MCG/ML) PREMIX INFUSION
10.0000 ug/h | INTRAVENOUS | Status: DC
  Administered 2015-09-28: 10 ug/h via INTRAVENOUS
  Administered 2015-09-29: 25 ug/h via INTRAVENOUS
  Administered 2015-09-30: 10 ug/h via INTRAVENOUS
  Administered 2015-09-30: 100 ug/h via INTRAVENOUS
  Administered 2015-09-30: 150 ug/h via INTRAVENOUS
  Administered 2015-09-30: 100 ug/h via INTRAVENOUS
  Administered 2015-10-01: 10 ug/h via INTRAVENOUS
  Administered 2015-10-02 – 2015-10-03 (×2): 200 ug/h via INTRAVENOUS
  Administered 2015-10-03: 10 ug/h via INTRAVENOUS
  Administered 2015-10-04 – 2015-10-05 (×3): 200 ug/h via INTRAVENOUS
  Administered 2015-10-05: 150 ug/h via INTRAVENOUS
  Administered 2015-10-06: 100 ug/h via INTRAVENOUS
  Administered 2015-10-07: 125 ug/h via INTRAVENOUS
  Administered 2015-10-07: 150 ug/h via INTRAVENOUS
  Administered 2015-10-08 – 2015-10-09 (×2): 125 ug/h via INTRAVENOUS
  Administered 2015-10-09: 10 ug/h via INTRAVENOUS
  Administered 2015-10-10 (×2): 125 ug/h via INTRAVENOUS
  Administered 2015-10-11: 200 ug/h via INTRAVENOUS
  Filled 2015-09-28 (×17): qty 250

## 2015-09-28 MED ORDER — BISACODYL 10 MG RE SUPP
10.0000 mg | Freq: Every day | RECTAL | Status: DC | PRN
  Filled 2015-09-28: qty 1

## 2015-09-28 NOTE — Progress Notes (Signed)
RT in room to round ventilator, O2 sat high 90's on 60% FIO2, RT decreased to 50% FIO2.  Patient tolerating well at this time.  Will continue to monitor.

## 2015-09-28 NOTE — Progress Notes (Signed)
Patient continues to have heart rate 130's, added fentanyl. Metoprolol given Dr [facility]. No new orders given. Dr Belia HemanKasa spoke with mother regarding Trach. Tolerating tube feeds, adequate urine output concentrated amber. Small amount of blood tinged secretions.

## 2015-09-28 NOTE — Progress Notes (Signed)
Spoke with Dr Belia HemanKasa regarding patients heart rate. At this point no new orders given and continue to monitor. Per Dr Belia HemanKasa no wake up assessment this am.

## 2015-09-28 NOTE — Progress Notes (Signed)
[facility]  ROUNDING NOTE   Subjective:   UOP good Creatinine  Improved to 0.91 Remains intubated/sedated   Objective:  Vital signs in last 24 hours:  Temp:  [98.6 F (37 C)-100 F (37.8 C)] 100 F (37.8 C) (01/10 0400) Pulse Rate:  [103-114] 107 (01/10 0000) Resp:  [16-26] 20 (01/10 1000) BP: (93-117)/(59-76) 97/64 mmHg (01/10 1000) SpO2:  [93 %-100 %] 100 % (01/10 0804) FiO2 (%):  [50 %-60 %] 50 % (01/10 1150)  Weight change:  Filed Weights   09/25/15 0452 09/26/15 0500 09/27/15 0500  Weight: 252.3 kg (556 lb 3.5 oz) 252.2 kg (556 lb) 254.1 kg (560 lb 3 oz)    Intake/Output: I/O last 3 completed shifts: In: 4974.8 [I.V.:2402.7; NG/GT:1972.2; IV Piggyback:600] Out: 2985 [Urine:2985]   Intake/Output this shift:     Physical Exam: General: Critically ill  Head: +ETT, +NGT  Eyes: Anicteric, PERRL  Neck: Left IJ TLC catheter  Lungs:   intubated, ventilated   Heart: regular  Abdomen:  Soft, nontender, obese  Extremities: 2+ peripheral and dependent edema.  Neurologic: intubated  Skin: No lesions  GU: foley    Basic Metabolic Panel:  Recent Labs Lab 09/24/15 0512 09/25/15 0510 09/26/15 0513 09/27/15 0338 09/28/15 0252  NA 139 141 144 144 142  K 4.1 3.9 4.1 4.0 4.1  CL 96* 98* 100* 100* 100*  CO2 36* 38* 38* 39* 37*  GLUCOSE 127* 130* 129* 124* 124*  BUN 56* 50* 54* 52* 49*  CREATININE 1.61* 1.16 1.14 0.93 0.91  CALCIUM 8.3* 8.2* 8.6* 8.6* 8.6*  PHOS  --  2.7  --   --   --     Liver Function Tests:  Recent Labs Lab 09/22/15 0318 09/23/15 0447 09/24/15 0512 09/25/15 0510  AST 19 21 19   --   ALT 10* 10* 10*  --   ALKPHOS 71 70 55  --   BILITOT 3.5* 3.8* 4.1*  --   PROT 7.3 7.3 6.6  --   ALBUMIN 3.2* 3.3* 3.0* 2.7*   No results for input(s): LIPASE, AMYLASE in the last 168 hours. No results for input(s): AMMONIA in the last 168 hours.  CBC:  Recent Labs Lab 09/24/15 0512 09/25/15 0510 09/26/15 0513 09/27/15 0338  09/28/15 0252  WBC 10.6 10.5 11.5* 9.6 9.8  NEUTROABS 6.7*  --   --   --   --   HGB 14.0 13.6 13.1 12.4* 12.1*  HCT 45.9 44.3 42.7 40.1 40.3  MCV 82.7 82.7 83.6 82.8 82.2  PLT 71* 76* 82* 98* 108*    Cardiac Enzymes:  Recent Labs Lab 09/22/15 2224 09/23/15 0447 09/23/15 0848  TROPONINI <0.03 <0.03 <0.03    BNP: Invalid input(s): POCBNP  CBG:  Recent Labs Lab 09/26/15 2321 09/27/15 0754 09/27/15 1616 09/27/15 2346 09/28/15 0717  GLUCAP 127* 114* 116* 121* 144*    Microbiology: Results for orders placed or performed during the hospital encounter of 08/31/2015  Blood culture (routine x 2)     Status: None   Collection Time: 09/04/2015 11:53 PM  Result Value Ref Range Status   Specimen Description BLOOD LEFT HAND  Final   Special Requests BOTTLES DRAWN AEROBIC AND ANAEROBIC 4CC  Final   Culture NO GROWTH 5 DAYS  Final   Report Status 09/23/2015 FINAL  Final  Blood culture (routine x 2)     Status: None   Collection Time: 08/27/2015 11:53 PM  Result Value Ref Range Status   Specimen Description BLOOD LEFT  ARM  Final   Special Requests BOTTLES DRAWN AEROBIC AND ANAEROBIC 5CC  Final   Culture NO GROWTH 5 DAYS  Final   Report Status 09/23/2015 FINAL  Final  Urine culture     Status: None   Collection Time: 09/18/15 12:21 AM  Result Value Ref Range Status   Specimen Description URINE, RANDOM  Final   Special Requests NONE  Final   Culture MULTIPLE SPECIES PRESENT, SUGGEST RECOLLECTION  Final   Report Status 09/19/2015 FINAL  Final  Rapid Influenza A&B Antigens ([facility] only)     Status: None   Collection Time: 09/18/15  6:26 AM  Result Value Ref Range Status   Influenza A ([facility]) NOT DETECTED  Final   Influenza B ([facility]) NOT DETECTED  Final  MRSA PCR Screening     Status: None   Collection Time: 09/20/15  6:30 AM  Result Value Ref Range Status   MRSA by PCR NEGATIVE NEGATIVE Final    Comment:        The GeneXpert MRSA Assay (FDA approved for NASAL specimens only), is  one component of a comprehensive MRSA colonization surveillance program. It is not intended to diagnose MRSA infection nor to guide or monitor treatment for MRSA infections.   Culture, respiratory (NON-Expectorated)     Status: None   Collection Time: 09/22/15  3:13 PM  Result Value Ref Range Status   Specimen Description TRACHEAL ASPIRATE  Final   Special Requests NONE  Final   Gram Stain   Final    GOOD SPECIMEN - 80-90% WBCS MODERATE WBC SEEN RARE YEAST RARE GRAM NEGATIVE COCCOBACILLI    Culture LIGHT GROWTH CANDIDA TROPICALIS  Final   Report Status 09/26/2015 FINAL  Final  Urine culture     Status: None   Collection Time: 09/22/15 11:16 PM  Result Value Ref Range Status   Specimen Description URINE, RANDOM  Final   Special Requests NONE  Final   Culture NO GROWTH 2 DAYS  Final   Report Status 09/24/2015 FINAL  Final  CULTURE, BLOOD (ROUTINE X 2) w Reflex to PCR ID Panel     Status: None   Collection Time: 09/22/15 11:44 PM  Result Value Ref Range Status   Specimen Description BLOOD RIGHT ANTECUBITAL  Final   Special Requests BOTTLES DRAWN AEROBIC AND ANAEROBIC  Final   Culture NO GROWTH 5 DAYS  Final   Report Status 09/28/2015 FINAL  Final  CULTURE, BLOOD (ROUTINE X 2) w Reflex to PCR ID Panel     Status: None   Collection Time: 09/22/15 11:56 PM  Result Value Ref Range Status   Specimen Description BLOOD LEFT HAND  Final   Special Requests BOTTLES DRAWN AEROBIC AND ANAEROBIC  Final   Culture NO GROWTH 5 DAYS  Final   Report Status 09/28/2015 FINAL  Final  Culture, respiratory (NON-Expectorated)     Status: None   Collection Time: 09/23/15 12:01 AM  Result Value Ref Range Status   Specimen Description TRACHEAL ASPIRATE  Final   Special Requests NONE  Final   Gram Stain   Final    GOOD SPECIMEN - 80-90% WBCS MODERATE WBC SEEN RARE YEAST RARE GRAM POSITIVE COCCI    Culture Consistent with normal respiratory flora.  Final   Report Status 09/25/2015 FINAL   Final    Coagulation Studies: No results for input(s): LABPROT, INR in the last 72 hours.  Urinalysis: No results for input(s): COLORURINE, LABSPEC, PHURINE, GLUCOSEU, HGBUR, BILIRUBINUR, KETONESUR, PROTEINUR, UROBILINOGEN,  NITRITE, LEUKOCYTESUR in the last 72 hours.  Invalid input(s): APPERANCEUR    Imaging: Dg Chest 1 View  09/27/2015  CLINICAL DATA:  Shortness of breath. EXAM: CHEST 1 VIEW COMPARISON:  09/26/2015. FINDINGS: Endotracheal tube, left IJ line, NG tube in stable position . The cardiomegaly with pulmonary vascular prominence and bilateral pulmonary infiltrates consistent with pulmonary edema, no significant interim change. Right mid lung and basilar atelectasis. No pleural effusion or pneumothorax. IMPRESSION: 1. Lines and tubes in stable position. 2. Cardiomegaly with pulmonary vascular prominence and persistent bilateral pulmonary infiltrates consistent pulmonary edema. No significant interim change. 3. Lung volumes with persistent basilar atelectasis . Electronically Signed   By: Maisie Fushomas  Register   On: 09/27/2015 08:02   Dg Ankle 2 Views Right  09/27/2015  CLINICAL DATA:  Injury.  Possible fracture. EXAM: RIGHT ANKLE - 2 VIEW COMPARISON:  None. FINDINGS: The mineralization and alignment are normal. There is no evidence of acute fracture or dislocation. The joint spaces appear adequately maintained. There are extensive soft tissue calcifications within the lower leg, suggesting underlying connective tissue disorder or venous stasis. IMPRESSION: No acute osseous findings. Electronically Signed   By: Carey BullocksWilliam  Veazey M.D.   On: 09/27/2015 17:30   Dg Foot 2 Views Right  09/27/2015  CLINICAL DATA:  Pain following trauma EXAM: RIGHT FOOT - 2 VIEW COMPARISON:  None. FINDINGS: Frontal and lateral views were obtained. There is soft tissue edema. There is no demonstrable fracture or dislocation. Joint spaces appear intact. No erosive change. IMPRESSION: Generalized soft tissue swelling. No  fracture or dislocation. No appreciable arthropathy. Electronically Signed   By: Bretta BangWilliam  Woodruff III M.D.   On: 09/27/2015 17:28     Medications:   . feeding supplement (VITAL HIGH PROTEIN) 1,000 mL (09/28/15 1049)  . fentaNYL infusion INTRAVENOUS 10 mcg/hr (09/28/15 1048)  . propofol (DIPRIVAN) infusion 35 mcg/kg/min (09/28/15 1224)   . antiseptic oral rinse  7 mL Mouth Rinse 10 times per day  . bisacodyl  10 mg Rectal Once  . budesonide  0.25 mg Nebulization 4 times per day  . chlorhexidine gluconate  15 mL Mouth Rinse BID  . enoxaparin (LOVENOX) injection  40 mg Subcutaneous Q12H  . erythromycin  500 mg Intravenous 3 times per day  . famotidine  20 mg Per Tube BID  . feeding supplement (PRO-STAT SUGAR FREE 64)  30 mL Oral 6 times per day  . free water  200 mL Per Tube 3 times per day  . ipratropium-albuterol  3 mL Nebulization Q6H  . metoprolol  5 mg Intravenous 4 times per day  . nystatin   Topical BID  . polyethylene glycol  17 g Per Tube Daily  . senna-docusate  3 tablet Oral BID  . sodium chloride  3 mL Intravenous Q12H  . sodium chloride  3 mL Intravenous Q12H   sodium chloride, acetaminophen **OR** acetaminophen, albuterol, bisacodyl, calcium carbonate, fentaNYL (SUBLIMAZE) injection, LORazepam, ondansetron **OR** ondansetron (ZOFRAN) IV, polyethylene glycol, sodium chloride, sodium chloride  Assessment/ Plan:  Mr. Mitchell Morrow is a 40 y.o. black male with morbid obesity, tobacco abuse, without prescribed home medications, who was admitted to [facility] on 08/28/2015   1. Acute Renal Failure: nonoliguric urine output.  Creatinine now back to baseline. Several episodes of hypotension, overdiuresis and currently with sepsis.     2. Acute respiratory failure with bilateral pneumonia and acute exacerbation of systolic congestive heart failure: not currently on diuretics.  Plan for trach per nursing  Will sign off -please  call back if there are any further questions   LOS:  11 Mitchell Morrow 1/10/201712:28 PM

## 2015-09-28 NOTE — Progress Notes (Signed)
Nutrition Follow-up    INTERVENTION:   EN: recommend continuing current TF regimen, if able to titrate diprivan down or off will reassess TF goal rate. Continue to assess  NUTRITION DIAGNOSIS:   Inadequate oral intake related to inability to eat as evidenced by NPO status.  GOAL:   Patient will meet greater than or equal to 90% of their needs  MONITOR:    (Energy Intake, Digestive System, Pulmonary Profile, Electrolyte and Renal Profile, Anthropometrics)  REASON FOR ASSESSMENT:   Ventilator, Consult Enteral/tube feeding initiation and management  ASSESSMENT:   Pt admitted with acute hypoxic and hypercapnic resp failure with acute metabolic encephalopathy from cocaine abuse leading to CHF and encephalopathy per MD note. Pt intubated this am.   Pt remains on vent, plan for trach tentatively on Monday 1/16  EN: tolerating Vital High Protein at rate of 20 ml/hr with Prostat 6 packets  Skin:  Reviewed, no issues  Last BM:  09/16/2015   Digestive System: no signs of TF intolerance but remains constipated  Electrolyte and Renal Profile:  Recent Labs Lab 09/25/15 0510 09/26/15 0513 09/27/15 0338 09/28/15 0252  BUN 50* 54* 52* 49*  CREATININE 1.16 1.14 0.93 0.91  NA 141 144 144 142  K 3.9 4.1 4.0 4.1  PHOS 2.7  --   --   --    Glucose Profile:  Recent Labs  09/27/15 1616 09/27/15 2346 09/28/15 0717  GLUCAP 116* 121* 144*   Meds: erythromycin, weaning diprivan, on bowel regimen  Height:   Ht Readings from Last 1 Encounters:  09/20/15 5\' 9"  (1.753 m)    Weight:   Wt Readings from Last 1 Encounters:  09/27/15 560 lb 3 oz (254.1 kg)    Ideal Body Weight:     BMI:  Body mass index is 82.69 kg/(m^2).  Estimated Nutritional Needs:   Kcal:  1600-1817kcals (22-25kcals/kg) using  IBW of 72.7kg  Protein:  145-181g protein (2.0-2.5g/kg) using IBW of 72.7kg  Fluid:  2181-253445mL of fluid (30-4935mL/kg)  EDUCATION NEEDS:   No education needs identified  at this time  HIGH Care Level  Romelle Starcherate Shanzay Hepworth MS, RD, LDN [phone] Pager  [phone] Weekend/On-Call Pager

## 2015-09-28 NOTE — Progress Notes (Signed)
Pt became sinus tach, hr in the 140's at 1:00am.  Elink physician was called.  Propofol was increased, fentanyl was given and also ativan as ordered but without relief.  Pt's fever has been 99 to 100 degress Fahrenheit  oral overnight but nothing higher. Hemoglobin level was checked early and resulted at 12.1.  MD ordered to give one liter normal saline bolus for possible hypovolemia.  Pt remained sinus tach in the 140's one hour later.  MD was again paged.  Per MD give one more liter normal saline bolus.  Miralax was also given to aid in bowel movement to possibly lower heart rate some.  At this time, pt has still not had a bowel movement.  Scheduled metoprolol was given at 5:00 am which brought hr down to the 120's.

## 2015-09-28 NOTE — Progress Notes (Signed)
eLink Physician-Brief Progress Note Patient Name: Mitchell Morrow DOB: 12/20/1975 MRN: [id number]   Date of Service  09/28/2015  HPI/Events of Note  Multiple issues: 1. Remains in Sinus Tachycardia - HR = 147. Not due for another dose of Metoprolol IV until 6 AM. 2. Constipation.   eICU Interventions  Will order: 1. 0.9 NaCl 1 liter IV over 1 hour now.  2. Dulcolax Suppository 10 mg PR Q day PRN.     Intervention Category Major Interventions: Arrhythmia - evaluation and management Minor Interventions: Other:  Sommer,Steven Dennard Nipugene 09/28/2015, 2:36 AM

## 2015-09-28 NOTE — Progress Notes (Signed)
eLink Physician-Brief Progress Note Patient Name: Mitchell Morrow DOB: 09/01/1976 MRN: [id number]   Date of Service  09/28/2015  HPI/Events of Note  Sinus Tachycardia - HR = 144. Patient is already sedated with Propofol and Fentanyl PRN. Bedside nurse has already increased the Propofol IV infusion and given Fentanyl PRN. Nurse states no fever at this time. Patient is also on Metoprolol 5 mg IV Q 6 hours.   eICU Interventions  Will order: 1. Bolus with 0.9 NaCl 1 liter IV over 1 hour now.      Intervention Category Major Interventions: Arrhythmia - evaluation and management  Sommer,Steven Eugene 09/28/2015, 1:11 AM

## 2015-09-28 NOTE — Progress Notes (Signed)
PHARMACY - CRITICAL CARE PROGRESS NOTE  Pharmacy Consult for Constipation Prevention.  Indication: ICU Status   No Known Allergies  Patient Measurements: Height: 5\' 9"  (175.3 cm) Weight:  (bed not weighing pt.) IBW/kg (Calculated) : 70.7   Vital Signs: Temp: 99.3 F (37.4 C) (01/10 1300) BP: 97/68 mmHg (01/10 1500) Pulse Rate: 133 (01/10 1500) Intake/Output from previous day: 01/09 0701 - 01/10 0700 In: 2341.5 [I.V.:1281.5; NG/GT:760; IV Piggyback:300] Out: 1675 [Urine:1675] Intake/Output from this shift:   Vent settings for last 24 hours: Vent Mode:  [-] PRVC FiO2 (%):  [50 %-60 %] 50 % Set Rate:  [16 bmp] 16 bmp Vt Set:  [500 mL] 500 mL PEEP:  [8 cmH20] 8 cmH20 Plateau Pressure:  [26 cmH20-28 cmH20] 28 cmH20  Labs:  Recent Labs  09/26/15 0513 09/27/15 0338 09/28/15 0252  WBC 11.5* 9.6 9.8  HGB 13.1 12.4* 12.1*  HCT 42.7 40.1 40.3  PLT 82* 98* 108*  CREATININE 1.14 0.93 0.91   Estimated Creatinine Clearance: 217.7 mL/min (by C-G formula based on Cr of 0.91).   Recent Labs  09/27/15 2346 09/28/15 0717 09/28/15 1556  GLUCAP 121* 144* 144*    Microbiology: Recent Results (from the past 720 hour(s))  Blood culture (routine x 2)     Status: None   Collection Time: 2015-10-07 11:53 PM  Result Value Ref Range Status   Specimen Description BLOOD LEFT HAND  Final   Special Requests BOTTLES DRAWN AEROBIC AND ANAEROBIC 4CC  Final   Culture NO GROWTH 5 DAYS  Final   Report Status 09/23/2015 FINAL  Final  Blood culture (routine x 2)     Status: None   Collection Time: 07-Oct-2015 11:53 PM  Result Value Ref Range Status   Specimen Description BLOOD LEFT ARM  Final   Special Requests BOTTLES DRAWN AEROBIC AND ANAEROBIC 5CC  Final   Culture NO GROWTH 5 DAYS  Final   Report Status 09/23/2015 FINAL  Final  Urine culture     Status: None   Collection Time: 09/18/15 12:21 AM  Result Value Ref Range Status   Specimen Description URINE, RANDOM  Final   Special  Requests NONE  Final   Culture MULTIPLE SPECIES PRESENT, SUGGEST RECOLLECTION  Final   Report Status 09/19/2015 FINAL  Final  Rapid Influenza A&B Antigens ([facility] only)     Status: None   Collection Time: 09/18/15  6:26 AM  Result Value Ref Range Status   Influenza A ([facility]) NOT DETECTED  Final   Influenza B ([facility]) NOT DETECTED  Final  MRSA PCR Screening     Status: None   Collection Time: 09/20/15  6:30 AM  Result Value Ref Range Status   MRSA by PCR NEGATIVE NEGATIVE Final    Comment:        The GeneXpert MRSA Assay (FDA approved for NASAL specimens only), is one component of a comprehensive MRSA colonization surveillance program. It is not intended to diagnose MRSA infection nor to guide or monitor treatment for MRSA infections.   Culture, respiratory (NON-Expectorated)     Status: None   Collection Time: 09/22/15  3:13 PM  Result Value Ref Range Status   Specimen Description TRACHEAL ASPIRATE  Final   Special Requests NONE  Final   Gram Stain   Final    GOOD SPECIMEN - 80-90% WBCS MODERATE WBC SEEN RARE YEAST RARE GRAM NEGATIVE COCCOBACILLI    Culture LIGHT GROWTH CANDIDA TROPICALIS  Final   Report Status 09/26/2015 FINAL  Final  Urine culture     Status: None   Collection Time: 09/22/15 11:16 PM  Result Value Ref Range Status   Specimen Description URINE, RANDOM  Final   Special Requests NONE  Final   Culture NO GROWTH 2 DAYS  Final   Report Status 09/24/2015 FINAL  Final  CULTURE, BLOOD (ROUTINE X 2) w Reflex to PCR ID Panel     Status: None   Collection Time: 09/22/15 11:44 PM  Result Value Ref Range Status   Specimen Description BLOOD RIGHT ANTECUBITAL  Final   Special Requests BOTTLES DRAWN AEROBIC AND ANAEROBIC 5ML  Final   Culture NO GROWTH 5 DAYS  Final   Report Status 09/28/2015 FINAL  Final  CULTURE, BLOOD (ROUTINE X 2) w Reflex to PCR ID Panel     Status: None   Collection Time: 09/22/15 11:56 PM  Result Value Ref Range Status   Specimen Description  BLOOD LEFT HAND  Final   Special Requests BOTTLES DRAWN AEROBIC AND ANAEROBIC 5ML  Final   Culture NO GROWTH 5 DAYS  Final   Report Status 09/28/2015 FINAL  Final  Culture, respiratory (NON-Expectorated)     Status: None   Collection Time: 09/23/15 12:01 AM  Result Value Ref Range Status   Specimen Description TRACHEAL ASPIRATE  Final   Special Requests NONE  Final   Gram Stain   Final    GOOD SPECIMEN - 80-90% WBCS MODERATE WBC SEEN RARE YEAST RARE GRAM POSITIVE COCCI    Culture Consistent with normal respiratory flora.  Final   Report Status 09/25/2015 FINAL  Final    Medications:  Scheduled:  . antiseptic oral rinse  7 mL Mouth Rinse 10 times per day  . budesonide  0.25 mg Nebulization 4 times per day  . chlorhexidine gluconate  15 mL Mouth Rinse BID  . enoxaparin (LOVENOX) injection  40 mg Subcutaneous Q12H  . erythromycin  500 mg Intravenous 3 times per day  . famotidine  20 mg Per Tube BID  . feeding supplement (PRO-STAT SUGAR FREE 64)  30 mL Oral 6 times per day  . free water  200 mL Per Tube 3 times per day  . ipratropium-albuterol  3 mL Nebulization Q6H  . metoprolol  5 mg Intravenous 4 times per day  . nystatin   Topical BID  . polyethylene glycol  17 g Per Tube Daily  . senna-docusate  3 tablet Oral BID  . sodium chloride  3 mL Intravenous Q12H  . sodium chloride  3 mL Intravenous Q12H   Infusions:  . feeding supplement (VITAL HIGH PROTEIN) 1,000 mL (09/28/15 1049)  . fentaNYL infusion INTRAVENOUS 10 mcg/hr (09/28/15 1048)  . propofol (DIPRIVAN) infusion 25 mcg/kg/min (09/28/15 1617)    Assessment: Pharmacy consulted for constipation management for 40 yo male ICU patient requiring mechanical ventilation.     Plan:  Will continue patient on senna/docusate 3 tabs BID and miralax. Will order additional bisacodyl suppository x 1.    Pharmacy will continue to monitor and adjust per consult.    Osaze Hubbert L 09/28/2015,4:23 PM

## 2015-09-28 NOTE — Progress Notes (Signed)
PULMONARY/CCM PROGRESS NOTE  Active problems: Acute ventilator dependent hypercapneic respiratory failure-failed extubation x 1 with morbid obesity Vasopressor dependent hypotension AKI - baseline Cr 1.06, continue to monitor.  Morbid obesity Smoker Cor pulmonale Hyperbilirubinemia - likely cholestasis Thrombocytopenia - appears chronic  Lines, Tubes, etc: ETT 01/02 >> . Patient self extubated, subsequently reintubated 09/25/2015 L IJ CVL 01/02 >>   Microbiology: Blood 12/30 >>  MRSA PCR 01/02 >> NEG Urine 01/04 >> NEG Resp 01/04 >> rare Gram neg coccobacilli >>  Resp 01/05 >> rare GPCs >>  Blood 01/04 >>    PCT 01/04: 0.74  Antibiotics:  Levofloxacin 12/30 >> 01/01 Ceftriaxone 01/02 >> 01/02 Azithro 01/02 >> 01/02 Levofloxacin 01/04 >> 01/05 Vanc 01/05 >> 1/9 Ceftaz 01/05 >>1/9   PCT 01/04: 0.74, 0.95, 0.77  Studies/Events: 12/31 TTE:  poor quality study, LVEF 45% 12/31 RUQ: Nodular contour the liver raising the question of cirrhosis. Gallbladder wall thickening, nonspecific in appearance 01/04 night - fever, hypotension. Cultures obtained. Abx expanded  Consultants:  Renal 01/05: no acute indication for HD   Best Practice: DVT: enoxaparin SUP: enteral famotidine Nutrition: TFs Glycemic control: N/I Sedation/analgesia: fent/dexmedetomidine infusion. PRN lorazepam    Subj: Patient remains intubated,sedated Will need trach for survival -ENT consulted  Obj: Filed Vitals:   09/28/15 0500 09/28/15 0600 09/28/15 0700 09/28/15 0804  BP: 93/62 100/63 96/63   Pulse:      Temp:      TempSrc:      Resp: 18 19 18    Height:      Weight:      SpO2: 100%   100%    Gen: morbidly obese HEENT:  WNL Neck: CVL site clean, JVP cannot be assessed Chest: clear today somewhat prolonged exp time Cardiac: distant HS, regular, no M noted Abd: obese, soft, + BS Ext: symmetric edema, chronic stasis changes Neuro: GCS<8T  BMET    Component Value Date/Time   NA 142 09/28/2015 0252   K 4.1 09/28/2015 0252   CL 100* 09/28/2015 0252   CO2 37* 09/28/2015 0252   GLUCOSE 124* 09/28/2015 0252   BUN 49* 09/28/2015 0252   CREATININE 0.91 09/28/2015 0252   CALCIUM 8.6* 09/28/2015 0252   GFRNONAA >60 09/28/2015 0252   GFRAA >60 09/28/2015 0252    CBC    Component Value Date/Time   WBC 9.8 09/28/2015 0252   RBC 4.90 09/28/2015 0252   HGB 12.1* 09/28/2015 0252   HCT 40.3 09/28/2015 0252   PLT 108* 09/28/2015 0252   MCV 82.2 09/28/2015 0252   MCH 24.6* 09/28/2015 0252   MCHC 29.9* 09/28/2015 0252   RDW 20.2* 09/28/2015 0252   LYMPHSABS 2.1 09/24/2015 0512   MONOABS 1.5* 09/24/2015 0512   EOSABS 0.3 09/24/2015 0512   BASOSABS 0.0 09/24/2015 0512    CXR:  New linear atelectasis in the right lung apex. Persistent bibasilar atelectasis and effusions   IMPRESSION: 1) Acute on chronic hypercarbic respiratory failure - multifactorial 2) Obesity hypoventilation syndrome 3) Atelectasis 4) Pulm edema vs ALI/ARDS 5) Fever, hypotension, suspect severe sepsis 01/05 - likely due to PNA 6) Hypotension resolved 7) AKI, non-oliguric - Cr improving 01/06 8) Mild hyperglycemia without prior dx of DM 9) Elevated bilirubin - likely cholestasis. Rest of LFTs normal. RUQ US unrevealing 10) Thrombocytopenia, unknown chronicity - stable 11) ICU associated discomfort  PLAN/RECS:  Cont vent support - settings reviewed and/or adjusted Plan for Trach-ENT consulted-trach to be placed Cont vent bundle Follow CXR intermittently Daily SBT if/when meets criteria  MAP goal > 65 mmHg Monitor BMET intermittently Monitor I/Os Correct electrolytes as indicated Cont TFs Bowel regimen initiated 01/06--appears at goal, will continue.  Monitor glucose. Consider SSI for glu > 180  I have personally obtained a history, examined the patient, evaluated Pertinent laboratory and [employer]/imaging results, and  formulated the assessment and plan   The Patient requires  high complexity decision making for assessment and support, frequent evaluation and titration of therapies, application of advanced monitoring technologies and extensive interpretation of multiple databases. Critical Care Time devoted to patient care services described in this note is 35 minutes.   Overall, patient is critically ill, prognosis is guarded.   Lucie Leather, M.D.  [facility]  Medical Director [facility] Medical Director [facility] Cardio-Pulmonary Department

## 2015-09-28 NOTE — Progress Notes (Signed)
[facility] - [place] at [facility]   PATIENT NAME: Mitchell Morrow    MR#:  [id number]  DATE OF BIRTH:  [birth year]  SUBJECTIVE:  No acute issues overnight.  REVIEW OF SYSTEMS:   Review of Systems  Unable to perform ROS: intubated    DRUG ALLERGIES:  No Known Allergies  VITALS:  Blood pressure 97/64, pulse 107, temperature 100 F (37.8 C), temperature source Oral, resp. rate 20, height 5\' 9"  (1.753 m), weight 254.1 kg (560 lb 3 oz), SpO2 100 %.  PHYSICAL EXAMINATION:   Physical Exam  Constitutional: He is well-developed, well-nourished, and in no distress. No distress.  Morbidly obese  HENT:  Head: Normocephalic.  Eyes: No scleral icterus.  Neck: No JVD present. No tracheal deviation present.  Cardiovascular: Normal rate and regular rhythm.  Exam reveals no gallop and no friction rub.   No murmur heard. Distant heart sounds  Pulmonary/Chest: Effort normal and breath sounds normal. No respiratory distress. He has no wheezes. He has no rales. He exhibits no tenderness.  Abdominal: Soft. Bowel sounds are normal. He exhibits no distension and no mass. There is no tenderness. There is no rebound and no guarding.  Musculoskeletal: Normal range of motion. He exhibits edema.  Neurological:  Sedated on vent  Skin: Skin is warm. No rash noted. No erythema.  Psychiatric: Affect and judgment normal.   LABORATORY PANEL:   CBC  Recent Labs Lab 09/28/15 0252  WBC 9.8  HGB 12.1*  HCT 40.3  PLT 108*   ------------------------------------------------------------------------------------------------------------------  Chemistries   Recent Labs Lab 09/24/15 0512  09/28/15 0252  NA 139  < > 142  K 4.1  < > 4.1  CL 96*  < > 100*  CO2 36*  < > 37*  GLUCOSE 127*  < > 124*  BUN 56*  < > 49*  CREATININE 1.61*  < > 0.91  CALCIUM 8.3*  < > 8.6*  AST 19  --   --   ALT 10*  --   --   ALKPHOS 55  --   --   BILITOT 4.1*  --   --   < > = values in this  interval not displayed. ------------------------------------------------------------------------------------------------------------------  Cardiac Enzymes  Recent Labs Lab 09/23/15 0447 09/23/15 0848  TROPONINI <0.03 <0.03   ------------------------------------------------------------------------------------------------------------------  RADIOLOGY:  Dg Chest 1 View  09/27/2015  CLINICAL DATA:  Shortness of breath. EXAM: CHEST 1 VIEW COMPARISON:  09/26/2015. FINDINGS: Endotracheal tube, left IJ line, NG tube in stable position . The cardiomegaly with pulmonary vascular prominence and bilateral pulmonary infiltrates consistent with pulmonary edema, no significant interim change. Right mid lung and basilar atelectasis. No pleural effusion or pneumothorax. IMPRESSION: 1. Lines and tubes in stable position. 2. Cardiomegaly with pulmonary vascular prominence and persistent bilateral pulmonary infiltrates consistent pulmonary edema. No significant interim change. 3. Lung volumes with persistent basilar atelectasis . Electronically Signed   By: Maisie Fus  Register   On: 09/27/2015 08:02   Dg Ankle 2 Views Right  09/27/2015  CLINICAL DATA:  Injury.  Possible fracture. EXAM: RIGHT ANKLE - 2 VIEW COMPARISON:  None. FINDINGS: The mineralization and alignment are normal. There is no evidence of acute fracture or dislocation. The joint spaces appear adequately maintained. There are extensive soft tissue calcifications within the lower leg, suggesting underlying connective tissue disorder or venous stasis. IMPRESSION: No acute osseous findings. Electronically Signed   By: Carey Bullocks M.D.   On: 09/27/2015 17:30   Dg Foot 2 Views  Right  09/27/2015  CLINICAL DATA:  Pain following trauma EXAM: RIGHT FOOT - 2 VIEW COMPARISON:  None. FINDINGS: Frontal and lateral views were obtained. There is soft tissue edema. There is no demonstrable fracture or dislocation. Joint spaces appear intact. No erosive change.  IMPRESSION: Generalized soft tissue swelling. No fracture or dislocation. No appreciable arthropathy. Electronically Signed   By: Bretta BangWilliam  Woodruff III M.D.   On: 09/27/2015 17:28    ASSESSMENT AND PLAN:   40 year old male with a history of morbid obesity and tobacco abuse who presented with lower extremity edema and found to have bilateral pneumonia.  1. Acute hypercapnic respiratory failure-: This is multifactorial and thought to be due to obesity hypoventilation syndrome, community-acquired pneumonia and pulmonary edema versus ARDS.  Vent management as per pulmonary.  Continue IV antibiotics  Patient will need ENT for tracheostomy which is tentatively planned for Monday. He would benefit from long-term acute care facility. However, due to insurance reasons he is not a candidate for LTAC.  2. Bilateral community-acquired pneumonia with septic shock He was on vancomycin and ceftazidime. These have now been stopped and he has been treated for the full course. Sputum culture still showing rare gram-negative cocci bacilli. Blood cultures are negative to date. He is off of pressors.     3.New onset systolic congestive heart failure,  EF 45%  - has elevated BNP, edema and vascular congestion on chest x-ray  Stopped IV Lasix due to worsening renal function  Monitor input and output along with daily weight. As per nephrology consultation if further diuresis is required consider. Lasix drip as he is unable to handle large Lasix boluses at this time.   4. Acute renal failure - Due to ATN and diuresis Appreciate nephrology help Creatinine has improved.   5. Cutaneous candidiasis nystatin powder  6. Morbid obesity will need bariatric referral as outpatient Patient will also need sleep evaluation if he comes off of trach.  7. Thrombocytopenia - without acute bleeding  Etiology unclear. No old labs available.  No aspirin, heparin or Lovenox.  negative hepatitis panel, negative  HIV. Could also be due to acute infection.  Platelet count improving.  8.Hyperbilirubinemia could be passive congestion from congestive heart failure. hepatitis panel and ultrasound of right upper quadrant suggestive of cirrhotic changes. US showed Cirrhosis 9. Sinus tachycardia: Discussed with Dr.Kasa. Continue to monitor.  * DVT prophylaxis with SCDs.   CODE STATUS: full.  Critical care TOTAL TIME TAKING CARE OF THIS PATIENT: 30 critical care minutes.   Patient would benefit from LTAC placement. Niki Cosman M.D on 09/28/2015   Between 7am to 6pm - Pager - [phone]  After 6pm go to [email or web address] - password [email or web address] [facility]  [facility]  Office  [phone]  CC: Primary care physician; No PCP Per Patient  Note: This dictation was prepared with Dragon dictation along with smaller phrase technology. Any transcriptional errors that result from this process are unintentional.

## 2015-09-29 LAB — CBC
HCT: 40.7 % (ref 40.0–52.0)
Hemoglobin: 12.4 g/dL — ABNORMAL LOW (ref 13.0–18.0)
MCH: 25.4 pg — AB (ref 26.0–34.0)
MCHC: 30.4 g/dL — AB (ref 32.0–36.0)
MCV: 83.6 fL (ref 80.0–100.0)
PLATELETS: 141 10*3/uL — AB (ref 150–440)
RBC: 4.87 MIL/uL (ref 4.40–5.90)
RDW: 20.2 % — AB (ref 11.5–14.5)
WBC: 11.4 10*3/uL — ABNORMAL HIGH (ref 3.8–10.6)

## 2015-09-29 LAB — MISC LABCORP TEST (SEND OUT): LABCORP TEST CODE: 9985

## 2015-09-29 LAB — BASIC METABOLIC PANEL
Anion gap: 5 (ref 5–15)
BUN: 64 mg/dL — AB (ref 6–20)
CHLORIDE: 100 mmol/L — AB (ref 101–111)
CO2: 36 mmol/L — AB (ref 22–32)
CREATININE: 1.31 mg/dL — AB (ref 0.61–1.24)
Calcium: 8.5 mg/dL — ABNORMAL LOW (ref 8.9–10.3)
GFR calc Af Amer: >60 mL/min (ref >60)
GFR calc non Af Amer: >60 mL/min (ref >60)
GLUCOSE: 125 mg/dL — AB (ref 65–99)
Potassium: 4.3 mmol/L (ref 3.5–5.1)
SODIUM: 141 mmol/L (ref 135–145)

## 2015-09-29 LAB — GLUCOSE, CAPILLARY
GLUCOSE-CAPILLARY: 116 mg/dL — AB (ref 65–99)
GLUCOSE-CAPILLARY: 131 mg/dL — AB (ref 65–99)
GLUCOSE-CAPILLARY: 138 mg/dL — AB (ref 65–99)
Glucose-Capillary: 120 mg/dL — ABNORMAL HIGH (ref 65–99)
Glucose-Capillary: 130 mg/dL — ABNORMAL HIGH (ref 65–99)

## 2015-09-29 MED ORDER — LORAZEPAM 2 MG/ML IJ SOLN
2.0000 mg | INTRAMUSCULAR | Status: DC | PRN
  Administered 2015-09-30 – 2015-10-06 (×9): 2 mg via INTRAVENOUS
  Filled 2015-09-29 (×8): qty 1

## 2015-09-29 MED ORDER — LACTULOSE 10 GM/15ML PO SOLN
30.0000 g | Freq: Three times a day (TID) | ORAL | Status: DC
Start: 2015-09-29 — End: 2015-09-29
  Administered 2015-09-29: 30 g via ORAL
  Filled 2015-09-29: qty 60

## 2015-09-29 MED ORDER — SODIUM CHLORIDE 0.9 % IV SOLN
INTRAVENOUS | Status: DC
  Administered 2015-09-29 – 2015-10-05 (×5): via INTRAVENOUS

## 2015-09-29 MED ORDER — VITAL HIGH PROTEIN PO LIQD
1000.0000 mL | ORAL | Status: DC
  Administered 2015-09-30: 1000 mL

## 2015-09-29 MED ORDER — PRO-STAT SUGAR FREE PO LIQD
30.0000 mL | Freq: Four times a day (QID) | ORAL | Status: DC
  Administered 2015-09-29 – 2015-09-30 (×4): 30 mL via ORAL

## 2015-09-29 NOTE — Progress Notes (Signed)
ENT progress note.   Discussed tracheostomy with the patients mother in detail. She understands the procedure and wishes to proceed. She has no further questions and consent was given over the phone to the nurses.  Surgery is scheduled for Monday morning.  Will hold tube feedings Sunday night at midnight.  Hold Lovenox the night before.  OK for surgery.   Vadim Centola,MD 6:30 pm 09/29/2015.

## 2015-09-29 NOTE — Progress Notes (Signed)
[facility] - [place] at [facility]   PATIENT NAME: Mitchell Morrow    MR#:  [id number]  DATE OF BIRTH:  08/03/1976  SUBJECTIVE:  Patient still with sinus tachycardia. Blood pressure remains low normal however map is greater than 60. He is off pressors.Marland Kitchen.  REVIEW OF SYSTEMS:   Review of Systems  Unable to perform ROS: intubated    DRUG ALLERGIES:  No Known Allergies  VITALS:  Blood pressure 97/49, pulse 128, temperature 99.3 F (37.4 C), temperature source Oral, resp. rate 20, height 5\' 9"  (1.753 m), weight 278.3 kg (613 lb 8.6 oz), SpO2 93 %.  PHYSICAL EXAMINATION:   Physical Exam  Constitutional: He is well-developed, well-nourished, and in no distress. No distress.  Morbidly obese  HENT:  Head: Normocephalic.  Eyes: No scleral icterus.  Neck: No JVD present. No tracheal deviation present.  Cardiovascular: Normal rate and regular rhythm.  Exam reveals no gallop and no friction rub.   No murmur heard. Distant heart sounds  Pulmonary/Chest: Effort normal and breath sounds normal. No respiratory distress. He has no wheezes. He has no rales. He exhibits no tenderness.  Abdominal: Soft. Bowel sounds are normal. He exhibits no distension and no mass. There is no tenderness. There is no rebound and no guarding.  Musculoskeletal: Normal range of motion. He exhibits edema.  Neurological:  Sedated on vent  Skin: Skin is warm. No rash noted. No erythema.  Psychiatric: Affect and judgment normal.   LABORATORY PANEL:   CBC  Recent Labs Lab 09/29/15 0444  WBC 11.4*  HGB 12.4*  HCT 40.7  PLT 141*   ------------------------------------------------------------------------------------------------------------------  Chemistries   Recent Labs Lab 09/24/15 0512  09/29/15 0444  NA 139  < > 141  K 4.1  < > 4.3  CL 96*  < > 100*  CO2 36*  < > 36*  GLUCOSE 127*  < > 125*  BUN 56*  < > 64*  CREATININE 1.61*  < > 1.31*  CALCIUM 8.3*  < > 8.5*  AST 19   --   --   ALT 10*  --   --   ALKPHOS 55  --   --   BILITOT 4.1*  --   --   < > = values in this interval not displayed. ------------------------------------------------------------------------------------------------------------------  Cardiac Enzymes  Recent Labs Lab 09/23/15 0447 09/23/15 0848  TROPONINI <0.03 <0.03   ------------------------------------------------------------------------------------------------------------------  RADIOLOGY:  Dg Ankle 2 Views Right  09/27/2015  CLINICAL DATA:  Injury.  Possible fracture. EXAM: RIGHT ANKLE - 2 VIEW COMPARISON:  None. FINDINGS: The mineralization and alignment are normal. There is no evidence of acute fracture or dislocation. The joint spaces appear adequately maintained. There are extensive soft tissue calcifications within the lower leg, suggesting underlying connective tissue disorder or venous stasis. IMPRESSION: No acute osseous findings. Electronically Signed   By: Carey BullocksWilliam  Veazey M.D.   On: 09/27/2015 17:30   Dg Foot 2 Views Right  09/27/2015  CLINICAL DATA:  Pain following trauma EXAM: RIGHT FOOT - 2 VIEW COMPARISON:  None. FINDINGS: Frontal and lateral views were obtained. There is soft tissue edema. There is no demonstrable fracture or dislocation. Joint spaces appear intact. No erosive change. IMPRESSION: Generalized soft tissue swelling. No fracture or dislocation. No appreciable arthropathy. Electronically Signed   By: Bretta BangWilliam  Woodruff III M.D.   On: 09/27/2015 17:28    ASSESSMENT AND PLAN:   40 year old male with a history of morbid obesity and tobacco abuse who presented with  lower extremity edema and found to have bilateral pneumonia.  1. Acute hypercapnic respiratory failure-: This is multifactorial and thought to be due to obesity hypoventilation syndrome, community-acquired pneumonia and pulmonary edema versus ARDS.  Vent management as per pulmonary.   Patient will need ENT for tracheostomy which is planned for  Monday. He would benefit from long-term acute care facility. However, due to insurance reasons he is not a candidate for LTAC.  2. Bilateral community-acquired pneumonia with septic shock He was on vancomycin and ceftazidime. These have now been stopped and he has been treated for the full course. Sputum culture still showing rare gram-negative cocci bacilli. Blood cultures are negative to date. He is off of pressors.   3.New onset systolic congestive heart failure,  EF 45%  - has elevated BNP, edema and vascular congestion on chest x-ray  Stopped IV Lasix due to worsening renal function  Monitor input and output along with daily weight. As per nephrology consultation if further diuresis is required consider. Lasix drip as he is unable to handle large Lasix boluses at this time.   4. Acute renal failure - Due to ATN and diuresis Appreciate nephrology help Creatinine has improved.   5. Cutaneous candidiasis nystatin powder  6. Morbid obesity will need bariatric referral as outpatient  7. Thrombocytopenia - this is due to acute illness. Platelet count has improved. negative hepatitis panel, negative HIV.  8.Hyperbilirubinemia could be passive congestion from congestive heart failure. hepatitis panel and ultrasound of right upper quadrant suggestive of cirrhotic changes. US showed Cirrhosis 9. Sinus tachycardia: Discussed with Dr.Kasa. Continue to monitor. Continue metoprolol.   * DVT prophylaxis with SCDs.   CODE STATUS: full.  Critical care TOTAL TIME TAKING CARE OF THIS PATIENT: 30 critical care minutes.   Patient would benefit from LTAC placement. Perian Tedder M.D on 09/29/2015   Between 7am to 6pm - Pager - [phone]  After 6pm go to [email or web address] - password [email or web address] [facility]  [place] [facility]  Office  [phone]  CC: Primary care physician; No PCP Per Patient  Note: This dictation was prepared with Dragon dictation along with smaller phrase technology.  Any transcriptional errors that result from this process are unintentional.

## 2015-09-29 NOTE — Progress Notes (Signed)
Spoke with Dr Belia HemanKasa. Patient has eyes open and moving right upper arm. Patient has not moved other extremities. At this point MD not concerned and will re-evaluate in 24 hrs. If needed prn orders to keep patient comfortable.  Fluids started. Increased tube feeds.

## 2015-09-29 NOTE — Progress Notes (Signed)
PHARMACY - CRITICAL CARE PROGRESS NOTE  Pharmacy Consult for Constipation Prevention.  Indication: ICU Status   No Known Allergies  Patient Measurements: Height: 5\' 9"  (175.3 cm) Weight: (!) 613 lb 8.6 oz (278.3 kg) IBW/kg (Calculated) : 70.7   Vital Signs: Temp: 99 F (37.2 C) (01/11 1954) Temp Source: Axillary (01/11 1954) BP: 100/80 mmHg (01/11 2000) Pulse Rate: 118 (01/11 2000) Intake/Output from previous day: 01/10 0701 - 01/11 0700 In: 1955.6 [I.V.:968.3; NG/GT:787.3; IV Piggyback:200] Out: 950 [Urine:950] Intake/Output from this shift:   Vent settings for last 24 hours: Vent Mode:  [-] PRVC FiO2 (%):  [40 %-50 %] 40 % Set Rate:  [16 bmp] 16 bmp Vt Set:  [500 mL] 500 mL PEEP:  [8 cmH20] 8 cmH20  Labs:  Recent Labs  09/27/15 0338 09/28/15 0252 09/29/15 0444  WBC 9.6 9.8 11.4*  HGB 12.4* 12.1* 12.4*  HCT 40.1 40.3 40.7  PLT 98* 108* 141*  CREATININE 0.93 0.91 1.31*   Estimated Creatinine Clearance: 161.3 mL/min (by C-G formula based on Cr of 1.31).   Recent Labs  09/29/15 0752 09/29/15 1532 09/29/15 1622  GLUCAP 116* 138* 131*    Microbiology: Recent Results (from the past 720 hour(s))  Blood culture (routine x 2)     Status: None   Collection Time: 09/15/2015 11:53 PM  Result Value Ref Range Status   Specimen Description BLOOD LEFT HAND  Final   Special Requests BOTTLES DRAWN AEROBIC AND ANAEROBIC 4CC  Final   Culture NO GROWTH 5 DAYS  Final   Report Status 09/23/2015 FINAL  Final  Blood culture (routine x 2)     Status: None   Collection Time: 09/05/2015 11:53 PM  Result Value Ref Range Status   Specimen Description BLOOD LEFT ARM  Final   Special Requests BOTTLES DRAWN AEROBIC AND ANAEROBIC 5CC  Final   Culture NO GROWTH 5 DAYS  Final   Report Status 09/23/2015 FINAL  Final  Urine culture     Status: None   Collection Time: 09/18/15 12:21 AM  Result Value Ref Range Status   Specimen Description URINE, RANDOM  Final   Special Requests NONE   Final   Culture MULTIPLE SPECIES PRESENT, SUGGEST RECOLLECTION  Final   Report Status 09/19/2015 FINAL  Final  Rapid Influenza A&B Antigens ([facility] only)     Status: None   Collection Time: 09/18/15  6:26 AM  Result Value Ref Range Status   Influenza A ([facility]) NOT DETECTED  Final   Influenza B ([facility]) NOT DETECTED  Final  MRSA PCR Screening     Status: None   Collection Time: 09/20/15  6:30 AM  Result Value Ref Range Status   MRSA by PCR NEGATIVE NEGATIVE Final    Comment:        The GeneXpert MRSA Assay (FDA approved for NASAL specimens only), is one component of a comprehensive MRSA colonization surveillance program. It is not intended to diagnose MRSA infection nor to guide or monitor treatment for MRSA infections.   Culture, respiratory (NON-Expectorated)     Status: None   Collection Time: 09/22/15  3:13 PM  Result Value Ref Range Status   Specimen Description TRACHEAL ASPIRATE  Final   Special Requests NONE  Final   Gram Stain   Final    GOOD SPECIMEN - 80-90% WBCS MODERATE WBC SEEN RARE YEAST RARE GRAM NEGATIVE COCCOBACILLI    Culture LIGHT GROWTH CANDIDA TROPICALIS  Final   Report Status 09/26/2015 FINAL  Final  Urine  culture     Status: None   Collection Time: 09/22/15 11:16 PM  Result Value Ref Range Status   Specimen Description URINE, RANDOM  Final   Special Requests NONE  Final   Culture NO GROWTH 2 DAYS  Final   Report Status 09/24/2015 FINAL  Final  CULTURE, BLOOD (ROUTINE X 2) w Reflex to PCR ID Panel     Status: None   Collection Time: 09/22/15 11:44 PM  Result Value Ref Range Status   Specimen Description BLOOD RIGHT ANTECUBITAL  Final   Special Requests BOTTLES DRAWN AEROBIC AND ANAEROBIC  Final   Culture NO GROWTH 5 DAYS  Final   Report Status 09/28/2015 FINAL  Final  CULTURE, BLOOD (ROUTINE X 2) w Reflex to PCR ID Panel     Status: None   Collection Time: 09/22/15 11:56 PM  Result Value Ref Range Status   Specimen Description BLOOD LEFT HAND   Final   Special Requests BOTTLES DRAWN AEROBIC AND ANAEROBIC  Final   Culture NO GROWTH 5 DAYS  Final   Report Status 09/28/2015 FINAL  Final  Culture, respiratory (NON-Expectorated)     Status: None   Collection Time: 09/23/15 12:01 AM  Result Value Ref Range Status   Specimen Description TRACHEAL ASPIRATE  Final   Special Requests NONE  Final   Gram Stain   Final    GOOD SPECIMEN - 80-90% WBCS MODERATE WBC SEEN RARE YEAST RARE GRAM POSITIVE COCCI    Culture Consistent with normal respiratory flora.  Final   Report Status 09/25/2015 FINAL  Final    Medications:  Scheduled:  . antiseptic oral rinse  7 mL Mouth Rinse 10 times per day  . budesonide  0.25 mg Nebulization 4 times per day  . chlorhexidine gluconate  15 mL Mouth Rinse BID  . enoxaparin (LOVENOX) injection  40 mg Subcutaneous Q12H  . famotidine  20 mg Per Tube BID  . feeding supplement (PRO-STAT SUGAR FREE 64)  30 mL Oral QID  . free water  200 mL Per Tube 3 times per day  . ipratropium-albuterol  3 mL Nebulization Q6H  . metoprolol  5 mg Intravenous 4 times per day  . nystatin   Topical BID  . sodium chloride  3 mL Intravenous Q12H  . sodium chloride  3 mL Intravenous Q12H   Infusions:  . sodium chloride 50 mL/hr at 09/29/15 1043  . feeding supplement (VITAL HIGH PROTEIN) 1,000 mL (09/29/15 1043)  . fentaNYL infusion INTRAVENOUS 25 mcg/hr (09/29/15 1605)    Assessment: Pharmacy consulted for constipation management for 40 yo male ICU patient requiring mechanical ventilation.     Plan:  Patient had bowel movement on 1/11. Will continue patient on senna/docusate 1 tabs BID. Will discontinue all other bowel medications.  Pharmacy will continue to monitor and adjust per consult.    Janal Haak L 09/29/2015,9:23 PM

## 2015-09-29 NOTE — Progress Notes (Signed)
Discussed with Dr Belia HemanKasa to do wake up assessment. Stopped/Paused fentanyl and diprivan.

## 2015-09-29 NOTE — Progress Notes (Signed)
Nutrition Follow-up    INTERVENTION:   EN: with current diprivan, recommend increasing TF to rate of 40 ml/hr, decrease Prostat to QID. Continue to assess   NUTRITION DIAGNOSIS:   Inadequate oral intake related to inability to eat as evidenced by NPO status.  GOAL:   Patient will meet greater than or equal to 90% of their needs  MONITOR:    (Energy Intake, Digestive System, Pulmonary Profile, Electrolyte and Renal Profile, Anthropometrics)  REASON FOR ASSESSMENT:   Ventilator, Consult Enteral/tube feeding initiation and management  ASSESSMENT:   Pt admitted with acute hypoxic and hypercapnic resp failure with acute metabolic encephalopathy from cocaine abuse leading to CHF and encephalopathy per MD note. Pt intubated this am.  Pt remains on vent, plan for trach, diprivan being titrated down, possibly transitioning off  Diet Order:   NPO  EN: tolerating Vital High Protein at 20 ml/hr, Prostat 6 packets daily, free water 200 mL q 8 hours  Skin:  Reviewed, no issues  Last BM:  09/16/2015, constipated  Digestive System: pt continues without BM but no other signs of TF intolerance, bowel regimen being adjusted  Electrolyte and Renal Profile:  Recent Labs Lab 09/25/15 0510  09/27/15 0338 09/28/15 0252 09/29/15 0444  BUN 50*  < > 52* 49* 64*  CREATININE 1.16  < > 0.93 0.91 1.31*  NA 141  < > 144 142 141  K 3.9  < > 4.0 4.1 4.3  PHOS 2.7  --   --   --   --   < > = values in this interval not displayed. Glucose Profile:  Recent Labs  09/28/15 1556 09/28/15 2353 09/29/15 0752  GLUCAP 144* 130* 116*   Meds: diprivan (528 kcals in 24 hours at current rate), NS at 50 ml/hr, erythromycin, lactulose, senokot, miralax  Height:   Ht Readings from Last 1 Encounters:  09/20/15 5\' 9"  (1.753 m)    Weight:   Wt Readings from Last 1 Encounters:  09/29/15 613 lb 8.6 oz (278.3 kg)   BMI:  Body mass index is 90.56 kg/(m^2).  Estimated Nutritional Needs:   Kcal:   1600-1817kcals (22-25kcals/kg) using  IBW of 72.7kg  Protein:  145-181g protein (2.0-2.5g/kg) using IBW of 72.7kg  Fluid:  2181-257445mL of fluid (30-3335mL/kg)  EDUCATION NEEDS:   No education needs identified at this time  HIGH Care Level  Romelle Starcherate Annel Zunker MS, RD, LDN [phone] Pager  [phone] Weekend/On-Call Pager

## 2015-09-29 NOTE — Progress Notes (Signed)
PULMONARY/CCM PROGRESS NOTE  Active problems: Acute ventilator dependent hypercapneic respiratory failure-failed extubation x 1 with morbid obesity Vasopressor dependent hypotension AKI - baseline Cr 1.06, continue to monitor.  Morbid obesity Smoker Cor pulmonale Hyperbilirubinemia - likely cholestasis Thrombocytopenia - appears chronic  Lines, Tubes, etc: ETT 01/02 >> . Patient self extubated, subsequently reintubated 09/25/2015 L IJ CVL 01/02 >>   Microbiology: Blood 12/30 >>  MRSA PCR 01/02 >> NEG Urine 01/04 >> NEG Resp 01/04 >> rare Gram neg coccobacilli >>  Resp 01/05 >> rare GPCs >>  Blood 01/04 >>    PCT 01/04: 0.74  Antibiotics:  Levofloxacin 12/30 >> 01/01 Ceftriaxone 01/02 >> 01/02 Azithro 01/02 >> 01/02 Levofloxacin 01/04 >> 01/05 Vanc 01/05 >> 1/9 Ceftaz 01/05 >>1/9   PCT 01/04: 0.74, 0.95, 0.77  Studies/Events: 12/31 TTE:  poor quality study, LVEF 45% 12/31 RUQ: Nodular contour the liver raising the question of cirrhosis. Gallbladder wall thickening, nonspecific in appearance 01/04 night - fever, hypotension. Cultures obtained. Abx expanded  Consultants:  Renal 01/05: no acute indication for HD   Best Practice: DVT: enoxaparin SUP: enteral famotidine Nutrition: TFs Glycemic control: N/I Sedation/analgesia: fent/dexmedetomidine infusion. PRN lorazepam    Subj: Patient remains intubated,sedated Will need trach for survival -ENT consulted -hr elevated, bp low   Obj: Filed Vitals:   09/29/15 0600 09/29/15 0700 09/29/15 0800 09/29/15 0836  BP: 94/57 97/55 97/49    Pulse: 133 126 128   Temp:  99.3 F (37.4 C)    TempSrc:      Resp: 27 20 20    Height:      Weight:      SpO2: 94% 95% 95% 93%    Gen: morbidly obese HEENT:  WNL Neck: CVL site clean, JVP cannot be assessed Chest: clear today somewhat prolonged exp time Cardiac: distant HS, regular, no M noted Abd: obese, soft, + BS Ext: symmetric edema, chronic stasis  changes Neuro: GCS<8T  BMET    Component Value Date/Time   NA 141 09/29/2015 0444   K 4.3 09/29/2015 0444   CL 100* 09/29/2015 0444   CO2 36* 09/29/2015 0444   GLUCOSE 125* 09/29/2015 0444   BUN 64* 09/29/2015 0444   CREATININE 1.31* 09/29/2015 0444   CALCIUM 8.5* 09/29/2015 0444   GFRNONAA >60 09/29/2015 0444   GFRAA >60 09/29/2015 0444    CBC    Component Value Date/Time   WBC 11.4* 09/29/2015 0444   RBC 4.87 09/29/2015 0444   HGB 12.4* 09/29/2015 0444   HCT 40.7 09/29/2015 0444   PLT 141* 09/29/2015 0444   MCV 83.6 09/29/2015 0444   MCH 25.4* 09/29/2015 0444   MCHC 30.4* 09/29/2015 0444   RDW 20.2* 09/29/2015 0444   LYMPHSABS 2.1 09/24/2015 0512   MONOABS 1.5* 09/24/2015 0512   EOSABS 0.3 09/24/2015 0512   BASOSABS 0.0 09/24/2015 0512    CXR:  New linear atelectasis in the right lung apex. Persistent bibasilar atelectasis and effusions   IMPRESSION: 1) Acute on chronic hypercarbic respiratory failure - multifactorial 2) Obesity hypoventilation syndrome 3) Atelectasis 4) Pulm edema vs ALI/ARDS 5) Fever, hypotension, suspect severe sepsis 01/05 - likely due to PNA 6) Hypotension resolved 7) AKI, non-oliguric - Cr improving 01/06 8) Mild hyperglycemia without prior dx of DM 9) Elevated bilirubin - likely cholestasis. Rest of LFTs normal. RUQ [place] unrevealing 10) Thrombocytopenia, unknown chronicity - stable 11) ICU associated discomfort 12)low BP -?volume depleted  PLAN/RECS:  Cont vent support - settings reviewed and/or adjusted Plan for Trach-ENT consulted-trach to  be placed at some point Cont vent bundle Follow CXR intermittently Daily SBT if/when meets criteria MAP goal > 65 mmHg Monitor BMET intermittently Monitor I/Os Correct electrolytes as indicated Cont TFs Bowel regimen initiated 01/06--appears at goal, will continue.  Monitor glucose. Consider SSI for glu > 180 -start IVF's at 50 cc/hr, will change propofol infusion to PRN ativan   The  Patient requires high complexity decision making for assessment and support, frequent evaluation and titration of therapies, application of advanced monitoring technologies and extensive interpretation of multiple databases. Critical Care Time devoted to patient care services described in this note is 35 minutes.   Overall, patient is critically ill, prognosis is guarded.   Lucie LeatherKurian David Dylan Monforte, M.D.  [facility]  Medical Director [facility] Medical Director [facility]

## 2015-09-30 ENCOUNTER — Encounter: Payer: Self-pay | Admitting: Physician Assistant

## 2015-09-30 DIAGNOSIS — J9690 Respiratory failure, unspecified, unspecified whether with hypoxia or hypercapnia: Secondary | ICD-10-CM | POA: Insufficient documentation

## 2015-09-30 DIAGNOSIS — I4891 Unspecified atrial fibrillation: Secondary | ICD-10-CM | POA: Insufficient documentation

## 2015-09-30 DIAGNOSIS — J189 Pneumonia, unspecified organism: Secondary | ICD-10-CM | POA: Insufficient documentation

## 2015-09-30 DIAGNOSIS — I951 Orthostatic hypotension: Secondary | ICD-10-CM | POA: Insufficient documentation

## 2015-09-30 LAB — CBC
HCT: 38.7 % — ABNORMAL LOW (ref 40.0–52.0)
Hemoglobin: 11.8 g/dL — ABNORMAL LOW (ref 13.0–18.0)
MCH: 25 pg — AB (ref 26.0–34.0)
MCHC: 30.5 g/dL — AB (ref 32.0–36.0)
MCV: 81.8 fL (ref 80.0–100.0)
PLATELETS: 160 10*3/uL (ref 150–440)
RBC: 4.72 MIL/uL (ref 4.40–5.90)
RDW: 20.2 % — ABNORMAL HIGH (ref 11.5–14.5)
WBC: 12.2 10*3/uL — ABNORMAL HIGH (ref 3.8–10.6)

## 2015-09-30 LAB — GLUCOSE, CAPILLARY
GLUCOSE-CAPILLARY: 106 mg/dL — AB (ref 65–99)
GLUCOSE-CAPILLARY: 122 mg/dL — AB (ref 65–99)
Glucose-Capillary: 121 mg/dL — ABNORMAL HIGH (ref 65–99)
Glucose-Capillary: 107 mg/dL — ABNORMAL HIGH (ref 65–99)

## 2015-09-30 LAB — BASIC METABOLIC PANEL
Anion gap: 5 (ref 5–15)
BUN: 65 mg/dL — AB (ref 6–20)
CO2: 35 mmol/L — ABNORMAL HIGH (ref 22–32)
CREATININE: 1.22 mg/dL (ref 0.61–1.24)
Calcium: 8.7 mg/dL — ABNORMAL LOW (ref 8.9–10.3)
Chloride: 102 mmol/L (ref 101–111)
Glucose, Bld: 112 mg/dL — ABNORMAL HIGH (ref 65–99)
Potassium: 3.9 mmol/L (ref 3.5–5.1)
SODIUM: 142 mmol/L (ref 135–145)

## 2015-09-30 MED ORDER — AMIODARONE IV BOLUS ONLY 150 MG/100ML
150.0000 mg | Freq: Once | INTRAVENOUS | Status: DC
  Filled 2015-09-30: qty 100

## 2015-09-30 MED ORDER — AMIODARONE IV BOLUS ONLY 150 MG/100ML
150.0000 mg | Freq: Once | INTRAVENOUS | Status: AC
  Administered 2015-09-30: 150 mg via INTRAVENOUS

## 2015-09-30 MED ORDER — AMIODARONE IV BOLUS ONLY 150 MG/100ML
150.0000 mg | Freq: Once | INTRAVENOUS | Status: AC
  Administered 2015-09-30: 150 mg via INTRAVENOUS
  Filled 2015-09-30: qty 100

## 2015-09-30 MED ORDER — AMIODARONE HCL IN DEXTROSE 360-4.14 MG/200ML-% IV SOLN
60.0000 mg/h | INTRAVENOUS | Status: AC
  Administered 2015-09-30: 60 mg/h via INTRAVENOUS
  Filled 2015-09-30 (×2): qty 200

## 2015-09-30 MED ORDER — PRO-STAT SUGAR FREE PO LIQD
30.0000 mL | Freq: Three times a day (TID) | ORAL | Status: DC
  Administered 2015-09-30 – 2015-10-03 (×11): 30 mL via ORAL

## 2015-09-30 MED ORDER — AMIODARONE LOAD VIA INFUSION
150.0000 mg | Freq: Once | INTRAVENOUS | Status: DC
Start: 2015-09-30 — End: 2015-09-30
  Filled 2015-09-30: qty 83.34

## 2015-09-30 MED ORDER — VITAL HIGH PROTEIN PO LIQD
1000.0000 mL | ORAL | Status: DC
  Administered 2015-09-30 – 2015-10-02 (×4): 1000 mL

## 2015-09-30 MED ORDER — AMIODARONE HCL IN DEXTROSE 360-4.14 MG/200ML-% IV SOLN
30.0000 mg/h | INTRAVENOUS | Status: DC
  Administered 2015-09-30 – 2015-10-03 (×7): 30 mg/h via INTRAVENOUS
  Filled 2015-09-30 (×17): qty 200

## 2015-09-30 NOTE — Consult Note (Signed)
Cardiology Consultation Note  Patient ID: Mitchell Morrow, MRN: [id number], DOB/AGE: 41/09/1975 41 y.o. Admit date: 09/16/2015   Date of Consult: 09/30/2015 Primary Physician: No PCP Per Patient [facility]  Chief Complaint: SOB Reason for Consult: New onset Afib with RVR  HPI: 40 y.o. male with h/o super obesity, tobacco abuse, and prior cocaine abuse prior to his admission who presented with upper and lower extremity swelling on 08/25/2015. He subsequently was found to have acute respiratory failure with hypoxia 2/2 CAP and acute systolic CHF with EF of 45% on echo and BNP of 330. He was eventually intubated on 1/2 due to hypercapnia. He has since developed acute renal failure being followed by renal. He has been diuresed gently with IV Lasix at times, though has previously been stopped 2/2 renal failure. His weight appears to have increased by approximately 60 pounds over the past 3 days, though there have been issues with the scales given his body habitus. He has been in sinus tachycardia at times with 1st degree AV block, and narrow complex tachycardia as well. He developed Afib with RVR on 1/12 and was started on amiodarone gtt with bolus. Heart rate remains in the 130s. His renal function and BP preclude usage of other medications at this time.     Past Medical History  Diagnosis Date  . Super obesity (HCC)   . Tobacco abuse   . Chronic systolic CHF (congestive heart failure) (HCC)   . Atrial fibrillation (HCC) 09/2015      Most Recent Cardiac Studies: Echo 09/18/2015: Study Conclusions  - Procedure narrative: Transthoracic echocardiography. Image quality was suboptimal. The study was technically difficult, as a result of poor acoustic windows, poor sound wave transmission, and body habitus. - Left ventricle: The cavity size was moderately dilated. The estimated ejection fraction was 45%. Regional wall motion abnormalities cannot be excluded. The  study is not technically sufficient to allow evaluation of LV diastolic function. - Mitral valve: Calcified annulus. - Right ventricle: The cavity size was severely dilated.  Impressions:  - very suboptimal study with limited views only seen under subcostal images. Patient has probably moderate to severe LV dysfunction with four-chamber dilated . Consider cardiology consultation if patient is short of breath.   Surgical History: History reviewed. No pertinent past surgical history.   Home Meds: Prior to Admission medications   Not on File    Inpatient Medications:  . antiseptic oral rinse  7 mL Mouth Rinse 10 times per day  . budesonide  0.25 mg Nebulization 4 times per day  . chlorhexidine gluconate  15 mL Mouth Rinse BID  . enoxaparin (LOVENOX) injection  40 mg Subcutaneous Q12H  . famotidine  20 mg Per Tube BID  . feeding supplement (PRO-STAT SUGAR FREE 64)  30 mL Oral TID  . free water  200 mL Per Tube 3 times per day  . ipratropium-albuterol  3 mL Nebulization Q6H  . metoprolol  5 mg Intravenous 4 times per day  . nystatin   Topical BID  . sodium chloride  3 mL Intravenous Q12H  . sodium chloride  3 mL Intravenous Q12H   . sodium chloride 50 mL/hr at 09/30/15 0655  . amiodarone 60 mg/hr (09/30/15 1347)  . amiodarone    . feeding supplement (VITAL HIGH PROTEIN)    . fentaNYL infusion INTRAVENOUS 150 mcg/hr (09/30/15 1233)    Allergies: No Known Allergies  Social History   Social History  . Marital Status: Single  Spouse Name: N/A  . Number of Children: N/A  . Years of Education: N/A   Occupational History  . Not on file.   Social History Main Topics  . Smoking status: Current Every Day Smoker  . Smokeless tobacco: Not on file  . Alcohol Use: No  . Drug Use: Not on file  . Sexual Activity: Not on file   Other Topics Concern  . Not on file   Social History Narrative     Family History  Problem Relation Age of Onset  . Diabetes Other        Review of Systems: Review of Systems  Unable to perform ROS: intubated     Labs: No results for input(s): CKTOTAL, CKMB, TROPONINI in the last 72 hours. Lab Results  Component Value Date   WBC 12.2* 09/30/2015   HGB 11.8* 09/30/2015   HCT 38.7* 09/30/2015   MCV 81.8 09/30/2015   PLT 160 09/30/2015    Recent Labs Lab 09/24/15 0512  09/30/15 0447  NA 139  < > 142  K 4.1  < > 3.9  CL 96*  < > 102  CO2 36*  < > 35*  BUN 56*  < > 65*  CREATININE 1.61*  < > 1.22  CALCIUM 8.3*  < > 8.7*  PROT 6.6  --   --   BILITOT 4.1*  --   --   ALKPHOS 55  --   --   ALT 10*  --   --   AST 19  --   --   GLUCOSE 127*  < > 112*  < > = values in this interval not displayed. Lab Results  Component Value Date   TRIG 140 09/23/2015   No results found for: DDIMER  Radiology/Studies:  Dg Chest 1 View  09/27/2015  CLINICAL DATA:  Shortness of breath. EXAM: CHEST 1 VIEW COMPARISON:  09/26/2015. FINDINGS: Endotracheal tube, left IJ line, NG tube in stable position . The cardiomegaly with pulmonary vascular prominence and bilateral pulmonary infiltrates consistent with pulmonary edema, no significant interim change. Right mid lung and basilar atelectasis. No pleural effusion or pneumothorax. IMPRESSION: 1. Lines and tubes in stable position. 2. Cardiomegaly with pulmonary vascular prominence and persistent bilateral pulmonary infiltrates consistent pulmonary edema. No significant interim change. 3. Lung volumes with persistent basilar atelectasis . Electronically Signed   By: Maisie Fus  Register   On: 09/27/2015 08:02   Dg Chest 1 View  09/26/2015  CLINICAL DATA:  Dyspnea, morbid obesity. EXAM: CHEST 1 VIEW COMPARISON:  Chest x-rays dated 09/25/2015 and 09/24/2015. FINDINGS: Endotracheal tube appears well positioned with tip just above the level of the carina. Enteric tube passes below the diaphragm. Left IJ central line remains adequately positioned with tip projected over the upper SVC. Exam otherwise  limited, as previously described. There is grossly stable cardiomegaly. Overall cardiomediastinal silhouette appears stable in size and configuration. Central pulmonary vascular congestion and bilateral interstitial edema persists. Left lung base difficult to evaluate, suspect atelectasis and/or effusion at the left lung base. IMPRESSION: 1. Stable chest x-ray appearance. Cardiomegaly with central pulmonary vascular congestion and bilateral interstitial edema, not significantly changed, suggesting continued mild volume overload/CHF. 2. Left lung base difficult to evaluate. Suspect atelectasis and/or small effusion at the left lung base. If febrile, left lower lobe pneumonia could not be excluded. 3.  Tubes and lines remain adequately positioned. Electronically Signed   By: Bary Richard M.D.   On: 09/26/2015 09:03   Dg Ankle 2 Views Right  09/27/2015  CLINICAL DATA:  Injury.  Possible fracture. EXAM: RIGHT ANKLE - 2 VIEW COMPARISON:  None. FINDINGS: The mineralization and alignment are normal. There is no evidence of acute fracture or dislocation. The joint spaces appear adequately maintained. There are extensive soft tissue calcifications within the lower leg, suggesting underlying connective tissue disorder or venous stasis. IMPRESSION: No acute osseous findings. Electronically Signed   By: Carey Bullocks M.D.   On: 09/27/2015 17:30   Dg Abd 1 View  09/25/2015  CLINICAL DATA:  Check gastric catheter placement EXAM: ABDOMEN - 1 VIEW COMPARISON:  09/20/2015 FINDINGS: Gastric catheter extends into the mid stomach. No other diagnostic information can be gleaned from this examination. IMPRESSION: Gastric catheter in mid stomach Electronically Signed   By: Alcide Clever M.D.   On: 09/25/2015 18:55   Dg Chest Port 1 View  09/25/2015  CLINICAL DATA:  Difficulty with intubation. Morbid obesity. History of respiratory failure. EXAM: PORTABLE CHEST 1 VIEW COMPARISON:  Chest x-rays dated 09/24/2015 and 09/23/2015.  FINDINGS: Endotracheal tube appears well positioned with tip approximately 2 cm above the carina. Left IJ central line remains adequately positioned with tip at the upper margin of the SVC. Enteric tube appears to pass below the diaphragm but its inferior portion is not clearly seen. Remainder of the exam is limited. There is grossly stable cardiomegaly. There is at least mild central pulmonary vascular congestion and bilateral interstitial edema, not significantly changed. IMPRESSION: Stable chest x-ray. Endotracheal tube appears well positioned with tip just above the level of the carina. Cardiomegaly with central pulmonary vascular congestion and bilateral interstitial edema, unchanged, suggesting continued mild volume overload/CHF. Electronically Signed   By: Bary Richard M.D.   On: 09/25/2015 16:18   Dg Chest Port 1 View  09/24/2015  CLINICAL DATA:  Respiratory failure. EXAM: PORTABLE CHEST 1 VIEW COMPARISON:  Chest x-rays from 09/10/2015 to 09/23/2015 FINDINGS: Endotracheal tube, central line, and OG tube appear unchanged. Persistent increased density at both lung bases, most likely pleural effusions and atelectasis. Fluid along the fissures in the right hemi thorax. New linear area of atelectasis in the right lung apex. Cardiomegaly with enlargement of the main pulmonary artery. IMPRESSION: New linear atelectasis in the right lung apex. Persistent bibasilar atelectasis and effusions. Electronically Signed   By: Francene Boyers M.D.   On: 09/24/2015 07:53   Dg Chest Port 1 View  09/23/2015  CLINICAL DATA:  Respiratory failure, pneumonia, CHF, acute encephalopathy, current smoker. EXAM: PORTABLE CHEST 1 VIEW COMPARISON:  Portable chest x-ray of September 21, 2014 FINDINGS: The lungs remain mildly hypoinflated. The cardiac silhouette remains enlarged. The pulmonary vascularity remains engorged. The retrocardiac region remains dense and the left hemidiaphragm is nearly totally obscured. Hazy alveolar opacity in  the right lung persists. The endotracheal tube tip lies 6 cm above the carina. The esophagogastric tube tip projects below the inferior margin of the image. The left internal jugular venous catheter tip projects over the proximal SVC. IMPRESSION: CHF with pulmonary interstitial and mild alveolar edema. Persistent left lower lobe atelectasis or pneumonia. There has not been significant interval change since the previous study. The support tubes are in reasonable position. Electronically Signed   By: David  [place] M.D.   On: 09/23/2015 07:39   Dg Chest Port 1 View  09/22/2015  CLINICAL DATA:  Respiratory failure, history of CHF, acute encephalopathy, current smoker. EXAM: PORTABLE CHEST 1 VIEW COMPARISON:  Portable chest x-ray of September 20, 2015 FINDINGS: The lungs are slightly better inflated  today. This may be at least in part due to differences in patient positioning. The pulmonary interstitial markings remain increased especially on the right. The retrocardiac region on the left remains dense. The cardiac silhouette remains enlarged. The pulmonary vascularity remains engorged. The endotracheal tube tip lies 4.4 cm above the carina. The left internal jugular venous catheter tip projects over the proximal portion of the SVC. The esophagogastric tube tip projects below the inferior margin of the image. IMPRESSION: CHF with pulmonary interstitial edema. Left lower lobe atelectasis or pneumonia. Overall there has not been significant interval change since the previous study. The support tubes are in reasonable position. Electronically Signed   By: David  [place] M.D.   On: 09/22/2015 07:54   Dg Chest Port 1 View  09/20/2015  CLINICAL DATA:  40 year old male with endotracheal tube, NG tube and central line placement. Shortness of breath. EXAM: PORTABLE CHEST 1 VIEW COMPARISON:  08/23/2015 FINDINGS: An endotracheal tube with tip 2.7 cm above the carina, left IJ central venous catheter extending over the mediastinum  but tip very difficult to identify, and NG tube entering the stomach with tip off the field of view. Cardiomegaly, bilateral airspace disease/atelectasis and pulmonary vascular congestion again noted. There is no evidence of pneumothorax. IMPRESSION: Support apparatus as described with endotracheal tube tip 2.7 cm above the carina and NG tube entering the stomach. Left IJ central venous catheter tip very difficult to identify. No evidence of pneumothorax. Cardiomegaly, bilateral airspace disease/ atelectasis in pulmonary vascular congestion again noted. Electronically Signed   By: Harmon Pier M.D.   On: 09/20/2015 07:16   Dg Chest Portable 1 View  09/11/2015  CLINICAL DATA:  40 year old male with swelling in the legs and under the arms. EXAM: PORTABLE CHEST 1 VIEW COMPARISON:  No priors. FINDINGS: Lung volumes are low. Ill-defined opacities throughout the right mid to lower lung, concerning for probable multilobar bronchopneumonia. Left lung appears clear, although assessment is severely limited by the patient's large body habitus. No definite pleural effusions. Heart size is moderately enlarged. Cephalization of the pulmonary vasculature. Upper mediastinal contours are within normal limits allowing for the lordotic positioning and patient's slight rotation to the left. IMPRESSION: 1. Findings concerning for multilobar bronchopneumonia involving the right middle and lower lobes. Followup PA and lateral chest X-ray is recommended in 3-4 weeks following trial of antibiotic therapy to ensure resolution and exclude underlying malignancy. 2. Moderate cardiomegaly with pulmonary venous congestion, but no frank pulmonary edema. Electronically Signed   By: Trudie Reed M.D.   On: 09/10/2015 22:45   Dg Abd Portable 1v  09/20/2015  CLINICAL DATA:  40 year old male with NG tube placement. Subsequent encounter. EXAM: PORTABLE ABDOMEN - 1 VIEW COMPARISON:  09/20/2015 chest x-ray. FINDINGS: Exam limited by patient's  habitus. Nasogastric tube can only be seen to the level of the gastroesophageal junction. Further evaluation limited because of habitus and motion. IMPRESSION: Nasogastric tube can only be seen to the level of the gastroesophageal junction. Further evaluation limited because of habitus and motion. Electronically Signed   By: Lacy Duverney M.D.   On: 09/20/2015 09:22   Dg Foot 2 Views Right  09/27/2015  CLINICAL DATA:  Pain following trauma EXAM: RIGHT FOOT - 2 VIEW COMPARISON:  None. FINDINGS: Frontal and lateral views were obtained. There is soft tissue edema. There is no demonstrable fracture or dislocation. Joint spaces appear intact. No erosive change. IMPRESSION: Generalized soft tissue swelling. No fracture or dislocation. No appreciable arthropathy. Electronically Signed   By:  Bretta Bang III M.D.   On: 09/27/2015 17:28   US Abdomen Limited Ruq  09/18/2015  CLINICAL DATA:  Hyperbilirubinemia.  Morbid obesity. EXAM: US ABDOMEN LIMITED - RIGHT UPPER QUADRANT COMPARISON:  None. FINDINGS: Gallbladder: Gallbladder wall is thickened, 3.6 mm. No sonographic Murphy sign. No gallbladder %%%%%% a no gallstones identified. No pericholecystic fluid. Common bile duct: Diameter: 5.0 mm Liver: Liver contour is nodular. No focal liver lesions are identified. Liver echotexture is mildly heterogeneous. Additional: Right small right pleural effusion. Study quality is degraded by patient body habitus and limited patient mobility. IMPRESSION: 1. Nodular contour the liver raising the question of cirrhosis. 2. Gallbladder wall thickening, nonspecific in appears. 3. Small right pleural effusion. Electronically Signed   By: Norva Pavlov M.D.   On: 09/18/2015 10:14    EKG: from admission on 08/24/2015: sinus tachycardia, 103 bpm, 1st degree AV block, low voltage precordial leads, nonspecific st/t changes Telemetry: Afib with RVR developing on 1/12, narrow complex tachycardia throughout admission, at times with a  1st degree AV block, other times without obvious P wave to be seen on tele strips  Weights: Filed Weights   09/27/15 0500 09/29/15 0500 09/30/15 0457  Weight: 560 lb 3 oz (254.1 kg) 613 lb 8.6 oz (278.3 kg) 622 lb 9.3 oz (282.4 kg)     Physical Exam: Blood pressure 96/71, pulse 138, temperature 99 F (37.2 C), temperature source Axillary, resp. rate 0, height 5\' 9"  (1.753 m), weight 622 lb 9.3 oz (282.4 kg), SpO2 94 %. Body mass index is 91.9 kg/(m^2). General: Intubated and sedated. Critical appearing.  Head: Normocephalic, atraumatic, sclera non-icteric, no xanthomas, nares are without discharge.  Neck: Negative for carotid bruits. JVD not elevated. Lungs: Decreased breath sounds bilaterally. Breathing is unlabored. Intubated.  Heart: Tachycardic, irregularly-irregular, with S1 S2. No murmurs, rubs, or gallops appreciated. Abdomen: Obese, soft, non-tender, non-distended with normoactive bowel sounds. No hepatomegaly. No rebound/guarding. No obvious abdominal masses. Msk:  Strength and tone appear normal for age. Extremities: No clubbing or cyanosis. 3+ pitting edema bilaterally with chronic appearance.  Neuro: Intubated and sedated. Psych:  Intubated and sedated.    Assessment and Plan:   1. Acute respiratory failure with hypercapnia: -Likely multifactorial in the setting of PNA, consider ventilator associated PNA? And acute on chronic systolic CHF -He is for trach on 1/16 -Will need significant diuresis as he is volume overloaded  2. Acute on chronic systolic CHF: -Echo upon admission with EF of 45% -Would trend EF once heart rate is controlled, to evaluate for WMA vs tachy-mediated cardiomyopathy  -Renal function has stabilized -Has been needing Lasix infusion given his soft BP, will defer to renal in this case -Continue metoprolol  3. New onset Afib: -Agree with amiodarone infusion with bolus for rate and rhythm control as other treatment options are limited at this time  given her soft BP and renal injury -Likely int he setting of patient's PNA and significant volume overload -Without significant diuresis he is unlikely convert to sinus rhythm  -Continue metoprolol -CHADSVASc at least 1 (CHF) -Risks of long term full dose anticoagulation out weigh benefit at this time  4. PNA with septic shock: -Has completed ABX -Per IM and PCCM -As above  5. Overall poor prognosis   Signed, Eula Listen, PA-C Pager: [phone] 09/30/2015, 1:57 PM

## 2015-09-30 NOTE — Progress Notes (Signed)
RT called to ICU5 by RN Dois DavenportSandra stating patient was bathed, turned, and suctioned resulting in patient sats down to 88%.  RT room and noticed patient's ETT out to 25 when it has been documented at 23 at lip all day.  RT deflated cuff and inserted ETT to 23 at lip without complication.  O2 sats immediately up to mid 90's.  Will continue to monitor.

## 2015-09-30 NOTE — Progress Notes (Signed)
Spoke with Dr Belia HemanKasa regarding patient going into Afib 130-150's. Amiodarone bolus and drip ordered.

## 2015-09-30 NOTE — Progress Notes (Signed)
[facility] [place] at [facility]   PATIENT NAME: Mitchell Morrow    MR#:  [id number]  DATE OF BIRTH:  October 30, 1975  SUBJECTIVE:  Patient is awake on ventilator.  REVIEW OF SYSTEMS:   Review of Systems  Unable to perform ROS: intubated    DRUG ALLERGIES:  No Known Allergies  VITALS:  Blood pressure 117/72, pulse 118, temperature 99 F (37.2 C), temperature source Axillary, resp. rate 8, height 5\' 9"  (1.753 m), weight 282.4 kg (622 lb 9.3 oz), SpO2 94 %.  PHYSICAL EXAMINATION:   Physical Exam  Constitutional: He is well-developed, well-nourished, and in no distress. No distress.  Morbidly obese  HENT:  Head: Normocephalic.  Eyes: No scleral icterus.  Neck: No JVD present. No tracheal deviation present.  Cardiovascular: Normal rate and regular rhythm.  Exam reveals no gallop and no friction rub.   No murmur heard. Distant heart sounds  Pulmonary/Chest: Effort normal and breath sounds normal. No respiratory distress. He has no wheezes. He has no rales. He exhibits no tenderness.  Abdominal: Soft. Bowel sounds are normal. He exhibits no distension and no mass. There is no tenderness. There is no rebound and no guarding.  Musculoskeletal: Normal range of motion. He exhibits edema.  Neurological: He is alert.  Skin: Skin is warm. No rash noted. No erythema.  Psychiatric: Affect and judgment normal.   LABORATORY PANEL:   CBC  Recent Labs Lab 09/30/15 0447  WBC 12.2*  HGB 11.8*  HCT 38.7*  PLT 160   ------------------------------------------------------------------------------------------------------------------  Chemistries   Recent Labs Lab 09/24/15 0512  09/30/15 0447  NA 139  < > 142  K 4.1  < > 3.9  CL 96*  < > 102  CO2 36*  < > 35*  GLUCOSE 127*  < > 112*  BUN 56*  < > 65*  CREATININE 1.61*  < > 1.22  CALCIUM 8.3*  < > 8.7*  AST 19  --   --   ALT 10*  --   --   ALKPHOS 55  --   --   BILITOT 4.1*  --   --   < > = values in  this interval not displayed. ------------------------------------------------------------------------------------------------------------------  Cardiac Enzymes No results for input(s): TROPONINI in the last 168 hours. ------------------------------------------------------------------------------------------------------------------  RADIOLOGY:  No results found.  ASSESSMENT AND PLAN:   40 year old male with a history of morbid obesity and tobacco abuse who presented with lower extremity edema and found to have bilateral pneumonia.  1. Acute hypercapnic respiratory failure-: This is multifactorial and thought to be due to obesity hypoventilation syndrome, community-acquired pneumonia and pulmonary edema versus ARDS.  Vent management as per pulmonary.   Patient will need ENT for tracheostomy which is planned for Monday. He would benefit from long-term acute care facility. However, due to insurance reasons he is not a candidate for LTAC.  2. Bilateral community-acquired pneumonia with septic shock He was on vancomycin and ceftazidime. These have now been stopped and he has been treated for the full course. Sputum culture still showing rare gram-negative cocci bacilli. Blood cultures are negative to date. He is off of pressors.   3.New onset systolic congestive heart failure,  EF 45%  - has elevated BNP, edema and vascular congestion on chest x-ray  Stopped IV Lasix due to worsening renal function  Monitor input and output along with daily weight. As per nephrology consultation if further diuresis is required consider. Lasix drip as he is unable to handle large  Lasix boluses at this time.   4. Acute renal failure - Due to ATN and diuresis Appreciate nephrology help Creatinine has improved.   5. Cutaneous candidiasis nystatin powder  6. Morbid obesity will need bariatric referral as outpatient  7. Thrombocytopenia - this is due to acute illness. Platelet count has improved.  negative hepatitis panel, negative HIV.  8.Hyperbilirubinemia could be passive congestion from congestive heart failure. hepatitis panel and ultrasound of right upper quadrant suggestive of cirrhotic changes. US showed Cirrhosis 9. Sinus tachycardia:Continue metoprolol.     CODE STATUS: full.  Critical care TOTAL TIME TAKING CARE OF THIS PATIENT: 30 critical care minutes.    Mitchell Morrow M.D on 09/30/2015   Between 7am to 6pm - Pager - [phone]  After 6pm go to [email or web address] - password [email or web address] [facility]  [facility]  Office  [phone]  CC: Primary care physician; No PCP Per Patient  Note: This dictation was prepared with Dragon dictation along with smaller phrase technology. Any transcriptional errors that result from this process are unintentional.

## 2015-09-30 NOTE — Progress Notes (Signed)
Patients girlfriend in patients room- patient became stimulated and agitated- Ativan given and fentanyl increased. Explained to girlfriend we did not want to stimulate patient. Per Dr Belia HemanKasa no other orders. Cardiology consult placed this am.

## 2015-09-30 NOTE — Progress Notes (Signed)
PHARMACY - CRITICAL CARE PROGRESS NOTE  Pharmacy Consult for Constipation Prevention.  Indication: ICU Status   No Known Allergies  Patient Measurements: Height: 5\' 9"  (175.3 cm) Weight: (!) 622 lb 9.3 oz (282.4 kg) IBW/kg (Calculated) : 70.7   Vital Signs: Temp: 98.7 F (37.1 C) (01/12 1515) BP: 94/69 mmHg (01/12 1600) Pulse Rate: 129 (01/12 1600) Intake/Output from previous day: 01/11 0701 - 01/12 0700 In: -  Out: 500 [Urine:500] Intake/Output from this shift:   Vent settings for last 24 hours: Vent Mode:  [-] PRVC FiO2 (%):  [40 %] 40 % Set Rate:  [16 bmp] 16 bmp Vt Set:  [500 mL] 500 mL PEEP:  [8 cmH20] 8 cmH20 Plateau Pressure:  [25 cmH20-26 cmH20] 25 cmH20  Labs:  Recent Labs  09/28/15 0252 09/29/15 0444 09/30/15 0447  WBC 9.8 11.4* 12.2*  HGB 12.1* 12.4* 11.8*  HCT 40.3 40.7 38.7*  PLT 108* 141* 160  CREATININE 0.91 1.31* 1.22   Estimated Creatinine Clearance: 175.1 mL/min (by C-G formula based on Cr of 1.22).   Recent Labs  09/30/15 0416 09/30/15 0736 09/30/15 1155  GLUCAP 106* 121* 107*    Microbiology: Recent Results (from the past 720 hour(s))  Blood culture (routine x 2)     Status: None   Collection Time: 06-14-15 11:53 PM  Result Value Ref Range Status   Specimen Description BLOOD LEFT HAND  Final   Special Requests BOTTLES DRAWN AEROBIC AND ANAEROBIC 4CC  Final   Culture NO GROWTH 5 DAYS  Final   Report Status 09/23/2015 FINAL  Final  Blood culture (routine x 2)     Status: None   Collection Time: 06-14-15 11:53 PM  Result Value Ref Range Status   Specimen Description BLOOD LEFT ARM  Final   Special Requests BOTTLES DRAWN AEROBIC AND ANAEROBIC 5CC  Final   Culture NO GROWTH 5 DAYS  Final   Report Status 09/23/2015 FINAL  Final  Urine culture     Status: None   Collection Time: 09/18/15 12:21 AM  Result Value Ref Range Status   Specimen Description URINE, RANDOM  Final   Special Requests NONE  Final   Culture MULTIPLE SPECIES  PRESENT, SUGGEST RECOLLECTION  Final   Report Status 09/19/2015 FINAL  Final  Rapid Influenza A&B Antigens ([facility] only)     Status: None   Collection Time: 09/18/15  6:26 AM  Result Value Ref Range Status   Influenza A ([facility]) NOT DETECTED  Final   Influenza B ([facility]) NOT DETECTED  Final  MRSA PCR Screening     Status: None   Collection Time: 09/20/15  6:30 AM  Result Value Ref Range Status   MRSA by PCR NEGATIVE NEGATIVE Final    Comment:        The GeneXpert MRSA Assay (FDA approved for NASAL specimens only), is one component of a comprehensive MRSA colonization surveillance program. It is not intended to diagnose MRSA infection nor to guide or monitor treatment for MRSA infections.   Culture, respiratory (NON-Expectorated)     Status: None   Collection Time: 09/22/15  3:13 PM  Result Value Ref Range Status   Specimen Description TRACHEAL ASPIRATE  Final   Special Requests NONE  Final   Gram Stain   Final    GOOD SPECIMEN - 80-90% WBCS MODERATE WBC SEEN RARE YEAST RARE GRAM NEGATIVE COCCOBACILLI    Culture LIGHT GROWTH CANDIDA TROPICALIS  Final   Report Status 09/26/2015 FINAL  Final  Urine culture  Status: None   Collection Time: 09/22/15 11:16 PM  Result Value Ref Range Status   Specimen Description URINE, RANDOM  Final   Special Requests NONE  Final   Culture NO GROWTH 2 DAYS  Final   Report Status 09/24/2015 FINAL  Final  CULTURE, BLOOD (ROUTINE X 2) w Reflex to PCR ID Panel     Status: None   Collection Time: 09/22/15 11:44 PM  Result Value Ref Range Status   Specimen Description BLOOD RIGHT ANTECUBITAL  Final   Special Requests BOTTLES DRAWN AEROBIC AND ANAEROBIC  Final   Culture NO GROWTH 5 DAYS  Final   Report Status 09/28/2015 FINAL  Final  CULTURE, BLOOD (ROUTINE X 2) w Reflex to PCR ID Panel     Status: None   Collection Time: 09/22/15 11:56 PM  Result Value Ref Range Status   Specimen Description BLOOD LEFT HAND  Final   Special Requests BOTTLES  DRAWN AEROBIC AND ANAEROBIC  Final   Culture NO GROWTH 5 DAYS  Final   Report Status 09/28/2015 FINAL  Final  Culture, respiratory (NON-Expectorated)     Status: None   Collection Time: 09/23/15 12:01 AM  Result Value Ref Range Status   Specimen Description TRACHEAL ASPIRATE  Final   Special Requests NONE  Final   Gram Stain   Final    GOOD SPECIMEN - 80-90% WBCS MODERATE WBC SEEN RARE YEAST RARE GRAM POSITIVE COCCI    Culture Consistent with normal respiratory flora.  Final   Report Status 09/25/2015 FINAL  Final    Medications:  Scheduled:  . antiseptic oral rinse  7 mL Mouth Rinse 10 times per day  . budesonide  0.25 mg Nebulization 4 times per day  . chlorhexidine gluconate  15 mL Mouth Rinse BID  . enoxaparin (LOVENOX) injection  40 mg Subcutaneous Q12H  . famotidine  20 mg Per Tube BID  . feeding supplement (PRO-STAT SUGAR FREE 64)  30 mL Oral TID  . free water  200 mL Per Tube 3 times per day  . ipratropium-albuterol  3 mL Nebulization Q6H  . metoprolol  5 mg Intravenous 4 times per day  . nystatin   Topical BID  . sodium chloride  3 mL Intravenous Q12H  . sodium chloride  3 mL Intravenous Q12H   Infusions:  . sodium chloride 50 mL/hr at 09/30/15 0655  . amiodarone 60 mg/hr (09/30/15 1347)  . amiodarone    . feeding supplement (VITAL HIGH PROTEIN)    . fentaNYL infusion INTRAVENOUS 100 mcg/hr (09/30/15 1510)    Assessment: Pharmacy consulted for constipation management for 40 yo male ICU patient requiring mechanical ventilation.     Plan:  Patient had multiple bowel movement on 1/12. Will hold senna/docusate.    Pharmacy will continue to monitor and adjust per consult.    Radonna Bracher L 09/30/2015,6:08 PM

## 2015-09-30 NOTE — Progress Notes (Signed)
Nutrition Follow-up   INTERVENTION:   EN: off diprivan, recommend increasing TF to rate of 60 ml/hr, Prostat TID. Provides 1740 kcals, 172 g of protein. Continue to assess   NUTRITION DIAGNOSIS:   Inadequate oral intake related to inability to eat as evidenced by NPO status. Being addressed via TF  GOAL:   Patient will meet greater than or equal to 90% of their needs  MONITOR:    (Energy Intake, Digestive System, Pulmonary Profile, Electrolyte and Renal Profile, Anthropometrics)  REASON FOR ASSESSMENT:   Ventilator, Consult Enteral/tube feeding initiation and management  ASSESSMENT:   Pt admitted with acute hypoxic and hypercapnic resp failure with acute metabolic encephalopathy from cocaine abuse leading to CHF and encephalopathy per MD note. Pt intubated this am.  Pt remains on vent, trach scheduled for Monday  EN: tolerating Vital High Protein at rate of 40 ml/hr, Prostat QID  Digestive System: no signs of TF intolerance, pt finally had BM, now having liquid stool  Skin:  Reviewed, no issues  Last BM:  1/12   Electrolyte and Renal Profile:  Recent Labs Lab 09/25/15 0510  09/28/15 0252 09/29/15 0444 09/30/15 0447  BUN 50*  < > 49* 64* 65*  CREATININE 1.16  < > 0.91 1.31* 1.22  NA 141  < > 142 141 142  K 3.9  < > 4.1 4.3 3.9  PHOS 2.7  --   --   --   --   < > = values in this interval not displayed. Glucose Profile:  Recent Labs  09/30/15 0416 09/30/15 0736 09/30/15 1155  GLUCAP 106* 121* 107*   Meds: NS at 50 ml/hr  Height:   Ht Readings from Last 1 Encounters:  09/20/15 5\' 9"  (1.753 m)    Weight:   Wt Readings from Last 1 Encounters:  09/30/15 622 lb 9.3 oz (282.4 kg)    BMI:  Body mass index is 91.9 kg/(m^2).  Estimated Nutritional Needs:   Kcal:  1600-1817kcals (22-25kcals/kg) using  IBW of 72.7kg  Protein:  145-181g protein (2.0-2.5g/kg) using IBW of 72.7kg  Fluid:  2181-259645mL of fluid (30-8535mL/kg)  EDUCATION NEEDS:   No  education needs identified at this time  HIGH Care Level  Romelle Starcherate London Tarnowski MS, RD, LDN [phone] Pager  [phone] Weekend/On-Call Pager

## 2015-09-30 NOTE — Progress Notes (Signed)
PULMONARY/CCM PROGRESS NOTE  Active problems: Acute ventilator dependent hypercapneic respiratory failure-failed extubation x 1 with morbid obesity Vasopressor dependent hypotension AKI - baseline Cr 1.06, continue to monitor.  Morbid obesity Smoker Cor pulmonale Hyperbilirubinemia - likely cholestasis Thrombocytopenia - appears chronic Acute afib with RVR  Lines, Tubes, etc: ETT 01/02 >> . Patient self extubated, subsequently reintubated 09/25/2015 L IJ CVL 01/02 >>   Microbiology: Blood 12/30 >>  MRSA PCR 01/02 >> NEG Urine 01/04 >> NEG Blood 01/04 >> no growth   PCT 01/04: 0.74  Antibiotics:  Levofloxacin 12/30 >> 01/01 Ceftriaxone 01/02 >> 01/02 Azithro 01/02 >> 01/02 Levofloxacin 01/04 >> 01/05 Vanc 01/05 >> 1/9 Ceftaz 01/05 >>1/9   PCT 01/04: 0.74, 0.95, 0.77  Studies/Events: 12/31 TTE:  poor quality study, LVEF 45% 12/31 RUQ: Nodular contour the liver raising the question of cirrhosis. Gallbladder wall thickening, nonspecific in appearance 01/04 night - fever, hypotension. Cultures obtained. Abx expanded -failed self extubation  Consultants:  Renal 01/05: no acute indication for HD as this time   Best Practice: DVT: enoxaparin SUP: enteral famotidine Nutrition: TFs Glycemic control: N/I Sedation/analgesia: fent/dexmedetomidine infusion. PRN lorazepam    Subj: Patient remains intubated,sedated Will need trach for survival -ENT consulted -hr elevated, bp low no with afib with RVR Started on amiodarone infusion   Obj: Filed Vitals:   09/30/15 1100 09/30/15 1200 09/30/15 1300 09/30/15 1400  BP: 119/48 92/80 96/71  93/64  Pulse: 39 149 138 141  Temp:      TempSrc:      Resp: 0 0 0 0  Height:      Weight:      SpO2: 99% 92% 94% 93%    Gen: morbidly obese HEENT:  WNL Neck: CVL site clean, JVP cannot be assessed Chest: clear today somewhat prolonged exp time Cardiac: distant HS, regular, no M noted Abd: obese, soft, + BS Ext:  symmetric edema, chronic stasis changes Neuro: GCS<8T  BMET    Component Value Date/Time   NA 142 09/30/2015 0447   K 3.9 09/30/2015 0447   CL 102 09/30/2015 0447   CO2 35* 09/30/2015 0447   GLUCOSE 112* 09/30/2015 0447   BUN 65* 09/30/2015 0447   CREATININE 1.22 09/30/2015 0447   CALCIUM 8.7* 09/30/2015 0447   GFRNONAA >60 09/30/2015 0447   GFRAA >60 09/30/2015 0447    CBC    Component Value Date/Time   WBC 12.2* 09/30/2015 0447   RBC 4.72 09/30/2015 0447   HGB 11.8* 09/30/2015 0447   HCT 38.7* 09/30/2015 0447   PLT 160 09/30/2015 0447   MCV 81.8 09/30/2015 0447   MCH 25.0* 09/30/2015 0447   MCHC 30.5* 09/30/2015 0447   RDW 20.2* 09/30/2015 0447   LYMPHSABS 2.1 09/24/2015 0512   MONOABS 1.5* 09/24/2015 0512   EOSABS 0.3 09/24/2015 0512   BASOSABS 0.0 09/24/2015 0512    CXR:  New linear atelectasis in the right lung apex. Persistent bibasilar atelectasis and effusions   IMPRESSION: 1) Acute on chronic hypercarbic respiratory failure - multifactorial 2) Obesity hypoventilation syndrome 3) Atelectasis 4) Pulm edema vs ALI/ARDS 5) Fever, hypotension, suspect severe sepsis 01/05 - likely due to PNA-resolved 6) Hypotension resolved 7) AKI, non-oliguric - Cr improving 01/06 8) Mild hyperglycemia without prior dx of DM 9) Elevated bilirubin - likely cholestasis. Rest of LFTs normal. RUQ US unrevealing 10) Thrombocytopenia, unknown chronicity - stable 11) ICU associated discomfort 12)low BP -?volume depleted 13)acute afib with RVR  PLAN/RECS:  Cont vent support - settings reviewed and/or adjusted  Plan for Trach-ENT consulted-trach to be placed at some point-possible Monday Cont vent bundle Follow CXR intermittently Daily SBT if/when meets criteria MAP goal > 65 mmHg Monitor BMET intermittently Monitor I/Os Correct electrolytes as indicated Cont TFs Bowel regimen initiated 01/06--appears at goal, will continue.  Monitor glucose. Consider SSI for glu >  180 -started IVF's at 50 cc/hr,  PRN ativan, fent infusion -start amiodarone infusion   The Patient requires high complexity decision making for assessment and support, frequent evaluation and titration of therapies, application of advanced monitoring technologies and extensive interpretation of multiple databases. Critical Care Time devoted to patient care services described in this note is 35 minutes.   Overall, patient is critically ill, prognosis is guarded.   Lucie Leather, M.D.  [facility]  Medical Director [facility] Medical Director [facility] Cardio-Pulmonary Department

## 2015-09-30 NOTE — Care Management (Signed)
Patient had sedation turned off 1/11 to assess nuero status.  He is alert and responds to verbal commands off sedation.  he does try to talk which causes him frustration.  For trach on Monday Jan 16.  Unable to determine why no one else in the practice could have performed before then.  [facility]  Might be worth while to investigate bariatric resources for patient.  Intensivist says patient should keep the trach until he loses at least 200 pounds

## 2015-09-30 NOTE — Progress Notes (Signed)
Paged Dr Alycia Rossettiyan, patient has converted to sinus tach

## 2015-10-01 DIAGNOSIS — I4891 Unspecified atrial fibrillation: Secondary | ICD-10-CM

## 2015-10-01 DIAGNOSIS — J9690 Respiratory failure, unspecified, unspecified whether with hypoxia or hypercapnia: Secondary | ICD-10-CM

## 2015-10-01 LAB — EXPECTORATED SPUTUM ASSESSMENT W REFEX TO RESP CULTURE: SPECIAL REQUESTS: NORMAL

## 2015-10-01 LAB — CBC
HEMATOCRIT: 38.7 % — AB (ref 40.0–52.0)
HEMOGLOBIN: 11.8 g/dL — AB (ref 13.0–18.0)
MCH: 25.6 pg — AB (ref 26.0–34.0)
MCHC: 30.6 g/dL — AB (ref 32.0–36.0)
MCV: 83.7 fL (ref 80.0–100.0)
Platelets: 170 10*3/uL (ref 150–440)
RBC: 4.62 MIL/uL (ref 4.40–5.90)
RDW: 19.7 % — ABNORMAL HIGH (ref 11.5–14.5)
WBC: 11.5 10*3/uL — ABNORMAL HIGH (ref 3.8–10.6)

## 2015-10-01 LAB — GLUCOSE, CAPILLARY
GLUCOSE-CAPILLARY: 113 mg/dL — AB (ref 65–99)
Glucose-Capillary: 109 mg/dL — ABNORMAL HIGH (ref 65–99)
Glucose-Capillary: 123 mg/dL — ABNORMAL HIGH (ref 65–99)
Glucose-Capillary: 125 mg/dL — ABNORMAL HIGH (ref 65–99)
Glucose-Capillary: 126 mg/dL — ABNORMAL HIGH (ref 65–99)
Glucose-Capillary: 127 mg/dL — ABNORMAL HIGH (ref 65–99)

## 2015-10-01 LAB — BASIC METABOLIC PANEL
Anion gap: 5 (ref 5–15)
BUN: 70 mg/dL — ABNORMAL HIGH (ref 6–20)
CALCIUM: 8.2 mg/dL — AB (ref 8.9–10.3)
CO2: 33 mmol/L — AB (ref 22–32)
Chloride: 99 mmol/L — ABNORMAL LOW (ref 101–111)
Creatinine, Ser: 1.6 mg/dL — ABNORMAL HIGH (ref 0.61–1.24)
GFR calc Af Amer: >60 mL/min (ref >60)
GFR, EST NON AFRICAN AMERICAN: 52 mL/min — AB (ref >60)
Glucose, Bld: 120 mg/dL — ABNORMAL HIGH (ref 65–99)
Potassium: 3.9 mmol/L (ref 3.5–5.1)
Sodium: 137 mmol/L (ref 135–145)

## 2015-10-01 LAB — CREATININE, SERUM
Creatinine, Ser: 1.57 mg/dL — ABNORMAL HIGH (ref 0.61–1.24)
GFR calc Af Amer: >60 mL/min (ref >60)
GFR, EST NON AFRICAN AMERICAN: 53 mL/min — AB (ref >60)

## 2015-10-01 LAB — EXPECTORATED SPUTUM ASSESSMENT W GRAM STAIN, RFLX TO RESP C

## 2015-10-01 MED ORDER — VANCOMYCIN HCL 10 G IV SOLR
1500.0000 mg | Freq: Once | INTRAVENOUS | Status: AC
  Administered 2015-10-01: 1500 mg via INTRAVENOUS
  Filled 2015-10-01: qty 1500

## 2015-10-01 MED ORDER — DEXTROSE 5 % IV SOLN
2.0000 g | Freq: Three times a day (TID) | INTRAVENOUS | Status: DC
  Administered 2015-10-01 – 2015-10-05 (×13): 2 g via INTRAVENOUS
  Filled 2015-10-01 (×17): qty 2

## 2015-10-01 MED ORDER — IBUPROFEN 600 MG PO TABS
600.0000 mg | ORAL_TABLET | Freq: Three times a day (TID) | ORAL | Status: DC | PRN
  Administered 2015-10-01: 600 mg via ORAL
  Filled 2015-10-01: qty 1

## 2015-10-01 MED ORDER — SENNOSIDES-DOCUSATE SODIUM 8.6-50 MG PO TABS
1.0000 | ORAL_TABLET | Freq: Every day | ORAL | Status: DC
  Administered 2015-10-01 – 2015-10-05 (×4): 1 via ORAL
  Filled 2015-10-01 (×4): qty 1

## 2015-10-01 MED ORDER — INFLUENZA VAC SPLIT QUAD 0.5 ML IM SUSY
0.5000 mL | PREFILLED_SYRINGE | INTRAMUSCULAR | Status: DC
  Filled 2015-10-01: qty 0.5

## 2015-10-01 MED ORDER — VANCOMYCIN HCL 10 G IV SOLR
1500.0000 mg | Freq: Three times a day (TID) | INTRAVENOUS | Status: DC
  Administered 2015-10-01 – 2015-10-02 (×3): 1500 mg via INTRAVENOUS
  Filled 2015-10-01 (×6): qty 1500

## 2015-10-01 NOTE — Progress Notes (Signed)
PHARMACY - CRITICAL CARE PROGRESS NOTE  Pharmacy Consult for Constipation Prevention.  Indication: ICU Status   No Known Allergies  Patient Measurements: Height: 5\' 9"  (175.3 cm) Weight: (!) 622 lb 9.3 oz (282.4 kg) IBW/kg (Calculated) : 70.7   Vital Signs: Temp: 102.1 F (38.9 C) (01/13 1400) Temp Source: Oral (01/13 1400) BP: 98/67 mmHg (01/13 1400) Pulse Rate: 112 (01/13 1400) Intake/Output from previous day: 01/12 0701 - 01/13 0700 In: 4674.3 [I.V.:3554.3; NG/GT:1120] Out: 1495 [Urine:1495] Intake/Output from this shift: Total I/O In: 500 [IV Piggyback:500] Out: 600 [Urine:600] Vent settings for last 24 hours: Vent Mode:  [-] PRVC FiO2 (%):  [40 %] 40 % Set Rate:  [16 bmp] 16 bmp Vt Set:  [500 mL] 500 mL PEEP:  [8 cmH20] 8 cmH20 Plateau Pressure:  [30 cmH20-31 cmH20] 30 cmH20  Labs:  Recent Labs  09/29/15 0444 09/30/15 0447 10/01/15 0426 10/01/15 1426  WBC 11.4* 12.2* 11.5*  --   HGB 12.4* 11.8* 11.8*  --   HCT 40.7 38.7* 38.7*  --   PLT 141* 160 170  --   CREATININE 1.31* 1.22 1.60* 1.57*   Estimated Creatinine Clearance: 136.1 mL/min (by C-G formula based on Cr of 1.57).   Recent Labs  10/01/15 0010 10/01/15 0736 10/01/15 1157  GLUCAP 109* 113* 123*    Microbiology: Recent Results (from the past 720 hour(s))  Blood culture (routine x 2)     Status: None   Collection Time: 09/01/2015 11:53 PM  Result Value Ref Range Status   Specimen Description BLOOD LEFT HAND  Final   Special Requests BOTTLES DRAWN AEROBIC AND ANAEROBIC 4CC  Final   Culture NO GROWTH 5 DAYS  Final   Report Status 09/23/2015 FINAL  Final  Blood culture (routine x 2)     Status: None   Collection Time: 08/28/2015 11:53 PM  Result Value Ref Range Status   Specimen Description BLOOD LEFT ARM  Final   Special Requests BOTTLES DRAWN AEROBIC AND ANAEROBIC 5CC  Final   Culture NO GROWTH 5 DAYS  Final   Report Status 09/23/2015 FINAL  Final  Urine culture     Status: None   Collection Time: 09/18/15 12:21 AM  Result Value Ref Range Status   Specimen Description URINE, RANDOM  Final   Special Requests NONE  Final   Culture MULTIPLE SPECIES PRESENT, SUGGEST RECOLLECTION  Final   Report Status 09/19/2015 FINAL  Final  Rapid Influenza A&B Antigens ([facility] only)     Status: None   Collection Time: 09/18/15  6:26 AM  Result Value Ref Range Status   Influenza A ([facility]) NOT DETECTED  Final   Influenza B ([facility]) NOT DETECTED  Final  MRSA PCR Screening     Status: None   Collection Time: 09/20/15  6:30 AM  Result Value Ref Range Status   MRSA by PCR NEGATIVE NEGATIVE Final    Comment:        The GeneXpert MRSA Assay (FDA approved for NASAL specimens only), is one component of a comprehensive MRSA colonization surveillance program. It is not intended to diagnose MRSA infection nor to guide or monitor treatment for MRSA infections.   Culture, respiratory (NON-Expectorated)     Status: None   Collection Time: 09/22/15  3:13 PM  Result Value Ref Range Status   Specimen Description TRACHEAL ASPIRATE  Final   Special Requests NONE  Final   Gram Stain   Final    GOOD SPECIMEN - 80-90% WBCS MODERATE WBC SEEN  RARE YEAST RARE GRAM NEGATIVE COCCOBACILLI    Culture LIGHT GROWTH CANDIDA TROPICALIS  Final   Report Status 09/26/2015 FINAL  Final  Urine culture     Status: None   Collection Time: 09/22/15 11:16 PM  Result Value Ref Range Status   Specimen Description URINE, RANDOM  Final   Special Requests NONE  Final   Culture NO GROWTH 2 DAYS  Final   Report Status 09/24/2015 FINAL  Final  CULTURE, BLOOD (ROUTINE X 2) w Reflex to PCR ID Panel     Status: None   Collection Time: 09/22/15 11:44 PM  Result Value Ref Range Status   Specimen Description BLOOD RIGHT ANTECUBITAL  Final   Special Requests BOTTLES DRAWN AEROBIC AND ANAEROBIC  Final   Culture NO GROWTH 5 DAYS  Final   Report Status 09/28/2015 FINAL  Final  CULTURE, BLOOD (ROUTINE X 2) w Reflex to PCR  ID Panel     Status: None   Collection Time: 09/22/15 11:56 PM  Result Value Ref Range Status   Specimen Description BLOOD LEFT HAND  Final   Special Requests BOTTLES DRAWN AEROBIC AND ANAEROBIC  Final   Culture NO GROWTH 5 DAYS  Final   Report Status 09/28/2015 FINAL  Final  Culture, respiratory (NON-Expectorated)     Status: None   Collection Time: 09/23/15 12:01 AM  Result Value Ref Range Status   Specimen Description TRACHEAL ASPIRATE  Final   Special Requests NONE  Final   Gram Stain   Final    GOOD SPECIMEN - 80-90% WBCS MODERATE WBC SEEN RARE YEAST RARE GRAM POSITIVE COCCI    Culture Consistent with normal respiratory flora.  Final   Report Status 09/25/2015 FINAL  Final  Culture, expectorated sputum-assessment     Status: None   Collection Time: 10/01/15 11:51 AM  Result Value Ref Range Status   Specimen Description EXPECTORATED SPUTUM  Final   Special Requests Normal  Final   Sputum evaluation THIS SPECIMEN IS ACCEPTABLE FOR SPUTUM CULTURE  Final   Report Status 10/01/2015 FINAL  Final    Medications:  Scheduled:  . antiseptic oral rinse  7 mL Mouth Rinse 10 times per day  . budesonide  0.25 mg Nebulization 4 times per day  . ceFEPime (MAXIPIME) IV  2 g Intravenous 3 times per day  . chlorhexidine gluconate  15 mL Mouth Rinse BID  . enoxaparin (LOVENOX) injection  40 mg Subcutaneous Q12H  . famotidine  20 mg Per Tube BID  . feeding supplement (PRO-STAT SUGAR FREE 64)  30 mL Oral TID  . free water  200 mL Per Tube 3 times per day  . [START ON 10/02/2015] Influenza vac split quadrivalent PF  0.5 mL Intramuscular Tomorrow-1000  . ipratropium-albuterol  3 mL Nebulization Q6H  . metoprolol  5 mg Intravenous 4 times per day  . nystatin   Topical BID  . senna-docusate  1 tablet Oral QHS  . sodium chloride  3 mL Intravenous Q12H  . sodium chloride  3 mL Intravenous Q12H  . vancomycin  1,500 mg Intravenous Q8H   Infusions:  . sodium chloride 50 mL/hr at 09/30/15  0655  . amiodarone 30 mg/hr (10/01/15 1025)  . feeding supplement (VITAL HIGH PROTEIN) 1,000 mL (09/30/15 2119)  . fentaNYL infusion INTRAVENOUS 10 mcg/hr (09/30/15 2120)    Assessment: Pharmacy consulted for constipation management for 40 yo male ICU patient requiring mechanical ventilation.     Plan:  Patient had multiple bowel movement  on 1/12. Will resume senna/docusate 1 tab VT QHS.    Pharmacy will continue to monitor and adjust per consult.    Mitchell Morrow,Mitchell Morrow 10/01/2015,3:53 PM

## 2015-10-01 NOTE — Progress Notes (Signed)
RT called to room by RN about sats in mid 80's.  ETT out to 25 at lip, has been at 23 at lip throughout day.  RT deflated cuff and inserted into 23 at lip without complication, O2 sat up to 93%.  Will continue to monitor.

## 2015-10-01 NOTE — Progress Notes (Signed)
RT called to room again for sats in mid 80's.  ETT found to be at 25 at lip again, RT changed ETT holder to make certain tube held in position.  Patient lavaged and suctioned but sats would not rise above 90.  RT increased to 50% FIO2 and sats up to 92%.  Will continue to monitor.

## 2015-10-01 NOTE — Progress Notes (Signed)
[facility] - [place] at [facility]   PATIENT NAME: Mitchell Morrow    MR#:  [id number]  DATE OF BIRTH:  30-Sep-1975  SUBJECTIVE:  Patient apparently was agitated last night and went into atrial fibrillation. He is started on amiodarone drip.  REVIEW OF SYSTEMS:   Review of Systems  Unable to perform ROS: intubated    DRUG ALLERGIES:  No Known Allergies  VITALS:  Blood pressure 97/59, pulse 113, temperature 102.3 F (39.1 C), temperature source Oral, resp. rate 23, height 5\' 9"  (1.753 m), weight 282.4 kg (622 lb 9.3 oz), SpO2 94 %.  PHYSICAL EXAMINATION:   Physical Exam  Constitutional: He is well-developed, well-nourished, and in no distress. No distress.  Morbidly obese  HENT:  Head: Normocephalic.  Eyes: No scleral icterus.  Neck: No JVD present. No tracheal deviation present.  Cardiovascular: Normal rate and regular rhythm.  Exam reveals no gallop and no friction rub.   No murmur heard. Distant heart sounds  Pulmonary/Chest: Effort normal and breath sounds normal. No respiratory distress. He has no wheezes. He has no rales. He exhibits no tenderness.  Abdominal: Soft. Bowel sounds are normal. He exhibits no distension and no mass. There is no tenderness. There is no rebound and no guarding.  Musculoskeletal: Normal range of motion. He exhibits edema.  Neurological: He is alert.  Skin: Skin is warm. No rash noted. No erythema.  Psychiatric: Affect and judgment normal.   LABORATORY PANEL:   CBC  Recent Labs Lab 10/01/15 0426  WBC 11.5*  HGB 11.8*  HCT 38.7*  PLT 170   ------------------------------------------------------------------------------------------------------------------  Chemistries   Recent Labs Lab 10/01/15 0426  NA 137  K 3.9  CL 99*  CO2 33*  GLUCOSE 120*  BUN 70*  CREATININE 1.60*  CALCIUM 8.2*    ------------------------------------------------------------------------------------------------------------------  Cardiac Enzymes No results for input(s): TROPONINI in the last 168 hours. ------------------------------------------------------------------------------------------------------------------  RADIOLOGY:  No results found.  ASSESSMENT AND PLAN:   40 year old male with a history of morbid obesity and tobacco abuse who presented with lower extremity edema and found to have bilateral pneumonia.  1. Acute hypercapnic respiratory failure-: This is multifactorial and thought to be due to obesity hypoventilation syndrome, community-acquired pneumonia and pulmonary edema versus ARDS.  Vent management as per pulmonary.   Patient will need ENT for tracheostomy which is planned for Monday. He would benefit from long-term acute care facility. However, due to insurance reasons he is not a candidate for LTAC.  2. Bilateral community-acquired pneumonia with septic shock He was on vancomycin and ceftazidime. These have now been stopped and he has been treated for the full course. Sputum culture still showing rare gram-negative cocci bacilli. Blood cultures are negative to date. He is off of pressors.   3.New onset systolic congestive heart failure,  EF 45%  - has elevated BNP, edema and vascular congestion on chest x-ray  Stopped IV Lasix due to worsening renal function  Monitor input and output along with daily weight. As per nephrology consultation if further diuresis is required consider. Lasix drip as he is unable to handle large Lasix boluses at this time.   4. Acute renal failure - Due to ATN and diuresis Appreciate nephrology help Creatinine has improved.   5. Cutaneous candidiasis nystatin powder  6. Morbid obesity will need bariatric referral as outpatient  7. Thrombocytopenia - this is due to acute illness. Platelet count has improved. negative hepatitis panel,  negative HIV.  8.Hyperbilirubinemia could be passive congestion from  congestive heart failure. hepatitis panel and ultrasound of right upper quadrant suggestive of cirrhotic changes. US showed Cirrhosis 9. Atrial fibrillation, new onset: Continue amiodarone drip and metoprolol.    CODE STATUS: full.  Critical care TOTAL TIME TAKING CARE OF THIS PATIENT: 30 critical care minutes.    [name] M.D on 10/01/2015   Between 7am to 6pm - Pager - [phone]  After 6pm go to [email or web address] - password [email or web address] [facility]  [facility]  Office  [phone]  CC: Primary care physician; No PCP Per Patient  Note: This dictation was prepared with Dragon dictation along with smaller phrase technology. Any transcriptional errors that result from this process are unintentional.

## 2015-10-01 NOTE — Progress Notes (Signed)
PULMONARY/CCM PROGRESS NOTE  Active problems: Acute ventilator dependent hypercapneic respiratory failure-failed extubation x 1 with morbid obesity Vasopressor dependent hypotension AKI - baseline Cr 1.06, continue to monitor.  Morbid obesity Smoker Cor pulmonale Hyperbilirubinemia - likely cholestasis Thrombocytopenia - appears chronic Acute afib with RVR  Lines, Tubes, etc: ETT 01/02 >> . Patient self extubated, subsequently reintubated 09/25/2015 L IJ CVL 01/02 >>   Microbiology: Blood 12/30 >>  MRSA PCR 01/02 >> NEG Urine 01/04 >> NEG Blood 01/04 >> no growth   PCT 01/04: 0.74  Antibiotics:  Levofloxacin 12/30 >> 01/01 Ceftriaxone 01/02 >> 01/02 Azithro 01/02 >> 01/02 Levofloxacin 01/04 >> 01/05 Vanc 01/05 >> 1/9 Ceftaz 01/05 >>1/9   PCT 01/04: 0.74, 0.95, 0.77  Studies/Events: 12/31 TTE:  poor quality study, LVEF 45% 12/31 RUQ: Nodular contour the liver raising the question of cirrhosis. Gallbladder wall thickening, nonspecific in appearance 01/04 night - fever, hypotension. Cultures obtained. Abx expanded -failed self extubation  Consultants:  Renal 01/05: no acute indication for HD as this time   Best Practice: DVT: enoxaparin SUP: enteral famotidine Nutrition: TFs Glycemic control: N/I Sedation/analgesia: fent/dexmedetomidine infusion. PRN lorazepam    Subj: Patient remains intubated,sedated Will need trach for survival -ENT consulted -hr elevated, bp low no with afib with RVR Started on amiodarone infusion Febrile episodes this AM-blood cultures and abx   Obj: Filed Vitals:   10/01/15 0700 10/01/15 0800 10/01/15 0802 10/01/15 0900  BP: 93/67 97/65 97/65  97/59  Pulse: 110 112 112 113  Temp:   102.3 F (39.1 C)   TempSrc:      Resp: 21 21 19 23   Height:      Weight:      SpO2: 94% 94% 95% 94%    Gen: morbidly obese HEENT:  WNL Neck: CVL site clean, JVP cannot be assessed Chest: clear today somewhat prolonged exp time Cardiac:  distant HS, regular, no M noted Abd: obese, soft, + BS Ext: symmetric edema, chronic stasis changes Neuro: GCS<8T  BMET    Component Value Date/Time   NA 137 10/01/2015 0426   K 3.9 10/01/2015 0426   CL 99* 10/01/2015 0426   CO2 33* 10/01/2015 0426   GLUCOSE 120* 10/01/2015 0426   BUN 70* 10/01/2015 0426   CREATININE 1.60* 10/01/2015 0426   CALCIUM 8.2* 10/01/2015 0426   GFRNONAA 52* 10/01/2015 0426   GFRAA >60 10/01/2015 0426    CBC    Component Value Date/Time   WBC 11.5* 10/01/2015 0426   RBC 4.62 10/01/2015 0426   HGB 11.8* 10/01/2015 0426   HCT 38.7* 10/01/2015 0426   PLT 170 10/01/2015 0426   MCV 83.7 10/01/2015 0426   MCH 25.6* 10/01/2015 0426   MCHC 30.6* 10/01/2015 0426   RDW 19.7* 10/01/2015 0426   LYMPHSABS 2.1 09/24/2015 0512   MONOABS 1.5* 09/24/2015 0512   EOSABS 0.3 09/24/2015 0512   BASOSABS 0.0 09/24/2015 0512    CXR:  New linear atelectasis in the right lung apex. Persistent bibasilar atelectasis and effusions   IMPRESSION: 1) Acute on chronic hypercarbic respiratory failure - multifactorial 2) Obesity hypoventilation syndrome 3) Atelectasis 4) Pulm edema vs ALI/ARDS 5) Fever, hypotension, suspect severe sepsis 01/05 - likely due to PNA-resolved 6) Hypotension resolved 7) AKI, non-oliguric - Cr improving 01/06 8) Mild hyperglycemia without prior dx of DM 9) Elevated bilirubin - likely cholestasis. Rest of LFTs normal. RUQ [place] unrevealing 10) Thrombocytopenia, unknown chronicity - stable 11) ICU associated discomfort 12)low BP -?volume depleted 13)acute afib with RVR  PLAN/RECS:  Cont vent support - settings reviewed and/or adjusted Plan for Trach-ENT consulted-trach to be placed at some point-possible Monday Cont vent bundle Follow CXR intermittently Daily SBT if/when meets criteria MAP goal > 65 mmHg Monitor BMET intermittently Monitor I/Os Correct electrolytes as indicated Cont TFs Bowel regimen initiated 01/06--appears at goal,  will continue.  Monitor glucose. Consider SSI for glu > 180 -started IVF's at 50 cc/hr,  PRN ativan, fent infusion -started amiodarone infusion -blood cultures and abx for febrile episodes   The Patient requires high complexity decision making for assessment and support, frequent evaluation and titration of therapies, application of advanced monitoring technologies and extensive interpretation of multiple databases. Critical Care Time devoted to patient care services described in this note is 35 minutes.   Overall, patient is critically ill, prognosis is guarded.   Lucie LeatherKurian David Akita Maxim, M.D.  [facility]  Medical Director [facility] Medical Director [facility] Cardio-Pulmonary Department

## 2015-10-01 NOTE — Progress Notes (Signed)
Per Dr. Belia HemanKasa- Do not perform sedation vacation.

## 2015-10-01 NOTE — Progress Notes (Addendum)
Nutrition Follow-up  DOCUMENTATION CODES:      INTERVENTION:   EN: recommend continuing current TF regimen of Vital High Protein at 60 ml/hr with Prostat TID, continue to assess   NUTRITION DIAGNOSIS:   Inadequate oral intake related to inability to eat as evidenced by NPO status. Being addressed via TF  GOAL:   Patient will meet greater than or equal to 90% of their needs  MONITOR:    (Energy Intake, Digestive System, Pulmonary Profile, Electrolyte and Renal Profile, Anthropometrics)  REASON FOR ASSESSMENT:   Ventilator, Consult Enteral/tube feeding initiation and management  ASSESSMENT:   Pt admitted with acute hypoxic and hypercapnic resp failure with acute metabolic encephalopathy from cocaine abuse leading to CHF and encephalopathy per MD note. Pt intubated this am.  Pt remains on vent, awaiting trach placement on Monday; agitated during the night, currently on amiodarone drip for afib  EN: tolerating Vital High Protein at rate of 60 ml/hr, Prostat  Digestive System: no signs of TF intolerance, +BM  Skin:  Reviewed, no issues  Last BM:  1/12   Electrolyte and Renal Profile:  Recent Labs Lab 09/25/15 0510  09/29/15 0444 09/30/15 0447 10/01/15 0426  BUN 50*  < > 64* 65* 70*  CREATININE 1.16  < > 1.31* 1.22 1.60*  NA 141  < > 141 142 137  K 3.9  < > 4.3 3.9 3.9  PHOS 2.7  --   --   --   --   < > = values in this interval not displayed. Glucose Profile:  Recent Labs  10/01/15 0010 10/01/15 0736 10/01/15 1157  GLUCAP 109* 113* 123*   Meds: NS at 50 ml/hr, fentanyl, ativan  Height:   Ht Readings from Last 1 Encounters:  09/20/15 5\' 9"  (1.753 m)    Weight:   Wt Readings from Last 1 Encounters:  09/30/15 622 lb 9.3 oz (282.4 kg)    BMI:  Body mass index is 91.9 kg/(m^2).  Estimated Nutritional Needs:   Kcal:  1600-1817kcals (22-25kcals/kg) using  IBW of 72.7kg  Protein:  145-181g protein (2.0-2.5g/kg) using IBW of 72.7kg  Fluid:   2181-253445mL of fluid (30-5835mL/kg)  EDUCATION NEEDS:   No education needs identified at this time  HIGH Care Level  Romelle Starcherate Genova Kiner MS, RD, LDN [phone] Pager  [phone] Weekend/On-Call Pager

## 2015-10-01 NOTE — Progress Notes (Signed)
Updated Dr. Belia HemanKasa about patient's temperature at 102.2- order to give tylenol.

## 2015-10-01 NOTE — Care Management (Signed)
Spoke with [employer] today about what agency might to be able to offer as far as private duty in the home.  If family chose to participate with care in the home, agency could provide maybe up to 16 hours of private duty.  Patient would not have to be on a ventilator to qualify but if needed, a ventilator could be managed.  If patient is able to discharge home, it is a given that he will discharge with a trach.  This care option has not been discussed with family yet.  It would take at least a week, maybe longer to get approval from medicaid. Patient has had some trouble maintaining sats today and temp

## 2015-10-01 NOTE — Progress Notes (Signed)
Patient: Mitchell Morrow / Admit Date: 09/14/2015 / Date of Encounter: 10/01/2015, 8:16 AM   Subjective: Converted to normal sinus rhythm yesterday late afternoon No events overnight, maintaining normal sinus rhythm On amiodarone infusion  Review of Systems: ROS All other systems reviewed and negative.   Objective: Telemetry: sinus tachycardia Physical Exam: Blood pressure 97/65, pulse 112, temperature 102.3 F (39.1 C), temperature source Oral, resp. rate 19, height 5\' 9"  (1.753 m), weight 622 lb 9.3 oz (282.4 kg), SpO2 95 %. Body mass index is 91.9 kg/(m^2). General: Intubated and sedated.  Head: Normocephalic, atraumatic, sclera non-icteric, no xanthomas, nares are without discharge. Neck: Negative for carotid bruits. JVD not elevated. Lungs: Decreased breath sounds bilaterally. Breathing is unlabored. Intubated. ,intubated Heart: Tachycardic, regular with S1 S2. No murmurs, rubs, or gallops appreciated. Abdomen: Obese, soft, non-tender, non-distended with normoactive bowel sounds. No hepatomegaly. No rebound/guarding. No obvious abdominal masses. Msk: Strength and tone appear normal for age. Extremities: No clubbing or cyanosis. 1+ pitting edema bilaterally with chronic appearance.  Neuro: Intubated and sedated. Psych: Intubated and sedated.  Intake/Output Summary (Last 24 hours) at 10/01/15 0816 Last data filed at 10/01/15 0600  Gross per 24 hour  Intake 4537.57 ml  Output   1495 ml  Net 3042.57 ml    Inpatient Medications:  . antiseptic oral rinse  7 mL Mouth Rinse 10 times per day  . budesonide  0.25 mg Nebulization 4 times per day  . chlorhexidine gluconate  15 mL Mouth Rinse BID  . enoxaparin (LOVENOX) injection  40 mg Subcutaneous Q12H  . famotidine  20 mg Per Tube BID  . feeding supplement (PRO-STAT SUGAR FREE 64)  30 mL Oral TID  . free water  200 mL Per Tube 3 times per day  . ipratropium-albuterol  3 mL Nebulization Q6H  . metoprolol  5 mg  Intravenous 4 times per day  . nystatin   Topical BID  . sodium chloride  3 mL Intravenous Q12H  . sodium chloride  3 mL Intravenous Q12H   Infusions:  . sodium chloride 50 mL/hr at 09/30/15 0655  . amiodarone 30 mg/hr (09/30/15 2119)  . feeding supplement (VITAL HIGH PROTEIN) 1,000 mL (09/30/15 2119)  . fentaNYL infusion INTRAVENOUS 10 mcg/hr (09/30/15 2120)    Labs:  Recent Labs  09/30/15 0447 10/01/15 0426  NA 142 137  K 3.9 3.9  CL 102 99*  CO2 35* 33*  GLUCOSE 112* 120*  BUN 65* 70*  CREATININE 1.22 1.60*  CALCIUM 8.7* 8.2*   No results for input(s): AST, ALT, ALKPHOS, BILITOT, PROT, ALBUMIN in the last 72 hours.  Recent Labs  09/30/15 0447 10/01/15 0426  WBC 12.2* 11.5*  HGB 11.8* 11.8*  HCT 38.7* 38.7*  MCV 81.8 83.7  PLT 160 170   No results for input(s): CKTOTAL, CKMB, TROPONINI in the last 72 hours. Invalid input(s): POCBNP No results for input(s): HGBA1C in the last 72 hours.   Weights: Filed Weights   09/27/15 0500 09/29/15 0500 09/30/15 0457  Weight: 560 lb 3 oz (254.1 kg) 613 lb 8.6 oz (278.3 kg) 622 lb 9.3 oz (282.4 kg)     Radiology/Studies:  Dg Chest 1 View  09/27/2015  CLINICAL DATA:  Shortness of breath. EXAM: CHEST 1 VIEW COMPARISON:  09/26/2015. FINDINGS: Endotracheal tube, left IJ line, NG tube in stable position . The cardiomegaly with pulmonary vascular prominence and bilateral pulmonary infiltrates consistent with pulmonary edema, no significant interim change. Right mid lung and basilar atelectasis. No  pleural effusion or pneumothorax. IMPRESSION: 1. Lines and tubes in stable position. 2. Cardiomegaly with pulmonary vascular prominence and persistent bilateral pulmonary infiltrates consistent pulmonary edema. No significant interim change. 3. Lung volumes with persistent basilar atelectasis . Electronically Signed   By: Maisie Fus  Register   On: 09/27/2015 08:02   Dg Chest 1 View  09/26/2015  CLINICAL DATA:  Dyspnea, morbid obesity. EXAM:  CHEST 1 VIEW COMPARISON:  Chest x-rays dated 09/25/2015 and 09/24/2015. FINDINGS: Endotracheal tube appears well positioned with tip just above the level of the carina. Enteric tube passes below the diaphragm. Left IJ central line remains adequately positioned with tip projected over the upper SVC. Exam otherwise limited, as previously described. There is grossly stable cardiomegaly. Overall cardiomediastinal silhouette appears stable in size and configuration. Central pulmonary vascular congestion and bilateral interstitial edema persists. Left lung base difficult to evaluate, suspect atelectasis and/or effusion at the left lung base. IMPRESSION: 1. Stable chest x-ray appearance. Cardiomegaly with central pulmonary vascular congestion and bilateral interstitial edema, not significantly changed, suggesting continued mild volume overload/CHF. 2. Left lung base difficult to evaluate. Suspect atelectasis and/or small effusion at the left lung base. If febrile, left lower lobe pneumonia could not be excluded. 3.  Tubes and lines remain adequately positioned. Electronically Signed   By: Bary Richard M.D.   On: 09/26/2015 09:03   Dg Ankle 2 Views Right  09/27/2015  CLINICAL DATA:  Injury.  Possible fracture. EXAM: RIGHT ANKLE - 2 VIEW COMPARISON:  None. FINDINGS: The mineralization and alignment are normal. There is no evidence of acute fracture or dislocation. The joint spaces appear adequately maintained. There are extensive soft tissue calcifications within the lower leg, suggesting underlying connective tissue disorder or venous stasis. IMPRESSION: No acute osseous findings. Electronically Signed   By: Carey Bullocks M.D.   On: 09/27/2015 17:30   Dg Abd 1 View  09/25/2015  CLINICAL DATA:  Check gastric catheter placement EXAM: ABDOMEN - 1 VIEW COMPARISON:  09/20/2015 FINDINGS: Gastric catheter extends into the mid stomach. No other diagnostic information can be gleaned from this examination. IMPRESSION: Gastric  catheter in mid stomach Electronically Signed   By: Alcide Clever M.D.   On: 09/25/2015 18:55   Dg Chest Port 1 View  09/25/2015  CLINICAL DATA:  Difficulty with intubation. Morbid obesity. History of respiratory failure. EXAM: PORTABLE CHEST 1 VIEW COMPARISON:  Chest x-rays dated 09/24/2015 and 09/23/2015. FINDINGS: Endotracheal tube appears well positioned with tip approximately 2 cm above the carina. Left IJ central line remains adequately positioned with tip at the upper margin of the SVC. Enteric tube appears to pass below the diaphragm but its inferior portion is not clearly seen. Remainder of the exam is limited. There is grossly stable cardiomegaly. There is at least mild central pulmonary vascular congestion and bilateral interstitial edema, not significantly changed. IMPRESSION: Stable chest x-ray. Endotracheal tube appears well positioned with tip just above the level of the carina. Cardiomegaly with central pulmonary vascular congestion and bilateral interstitial edema, unchanged, suggesting continued mild volume overload/CHF. Electronically Signed   By: Bary Richard M.D.   On: 09/25/2015 16:18   Dg Chest Port 1 View  09/24/2015  CLINICAL DATA:  Respiratory failure. EXAM: PORTABLE CHEST 1 VIEW COMPARISON:  Chest x-rays from 09/06/2015 to 09/23/2015 FINDINGS: Endotracheal tube, central line, and OG tube appear unchanged. Persistent increased density at both lung bases, most likely pleural effusions and atelectasis. Fluid along the fissures in the right hemi thorax. New linear area of atelectasis  in the right lung apex. Cardiomegaly with enlargement of the main pulmonary artery. IMPRESSION: New linear atelectasis in the right lung apex. Persistent bibasilar atelectasis and effusions. Electronically Signed   By: Francene BoyersJames  Maxwell M.D.   On: 09/24/2015 07:53   Dg Chest Port 1 View  09/23/2015  CLINICAL DATA:  Respiratory failure, pneumonia, CHF, acute encephalopathy, current smoker. EXAM: PORTABLE CHEST 1  VIEW COMPARISON:  Portable chest x-ray of September 21, 2014 FINDINGS: The lungs remain mildly hypoinflated. The cardiac silhouette remains enlarged. The pulmonary vascularity remains engorged. The retrocardiac region remains dense and the left hemidiaphragm is nearly totally obscured. Hazy alveolar opacity in the right lung persists. The endotracheal tube tip lies 6 cm above the carina. The esophagogastric tube tip projects below the inferior margin of the image. The left internal jugular venous catheter tip projects over the proximal SVC. IMPRESSION: CHF with pulmonary interstitial and mild alveolar edema. Persistent left lower lobe atelectasis or pneumonia. There has not been significant interval change since the previous study. The support tubes are in reasonable position. Electronically Signed   By: David  SwazilandJordan M.D.   On: 09/23/2015 07:39   Dg Chest Port 1 View  09/22/2015  CLINICAL DATA:  Respiratory failure, history of CHF, acute encephalopathy, current smoker. EXAM: PORTABLE CHEST 1 VIEW COMPARISON:  Portable chest x-ray of September 20, 2015 FINDINGS: The lungs are slightly better inflated today. This may be at least in part due to differences in patient positioning. The pulmonary interstitial markings remain increased especially on the right. The retrocardiac region on the left remains dense. The cardiac silhouette remains enlarged. The pulmonary vascularity remains engorged. The endotracheal tube tip lies 4.4 cm above the carina. The left internal jugular venous catheter tip projects over the proximal portion of the SVC. The esophagogastric tube tip projects below the inferior margin of the image. IMPRESSION: CHF with pulmonary interstitial edema. Left lower lobe atelectasis or pneumonia. Overall there has not been significant interval change since the previous study. The support tubes are in reasonable position. Electronically Signed   By: David  [place]Jordan M.D.   On: 09/22/2015 07:54   Dg Chest Port 1  View  09/20/2015  CLINICAL DATA:  40 year old male with endotracheal tube, NG tube and central line placement. Shortness of breath. EXAM: PORTABLE CHEST 1 VIEW COMPARISON:  09/12/2015 FINDINGS: An endotracheal tube with tip 2.7 cm above the carina, left IJ central venous catheter extending over the mediastinum but tip very difficult to identify, and NG tube entering the stomach with tip off the field of view. Cardiomegaly, bilateral airspace disease/atelectasis and pulmonary vascular congestion again noted. There is no evidence of pneumothorax. IMPRESSION: Support apparatus as described with endotracheal tube tip 2.7 cm above the carina and NG tube entering the stomach. Left IJ central venous catheter tip very difficult to identify. No evidence of pneumothorax. Cardiomegaly, bilateral airspace disease/ atelectasis in pulmonary vascular congestion again noted. Electronically Signed   By: Harmon PierJeffrey  Hu M.D.   On: 09/20/2015 07:16   Dg Chest Portable 1 View  08/27/2015  CLINICAL DATA:  40 year old male with swelling in the legs and under the arms. EXAM: PORTABLE CHEST 1 VIEW COMPARISON:  No priors. FINDINGS: Lung volumes are low. Ill-defined opacities throughout the right mid to lower lung, concerning for probable multilobar bronchopneumonia. Left lung appears clear, although assessment is severely limited by the patient's large body habitus. No definite pleural effusions. Heart size is moderately enlarged. Cephalization of the pulmonary vasculature. Upper mediastinal contours are within normal  limits allowing for the lordotic positioning and patient's slight rotation to the left. IMPRESSION: 1. Findings concerning for multilobar bronchopneumonia involving the right middle and lower lobes. Followup PA and lateral chest X-ray is recommended in 3-4 weeks following trial of antibiotic therapy to ensure resolution and exclude underlying malignancy. 2. Moderate cardiomegaly with pulmonary venous congestion, but no frank  pulmonary edema. Electronically Signed   By: Trudie Reed M.D.   On: 09/14/2015 22:45   Dg Abd Portable 1v  09/20/2015  CLINICAL DATA:  40 year old male with NG tube placement. Subsequent encounter. EXAM: PORTABLE ABDOMEN - 1 VIEW COMPARISON:  09/20/2015 chest x-ray. FINDINGS: Exam limited by patient's habitus. Nasogastric tube can only be seen to the level of the gastroesophageal junction. Further evaluation limited because of habitus and motion. IMPRESSION: Nasogastric tube can only be seen to the level of the gastroesophageal junction. Further evaluation limited because of habitus and motion. Electronically Signed   By: Lacy Duverney M.D.   On: 09/20/2015 09:22   Dg Foot 2 Views Right  09/27/2015  CLINICAL DATA:  Pain following trauma EXAM: RIGHT FOOT - 2 VIEW COMPARISON:  None. FINDINGS: Frontal and lateral views were obtained. There is soft tissue edema. There is no demonstrable fracture or dislocation. Joint spaces appear intact. No erosive change. IMPRESSION: Generalized soft tissue swelling. No fracture or dislocation. No appreciable arthropathy. Electronically Signed   By: Bretta Bang III M.D.   On: 09/27/2015 17:28   US Abdomen Limited Ruq  09/18/2015  CLINICAL DATA:  Hyperbilirubinemia.  Morbid obesity. EXAM: US ABDOMEN LIMITED - RIGHT UPPER QUADRANT COMPARISON:  None. FINDINGS: Gallbladder: Gallbladder wall is thickened, 3.6 mm. No sonographic Murphy sign. No gallbladder %%%%%% a no gallstones identified. No pericholecystic fluid. Common bile duct: Diameter: 5.0 mm Liver: Liver contour is nodular. No focal liver lesions are identified. Liver echotexture is mildly heterogeneous. Additional: Right small right pleural effusion. Study quality is degraded by patient body habitus and limited patient mobility. IMPRESSION: 1. Nodular contour the liver raising the question of cirrhosis. 2. Gallbladder wall thickening, nonspecific in appears. 3. Small right pleural effusion. Electronically Signed    By: Norva Pavlov M.D.   On: 09/18/2015 10:14     Assessment and Plan  40 y.o. male   1. Acute respiratory failure with hypercapnia: -Likely multifactorial in the setting of PNA,  acute on chronic systolic and diastolic CHF  trach on 1/16  2. Acute on chronic systolic CHF:  EF of 45%, likely secondary to OSA/obesity hypoventilation -Has been needing Lasix infusion given his soft BP, will defer to renal in this case Rhythm control with amiodarone  3. New onset Afib: -Would continue amiodarone infusion 0.5 mg/min If he converts back to atrial fibrillation, would re-load amiodarone, continue at higher rate and effort to restore normal sinus rhythm,  -Continue metoprolol -CHADSVASc at least 1 (CHF) -Risks of long term full dose anticoagulation out weigh benefit at this time  4. PNA with septic shock: -Has completed ABX -Per IM and PCCM   Signed, Dossie Arbour [facility] 10/01/2015, 8:16 AM

## 2015-10-01 NOTE — Progress Notes (Signed)
ANTIBIOTIC CONSULT NOTE - INITIAL  Pharmacy Consult for Vancomycin/Cefepime -Day 1  Indication: rule out pneumonia  No Known Allergies  Patient Measurements: Height: 5\' 9"  (175.3 cm) Weight: (!) 622 lb 9.3 oz (282.4 kg) IBW/kg (Calculated) : 70.7 Adjusted Body Weight: 156kg    Vital Signs: Temp: 102.1 F (38.9 C) (01/13 1400) Temp Source: Oral (01/13 1400) BP: 98/67 mmHg (01/13 1400) Pulse Rate: 112 (01/13 1400) Intake/Output from previous day: 01/12 0701 - 01/13 0700 In: 4674.3 [I.V.:3554.3; NG/GT:1120] Out: 1495 [Urine:1495] Intake/Output from this shift: Total I/O In: 500 [IV Piggyback:500] Out: 600 [Urine:600]  Labs:  Recent Labs  09/29/15 0444 09/30/15 0447 10/01/15 0426 10/01/15 1426  WBC 11.4* 12.2* 11.5*  --   HGB 12.4* 11.8* 11.8*  --   PLT 141* 160 170  --   CREATININE 1.31* 1.22 1.60* 1.57*   Estimated Creatinine Clearance: 136.1 mL/min (by C-G formula based on Cr of 1.57). No results for input(s): VANCOTROUGH, VANCOPEAK, VANCORANDOM, GENTTROUGH, GENTPEAK, GENTRANDOM, TOBRATROUGH, TOBRAPEAK, TOBRARND, AMIKACINPEAK, AMIKACINTROU, AMIKACIN in the last 72 hours.   Microbiology: Recent Results (from the past 720 hour(s))  Blood culture (routine x 2)     Status: None   Collection Time: 09/06/2015 11:53 PM  Result Value Ref Range Status   Specimen Description BLOOD LEFT HAND  Final   Special Requests BOTTLES DRAWN AEROBIC AND ANAEROBIC 4CC  Final   Culture NO GROWTH 5 DAYS  Final   Report Status 09/23/2015 FINAL  Final  Blood culture (routine x 2)     Status: None   Collection Time: 08/23/2015 11:53 PM  Result Value Ref Range Status   Specimen Description BLOOD LEFT ARM  Final   Special Requests BOTTLES DRAWN AEROBIC AND ANAEROBIC 5CC  Final   Culture NO GROWTH 5 DAYS  Final   Report Status 09/23/2015 FINAL  Final  Urine culture     Status: None   Collection Time: 09/18/15 12:21 AM  Result Value Ref Range Status   Specimen Description URINE, RANDOM   Final   Special Requests NONE  Final   Culture MULTIPLE SPECIES PRESENT, SUGGEST RECOLLECTION  Final   Report Status 09/19/2015 FINAL  Final  Rapid Influenza A&B Antigens ([facility] only)     Status: None   Collection Time: 09/18/15  6:26 AM  Result Value Ref Range Status   Influenza A ([facility]) NOT DETECTED  Final   Influenza B ([facility]) NOT DETECTED  Final  MRSA PCR Screening     Status: None   Collection Time: 09/20/15  6:30 AM  Result Value Ref Range Status   MRSA by PCR NEGATIVE NEGATIVE Final    Comment:        The GeneXpert MRSA Assay (FDA approved for NASAL specimens only), is one component of a comprehensive MRSA colonization surveillance program. It is not intended to diagnose MRSA infection nor to guide or monitor treatment for MRSA infections.   Culture, respiratory (NON-Expectorated)     Status: None   Collection Time: 09/22/15  3:13 PM  Result Value Ref Range Status   Specimen Description TRACHEAL ASPIRATE  Final   Special Requests NONE  Final   Gram Stain   Final    GOOD SPECIMEN - 80-90% WBCS MODERATE WBC SEEN RARE YEAST RARE GRAM NEGATIVE COCCOBACILLI    Culture LIGHT GROWTH CANDIDA TROPICALIS  Final   Report Status 09/26/2015 FINAL  Final  Urine culture     Status: None   Collection Time: 09/22/15 11:16 PM  Result Value Ref  Range Status   Specimen Description URINE, RANDOM  Final   Special Requests NONE  Final   Culture NO GROWTH 2 DAYS  Final   Report Status 09/24/2015 FINAL  Final  CULTURE, BLOOD (ROUTINE X 2) w Reflex to PCR ID Panel     Status: None   Collection Time: 09/22/15 11:44 PM  Result Value Ref Range Status   Specimen Description BLOOD RIGHT ANTECUBITAL  Final   Special Requests BOTTLES DRAWN AEROBIC AND ANAEROBIC 5ML  Final   Culture NO GROWTH 5 DAYS  Final   Report Status 09/28/2015 FINAL  Final  CULTURE, BLOOD (ROUTINE X 2) w Reflex to PCR ID Panel     Status: None   Collection Time: 09/22/15 11:56 PM  Result Value Ref Range Status    Specimen Description BLOOD LEFT HAND  Final   Special Requests BOTTLES DRAWN AEROBIC AND ANAEROBIC 5ML  Final   Culture NO GROWTH 5 DAYS  Final   Report Status 09/28/2015 FINAL  Final  Culture, respiratory (NON-Expectorated)     Status: None   Collection Time: 09/23/15 12:01 AM  Result Value Ref Range Status   Specimen Description TRACHEAL ASPIRATE  Final   Special Requests NONE  Final   Gram Stain   Final    GOOD SPECIMEN - 80-90% WBCS MODERATE WBC SEEN RARE YEAST RARE GRAM POSITIVE COCCI    Culture Consistent with normal respiratory flora.  Final   Report Status 09/25/2015 FINAL  Final  Culture, expectorated sputum-assessment     Status: None   Collection Time: 10/01/15 11:51 AM  Result Value Ref Range Status   Specimen Description EXPECTORATED SPUTUM  Final   Special Requests Normal  Final   Sputum evaluation THIS SPECIMEN IS ACCEPTABLE FOR SPUTUM CULTURE  Final   Report Status 10/01/2015 FINAL  Final    Medical History: Past Medical History  Diagnosis Date  . Super obesity (HCC)   . Tobacco abuse   . Chronic systolic CHF (congestive heart failure) (HCC)   . Atrial fibrillation (HCC) 09/2015    Medications:  Scheduled:  . antiseptic oral rinse  7 mL Mouth Rinse 10 times per day  . budesonide  0.25 mg Nebulization 4 times per day  . ceFEPime (MAXIPIME) IV  2 g Intravenous 3 times per day  . chlorhexidine gluconate  15 mL Mouth Rinse BID  . enoxaparin (LOVENOX) injection  40 mg Subcutaneous Q12H  . famotidine  20 mg Per Tube BID  . feeding supplement (PRO-STAT SUGAR FREE 64)  30 mL Oral TID  . free water  200 mL Per Tube 3 times per day  . [START ON 10/02/2015] Influenza vac split quadrivalent PF  0.5 mL Intramuscular Tomorrow-1000  . ipratropium-albuterol  3 mL Nebulization Q6H  . metoprolol  5 mg Intravenous 4 times per day  . nystatin   Topical BID  . senna-docusate  1 tablet Oral QHS  . sodium chloride  3 mL Intravenous Q12H  . sodium chloride  3 mL Intravenous  Q12H  . vancomycin  1,500 mg Intravenous Q8H   Infusions:  . sodium chloride 50 mL/hr at 09/30/15 0655  . amiodarone 30 mg/hr (10/01/15 1025)  . feeding supplement (VITAL HIGH PROTEIN) 1,000 mL (09/30/15 2119)  . fentaNYL infusion INTRAVENOUS 10 mcg/hr (09/30/15 2120)   Assessment: Pharmacy consulted to dose vancomycin and Cefepime for 40 yo male ICU patient requiring mechanical ventilation experiencing fevers. Patient had urine, sputum, and blood cultures prior to antibiotic administration on 1/13.  Vancomycin kinetics (assumes CrCl of 122mL/min and uses ABW of 115kg)   Ke: 0.1 T1/2: 6.93hr   VD: 80.5L    Goal of Therapy:  Vancomycin trough level 15-20 mcg/ml  Plan:  Will start patient on vancomycin 1500mg  IV Q8hr for goal trough of 15-20. Will give stacked dose ~ 6 hours after initial dose. Will monitor serum creatine daily and obtain trough with 5th dose.   Will start patient on Cefepime 2g IV Q8h.   Will follow cultures daily.    Pharmacy will continue to monitor and adjust per consult.    Dezmon Conover L 10/01/2015,3:54 PM

## 2015-10-02 ENCOUNTER — Inpatient Hospital Stay: Payer: Medicaid Other

## 2015-10-02 LAB — BLOOD CULTURE ID PANEL (REFLEXED)
ACINETOBACTER BAUMANNII: NOT DETECTED
CANDIDA GLABRATA: NOT DETECTED
CANDIDA KRUSEI: NOT DETECTED
CANDIDA PARAPSILOSIS: NOT DETECTED
Candida albicans: NOT DETECTED
Candida tropicalis: NOT DETECTED
Carbapenem resistance: NOT DETECTED
ENTEROBACTER CLOACAE COMPLEX: NOT DETECTED
ENTEROCOCCUS SPECIES: NOT DETECTED
ESCHERICHIA COLI: NOT DETECTED
Enterobacteriaceae species: NOT DETECTED
Haemophilus influenzae: NOT DETECTED
KLEBSIELLA OXYTOCA: NOT DETECTED
Klebsiella pneumoniae: NOT DETECTED
LISTERIA MONOCYTOGENES: NOT DETECTED
METHICILLIN RESISTANCE: DETECTED — AB
Neisseria meningitidis: NOT DETECTED
PROTEUS SPECIES: NOT DETECTED
PSEUDOMONAS AERUGINOSA: NOT DETECTED
SERRATIA MARCESCENS: NOT DETECTED
STAPHYLOCOCCUS AUREUS BCID: NOT DETECTED
STREPTOCOCCUS PNEUMONIAE: NOT DETECTED
STREPTOCOCCUS PYOGENES: NOT DETECTED
STREPTOCOCCUS SPECIES: NOT DETECTED
Staphylococcus species: DETECTED — AB
Streptococcus agalactiae: NOT DETECTED
Vancomycin resistance: NOT DETECTED

## 2015-10-02 LAB — CBC
HEMATOCRIT: 37.3 % — AB (ref 40.0–52.0)
HEMOGLOBIN: 11.3 g/dL — AB (ref 13.0–18.0)
MCH: 25.5 pg — ABNORMAL LOW (ref 26.0–34.0)
MCHC: 30.3 g/dL — ABNORMAL LOW (ref 32.0–36.0)
MCV: 84 fL (ref 80.0–100.0)
Platelets: 154 10*3/uL (ref 150–440)
RBC: 4.44 MIL/uL (ref 4.40–5.90)
RDW: 20.4 % — AB (ref 11.5–14.5)
WBC: 12.3 10*3/uL — AB (ref 3.8–10.6)

## 2015-10-02 LAB — BLOOD GAS, ARTERIAL
ALLENS TEST (PASS/FAIL): POSITIVE — AB
Acid-Base Excess: 9.3 mmol/L — ABNORMAL HIGH (ref 0.0–3.0)
Bicarbonate: 34.2 mEq/L — ABNORMAL HIGH (ref 21.0–28.0)
FIO2: 0.55
LHR: 16 {breaths}/min
O2 Saturation: 97.7 %
PEEP: 8 cmH2O
PO2 ART: 93 mmHg (ref 83.0–108.0)
Patient temperature: 37
VT: 500 mL
pCO2 arterial: 47 mmHg (ref 32.0–48.0)
pH, Arterial: 7.47 — ABNORMAL HIGH (ref 7.350–7.450)

## 2015-10-02 LAB — BASIC METABOLIC PANEL
ANION GAP: 4 — AB (ref 5–15)
BUN: 79 mg/dL — AB (ref 6–20)
CHLORIDE: 103 mmol/L (ref 101–111)
CO2: 36 mmol/L — ABNORMAL HIGH (ref 22–32)
Calcium: 8.4 mg/dL — ABNORMAL LOW (ref 8.9–10.3)
Creatinine, Ser: 1.5 mg/dL — ABNORMAL HIGH (ref 0.61–1.24)
GFR calc Af Amer: >60 mL/min (ref >60)
GFR calc non Af Amer: 56 mL/min — ABNORMAL LOW (ref >60)
GLUCOSE: 119 mg/dL — AB (ref 65–99)
POTASSIUM: 3.9 mmol/L (ref 3.5–5.1)
SODIUM: 143 mmol/L (ref 135–145)

## 2015-10-02 LAB — GLUCOSE, CAPILLARY
GLUCOSE-CAPILLARY: 125 mg/dL — AB (ref 65–99)
Glucose-Capillary: 125 mg/dL — ABNORMAL HIGH (ref 65–99)
Glucose-Capillary: 139 mg/dL — ABNORMAL HIGH (ref 65–99)

## 2015-10-02 LAB — VANCOMYCIN, TROUGH: VANCOMYCIN TR: 38 ug/mL — AB (ref 10–20)

## 2015-10-02 NOTE — Progress Notes (Signed)
[facility] - [place] at [facility]   PATIENT NAME: Mitchell Morrow    MR#:  [id number]  DATE OF BIRTH:  1976/07/25  SUBJECTIVE:  Patient remains on ventilator. He is on amiodarone drip. He does appear patient is in atrial flutter.  REVIEW OF SYSTEMS:   Review of Systems  Unable to perform ROS: intubated    DRUG ALLERGIES:  No Known Allergies  VITALS:  Blood pressure 98/64, pulse 104, temperature 99 F (37.2 C), temperature source Oral, resp. rate 18, height 5\' 9"  (1.753 m), weight 286.129 kg (630 lb 12.8 oz), SpO2 87 %.  PHYSICAL EXAMINATION:   Physical Exam  Constitutional: He is well-developed, well-nourished, and in no distress. No distress.  Morbidly obese  HENT:  Head: Normocephalic.  Eyes: No scleral icterus.  Neck: No JVD present. No tracheal deviation present.  Cardiovascular: Normal rate.  Exam reveals no gallop and no friction rub.   No murmur heard. Irregular  Pulmonary/Chest: Effort normal and breath sounds normal. No respiratory distress. He has no wheezes. He has no rales. He exhibits no tenderness.  Abdominal: Soft. Bowel sounds are normal. He exhibits no distension and no mass. There is no tenderness. There is no rebound and no guarding.  Musculoskeletal: Normal range of motion. He exhibits edema.  Neurological: He is alert.  Skin: Skin is warm. No rash noted. No erythema.  Psychiatric: Affect and judgment normal.   LABORATORY PANEL:   CBC  Recent Labs Lab 10/02/15 0305  WBC 12.3*  HGB 11.3*  HCT 37.3*  PLT 154   ------------------------------------------------------------------------------------------------------------------  Chemistries   Recent Labs Lab 10/02/15 0305  NA 143  K 3.9  CL 103  CO2 36*  GLUCOSE 119*  BUN 79*  CREATININE 1.50*  CALCIUM 8.4*   ------------------------------------------------------------------------------------------------------------------  Cardiac Enzymes No results for  input(s): TROPONINI in the last 168 hours. ------------------------------------------------------------------------------------------------------------------  RADIOLOGY:  No results found.  ASSESSMENT AND PLAN:   40 year old male with a history of morbid obesity and tobacco abuse who presented with lower extremity edema and found to have bilateral pneumonia.  1. Acute hypercapnic respiratory failure-: This is multifactorial and thought to be due to obesity hypoventilation syndrome, community-acquired pneumonia and pulmonary edema versus ARDS.  Vent management as per pulmonary.   Patient will need ENT for tracheostomy which is planned for Monday. He would benefit from long-term acute care facility. However, due to insurance reasons he is not a candidate for LTAC.  2. Bilateral community-acquired pneumonia with septic shock He was on vancomycin and ceftazidime and he had completed course of treatment. He had a fever on 10/01/2015 and. Was started on cefepime and vancomycin. Blood cultures show contaminant.  He is off pressors.  3.New onset systolic congestive heart failure,  EF 45%  - has elevated BNP, edema and vascular congestion on chest x-ray  Stopped IV Lasix due to worsening renal function As per nephrology consultation if further diuresis is required consider. Lasix drip as he is unable to handle large Lasix boluses at this time.   4. Acute renal failure - Due to ATN and diuresis Appreciate nephrology help Creatinine has improved.   5. Cutaneous candidiasis nystatin powder  6. Morbid obesity will need bariatric referral as outpatient  7. Thrombocytopenia - this is due to acute illness. Platelet count has improved. negative hepatitis panel, negative HIV.  8.Hyperbilirubinemia could be passive congestion from congestive heart failure. hepatitis panel and ultrasound of right upper quadrant suggestive of cirrhotic changes. US showed Cirrhosis 9. Atrial  fibrillation, new  onset: Continue amiodarone drip and metoprolol. Appreciate cardiology consult    CODE STATUS: full.  Critical care TOTAL TIME TAKING CARE OF THIS PATIENT: 31 critical care minutes.    Gwendolen Hewlett M.D on 10/02/2015   Between 7am to 6pm - Pager - [phone]  After 6pm go to [email or web address] - password [email or web address] [facility]  [facility]  Office  [phone]  CC: Primary care physician; No PCP Per Patient  Note: This dictation was prepared with Dragon dictation along with smaller phrase technology. Any transcriptional errors that result from this process are unintentional.

## 2015-10-02 NOTE — Progress Notes (Addendum)
PULMONARY/CCM PROGRESS NOTE  Active problems: Acute ventilator dependent hypercapneic respiratory failure-failed extubation x 1 with morbid obesity Vasopressor dependent hypotension AKI - baseline Cr 1.06, continue to monitor.  Morbid obesity Smoker Cor pulmonale Hyperbilirubinemia - likely cholestasis Thrombocytopenia - appears chronic Acute afib with RVR  Lines, Tubes, etc: ETT 01/02 >> . Patient self extubated, subsequently reintubated 09/25/2015 L IJ CVL 01/02 >>   Microbiology: Blood 12/30 >>  MRSA PCR 01/02 >> NEG Urine 01/04 >> NEG Blood 01/04 >> no growth   PCT 01/04: 0.74  Antibiotics:  Levofloxacin 12/30 >> 01/01 Ceftriaxone 01/02 >> 01/02 Azithro 01/02 >> 01/02 Levofloxacin 01/04 >> 01/05 Vanc 01/05 >> 1/9 Ceftaz 01/05 >>1/9   PCT 01/04: 0.74, 0.95, 0.77  Studies/Events: 12/31 TTE:  poor quality study, LVEF 45% 12/31 RUQ: Nodular contour the liver raising the question of cirrhosis. Gallbladder wall thickening, nonspecific in appearance 01/04 night - fever, hypotension. Cultures obtained. Abx expanded -failed self extubation  Consultants:  Renal 01/05: no acute indication for HD as this time   Best Practice: DVT: enoxaparin SUP: enteral famotidine Nutrition: TFs Glycemic control: N/I Sedation/analgesia: fent/dexmedetomidine infusion. PRN lorazepam    Subj: Patient remains intubated,sedated Will need trach for survival -ENT consulted -hr elevated, bp low no with afib with RVR Started on amiodarone infusion Mild Febrile episode overnight  Episodes of desat and inc fio2 during the night.   Obj: Filed Vitals:   10/02/15 0500 10/02/15 0600 10/02/15 0700 10/02/15 0726  BP: 104/63 102/60 98/64   Pulse: 109 102 104   Temp:  100.2 F (37.9 C)  99 F (37.2 C)  TempSrc:    Oral  Resp: 19 20 18    Height:      Weight:    630 lb 12.8 oz (286.129 kg)  SpO2: 91% 92% 87%     Gen: morbidly obese HEENT:  WNL Neck: CVL site clean, JVP cannot  be assessed Chest: clear today somewhat prolonged exp time Cardiac: distant HS, regular, no M noted Abd: obese, soft, + BS Ext: symmetric edema, chronic stasis changes Neuro: GCS<8T  BMET    Component Value Date/Time   NA 143 10/02/2015 0305   K 3.9 10/02/2015 0305   CL 103 10/02/2015 0305   CO2 36* 10/02/2015 0305   GLUCOSE 119* 10/02/2015 0305   BUN 79* 10/02/2015 0305   CREATININE 1.50* 10/02/2015 0305   CALCIUM 8.4* 10/02/2015 0305   GFRNONAA 56* 10/02/2015 0305   GFRAA >60 10/02/2015 0305    CBC    Component Value Date/Time   WBC 12.3* 10/02/2015 0305   RBC 4.44 10/02/2015 0305   HGB 11.3* 10/02/2015 0305   HCT 37.3* 10/02/2015 0305   PLT 154 10/02/2015 0305   MCV 84.0 10/02/2015 0305   MCH 25.5* 10/02/2015 0305   MCHC 30.3* 10/02/2015 0305   RDW 20.4* 10/02/2015 0305   LYMPHSABS 2.1 09/24/2015 0512   MONOABS 1.5* 09/24/2015 0512   EOSABS 0.3 09/24/2015 0512   BASOSABS 0.0 09/24/2015 0512    CXR:  New linear atelectasis in the right lung apex. Persistent bibasilar atelectasis and effusions   IMPRESSION: 1) Acute on chronic hypercarbic respiratory failure - multifactorial 2) Obesity hypoventilation syndrome 3) Atelectasis 4) Pulm edema vs ALI/ARDS 5) Fever, hypotension, suspect severe sepsis 01/05 - likely due to PNA-resolved 6) Hypotension resolved 7) AKI, non-oliguric - Cr improving 01/06 8) Mild hyperglycemia without prior dx of DM 9) Elevated bilirubin - likely cholestasis. Rest of LFTs normal. RUQ [place] unrevealing 10) Thrombocytopenia, unknown chronicity -  stable 11) ICU associated discomfort 12)low BP -?volume depleted 13)acute afib with RVR  PLAN/RECS:  Cont vent support - settings reviewed and/or adjusted - ABG and CXR today - had episodes of desats and fio2 during the night.  Plan for Trach-ENT consulted-trach to be placed at some point-possible Monday Cont vent bundle Follow CXR intermittently Daily SBT if/when meets criteria MAP goal > 65  mmHg Monitor BMET intermittently Monitor I/Os Correct electrolytes as indicated Cont TFs Bowel regimen initiated 01/06--appears at goal, will continue.  Monitor glucose. Consider SSI for glu > 180 -started IVF's at 50 cc/hr,  PRN ativan, fent infusion -on amiodarone infusion -blood cultures and abx for febrile episodes  Addendum - CXR reviewed - advance ETT 2cm - ABG reviewed - no change in vent settings.    The Patient requires high complexity decision making for assessment and support, frequent evaluation and titration of therapies, application of advanced monitoring technologies and extensive interpretation of multiple databases. Critical Care Time devoted to patient care services described in this note is 45 minutes.   Overall, patient is critically ill, prognosis is guarded.    Stephanie AcreVishal Abdirizak Richison, MD [place] [facility] [place] [facility] Pager [phone] (please enter 7-digits) On Call Pager - [phone] (please enter 7-digits)

## 2015-10-02 NOTE — Progress Notes (Signed)
PHARMACY - CRITICAL CARE PROGRESS NOTE  Pharmacy Consult for Constipation Prevention.  Indication: ICU Status   No Known Allergies  Patient Measurements: Height: 5\' 9"  (175.3 cm) Weight: (!) 630 lb 12.8 oz (286.129 kg) IBW/kg (Calculated) : 70.7   Vital Signs: Temp: 99.4 F (37.4 C) (01/14 1209) Temp Source: Oral (01/14 1209) BP: 110/77 mmHg (01/14 1200) Pulse Rate: 102 (01/14 1209) Intake/Output from previous day: 01/13 0701 - 01/14 0700 In: 5126.4 [I.V.:1746.4; NG/GT:1780; IV Piggyback:1600] Out: 1950 [Urine:1950] Intake/Output from this shift: Total I/O In: 735 [I.V.:35; NG/GT:200; IV Piggyback:500] Out: 250 [Urine:250] Vent settings for last 24 hours: Vent Mode:  [-] PRVC FiO2 (%):  [40 %-55 %] 50 % Set Rate:  [16 bmp] 16 bmp Vt Set:  [500 mL] 500 mL PEEP:  [8 cmH20] 8 cmH20 Plateau Pressure:  [30 cmH20] 30 cmH20  Labs:  Recent Labs  09/30/15 0447 10/01/15 0426 10/01/15 1426 10/02/15 0305  WBC 12.2* 11.5*  --  12.3*  HGB 11.8* 11.8*  --  11.3*  HCT 38.7* 38.7*  --  37.3*  PLT 160 170  --  154  CREATININE 1.22 1.60* 1.57* 1.50*   Estimated Creatinine Clearance: 143.8 mL/min (by C-G formula based on Cr of 1.5).   Recent Labs  10/01/15 1956 10/01/15 2330 10/02/15 0756  GLUCAP 125* 126* 125*    Microbiology:   Medications:  Scheduled:  . antiseptic oral rinse  7 mL Mouth Rinse 10 times per day  . budesonide  0.25 mg Nebulization 4 times per day  . ceFEPime (MAXIPIME) IV  2 g Intravenous 3 times per day  . chlorhexidine gluconate  15 mL Mouth Rinse BID  . enoxaparin (LOVENOX) injection  40 mg Subcutaneous Q12H  . famotidine  20 mg Per Tube BID  . feeding supplement (PRO-STAT SUGAR FREE 64)  30 mL Oral TID  . free water  200 mL Per Tube 3 times per day  . Influenza vac split quadrivalent PF  0.5 mL Intramuscular Tomorrow-1000  . ipratropium-albuterol  3 mL Nebulization Q6H  . metoprolol  5 mg Intravenous 4 times per day  . nystatin   Topical  BID  . senna-docusate  1 tablet Oral QHS  . sodium chloride  3 mL Intravenous Q12H  . sodium chloride  3 mL Intravenous Q12H  . vancomycin  1,500 mg Intravenous Q8H   Infusions:  . sodium chloride 50 mL/hr at 10/02/15 0148  . amiodarone 30 mg/hr (10/02/15 1027)  . feeding supplement (VITAL HIGH PROTEIN) 1,000 mL (10/02/15 0216)  . fentaNYL infusion INTRAVENOUS 200 mcg/hr (10/02/15 1100)    Assessment: Pharmacy consulted for constipation management for 40 yo male ICU patient requiring mechanical ventilation.     Plan:  Patient had multiple bowel movement on 1/12. Will resume senna/docusate 1 tab VT QHS.    1/14: 0 BM documented. Continue Senna/docusate 1 tab VT QHS.  Pharmacy will continue to monitor and adjust per consult.    Analiza Cowger A 10/02/2015,2:49 PM

## 2015-10-02 NOTE — Progress Notes (Signed)
Pharmacy Antibiotic Follow-up Note  Mitchell Morrow is a 40 y.o. year-old male admitted on 2015-04-09.  The patient is currently on day 2 of Vancomycin and Cefepime for HCAP.  Assessment/Plan: After discussion with Dr. Juliene PinaMody regarding BCID results, the patient will be continued on Vancomycin and Cefepime.  MD aware of contamination and desires to defer antibiotic management to ICU physician. Patient febrile and sputum cultures pending.   Temp (24hrs), Avg:100.8 F (38.2 C), Min:99 F (37.2 C), Max:102.2 F (39 C)   Recent Labs Lab 09/28/15 0252 09/29/15 0444 09/30/15 0447 10/01/15 0426 10/02/15 0305  WBC 9.8 11.4* 12.2* 11.5* 12.3*    Recent Labs Lab 09/29/15 0444 09/30/15 0447 10/01/15 0426 10/01/15 1426 10/02/15 0305  CREATININE 1.31* 1.22 1.60* 1.57* 1.50*   Estimated Creatinine Clearance: 143.8 mL/min (by C-G formula based on Cr of 1.5).    No Known Allergies  Antimicrobials this admission: Levofloxacin 12/30 >> 01/01 Ceftriaxone 01/02 >> 01/02 Azithro 01/02 >> 01/02 Levofloxacin 01/04 >> 01/05 Vanc 01/05 >> 1/9 Ceftaz 01/05 >>1/9  Vanc 1/13>> Cefepime 1/13>>  Levels/dose changes this admission: Continue current antibiotic therapy with IV Vancomycin 1500 mg q8h and Cefepime 2 gm IV q8h   Trough today at 1730  Microbiology results: 1/13 BCx: 1/4 Staph species-consistent with contamination MEC A gene expressed 1/13 UCx: pending 1/13 Sputum: pending 1/2 MRSA PCR: negative  Thank you for allowing pharmacy to be a part of this patient's care.  Zaniya Mcaulay G PharmD 10/02/2015 10:09 AM

## 2015-10-02 NOTE — Progress Notes (Signed)
Pharmacy Antibiotic Follow-up Note  Mitchell Morrow is a 40 y.o. year-old male admitted on 09/04/2015.  The patient is currently on day 2 of Vancomycin and Cefepime for HCAP.  Assessment/Plan: Patient was prescribed vancomycin 1500 mg IV q8h. Vancomycin trough was obtained tonight prior to 5th dose and was supratherapeutic at 38 mcg/mL. Vancomycin doses administered on schedule and vancomycin trough was appropriately drawn. Patient is morbidly obese with a weight of 286 kg (BMI of 77), making vancomycin dosing challenging due to altered pharmacokinetics. Have discontinued vancomycin order for now and ordered a random level for 1/15 at 1030, which is 24 hours after previous dose.   Continue with cefepime 2 g IV q8h  Temp (24hrs), Avg:99.7 F (37.6 C), Min:98.8 F (37.1 C), Max:100.9 F (38.3 C)   Recent Labs Lab 09/28/15 0252 09/29/15 0444 09/30/15 0447 10/01/15 0426 10/02/15 0305  WBC 9.8 11.4* 12.2* 11.5* 12.3*     Recent Labs Lab 09/29/15 0444 09/30/15 0447 10/01/15 0426 10/01/15 1426 10/02/15 0305  CREATININE 1.31* 1.22 1.60* 1.57* 1.50*   Estimated Creatinine Clearance: 143.8 mL/min (by C-G formula based on Cr of 1.5).    No Known Allergies  Antimicrobials this admission: Levofloxacin 12/30 >> 01/01 Ceftriaxone 01/02 >> 01/02 Azithro 01/02 >> 01/02 Levofloxacin 01/04 >> 01/05 Vanc 01/05 >> 1/9 Ceftaz 01/05 >>1/9  Vanc 1/13>> Cefepime 1/13>>  Microbiology results: 1/13 BCx: 1/4 Staph species-consistent with contamination MEC A gene expressed 1/13 UCx: pending 1/13 Sputum: pending 1/2 MRSA PCR: negative  Thank you for allowing pharmacy to be a part of this patient's care.  Jodelle RedMary M Ocige Incwayne PharmD 10/02/2015 9:02 PM

## 2015-10-03 ENCOUNTER — Inpatient Hospital Stay: Payer: Medicaid Other

## 2015-10-03 LAB — BASIC METABOLIC PANEL
ANION GAP: 7 (ref 5–15)
BUN: 82 mg/dL — ABNORMAL HIGH (ref 6–20)
CALCIUM: 8.5 mg/dL — AB (ref 8.9–10.3)
CO2: 33 mmol/L — AB (ref 22–32)
Chloride: 104 mmol/L (ref 101–111)
Creatinine, Ser: 1.56 mg/dL — ABNORMAL HIGH (ref 0.61–1.24)
GFR, EST NON AFRICAN AMERICAN: 54 mL/min — AB (ref >60)
GLUCOSE: 129 mg/dL — AB (ref 65–99)
POTASSIUM: 4.1 mmol/L (ref 3.5–5.1)
Sodium: 144 mmol/L (ref 135–145)

## 2015-10-03 LAB — GLUCOSE, CAPILLARY
GLUCOSE-CAPILLARY: 122 mg/dL — AB (ref 65–99)
GLUCOSE-CAPILLARY: 128 mg/dL — AB (ref 65–99)
GLUCOSE-CAPILLARY: 133 mg/dL — AB (ref 65–99)

## 2015-10-03 LAB — URINE CULTURE: CULTURE: NO GROWTH

## 2015-10-03 LAB — VANCOMYCIN, RANDOM: Vancomycin Rm: 21 ug/mL

## 2015-10-03 MED ORDER — VANCOMYCIN HCL 10 G IV SOLR
1500.0000 mg | Freq: Two times a day (BID) | INTRAVENOUS | Status: AC
  Administered 2015-10-03 – 2015-10-05 (×4): 1500 mg via INTRAVENOUS
  Filled 2015-10-03 (×4): qty 1500

## 2015-10-03 NOTE — Progress Notes (Signed)
PULMONARY/CCM PROGRESS NOTE  Active problems: Acute ventilator dependent hypercapneic respiratory failure-failed extubation x 1 with morbid obesity Vasopressor dependent hypotension AKI - baseline Cr 1.06, continue to monitor.  Morbid obesity Smoker Cor pulmonale Hyperbilirubinemia - likely cholestasis Thrombocytopenia - appears chronic Acute afib with RVR  Lines, Tubes, etc: ETT 01/02 >> . Patient self extubated, subsequently reintubated 09/25/2015 L IJ CVL 01/02 >>   Microbiology: Blood 12/30 >>  MRSA PCR 01/02 >> NEG Urine 01/04 >> NEG Blood 01/04 >> no growth   PCT 01/04: 0.74  Antibiotics:  Levofloxacin 12/30 >> 01/01 Ceftriaxone 01/02 >> 01/02 Azithro 01/02 >> 01/02 Levofloxacin 01/04 >> 01/05 Vanc 01/05 >> 1/9 Ceftaz 01/05 >>1/9   PCT 01/04: 0.74, 0.95, 0.77  Studies/Events: 12/31 TTE:  poor quality study, LVEF 45% 12/31 RUQ: Nodular contour the liver raising the question of cirrhosis. Gallbladder wall thickening, nonspecific in appearance 01/04 night - fever, hypotension. Cultures obtained. Abx expanded -failed self extubation  Consultants:  Renal 01/05: no acute indication for HD as this time   Best Practice: DVT: enoxaparin SUP: enteral famotidine Nutrition: TFs Glycemic control: N/I Sedation/analgesia: fent/dexmedetomidine infusion. PRN lorazepam   Subj: Patient remains intubated,sedated, will track with eyes Will need trach for survival -ENT consulted -afib on amiodarone gtt   Obj: Filed Vitals:   10/03/15 0500 10/03/15 0600 10/03/15 0700 10/03/15 0800  BP: 104/73 92/60 96/62  95/62  Pulse: 98 91 93 95  Temp:      TempSrc:      Resp: 16 16 16 17   Height:      Weight:      SpO2: 94% 92% 91% 92%    Gen: morbidly obese HEENT:  WNL Neck: CVL site clean, JVP cannot be assessed Chest: clear today somewhat prolonged exp time Cardiac: distant HS, regular, no M noted Abd: obese, soft, + BS Ext: symmetric edema, chronic stasis  changes Neuro: GCS<10T  BMET    Component Value Date/Time   NA 144 10/03/2015 0434   K 4.1 10/03/2015 0434   CL 104 10/03/2015 0434   CO2 33* 10/03/2015 0434   GLUCOSE 129* 10/03/2015 0434   BUN 82* 10/03/2015 0434   CREATININE 1.56* 10/03/2015 0434   CALCIUM 8.5* 10/03/2015 0434   GFRNONAA 54* 10/03/2015 0434   GFRAA >60 10/03/2015 0434    CBC    Component Value Date/Time   WBC 12.3* 10/02/2015 0305   RBC 4.44 10/02/2015 0305   HGB 11.3* 10/02/2015 0305   HCT 37.3* 10/02/2015 0305   PLT 154 10/02/2015 0305   MCV 84.0 10/02/2015 0305   MCH 25.5* 10/02/2015 0305   MCHC 30.3* 10/02/2015 0305   RDW 20.4* 10/02/2015 0305   LYMPHSABS 2.1 09/24/2015 0512   MONOABS 1.5* 09/24/2015 0512   EOSABS 0.3 09/24/2015 0512   BASOSABS 0.0 09/24/2015 0512    CXR:  New linear atelectasis in the right lung apex. Persistent bibasilar atelectasis and effusions   IMPRESSION: 1) Acute on chronic hypercarbic respiratory failure - multifactorial 2) Obesity hypoventilation syndrome 3) Atelectasis 4) Pulm edema vs ALI/ARDS 5) Fever, hypotension, suspect severe sepsis 01/05 - likely due to PNA-resolved 6) Hypotension resolved 7) AKI, non-oliguric - Cr improving 01/06 8) Mild hyperglycemia without prior dx of DM 9) Elevated bilirubin - likely cholestasis. Rest of LFTs normal. RUQ [place] unrevealing 10) Thrombocytopenia, unknown chronicity - stable 11) ICU associated discomfort 12)low BP -?volume depleted 13)acute afib with RVR  PLAN/RECS:  Cont vent support - settings reviewed and/or adjusted - ABG and CXR today -  had episodes of desats and fio2 during the night.  Plan for Trach-ENT consulted-trach to be placed at some point-possible Monday Cont vent bundle Follow CXR intermittently Daily SBT if/when meets criteria MAP goal > 65 mmHg Monitor BMET intermittently Monitor I/Os Correct electrolytes as indicated Cont TFs Bowel regimen initiated 01/06--appears at goal, will continue.   Monitor glucose. Consider SSI for glu > 180 -started IVF's at 50 cc/hr,  PRN ativan, fent infusion -on amiodarone infusion -blood cultures and abx for febrile episodes     The Patient requires high complexity decision making for assessment and support, frequent evaluation and titration of therapies, application of advanced monitoring technologies and extensive interpretation of multiple databases. Critical Care Time devoted to patient care services described in this note is 45 minutes.   Overall, patient is critically ill, prognosis is guarded.    Stephanie Acre, MD [place] [facility] Pager [phone] (please enter 7-digits) On Call Pager - [phone] (please enter 7-digits)

## 2015-10-03 NOTE — Progress Notes (Signed)
Pharmacy Antibiotic Follow-up Note  Mitchell Morrow is a 40 y.o. year-old male admitted on 08/20/2015.  The patient is currently on day 3 of Vancomycin and Zosyn for HCAP.    Assessment/Plan: After discussion with Dr. Dema SeverinMungal, length of therapy for Vancomycin for HCAP is 5 days.  Currently Vancomycin is on hold due to trough of 38.  Trough ordered for 1030 today.  Will place stop date of Tuesday 1/17 for 5 days total of therapy once reinitiated.   Temp (24hrs), Avg:99.3 F (37.4 C), Min:98.8 F (37.1 C), Max:99.6 F (37.6 C)   Recent Labs Lab 09/28/15 0252 09/29/15 0444 09/30/15 0447 10/01/15 0426 10/02/15 0305  WBC 9.8 11.4* 12.2* 11.5* 12.3*    Recent Labs Lab 09/30/15 0447 10/01/15 0426 10/01/15 1426 10/02/15 0305 10/03/15 0434  CREATININE 1.22 1.60* 1.57* 1.50* 1.56*   Estimated Creatinine Clearance: 138.4 mL/min (by C-G formula based on Cr of 1.56).    No Known Allergies  Antimicrobials this admission: Levofloxacin 12/30 >> 01/01 Ceftriaxone 01/02 >> 01/02 Azithro 01/02 >> 01/02 Levofloxacin 01/04 >> 01/05 Vanc 01/05 >> 1/9 Ceftaz 01/05 >>1/9  Vanc 1/13>> Cefepime 1/13>>  Levels/dose changes this admission: Trough on 1/14 of 38 Recheck trough on 1/15  Microbiology results: 1/13 BCx: 1/4 Staph species-consistent with contamination MEC A gene expressed 1/13 UCx: NGTD 1/13 Sputum: respiratory flora 1/2 MRSA PCR: negative  Thank you for allowing pharmacy to be a part of this patient's care.  Azilee Pirro G PharmD 10/03/2015 10:34 AM

## 2015-10-03 NOTE — Progress Notes (Signed)
[facility] - [place] [facility]   PATIENT NAME: Mitchell Morrow    MR#:  [id number]  DATE OF BIRTH:  Dec 03, 1975  SUBJECTIVE:  No acute events overnight. Patient is on amiodarone and started IV fluids this morning.  REVIEW OF SYSTEMS:   Review of Systems  Unable to perform ROS: intubated    DRUG ALLERGIES:  No Known Allergies  VITALS:  Blood pressure 99/67, pulse 99, temperature 99.6 F (37.6 C), temperature source Oral, resp. rate 16, height 5\' 9"  (1.753 m), weight 286.4 kg (631 lb 6.4 oz), SpO2 91 %.  PHYSICAL EXAMINATION:   Physical Exam  Constitutional: He is well-developed, well-nourished, and in no distress. No distress.  Morbidly obese  HENT:  Head: Normocephalic.  Eyes: No scleral icterus.  Neck: No JVD present. No tracheal deviation present.  Cardiovascular: Normal rate.  Exam reveals no gallop and no friction rub.   No murmur heard. Irregular  Pulmonary/Chest: Effort normal and breath sounds normal. No respiratory distress. He has no wheezes. He has no rales. He exhibits no tenderness.  Abdominal: Soft. Bowel sounds are normal. He exhibits no distension and no mass. There is no tenderness. There is no rebound and no guarding.  Musculoskeletal: Normal range of motion. He exhibits edema.  Neurological: He is alert.  Skin: Skin is warm. No rash noted. No erythema.  Psychiatric: Affect and judgment normal.   LABORATORY PANEL:   CBC  Recent Labs Lab 10/02/15 0305  WBC 12.3*  HGB 11.3*  HCT 37.3*  PLT 154   ------------------------------------------------------------------------------------------------------------------  Chemistries   Recent Labs Lab 10/03/15 0434  NA 144  K 4.1  CL 104  CO2 33*  GLUCOSE 129*  BUN 82*  CREATININE 1.56*  CALCIUM 8.5*   ------------------------------------------------------------------------------------------------------------------  Cardiac Enzymes No results for input(s): TROPONINI  in the last 168 hours. ------------------------------------------------------------------------------------------------------------------  RADIOLOGY:  Dg Chest Port 1 View  10/03/2015  CLINICAL DATA:  Respiratory failure, smoker, chronic systolic CHF, atrial fibrillation EXAM: PORTABLE CHEST 1 VIEW COMPARISON:  Portable exam 0546 hours compared to 10/02/2015 FINDINGS: Tip of endotracheal tube projects 3.5 cm above carina. Nasogastric tube extends to at least the mid chest, distal and stent obscured by light technique. LEFT jugular central venous catheter tip projects over SVC. Enlargement of cardiac silhouette with pulmonary vascular congestion. Motion artifacts degrade assessment of lung bases. Questionable perihilar infiltrate versus edema. Bibasilar atelectasis. No gross pneumothorax. IMPRESSION: Enlargement of cardiac silhouette with pulmonary vascular congestion. Bibasilar atelectasis with question mild pulmonary edema. Electronically Signed   By: Ulyses Southward M.D.   On: 10/03/2015 09:38   Dg Chest Port 1 View  10/02/2015  CLINICAL DATA:  Hypoxia this morning. Check endotracheal tube position. EXAM: PORTABLE CHEST 1 VIEW COMPARISON:  09/27/2015 FINDINGS: Patient is moderately rotated to the left. Endotracheal tube approximately 3.2 cm above the carina. Left IJ central venous catheter unchanged. Nasogastric tube courses into the region of the stomach and off the inferior portion of the film as tip is not visualized. Lungs are hypoinflated with persistent prominence of the perihilar markings suggesting mild interstitial edema unchanged. Persistent left base opacification likely effusion with atelectasis although cannot exclude infection. Stable cardiomegaly. Remainder the exam is unchanged. IMPRESSION: Hypoinflation with stable left base opacification likely effusions/atelectasis although cannot exclude infection. Stable cardiomegaly and mild interstitial edema unchanged. Tubes and lines unchanged.  Electronically Signed   By: Elberta Fortis M.D.   On: 10/02/2015 10:20    ASSESSMENT AND PLAN:   40 year old male  with a history of morbid obesity and tobacco abuse who presented with lower extremity edema and found to have bilateral pneumonia.  1. Acute hypercapnic respiratory failure-: This is multifactorial and thought to be due to obesity hypoventilation syndrome, community-acquired pneumonia and pulmonary edema versus ARDS.  Vent management as per pulmonary.   Patient will need ENT for tracheostomy which is planned for Monday. He would benefit from long-term acute care facility. However, due to insurance reasons he is not a candidate for LTAC.  2. Bilateral community-acquired pneumonia with septic shock He was on vancomycin and ceftazidime and he had completed course of treatment. He had a fever on 10/01/2015 and. Was started on cefepime and vancomycin. Blood cultures show contaminant.  He is off pressors.   3.New onset systolic congestive heart failure,  EF 45%  - had elevated BNP, edema and vascular congestion on chest x-ray  Stopped IV Lasix due to worsening renal function As per nephrology consultation if further diuresis is required consider. Lasix drip as he is unable to handle large Lasix boluses at this time.  Pulmonary started low dose IV fluids today due to low blood pressure. Continue to monitor input and output.  4. Acute renal failure - Due to ATN and diuresis Appreciate nephrology help Creatinine has improved.   5. Cutaneous candidiasis nystatin powder  6. Morbid obesity will need bariatric referral as outpatient  7. Thrombocytopenia - this is due to acute illness. Platelet count has improved. negative hepatitis panel, negative HIV.  8.Hyperbilirubinemia could be passive congestion from congestive heart failure. hepatitis panel and ultrasound of right upper quadrant suggestive of cirrhotic changes. US showed Cirrhosis 9. Atrial fibrillation, new onset:  Continue amiodarone drip and metoprolol. Appreciate cardiology consult  If blood pressure remains a problem then we'll need to discontinue metoprolol.  CODE STATUS: full.  Critical care TOTAL TIME TAKING CARE OF THIS PATIENT: 30 critical care minutes.    Danija Gosa M.D on 10/03/2015   Between 7am to 6pm - Pager - [phone]  After 6pm go to [email or web address] - password [email or web address] [facility]  [facility]  Office  [phone]  CC: Primary care physician; No PCP Per Patient  Note: This dictation was prepared with Dragon dictation along with smaller phrase technology. Any transcriptional errors that result from this process are unintentional.

## 2015-10-03 NOTE — Progress Notes (Signed)
Pharmacy Antibiotic Follow-up Note  Mitchell Morrow is a 40 y.o. year-old male admitted on 08/24/2015.  The patient is currently on day 3 of Vancomycin and Zosyn for HCAP.    Assessment/Plan: After discussion with Dr. Dema SeverinMungal, length of therapy for Vancomycin for HCAP is 5 days.  Currently Vancomycin is on hold due to trough of 38.  Trough ordered for 1030 today.  Will place stop date of Tuesday 1/17 for 5 days total of therapy once reinitiated.   1/15: Random Vanc level ~24 hrs after last dose at 1200 = 21 mcg/ml. Will order next dose ~1800 tonight. Will order Vancomycin 1500mg  IV q12h x 2 days (for a total of 5 days therapy.  Temp (24hrs), Avg:99.3 F (37.4 C), Min:98.8 F (37.1 C), Max:99.6 F (37.6 C)   Recent Labs Lab 09/28/15 0252 09/29/15 0444 09/30/15 0447 10/01/15 0426 10/02/15 0305  WBC 9.8 11.4* 12.2* 11.5* 12.3*     Recent Labs Lab 09/30/15 0447 10/01/15 0426 10/01/15 1426 10/02/15 0305 10/03/15 0434  CREATININE 1.22 1.60* 1.57* 1.50* 1.56*   Estimated Creatinine Clearance: 138.4 mL/min (by C-G formula based on Cr of 1.56).    No Known Allergies  Antimicrobials this admission: Levofloxacin 12/30 >> 01/01 Ceftriaxone 01/02 >> 01/02 Azithro 01/02 >> 01/02 Levofloxacin 01/04 >> 01/05 Vanc 01/05 >> 1/9 Ceftaz 01/05 >>1/9  Vanc 1/13>> Cefepime 1/13>>  Levels/dose changes this admission: Trough on 1/14 of 38 Recheck trough on 1/15  Microbiology results: 1/13 BCx: 1/4 Staph species-consistent with contamination MEC A gene expressed 1/13 UCx: NGTD 1/13 Sputum: respiratory flora 1/2 MRSA PCR: negative  Thank you for allowing pharmacy to be a part of this patient's care.  Maurion Walkowiak A PharmD 10/03/2015 2:00 PM

## 2015-10-04 ENCOUNTER — Inpatient Hospital Stay: Payer: Medicaid Other

## 2015-10-04 ENCOUNTER — Inpatient Hospital Stay: Payer: Medicaid Other | Admitting: Certified Registered"

## 2015-10-04 ENCOUNTER — Encounter: Admission: EM | Disposition: E | Payer: Self-pay | Source: Home / Self Care | Attending: Internal Medicine

## 2015-10-04 HISTORY — PX: TRACHEOSTOMY TUBE PLACEMENT: SHX814

## 2015-10-04 LAB — BASIC METABOLIC PANEL
Anion gap: 8 (ref 5–15)
BUN: 99 mg/dL — AB (ref 6–20)
CO2: 31 mmol/L (ref 22–32)
Calcium: 8.5 mg/dL — ABNORMAL LOW (ref 8.9–10.3)
Chloride: 104 mmol/L (ref 101–111)
Creatinine, Ser: 2.03 mg/dL — ABNORMAL HIGH (ref 0.61–1.24)
GFR, EST AFRICAN AMERICAN: 45 mL/min — AB (ref >60)
GFR, EST NON AFRICAN AMERICAN: 39 mL/min — AB (ref >60)
Glucose, Bld: 119 mg/dL — ABNORMAL HIGH (ref 65–99)
POTASSIUM: 4.4 mmol/L (ref 3.5–5.1)
SODIUM: 143 mmol/L (ref 135–145)

## 2015-10-04 LAB — CBC
HCT: 38.5 % — ABNORMAL LOW (ref 40.0–52.0)
Hemoglobin: 11.4 g/dL — ABNORMAL LOW (ref 13.0–18.0)
MCH: 25.7 pg — AB (ref 26.0–34.0)
MCHC: 29.7 g/dL — ABNORMAL LOW (ref 32.0–36.0)
MCV: 86.4 fL (ref 80.0–100.0)
PLATELETS: 137 10*3/uL — AB (ref 150–440)
RBC: 4.46 MIL/uL (ref 4.40–5.90)
RDW: 21.2 % — ABNORMAL HIGH (ref 11.5–14.5)
WBC: 10.3 10*3/uL (ref 3.8–10.6)

## 2015-10-04 LAB — BLOOD GAS, ARTERIAL
ACID-BASE EXCESS: 2 mmol/L (ref 0.0–3.0)
Acid-Base Excess: 0.5 mmol/L (ref 0.0–3.0)
Allens test (pass/fail): POSITIVE — AB
Allens test (pass/fail): POSITIVE — AB
Bicarbonate: 30.4 mEq/L — ABNORMAL HIGH (ref 21.0–28.0)
Bicarbonate: 30.5 mEq/L — ABNORMAL HIGH (ref 21.0–28.0)
FIO2: 0.6
FIO2: 0.6
MECHANICAL RATE: 25
MECHVT: 500 mL
O2 Saturation: 95.2 %
O2 Saturation: 94.3 %
PATIENT TEMPERATURE: 37
PATIENT TEMPERATURE: 37
PEEP: 8 cmH2O
PEEP: 8 cmH2O
PH ART: 7.21 — AB (ref 7.350–7.450)
PO2 ART: 92 mmHg (ref 83.0–108.0)
RATE: 20 resp/min
VT: 500 mL
pCO2 arterial: 76 mmHg (ref 32.0–48.0)
pCO2 arterial: 65 mmHg — ABNORMAL HIGH (ref 32.0–48.0)
pH, Arterial: 7.28 — ABNORMAL LOW (ref 7.350–7.450)
pO2, Arterial: 81 mmHg — ABNORMAL LOW (ref 83.0–108.0)

## 2015-10-04 LAB — GLUCOSE, CAPILLARY
GLUCOSE-CAPILLARY: 123 mg/dL — AB (ref 65–99)
Glucose-Capillary: 122 mg/dL — ABNORMAL HIGH (ref 65–99)

## 2015-10-04 SURGERY — CREATION, TRACHEOSTOMY
Anesthesia: General

## 2015-10-04 MED ORDER — AMIODARONE HCL 200 MG PO TABS
400.0000 mg | ORAL_TABLET | Freq: Two times a day (BID) | ORAL | Status: DC
  Administered 2015-10-04: 400 mg via ORAL
  Filled 2015-10-04: qty 2

## 2015-10-04 MED ORDER — MIDAZOLAM HCL 2 MG/2ML IJ SOLN
INTRAMUSCULAR | Status: DC | PRN
  Administered 2015-10-04: 2 mg via INTRAVENOUS

## 2015-10-04 MED ORDER — SODIUM CHLORIDE 0.9 % IV SOLN
INTRAVENOUS | Status: DC | PRN
  Administered 2015-10-04: 12:00:00 via INTRAVENOUS

## 2015-10-04 MED ORDER — PHENYLEPHRINE HCL 10 MG/ML IJ SOLN
INTRAMUSCULAR | Status: DC | PRN
  Administered 2015-10-04 (×2): 100 ug via INTRAVENOUS

## 2015-10-04 MED ORDER — PROPOFOL 10 MG/ML IV BOLUS
INTRAVENOUS | Status: DC | PRN
  Administered 2015-10-04: 50 mg via INTRAVENOUS

## 2015-10-04 MED ORDER — ROCURONIUM BROMIDE 100 MG/10ML IV SOLN
INTRAVENOUS | Status: DC | PRN
Start: 2015-10-04 — End: 2015-10-04
  Administered 2015-10-04: 30 mg via INTRAVENOUS
  Administered 2015-10-04: 20 mg via INTRAVENOUS

## 2015-10-04 MED ORDER — ONDANSETRON HCL 4 MG/2ML IJ SOLN
INTRAMUSCULAR | Status: DC | PRN
  Administered 2015-10-04: 4 mg via INTRAVENOUS

## 2015-10-04 MED ORDER — AMIODARONE HCL IN DEXTROSE 360-4.14 MG/200ML-% IV SOLN
30.0000 mg/h | INTRAVENOUS | Status: DC
  Administered 2015-10-04 – 2015-10-05 (×3): 30 mg/h via INTRAVENOUS
  Filled 2015-10-04 (×7): qty 200

## 2015-10-04 MED ORDER — LIDOCAINE-EPINEPHRINE (PF) 1 %-1:200000 IJ SOLN
INTRAMUSCULAR | Status: AC
  Filled 2015-10-04: qty 30

## 2015-10-04 MED ORDER — LIDOCAINE-EPINEPHRINE (PF) 1 %-1:200000 IJ SOLN
INTRAMUSCULAR | Status: DC | PRN
  Administered 2015-10-04: 3 mL

## 2015-10-04 SURGICAL SUPPLY — 22 items
BLADE SURG 15 STRL LF DISP TIS (BLADE) ×1 IMPLANT
BLADE SURG 15 STRL SS (BLADE) ×2
BLADE SURG SZ11 CARB STEEL (BLADE) ×3 IMPLANT
CANISTER SUCT 1200ML W/VALVE (MISCELLANEOUS) ×3 IMPLANT
GLOVE EXAM LX STRL 7.5 (GLOVE) ×3 IMPLANT
GOWN STRL REUS W/ TWL LRG LVL3 (GOWN DISPOSABLE) ×3 IMPLANT
GOWN STRL REUS W/TWL LRG LVL3 (GOWN DISPOSABLE) ×6
HARMONIC SCALPEL FOCUS (MISCELLANEOUS) ×3 IMPLANT
NS IRRIG 500ML POUR BTL (IV SOLUTION) ×3 IMPLANT
PACK HEAD/NECK (MISCELLANEOUS) ×3 IMPLANT
PAD GROUND ADULT SPLIT (MISCELLANEOUS) ×3 IMPLANT
SPONGE EXCIL AMD DRAIN 4X4 6P (MISCELLANEOUS) ×3 IMPLANT
SUCTION FRAZIER HANDLE 10FR (MISCELLANEOUS) ×2
SUCTION TUBE FRAZIER 10FR DISP (MISCELLANEOUS) ×1 IMPLANT
SUT ETHILON 2 0 FS 18 (SUTURE) ×3 IMPLANT
SUT SILK 2 0 (SUTURE) ×2
SUT SILK 2-0 18XBRD TIE 12 (SUTURE) ×1 IMPLANT
SUT VIC AB 4-0 RB1 27 (SUTURE) ×2
SUT VIC AB 4-0 RB1 27X BRD (SUTURE) ×1 IMPLANT
TUBE TRACH 7.0 EXL PROX CUF (TUBING) ×3 IMPLANT
TUBE TRACH SHILEY  6 DIST  CUF (TUBING) IMPLANT
TUBE TRACH SHILEY 8 DIST CUF (TUBING) IMPLANT

## 2015-10-04 NOTE — Progress Notes (Signed)
Updated Dr. Elenore RotaJuengel about patient's recent ABG result and pending chest xray.

## 2015-10-04 NOTE — Progress Notes (Signed)
eLink Physician-Brief Progress Note Patient Name: Mitchell Morrow DOB: 05/25/1976 MRN: [id number]   Date of Service  Sep 24, 2015  HPI/Events of Note  RN reports guppie breathing after return from trach ABG - resp acidosis  eICU Interventions  Increase RR 24 Rpt ABG in 2h CXR ok     Intervention Category Intermediate Interventions: Respiratory distress - evaluation and management  Kalan Yeley V. Sep 24, 2015, 6:56 PM

## 2015-10-04 NOTE — Progress Notes (Signed)
PULMONARY/CCM PROGRESS NOTE  Active problems: Acute ventilator dependent hypercapneic respiratory failure-failed extubation x 1 with morbid obesity Vasopressor dependent hypotension AKI - baseline Cr 1.06, continue to monitor.  Morbid obesity Smoker Cor pulmonale Hyperbilirubinemia - likely cholestasis Thrombocytopenia - appears chronic Acute afib with RVR  Lines, Tubes, etc: ETT 01/02 >> . Patient self extubated, subsequently reintubated 09/25/2015 L IJ CVL 01/02 >>   Microbiology: Blood 12/30 >>  MRSA PCR 01/02 >> NEG Urine 01/04 >> NEG Blood 01/04 >> no growth   PCT 01/04: 0.74  Antibiotics:  Levofloxacin 12/30 >> 01/01 Ceftriaxone 01/02 >> 01/02 Azithro 01/02 >> 01/02 Levofloxacin 01/04 >> 01/05 Vanc 01/05 >> 1/9 Ceftaz 01/05 >>1/9   PCT 01/04: 0.74, 0.95, 0.77  Studies/Events: 12/31 TTE:  poor quality study, LVEF 45% 12/31 RUQ: Nodular contour the liver raising the question of cirrhosis. Gallbladder wall thickening, nonspecific in appearance 01/04 night - fever, hypotension. Cultures obtained. Abx expanded -failed self extubation  Consultants:  Renal 01/05: no acute indication for HD as this time   Best Practice: DVT: enoxaparin SUP: enteral famotidine Nutrition: TFs Glycemic control: N/I Sedation/analgesia: fent/dexmedetomidine infusion. PRN lorazepam   Subj: Patient remains intubated,sedated, will track with eyes Will need trach for survival -ENT consulted -afib on amiodarone gtt   Obj: Filed Vitals:   10-23-2015 0700 2015/10/23 0800 10-23-15 0821 10-23-2015 0852  BP: 89/65 91/61    Pulse: 97 98    Temp:   97.9 F (36.6 C)   TempSrc:   Oral   Resp: 18 16    Height:      Weight:      SpO2: 92% 93%  91%    Gen: morbidly obese HEENT:  WNL Neck: CVL site clean, JVP cannot be assessed Chest: clear today somewhat prolonged exp time Cardiac: distant HS, regular, no M noted Abd: obese, soft, + BS Ext: symmetric edema, chronic stasis  changes Neuro: GCS<10T  BMET    Component Value Date/Time   NA 143 10/23/2015 0607   K 4.4 10-23-15 0607   CL 104 10-23-15 0607   CO2 31 23-Oct-2015 0607   GLUCOSE 119* 2015-10-23 0607   BUN 99* 10-23-2015 0607   CREATININE 2.03* 10-23-15 0607   CALCIUM 8.5* October 23, 2015 0607   GFRNONAA 39* 2015-10-23 0607   GFRAA 45* 10/23/15 0607    CBC    Component Value Date/Time   WBC 10.3 Oct 23, 2015 0607   RBC 4.46 10-23-2015 0607   HGB 11.4* October 23, 2015 0607   HCT 38.5* Oct 23, 2015 0607   PLT 137* 23-Oct-2015 0607   MCV 86.4 October 23, 2015 0607   MCH 25.7* 2015/10/23 0607   MCHC 29.7* 10-23-15 0607   RDW 21.2* October 23, 2015 0607   LYMPHSABS 2.1 09/24/2015 0512   MONOABS 1.5* 09/24/2015 0512   EOSABS 0.3 09/24/2015 0512   BASOSABS 0.0 09/24/2015 0512    CXR:  New linear atelectasis in the right lung apex. Persistent bibasilar atelectasis and effusions   IMPRESSION: 1) Acute on chronic hypercarbic respiratory failure - multifactorial 2) Obesity hypoventilation syndrome 3) Atelectasis 4) Pulm edema vs ALI/ARDS 5) Fever, hypotension, suspect severe sepsis 01/05 - likely due to PNA-resolved 6) Hypotension resolved 7) AKI, non-oliguric - Cr improving 01/06 8) Mild hyperglycemia without prior dx of DM 9) Elevated bilirubin - likely cholestasis. Rest of LFTs normal. RUQ [place] unrevealing 10) Thrombocytopenia, unknown chronicity - stable 11) ICU associated discomfort 12)low BP -?volume depleted 13)acute afib with RVR  PLAN/RECS:  Cont vent support - settings reviewed and/or adjusted - ABG and  CXR today - had episodes of desats and fio2 during the night.  Plan for Trach-ENT consulted-trach to be placed at some point-possible Monday Cont vent bundle Follow CXR intermittently Daily SBT if/when meets criteria MAP goal > 65 mmHg Monitor BMET intermittently Monitor I/Os Correct electrolytes as indicated Cont TFs Bowel regimen initiated 01/06--appears at goal, will continue.   Monitor glucose. Consider SSI for glu > 180 -started IVF's at 50 cc/hr,  PRN ativan, fent infusion -on amiodarone infusion -blood cultures and abx for febrile episodes    -Wells Guileseep Raylan Hanton, M.D.   Critical Care Attestation.  I have personally obtained a history, examined the patient, evaluated laboratory and imaging results, formulated the assessment and plan and placed orders. The Patient requires high complexity decision making for assessment and support, frequent evaluation and titration of therapies, application of advanced monitoring technologies and extensive interpretation of multiple databases. The patient has critical illness that could lead imminently to failure of 1 or more organ systems and requires the highest level of physician preparedness to intervene.  Critical Care Time devoted to patient care services described in this note is 35 minutes and is exclusive of time spent in procedures.

## 2015-10-04 NOTE — Progress Notes (Signed)
ENT Postop check  Trach tube sitting in good position. No bleeding and dressing is dry.  Having some challenges with ventilation and increased vent settings. Just obtained CXR.  Getting less sedation.  Stable trach and airway postop.  Sutures can be removed in 5 days.   Herby Abrahamaul JuengelMD 10/01/2015 6:40pm.

## 2015-10-04 NOTE — Progress Notes (Signed)
Nutrition Follow-up:  Planning trach today.   Tube feeding of vital high protein on hold since midnight.  Previously tolerating tube feeding at goal rate of 7760ml/hr. Plan to restart tube feeding once trach placed.    Will follow-up in am  HIGH Care Level  Dres[place] Ament B. Freida BusmanAllen, RD, LDN [phone] (pager) Weekend/On-Call pager [phone])

## 2015-10-04 NOTE — Progress Notes (Signed)
Updated Dr. Vassie LollAlva about patient's ABG and Chest xray result. Order to increase RR to 24. Repeat ABG in 2 hrs.

## 2015-10-04 NOTE — Progress Notes (Addendum)
Pt had trach placed today. Pt's amiodarone drip was discontinued and switched to PO today. He no longer has OG tube and is unable to swallow. Dr. Ladona Ridgelaylor with Cardiology paged and informed of the situation. Orders for an amiodarone drip at .5mg /min given. Per Dr. Ladona Ridgelaylor this isssue will need to be readdressed with rounding MD in AM.

## 2015-10-04 NOTE — Progress Notes (Signed)
This morning - notified Dr. Ardyth Manam and Dr. Juliene PinaMody of decreased urine output and increased creatine to 2.03.  Patient had trach placed this morning.  Patient this evening started to have guppy breathing and urine output total 150ml- notified Dr. Vassie LollAlva with [facility]- ordered for ABG and chest xray stat- also to change vent settings to 60% Fi02 and respiratory rate to 20.

## 2015-10-04 NOTE — Op Note (Signed)
09/26/2015 12:33 PM  Tonna CornerMiller,  Jaquavion [id number]  Pre-Op Dx: Vent dependent, prolonged  intubation  Post-Op Dx:  Same  Proc:  Tracheostomy  Surg:  Kaidence Sant H  Anes:  GOT  EBL:  30 mL  Comp:  None  Findings:  Very thick fat neck because he is so obese. His trachea went posteriorly. He has thick isthmus of thyroid that was divided above the trachea.  Procedure:  The patient was brought from the intensive care unit to the operating room and transferred to an operating table.  Anesthesia was administered per indwelling orotracheal tube.   Neck extension was achieved as possible anda shoulder rolke was placed.  The lower neck was palpated with the findings as described above.  1% Xylocaine with 1:100,000 epinephrine, 3 cc's, was infiltrated into the surgical field for intraoperative hemostasis.  Several minutes were allowed for this to take effect. The patient was prepped in a sterile fashion with a surgical prep from the chin down to the upper chest.  Sterile draping was accomplished in the standard fashion.  A  2-1/2 cm horizontal incision was made sharply a finger's breadth above the sternal notch, and extended through skin and subcutaneous fat.  Using cautery, the superficial layer of the deep cervical fascia was lysed.  Additional dissection revealed the strap muscles.  The midline raphe was divided in two layers and the muscles retracted laterally.  The pretracheal plane was visualized.  This was entered bluntly.  The thyroid isthmus was isolated and divided with the Harmonic scalpel.  The thyroid gland was retracted to either side.  The anterior face of the trachea was cleared.  In the  second interspace, a transverse incision was made between cartilage rings into the tracheal lumen.  A 6 mm wide inferiorly based flap was generated.   A previously tested  # 7 Shiley XLT proximal cuffed tracheostomy tube was brought into the field.  With the endotracheal tube under direct visualization  through the tracheostomy, it was gently backed up.  The tracheostomy tube was inserted into the tracheal lumen.  Hemostasis was observed. The cuff was inflated and observed to be intact and containing pressure. The inner cannula was placed and ventilation assumed per tracheostomy tube.  Good chest wall motion was observed, and CO2 was documented per anesthesia.  The trach tube was secured in the standard fashion with trach ties. A 2-0 Nylon suture was used to secure the trach tube to the skin on both sides.  Hemostasis was observed again.  When satisfactory ventilation was assured, the orotracheal tube was removed.  At this point the procedure was completed.  The patient was returned to anesthesia, awakened as possible, and transferred back to the intensive care unit in stable condition.  Comment: 40 y.o. African-American male with prolonged ventilation was the indication for today's procedure.  Anticipate a routine postoperative recovery including standard tracheal hygiene.  The sutures should be removed in 5 days.  When the patient no longer requires ventilator or pressure support, the cuff should be deflated.  Changing to an uncuffed tube and downsizing will be according to the clinical condition of the patient.   Meganne Rita H  12:33 PM 10/17/2015

## 2015-10-04 NOTE — Progress Notes (Signed)
PHARMACY - CRITICAL CARE PROGRESS NOTE  Pharmacy Consult for Constipation Prevention.  Indication: ICU Status   No Known Allergies  Patient Measurements: Height: 5\' 9"  (175.3 cm) Weight: (!) 637 lb (288.941 kg) IBW/kg (Calculated) : 70.7   Vital Signs: Temp: 97.9 F (36.6 C) (01/16 0821) Temp Source: Oral (01/16 0821) BP: 92/62 mmHg (01/16 1100) Pulse Rate: 96 (01/16 1100) Intake/Output from previous day: 01/15 0701 - 01/16 0700 In: 4378.1 [I.V.:2042.1; NG/GT:1186; IV Piggyback:1150] Out: 850 [Urine:850] Intake/Output from this shift:   Vent settings for last 24 hours: Vent Mode:  [-] PRVC FiO2 (%):  [45 %] 45 % Set Rate:  [16 bmp] 16 bmp Vt Set:  [500 mL] 500 mL PEEP:  [8 cmH20] 8 cmH20  Labs:  Recent Labs  10/02/15 0305 10/03/15 0434 10/06/2015 0607  WBC 12.3*  --  10.3  HGB 11.3*  --  11.4*  HCT 37.3*  --  38.5*  PLT 154  --  137*  CREATININE 1.50* 1.56* 2.03*   Estimated Creatinine Clearance: 107 mL/min (by C-G formula based on Cr of 2.03).   Recent Labs  10/03/15 1615 10/03/15 2335 09/20/2015 0715  GLUCAP 133* 128* 123*    Microbiology:   Medications:  Scheduled:  . [MAR Hold] amiodarone  400 mg Oral BID  . [MAR Hold] antiseptic oral rinse  7 mL Mouth Rinse 10 times per day  . [MAR Hold] budesonide  0.25 mg Nebulization 4 times per day  . [MAR Hold] ceFEPime (MAXIPIME) IV  2 g Intravenous 3 times per day  . [MAR Hold] chlorhexidine gluconate  15 mL Mouth Rinse BID  . [MAR Hold] enoxaparin (LOVENOX) injection  40 mg Subcutaneous Q12H  . [MAR Hold] famotidine  20 mg Per Tube BID  . [MAR Hold] feeding supplement (PRO-STAT SUGAR FREE 64)  30 mL Oral TID  . [MAR Hold] free water  200 mL Per Tube 3 times per day  . [MAR Hold] Influenza vac split quadrivalent PF  0.5 mL Intramuscular Tomorrow-1000  . [MAR Hold] ipratropium-albuterol  3 mL Nebulization Q6H  . [MAR Hold] metoprolol  5 mg Intravenous 4 times per day  . [MAR Hold] nystatin   Topical BID   . [MAR Hold] senna-docusate  1 tablet Oral QHS  . [MAR Hold] sodium chloride  3 mL Intravenous Q12H  . [MAR Hold] sodium chloride  3 mL Intravenous Q12H  . [MAR Hold] vancomycin  1,500 mg Intravenous Q12H   Infusions:  . sodium chloride 50 mL/hr at 10/03/15 0600  . feeding supplement (VITAL HIGH PROTEIN) Stopped (10/10/2015 0006)  . fentaNYL infusion INTRAVENOUS 200 mcg/hr (10/13/2015 1120)    Assessment: Pharmacy consulted for constipation management for 40 yo male ICU patient requiring mechanical ventilation.  Patient is currently on senna/docusate 1 tab v/t qhs.    Plan:  Patient had multiple BM 1/12 but none since. On rounds, team decided to address constipation tomorrow as patient in OR today. For now, will continue senna/docusate 1 tab v/t qhs and reassess tomorrow.   Pharmacy will continue to monitor and adjust per consult.    Luisa Harthristy, Shriyan Arakawa D 09/22/2015,12:27 PM

## 2015-10-04 NOTE — Progress Notes (Signed)
[facility] [place] [facility]   PATIENT NAME: Tonna Cornerherlo Hietpas    MR#:  [id number]  DATE OF BIRTH:  01/30/1976  SUBJECTIVE:    [facility] am[facility] will be changed to Po after [facility]  REVIEW OF SYSTEMS:   Review of Systems  Unable to perform ROS: intubated    DRUG ALLERGIES:  No Known Allergies  VITALS:  Blood pressure 91/61, pulse 98, temperature 97.9 F (36.6 C), temperature source Oral, resp. rate 16, height 5\' 9"  (1.753 m), weight 288.941 kg (637 lb), SpO2 91 %.  PHYSICAL EXAMINATION:   Physical Exam  Constitutional: He is well-developed, well-nourished, and in no distress. No distress.  Morbidly obese  HENT:  Head: Normocephalic.  Eyes: No scleral icterus.  Neck: No JVD present. No tracheal deviation present.  Cardiovascular: Normal rate.  Exam reveals no gallop and no friction rub.   No murmur heard. Pulmonary/Chest: Effort normal and breath sounds normal. No respiratory distress. He has no wheezes. He has no rales. He exhibits no tenderness.  Abdominal: Soft. Bowel sounds are normal. He exhibits no distension and no mass. There is no tenderness. There is no rebound and no guarding.  Musculoskeletal: Normal range of motion. He exhibits edema.  Neurological: He is alert.  Skin: Skin is warm. No rash noted. No erythema.  Psychiatric: Affect and judgment normal.   LABORATORY PANEL:   CBC  Recent Labs Lab August 05, 2016 0607  WBC 10.3  HGB 11.4*  HCT 38.5*  PLT 137*   ------------------------------------------------------------------------------------------------------------------  Chemistries   Recent Labs Lab August 05, 2016 0607  NA 143  K 4.4  CL 104  CO2 31  GLUCOSE 119*  BUN 99*  CREATININE 2.03*  CALCIUM 8.5*   ------------------------------------------------------------------------------------------------------------------  Cardiac Enzymes No results for input(s): TROPONINI in the last 168  hours. ------------------------------------------------------------------------------------------------------------------  RADIOLOGY:  Dg Chest Port 1 View  10/03/2015  CLINICAL DATA:  Respiratory failure, smoker, chronic systolic CHF, atrial fibrillation EXAM: PORTABLE CHEST 1 VIEW COMPARISON:  Portable exam 0546 hours compared to 10/02/2015 FINDINGS: Tip of endotracheal tube projects 3.5 cm above carina. Nasogastric tube extends to at least the mid chest, distal and stent obscured by light technique. LEFT jugular central venous catheter tip projects over SVC. Enlargement of cardiac silhouette with pulmonary vascular congestion. Motion artifacts degrade assessment of lung bases. Questionable perihilar infiltrate versus edema. Bibasilar atelectasis. No gross pneumothorax. IMPRESSION: Enlargement of cardiac silhouette with pulmonary vascular congestion. Bibasilar atelectasis with question mild pulmonary edema. Electronically Signed   By: Ulyses SouthwardMark  Boles M.D.   On: 10/03/2015 09:38    ASSESSMENT AND PLAN:   40 year old male with a history of morbid obesity and tobacco abuse who presented with lower extremity edema and found to have bilateral pneumonia.  1. Acute hypercapnic respiratory failure-: This is multifactorial and thought to be due to obesity hypoventilation syndrome, community-acquired pneumonia and pulmonary edema versus ARDS.  Vent management as per pulmonary.   Patient PLAN for tracheostomy today. He would benefit from long-term acute care facility. However, due to insurance reasons he is not a candidate for LTAC according to Dr Belia HemanKasa..  2. Bilateral community-acquired pneumonia with septic shock He was on vancomycin and ceftazidime and he had completed course of treatment. He had a fever on 10/01/2015 and Was started on cefepime and vancomycin. Repeat Blood cultures on 1/13 show contaminant.  He is off pressors.   3.New onset systolic congestive heart failure,  EF 45%  - had elevated BNP,  edema and vascular congestion on chest x-ray  Stopped IV Lasix due to worsening renal function As per nephrology consultation if further diuresis is required consider. Lasix drip as he is unable to handle large Lasix boluses at this time.  Pulmonary started low dose IV fluids due to low blood pressure and decreased UOP.  No signs of failure today.   4. Acute renal failure - Due to ATN and diuresis Appreciate nephrology help Creatinine has improved.   5. Cutaneous candidiasis nystatin powder  6. Morbid obesity will need bariatric referral as outpatient  7. Thrombocytopenia - this is due to acute illness. Platelet count has improved. negative hepatitis panel, negative HIV.  8.Hyperbilirubinemia could be passive congestion from congestive heart failure. hepatitis panel and ultrasound of right upper quadrant suggestive of cirrhotic changes. US showed Cirrhosis  9. Atrial fibrillation, new onset: Patient is on amiodarone drip until tracheostomy. As per cardiology change to po amiodarone 400 mg bid x 1 week, 200 mg bid x 1 week, 200 mg daily thereafter. Continue metoprolol for now. If blood pressure remains a problem then we may need to discontinue metoprolol. Risk of long-term full dose anticoagulation outweighed benefit at this time as per cardiac.    CODE STATUS: full.  Critical care TOTAL TIME TAKING CARE OF THIS PATIENT: 30 critical care minutes.    He remains critically ill and high risk for cardiopulmonary arrest. Patient needs tracheostomy which is planned for today. Patient will also likely require peg tube.  Plan of care discussed with intensivist.  Nikos Anglemyer M.D on 10/05/2015   Between 7am to 6pm - Pager - [phone]  After 6pm go to [email or web address] - password [email or web address] [facility]  [facility]  Office  [phone]  CC: Primary care physician; No PCP Per Patient  Note: This dictation was prepared with Dragon dictation along with smaller phrase  technology. Any transcriptional errors that result from this process are unintentional.

## 2015-10-04 NOTE — Progress Notes (Signed)
Pharmacy Antibiotic Follow-up Note  Mitchell Morrow is a 40 y.o. year-old male admitted on 02/24/2015.  The patient is currently on day 4 of Vancomycin and cefepime for HCAP.    Assessment/Plan: Continue vancomycin 1500 mg iv q 12 hours with vancomycin to stop 01/17 after 5 days as previously planned. Will continue cefepime 2 g iv q 8 hours Will f/u duration of therapy with MD.   Temp (24hrs), Avg:98.5 F (36.9 C), Min:97.7 F (36.5 C), Max:99 F (37.2 C)   Recent Labs Lab 09/29/15 0444 09/30/15 0447 10/01/15 0426 10/02/15 0305 10/11/2015 0607  WBC 11.4* 12.2* 11.5* 12.3* 10.3     Recent Labs Lab 10/01/15 0426 10/01/15 1426 10/02/15 0305 10/03/15 0434 09/19/2015 0607  CREATININE 1.60* 1.57* 1.50* 1.56* 2.03*   Estimated Creatinine Clearance: 107 mL/min (by C-G formula based on Cr of 2.03).    No Known Allergies  Antimicrobials this admission: Levofloxacin 12/30 >> 01/01 Ceftriaxone 01/02 >> 01/02 Azithro 01/02 >> 01/02 Levofloxacin 01/04 >> 01/05 Vanc 01/05 >> 1/9 Ceftaz 01/05 >>1/9  Vanc 1/13>> Cefepime 1/13>>  Levels/dose changes this admission: Trough on 1/14 of 38 Recheck trough on 1/15  Microbiology results: 1/13 BCx: 1/4 Staph species-consistent with contamination MEC A gene expressed (consistent with contamination) 1/13 UCx: NGTD 1/13 Sputum: respiratory flora 1/2 MRSA PCR: negative  Thank you for allowing pharmacy to be a part of this patient's care.  Luisa HartChristy, Holden Draughon D PharmD 09/26/2015 12:22 PM

## 2015-10-04 NOTE — Anesthesia Postprocedure Evaluation (Signed)
Anesthesia Post Note  Patient: Mitchell Morrow  Procedure(s) Performed: Procedure(s) (LRB): TRACHEOSTOMY (N/A)  Patient location during evaluation: SICU Anesthesia Type: General Level of consciousness: sedated Pain management: pain level controlled Vital Signs Assessment: post-procedure vital signs reviewed and stable Respiratory status: patient on ventilator - see flowsheet for VS Cardiovascular status: blood pressure returned to baseline Postop Assessment: no headache Anesthetic complications: no    Last Vitals:  Filed Vitals:   09/30/2015 1100 09/26/2015 1300  BP: 92/62   Pulse: 96   Temp:  37.1 C  Resp: 17     Last Pain:  Filed Vitals:   10/12/2015 1310  PainSc: 0-No pain                 Tamario Heal M

## 2015-10-04 NOTE — Transfer of Care (Signed)
Immediate Anesthesia Transfer of Care Note  Patient: Mitchell Morrow  Procedure(s) Performed: Procedure(s): TRACHEOSTOMY (N/A)  Patient Location: ICU  Anesthesia Type:General  Level of Consciousness: sedated  Airway & Oxygen Therapy: Patient remains intubated per anesthesia plan  Post-op Assessment: Report given to RN, Post -op Vital signs reviewed and stable and Patient moving all extremities X 4  Post vital signs: Reviewed and stable  Last Vitals:  Filed Vitals:   10/03/2015 1000 10/03/2015 1100  BP: 92/57 92/62  Pulse: 98 96  Temp:    Resp: 17 17    Complications: No apparent anesthesia complications

## 2015-10-04 NOTE — Progress Notes (Signed)
Patient: Mitchell Morrow / Admit Date: 09/04/2015 / Date of Encounter: 10/27/2015, 9:24 AM   Subjective: He is still on for possible trach today. Has been in sinus rhythm since 1/2-13 with controlled HR on amiodarone gtt. No acute events.   Review of Systems: Review of Systems  Unable to perform ROS: intubated    Objective: Telemetry: sinus rhythm, converting 1/13 Physical Exam: Blood pressure 91/61, pulse 98, temperature 97.9 F (36.6 C), temperature source Oral, resp. rate 16, height 5\' 9"  (1.753 m), weight 637 lb (288.941 kg), SpO2 91 %. Body mass index is 94.03 kg/(m^2). General: Well developed, well nourished, in no acute distress. Head: Normocephalic, atraumatic, sclera non-icteric, no xanthomas, nares are without discharge. Neck: Negative for carotid bruits. JVP not elevated. Lungs: Decreased breath sounds bilaterally with coarse rhonchi. Intubated. Heart: RRR S1 S2 without murmurs, rubs, or gallops.  Abdomen: Obese, soft, non-tender, non-distended with normoactive bowel sounds. No rebound/guarding. Extremities: No clubbing or cyanosis. 2+ pitting edema bilaterally with chronic woody appearance.  Neuro: Intubated. Psych:  Intubated.   Intake/Output Summary (Last 24 hours) at 2015/10/27 0924 Last data filed at 10/27/2015 0700  Gross per 24 hour  Intake 4378.13 ml  Output    850 ml  Net 3528.13 ml    Inpatient Medications:  . antiseptic oral rinse  7 mL Mouth Rinse 10 times per day  . budesonide  0.25 mg Nebulization 4 times per day  . ceFEPime (MAXIPIME) IV  2 g Intravenous 3 times per day  . chlorhexidine gluconate  15 mL Mouth Rinse BID  . enoxaparin (LOVENOX) injection  40 mg Subcutaneous Q12H  . famotidine  20 mg Per Tube BID  . feeding supplement (PRO-STAT SUGAR FREE 64)  30 mL Oral TID  . free water  200 mL Per Tube 3 times per day  . Influenza vac split quadrivalent PF  0.5 mL Intramuscular Tomorrow-1000  . ipratropium-albuterol  3 mL Nebulization Q6H  .  metoprolol  5 mg Intravenous 4 times per day  . nystatin   Topical BID  . senna-docusate  1 tablet Oral QHS  . sodium chloride  3 mL Intravenous Q12H  . sodium chloride  3 mL Intravenous Q12H  . vancomycin  1,500 mg Intravenous Q12H   Infusions:  . sodium chloride 50 mL/hr at 10/03/15 0600  . amiodarone 30 mg/hr (2015/10/27 0700)  . feeding supplement (VITAL HIGH PROTEIN) Stopped (10/27/15 0006)  . fentaNYL infusion INTRAVENOUS 200 mcg/hr (10/27/15 0700)    Labs:  Recent Labs  10/03/15 0434 10-27-15 0607  NA 144 143  K 4.1 4.4  CL 104 104  CO2 33* 31  GLUCOSE 129* 119*  BUN 82* 99*  CREATININE 1.56* 2.03*  CALCIUM 8.5* 8.5*   No results for input(s): AST, ALT, ALKPHOS, BILITOT, PROT, ALBUMIN in the last 72 hours.  Recent Labs  10/02/15 0305 10/27/2015 0607  WBC 12.3* 10.3  HGB 11.3* 11.4*  HCT 37.3* 38.5*  MCV 84.0 86.4  PLT 154 137*   No results for input(s): CKTOTAL, CKMB, TROPONINI in the last 72 hours. Invalid input(s): POCBNP No results for input(s): HGBA1C in the last 72 hours.   Weights: Filed Weights   10/02/15 0726 10/03/15 0216 Oct 27, 2015 0500  Weight: 630 lb 12.8 oz (286.129 kg) 631 lb 6.4 oz (286.4 kg) 637 lb (288.941 kg)     Radiology/Studies:  Dg Chest 1 View  09/27/2015  CLINICAL DATA:  Shortness of breath. EXAM: CHEST 1 VIEW COMPARISON:  09/26/2015. FINDINGS: Endotracheal  tube, left IJ line, NG tube in stable position . The cardiomegaly with pulmonary vascular prominence and bilateral pulmonary infiltrates consistent with pulmonary edema, no significant interim change. Right mid lung and basilar atelectasis. No pleural effusion or pneumothorax. IMPRESSION: 1. Lines and tubes in stable position. 2. Cardiomegaly with pulmonary vascular prominence and persistent bilateral pulmonary infiltrates consistent pulmonary edema. No significant interim change. 3. Lung volumes with persistent basilar atelectasis . Electronically Signed   By: Maisie Fushomas  Register   On:  09/27/2015 08:02   Dg Chest 1 View  09/26/2015  CLINICAL DATA:  Dyspnea, morbid obesity. EXAM: CHEST 1 VIEW COMPARISON:  Chest x-rays dated 09/25/2015 and 09/24/2015. FINDINGS: Endotracheal tube appears well positioned with tip just above the level of the carina. Enteric tube passes below the diaphragm. Left IJ central line remains adequately positioned with tip projected over the upper SVC. Exam otherwise limited, as previously described. There is grossly stable cardiomegaly. Overall cardiomediastinal silhouette appears stable in size and configuration. Central pulmonary vascular congestion and bilateral interstitial edema persists. Left lung base difficult to evaluate, suspect atelectasis and/or effusion at the left lung base. IMPRESSION: 1. Stable chest x-ray appearance. Cardiomegaly with central pulmonary vascular congestion and bilateral interstitial edema, not significantly changed, suggesting continued mild volume overload/CHF. 2. Left lung base difficult to evaluate. Suspect atelectasis and/or small effusion at the left lung base. If febrile, left lower lobe pneumonia could not be excluded. 3.  Tubes and lines remain adequately positioned. Electronically Signed   By: Bary RichardStan  Maynard M.D.   On: 09/26/2015 09:03   Dg Ankle 2 Views Right  09/27/2015  CLINICAL DATA:  Injury.  Possible fracture. EXAM: RIGHT ANKLE - 2 VIEW COMPARISON:  None. FINDINGS: The mineralization and alignment are normal. There is no evidence of acute fracture or dislocation. The joint spaces appear adequately maintained. There are extensive soft tissue calcifications within the lower leg, suggesting underlying connective tissue disorder or venous stasis. IMPRESSION: No acute osseous findings. Electronically Signed   By: Carey BullocksWilliam  Veazey M.D.   On: 09/27/2015 17:30   Dg Abd 1 View  09/25/2015  CLINICAL DATA:  Check gastric catheter placement EXAM: ABDOMEN - 1 VIEW COMPARISON:  09/20/2015 FINDINGS: Gastric catheter extends into the mid  stomach. No other diagnostic information can be gleaned from this examination. IMPRESSION: Gastric catheter in mid stomach Electronically Signed   By: Alcide CleverMark  Lukens M.D.   On: 09/25/2015 18:55   Dg Chest Port 1 View  10/03/2015  CLINICAL DATA:  Respiratory failure, smoker, chronic systolic CHF, atrial fibrillation EXAM: PORTABLE CHEST 1 VIEW COMPARISON:  Portable exam 0546 hours compared to 10/02/2015 FINDINGS: Tip of endotracheal tube projects 3.5 cm above carina. Nasogastric tube extends to at least the mid chest, distal and stent obscured by light technique. LEFT jugular central venous catheter tip projects over SVC. Enlargement of cardiac silhouette with pulmonary vascular congestion. Motion artifacts degrade assessment of lung bases. Questionable perihilar infiltrate versus edema. Bibasilar atelectasis. No gross pneumothorax. IMPRESSION: Enlargement of cardiac silhouette with pulmonary vascular congestion. Bibasilar atelectasis with question mild pulmonary edema. Electronically Signed   By: Ulyses SouthwardMark  Boles M.D.   On: 10/03/2015 09:38   Dg Chest Port 1 View  10/02/2015  CLINICAL DATA:  Hypoxia this morning. Check endotracheal tube position. EXAM: PORTABLE CHEST 1 VIEW COMPARISON:  09/27/2015 FINDINGS: Patient is moderately rotated to the left. Endotracheal tube approximately 3.2 cm above the carina. Left IJ central venous catheter unchanged. Nasogastric tube courses into the region of the stomach and off  the inferior portion of the film as tip is not visualized. Lungs are hypoinflated with persistent prominence of the perihilar markings suggesting mild interstitial edema unchanged. Persistent left base opacification likely effusion with atelectasis although cannot exclude infection. Stable cardiomegaly. Remainder the exam is unchanged. IMPRESSION: Hypoinflation with stable left base opacification likely effusions/atelectasis although cannot exclude infection. Stable cardiomegaly and mild interstitial edema  unchanged. Tubes and lines unchanged. Electronically Signed   By: Elberta Fortis M.D.   On: 10/02/2015 10:20   Dg Chest Port 1 View  09/25/2015  CLINICAL DATA:  Difficulty with intubation. Morbid obesity. History of respiratory failure. EXAM: PORTABLE CHEST 1 VIEW COMPARISON:  Chest x-rays dated 09/24/2015 and 09/23/2015. FINDINGS: Endotracheal tube appears well positioned with tip approximately 2 cm above the carina. Left IJ central line remains adequately positioned with tip at the upper margin of the SVC. Enteric tube appears to pass below the diaphragm but its inferior portion is not clearly seen. Remainder of the exam is limited. There is grossly stable cardiomegaly. There is at least mild central pulmonary vascular congestion and bilateral interstitial edema, not significantly changed. IMPRESSION: Stable chest x-ray. Endotracheal tube appears well positioned with tip just above the level of the carina. Cardiomegaly with central pulmonary vascular congestion and bilateral interstitial edema, unchanged, suggesting continued mild volume overload/CHF. Electronically Signed   By: Bary Richard M.D.   On: 09/25/2015 16:18   Dg Chest Port 1 View  09/24/2015  CLINICAL DATA:  Respiratory failure. EXAM: PORTABLE CHEST 1 VIEW COMPARISON:  Chest x-rays from 08/30/2015 to 09/23/2015 FINDINGS: Endotracheal tube, central line, and OG tube appear unchanged. Persistent increased density at both lung bases, most likely pleural effusions and atelectasis. Fluid along the fissures in the right hemi thorax. New linear area of atelectasis in the right lung apex. Cardiomegaly with enlargement of the main pulmonary artery. IMPRESSION: New linear atelectasis in the right lung apex. Persistent bibasilar atelectasis and effusions. Electronically Signed   By: Francene Boyers M.D.   On: 09/24/2015 07:53   Dg Chest Port 1 View  09/23/2015  CLINICAL DATA:  Respiratory failure, pneumonia, CHF, acute encephalopathy, current smoker. EXAM:  PORTABLE CHEST 1 VIEW COMPARISON:  Portable chest x-ray of September 21, 2014 FINDINGS: The lungs remain mildly hypoinflated. The cardiac silhouette remains enlarged. The pulmonary vascularity remains engorged. The retrocardiac region remains dense and the left hemidiaphragm is nearly totally obscured. Hazy alveolar opacity in the right lung persists. The endotracheal tube tip lies 6 cm above the carina. The esophagogastric tube tip projects below the inferior margin of the image. The left internal jugular venous catheter tip projects over the proximal SVC. IMPRESSION: CHF with pulmonary interstitial and mild alveolar edema. Persistent left lower lobe atelectasis or pneumonia. There has not been significant interval change since the previous study. The support tubes are in reasonable position. Electronically Signed   By: David  [place] M.D.   On: 09/23/2015 07:39   Dg Chest Port 1 View  09/22/2015  CLINICAL DATA:  Respiratory failure, history of CHF, acute encephalopathy, current smoker. EXAM: PORTABLE CHEST 1 VIEW COMPARISON:  Portable chest x-ray of September 20, 2015 FINDINGS: The lungs are slightly better inflated today. This may be at least in part due to differences in patient positioning. The pulmonary interstitial markings remain increased especially on the right. The retrocardiac region on the left remains dense. The cardiac silhouette remains enlarged. The pulmonary vascularity remains engorged. The endotracheal tube tip lies 4.4 cm above the carina. The left internal jugular  venous catheter tip projects over the proximal portion of the SVC. The esophagogastric tube tip projects below the inferior margin of the image. IMPRESSION: CHF with pulmonary interstitial edema. Left lower lobe atelectasis or pneumonia. Overall there has not been significant interval change since the previous study. The support tubes are in reasonable position. Electronically Signed   By: David  [place] M.D.   On: 09/22/2015 07:54   Dg  Chest Port 1 View  09/20/2015  CLINICAL DATA:  40 year old male with endotracheal tube, NG tube and central line placement. Shortness of breath. EXAM: PORTABLE CHEST 1 VIEW COMPARISON:  09/11/2015 FINDINGS: An endotracheal tube with tip 2.7 cm above the carina, left IJ central venous catheter extending over the mediastinum but tip very difficult to identify, and NG tube entering the stomach with tip off the field of view. Cardiomegaly, bilateral airspace disease/atelectasis and pulmonary vascular congestion again noted. There is no evidence of pneumothorax. IMPRESSION: Support apparatus as described with endotracheal tube tip 2.7 cm above the carina and NG tube entering the stomach. Left IJ central venous catheter tip very difficult to identify. No evidence of pneumothorax. Cardiomegaly, bilateral airspace disease/ atelectasis in pulmonary vascular congestion again noted. Electronically Signed   By: Harmon Pier M.D.   On: 09/20/2015 07:16   Dg Chest Portable 1 View  09/08/2015  CLINICAL DATA:  40 year old male with swelling in the legs and under the arms. EXAM: PORTABLE CHEST 1 VIEW COMPARISON:  No priors. FINDINGS: Lung volumes are low. Ill-defined opacities throughout the right mid to lower lung, concerning for probable multilobar bronchopneumonia. Left lung appears clear, although assessment is severely limited by the patient's large body habitus. No definite pleural effusions. Heart size is moderately enlarged. Cephalization of the pulmonary vasculature. Upper mediastinal contours are within normal limits allowing for the lordotic positioning and patient's slight rotation to the left. IMPRESSION: 1. Findings concerning for multilobar bronchopneumonia involving the right middle and lower lobes. Followup PA and lateral chest X-ray is recommended in 3-4 weeks following trial of antibiotic therapy to ensure resolution and exclude underlying malignancy. 2. Moderate cardiomegaly with pulmonary venous congestion,  but no frank pulmonary edema. Electronically Signed   By: Trudie Reed M.D.   On: 09/18/2015 22:45   Dg Abd Portable 1v  09/20/2015  CLINICAL DATA:  40 year old male with NG tube placement. Subsequent encounter. EXAM: PORTABLE ABDOMEN - 1 VIEW COMPARISON:  09/20/2015 chest x-ray. FINDINGS: Exam limited by patient's habitus. Nasogastric tube can only be seen to the level of the gastroesophageal junction. Further evaluation limited because of habitus and motion. IMPRESSION: Nasogastric tube can only be seen to the level of the gastroesophageal junction. Further evaluation limited because of habitus and motion. Electronically Signed   By: Lacy Duverney M.D.   On: 09/20/2015 09:22   Dg Foot 2 Views Right  09/27/2015  CLINICAL DATA:  Pain following trauma EXAM: RIGHT FOOT - 2 VIEW COMPARISON:  None. FINDINGS: Frontal and lateral views were obtained. There is soft tissue edema. There is no demonstrable fracture or dislocation. Joint spaces appear intact. No erosive change. IMPRESSION: Generalized soft tissue swelling. No fracture or dislocation. No appreciable arthropathy. Electronically Signed   By: Bretta Bang III M.D.   On: 09/27/2015 17:28   US Abdomen Limited Ruq  09/18/2015  CLINICAL DATA:  Hyperbilirubinemia.  Morbid obesity. EXAM: US ABDOMEN LIMITED - RIGHT UPPER QUADRANT COMPARISON:  None. FINDINGS: Gallbladder: Gallbladder wall is thickened, 3.6 mm. No sonographic Murphy sign. No gallbladder %%%%%% a no gallstones identified.  No pericholecystic fluid. Common bile duct: Diameter: 5.0 mm Liver: Liver contour is nodular. No focal liver lesions are identified. Liver echotexture is mildly heterogeneous. Additional: Right small right pleural effusion. Study quality is degraded by patient body habitus and limited patient mobility. IMPRESSION: 1. Nodular contour the liver raising the question of cirrhosis. 2. Gallbladder wall thickening, nonspecific in appears. 3. Small right pleural effusion.  Electronically Signed   By: Norva Pavlov M.D.   On: 09/18/2015 10:14     Assessment and Plan   1. Acute respiratory failure with hypercapnia: -Possible trach 1/16 -Likely multifactorial in the setting of PNA, acute on chronic systolic and diastolic CHF  2. Acute on chronic systolic CHF: -EF of 45%, likely secondary to OSA/obesity hypoventilation -Has been needing Lasix infusion given his soft BP, will defer to renal in this case -Rhythm control with amiodarone  3. New onset Afib: -In sinus rhythm as of 1/12-1/3 -Would discontinue amiodarone gtt and change to po amiodarone 400 mg bid x 1 week, 200 mg bid x 1 week, 200 mg daily thereafter -He remains at high risk of arrhythmia given the above -If he converts back to atrial fibrillation, would re-load amiodarone -Continue metoprolol, though hypotensive at times -CHADSVASc at least 1 (CHF) -Risks of long term full dose anticoagulation out weigh benefit at this time  4. PNA with septic shock: -Has completed ABX -Per IM and PCCM   Signed, Eula Listen, PA-C Pager: [phone] 09/26/2015, 9:24 AM

## 2015-10-04 NOTE — Anesthesia Preprocedure Evaluation (Signed)
Anesthesia Evaluation  Patient identified by MRN, date of birth, ID band Patient unresponsive    Reviewed: Allergy & Precautions, NPO status   Airway Mallampati: Intubated       Dental   Pulmonary pneumonia, unresolved, Current Smoker,     + decreased breath sounds      Cardiovascular Exercise Tolerance: Poor +CHF   Rhythm:Regular     Neuro/Psych    GI/Hepatic   Endo/Other  Morbid obesity  Renal/GU      Musculoskeletal   Abdominal (+) + obese,   Peds  Hematology   Anesthesia Other Findings   Reproductive/Obstetrics                             Anesthesia Physical Anesthesia Plan  ASA: IV  Anesthesia Plan: General   Post-op Pain Management:    Induction: Inhalational  Airway Management Planned: Tracheostomy and Oral ETT  Additional Equipment:   Intra-op Plan:   Post-operative Plan:   Informed Consent: I have reviewed the patients History and Physical, chart, labs and discussed the procedure including the risks, benefits and alternatives for the proposed anesthesia with the patient or authorized representative who has indicated his/her understanding and acceptance.   Consent reviewed with POA  Plan Discussed with: CRNA and Surgeon  Anesthesia Plan Comments: (289 kg. Hx of CHF, cocaine abuse. Morbid obesity--BMI 77. Intubated, ventilated, for tracheostomy today.)        Anesthesia Quick Evaluation

## 2015-10-05 ENCOUNTER — Encounter: Payer: Self-pay | Admitting: Otolaryngology

## 2015-10-05 ENCOUNTER — Inpatient Hospital Stay: Payer: Medicaid Other

## 2015-10-05 DIAGNOSIS — Z4659 Encounter for fitting and adjustment of other gastrointestinal appliance and device: Secondary | ICD-10-CM

## 2015-10-05 LAB — BASIC METABOLIC PANEL
ANION GAP: 10 (ref 5–15)
BUN: 95 mg/dL — ABNORMAL HIGH (ref 6–20)
CO2: 28 mmol/L (ref 22–32)
Calcium: 8.6 mg/dL — ABNORMAL LOW (ref 8.9–10.3)
Chloride: 102 mmol/L (ref 101–111)
Creatinine, Ser: 2.83 mg/dL — ABNORMAL HIGH (ref 0.61–1.24)
GFR calc Af Amer: 30 mL/min — ABNORMAL LOW (ref >60)
GFR, EST NON AFRICAN AMERICAN: 26 mL/min — AB (ref >60)
GLUCOSE: 121 mg/dL — AB (ref 65–99)
POTASSIUM: 4.8 mmol/L (ref 3.5–5.1)
Sodium: 140 mmol/L (ref 135–145)

## 2015-10-05 LAB — BLOOD GAS, ARTERIAL
ALLENS TEST (PASS/FAIL): POSITIVE — AB
Acid-Base Excess: 2 mmol/L (ref 0.0–3.0)
Bicarbonate: 30 mEq/L — ABNORMAL HIGH (ref 21.0–28.0)
FIO2: 0.6
MECHANICAL RATE: 25
O2 SAT: 93.5 %
PCO2 ART: 61 mmHg — AB (ref 32.0–48.0)
PEEP: 8 cmH2O
Patient temperature: 37
VT: 500 mL
pH, Arterial: 7.3 — ABNORMAL LOW (ref 7.350–7.450)
pO2, Arterial: 76 mmHg — ABNORMAL LOW (ref 83.0–108.0)

## 2015-10-05 LAB — GLUCOSE, CAPILLARY
GLUCOSE-CAPILLARY: 120 mg/dL — AB (ref 65–99)
GLUCOSE-CAPILLARY: 121 mg/dL — AB (ref 65–99)
GLUCOSE-CAPILLARY: 106 mg/dL — AB (ref 65–99)
GLUCOSE-CAPILLARY: 111 mg/dL — AB (ref 65–99)

## 2015-10-05 LAB — CBC
HEMATOCRIT: 38.7 % — AB (ref 40.0–52.0)
Hemoglobin: 11.6 g/dL — ABNORMAL LOW (ref 13.0–18.0)
MCH: 25.8 pg — AB (ref 26.0–34.0)
MCHC: 29.8 g/dL — AB (ref 32.0–36.0)
MCV: 86.4 fL (ref 80.0–100.0)
PLATELETS: 137 10*3/uL — AB (ref 150–440)
RBC: 4.48 MIL/uL (ref 4.40–5.90)
RDW: 21.8 % — AB (ref 11.5–14.5)
WBC: 10.7 10*3/uL — ABNORMAL HIGH (ref 3.8–10.6)

## 2015-10-05 MED ORDER — PRO-STAT SUGAR FREE PO LIQD
30.0000 mL | Freq: Three times a day (TID) | ORAL | Status: DC
  Administered 2015-10-05 – 2015-10-18 (×40): 30 mL

## 2015-10-05 MED ORDER — VITAL HIGH PROTEIN PO LIQD
1000.0000 mL | ORAL | Status: DC
  Administered 2015-10-05: 1000 mL
  Administered 2015-10-05: 20:00:00
  Administered 2015-10-06: 1000 mL
  Administered 2015-10-06 (×2)
  Administered 2015-10-07 – 2015-10-11 (×5): 1000 mL
  Administered 2015-10-12: 07:00:00
  Administered 2015-10-12: 1000 mL

## 2015-10-05 MED ORDER — ALBUMIN HUMAN 25 % IV SOLN
25.0000 g | Freq: Two times a day (BID) | INTRAVENOUS | Status: DC
  Administered 2015-10-05 – 2015-10-09 (×9): 25 g via INTRAVENOUS
  Filled 2015-10-05 (×11): qty 100

## 2015-10-05 MED ORDER — FUROSEMIDE 10 MG/ML IJ SOLN
5.0000 mg/h | INTRAMUSCULAR | Status: DC
  Administered 2015-10-05: 5 mg/h via INTRAVENOUS
  Filled 2015-10-05: qty 25

## 2015-10-05 MED ORDER — SENNOSIDES 8.8 MG/5ML PO SYRP
5.0000 mL | ORAL_SOLUTION | Freq: Two times a day (BID) | ORAL | Status: DC
  Administered 2015-10-05 (×2): 5 mL via ORAL
  Administered 2015-10-06: 1 mL via ORAL
  Administered 2015-10-06 – 2015-10-11 (×11): 5 mL via ORAL
  Filled 2015-10-05 (×14): qty 5

## 2015-10-05 MED ORDER — FUROSEMIDE 10 MG/ML IJ SOLN: 40.0000 mg | Freq: Two times a day (BID) | INTRAMUSCULAR | Status: DC

## 2015-10-05 MED ORDER — FREE WATER
30.0000 mL | Status: DC
  Administered 2015-10-05 – 2015-10-18 (×79): 30 mL

## 2015-10-05 NOTE — Progress Notes (Signed)
[facility]  ROUNDING NOTE   Subjective:  Were asked to see the patient again in consultation. Renal function has been worsening since the 12th. Creatinine currently up to 2.83 with a BUN of 95. Urine output has also been quite low today. He was given a dose of Lasix earlier today without significant output. Hypotension has also been present. Respiratory acidosis also continues to exist. In the interim he's had a tracheostomy placed.   Objective:  Vital signs in last 24 hours:  Temp:  [98.2 F (36.8 C)-99 F (37.2 C)] 98.2 F (36.8 C) (01/17 1117) Pulse Rate:  [94-110] 102 (01/17 1200) Resp:  [0-25] 14 (01/17 1200) BP: (89-100)/(47-70) 89/70 mmHg (01/17 1200) SpO2:  [89 %-100 %] 97 % (01/17 1200) FiO2 (%):  [45 %-70 %] 70 % (01/17 1345) Weight:  [287.807 kg (634 lb 8 oz)] 287.807 kg (634 lb 8 oz) (01/17 0500)  Weight change: -1.134 kg (-2 lb 8 oz) Filed Weights   10/03/15 0216 09/19/2015 0500 10/05/15 0500  Weight: 286.4 kg (631 lb 6.4 oz) 288.941 kg (637 lb) 287.807 kg (634 lb 8 oz)    Intake/Output: I/O last 3 completed shifts: In: 4768.3 [I.V.:3212.3; NG/GT:306; IV Piggyback:1250] Out: 695 [Urine:675; Blood:20]   Intake/Output this shift:  Total I/O In: 1010 [I.V.:510; IV Piggyback:500] Out: 25 [Urine:25]  Physical Exam: General: Critically ill  Head: [place]/AT difficult to assess hearing  Eyes: Mild icterus noted  Neck: Tracheostomy in place  Lungs:  Difficult exam, air entry present, vent assisted, fio2 70%  Heart: S1S2 no rubs  Abdomen:  Soft, nontender, obese  Extremities: 2+ generalized edema.  Neurologic: Awake but not following commands  Skin: No lesions  GU: foley    Basic Metabolic Panel:  Recent Labs Lab 10/01/15 0426 10/01/15 1426 10/02/15 0305 10/03/15 0434 10/02/2015 0607 10/05/15 0532  NA 137  --  143 144 143 140  K 3.9  --  3.9 4.1 4.4 4.8  CL 99*  --  103 104 104 102  CO2 33*  --  36* 33* 31 28  GLUCOSE 120*  --  119*  129* 119* 121*  BUN 70*  --  79* 82* 99* 95*  CREATININE 1.60* 1.57* 1.50* 1.56* 2.03* 2.83*  CALCIUM 8.2*  --  8.4* 8.5* 8.5* 8.6*    Liver Function Tests: No results for input(s): AST, ALT, ALKPHOS, BILITOT, PROT, ALBUMIN in the last 168 hours. No results for input(s): LIPASE, AMYLASE in the last 168 hours. No results for input(s): AMMONIA in the last 168 hours.  CBC:  Recent Labs Lab 09/30/15 0447 10/01/15 0426 10/02/15 0305 09/29/2015 0607 10/05/15 0532  WBC 12.2* 11.5* 12.3* 10.3 10.7*  HGB 11.8* 11.8* 11.3* 11.4* 11.6*  HCT 38.7* 38.7* 37.3* 38.5* 38.7*  MCV 81.8 83.7 84.0 86.4 86.4  PLT 160 170 154 137* 137*    Cardiac Enzymes: No results for input(s): CKTOTAL, CKMB, CKMBINDEX, TROPONINI in the last 168 hours.  BNP: Invalid input(s): POCBNP  CBG:  Recent Labs Lab 10/03/15 2335 10/13/2015 0715 09/29/2015 1611 10/05/15 0122 10/05/15 0723  GLUCAP 128* 123* 122* 120* 121*    Microbiology: Results for orders placed or performed during the hospital encounter of 09/09/2015  Blood culture (routine x 2)     Status: None   Collection Time: 08/31/2015 11:53 PM  Result Value Ref Range Status   Specimen Description BLOOD LEFT HAND  Final   Special Requests BOTTLES DRAWN AEROBIC AND ANAEROBIC 4CC  Final   Culture NO GROWTH  5 DAYS  Final   Report Status 09/23/2015 FINAL  Final  Blood culture (routine x 2)     Status: None   Collection Time: 08/25/2015 11:53 PM  Result Value Ref Range Status   Specimen Description BLOOD LEFT ARM  Final   Special Requests BOTTLES DRAWN AEROBIC AND ANAEROBIC 5CC  Final   Culture NO GROWTH 5 DAYS  Final   Report Status 09/23/2015 FINAL  Final  Urine culture     Status: None   Collection Time: 09/18/15 12:21 AM  Result Value Ref Range Status   Specimen Description URINE, RANDOM  Final   Special Requests NONE  Final   Culture MULTIPLE SPECIES PRESENT, SUGGEST RECOLLECTION  Final   Report Status 09/19/2015 FINAL  Final  Rapid Influenza A&B  Antigens ([facility] only)     Status: None   Collection Time: 09/18/15  6:26 AM  Result Value Ref Range Status   Influenza A ([facility]) NOT DETECTED  Final   Influenza B ([facility]) NOT DETECTED  Final  MRSA PCR Screening     Status: None   Collection Time: 09/20/15  6:30 AM  Result Value Ref Range Status   MRSA by PCR NEGATIVE NEGATIVE Final    Comment:        The GeneXpert MRSA Assay (FDA approved for NASAL specimens only), is one component of a comprehensive MRSA colonization surveillance program. It is not intended to diagnose MRSA infection nor to guide or monitor treatment for MRSA infections.   Culture, respiratory (NON-Expectorated)     Status: None   Collection Time: 09/22/15  3:13 PM  Result Value Ref Range Status   Specimen Description TRACHEAL ASPIRATE  Final   Special Requests NONE  Final   Gram Stain   Final    GOOD SPECIMEN - 80-90% WBCS MODERATE WBC SEEN RARE YEAST RARE GRAM NEGATIVE COCCOBACILLI    Culture LIGHT GROWTH CANDIDA TROPICALIS  Final   Report Status 09/26/2015 FINAL  Final  Urine culture     Status: None   Collection Time: 09/22/15 11:16 PM  Result Value Ref Range Status   Specimen Description URINE, RANDOM  Final   Special Requests NONE  Final   Culture NO GROWTH 2 DAYS  Final   Report Status 09/24/2015 FINAL  Final  CULTURE, BLOOD (ROUTINE X 2) w Reflex to PCR ID Panel     Status: None   Collection Time: 09/22/15 11:44 PM  Result Value Ref Range Status   Specimen Description BLOOD RIGHT ANTECUBITAL  Final   Special Requests BOTTLES DRAWN AEROBIC AND ANAEROBIC  Final   Culture NO GROWTH 5 DAYS  Final   Report Status 09/28/2015 FINAL  Final  CULTURE, BLOOD (ROUTINE X 2) w Reflex to PCR ID Panel     Status: None   Collection Time: 09/22/15 11:56 PM  Result Value Ref Range Status   Specimen Description BLOOD LEFT HAND  Final   Special Requests BOTTLES DRAWN AEROBIC AND ANAEROBIC  Final   Culture NO GROWTH 5 DAYS  Final   Report Status 09/28/2015  FINAL  Final  Culture, respiratory (NON-Expectorated)     Status: None   Collection Time: 09/23/15 12:01 AM  Result Value Ref Range Status   Specimen Description TRACHEAL ASPIRATE  Final   Special Requests NONE  Final   Gram Stain   Final    GOOD SPECIMEN - 80-90% WBCS MODERATE WBC SEEN RARE YEAST RARE GRAM POSITIVE COCCI    Culture Consistent with normal  respiratory flora.  Final   Report Status 09/25/2015 FINAL  Final  Urine culture     Status: None   Collection Time: 10/01/15 10:35 AM  Result Value Ref Range Status   Specimen Description URINE, RANDOM  Final   Special Requests NONE  Final   Culture NO GROWTH 2 DAYS  Final   Report Status 10/03/2015 FINAL  Final  CULTURE, BLOOD (ROUTINE X 2) w Reflex to PCR ID Panel     Status: None (Preliminary result)   Collection Time: 10/01/15 10:56 AM  Result Value Ref Range Status   Specimen Description BLOOD RIGHT ARM  Final   Special Requests   Final    BOTTLES DRAWN AEROBIC AND ANAEROBIC  AER 3CC ANA 5CC   Culture NO GROWTH 4 DAYS  Final   Report Status PENDING  Incomplete  CULTURE, BLOOD (ROUTINE X 2) w Reflex to PCR ID Panel     Status: None (Preliminary result)   Collection Time: 10/01/15 11:14 AM  Result Value Ref Range Status   Specimen Description BLOOD LEFT HAND  Final   Special Requests   Final    BOTTLES DRAWN AEROBIC AND ANAEROBIC  AER 3CC ANA 4CC   Culture  Setup Time   Final    GRAM POSITIVE COCCI ANAEROBIC BOTTLE ONLY CRITICAL RESULT CALLED TO, READ BACK BY AND VERIFIED WITH: CRYSTAL SCARPENA AT 0915 ON 10/02/15 CTJ    Culture   Final    COAGULASE NEGATIVE STAPHYLOCOCCUS ANAEROBIC BOTTLE ONLY Results consistent with contamination.    Report Status PENDING  Incomplete  Blood Culture ID Panel (Reflexed)     Status: Abnormal   Collection Time: 10/01/15 11:14 AM  Result Value Ref Range Status   Enterococcus species NOT DETECTED NOT DETECTED Final   Listeria monocytogenes NOT DETECTED NOT DETECTED Final    Staphylococcus species DETECTED (A) NOT DETECTED Final   Staphylococcus aureus NOT DETECTED NOT DETECTED Final   Streptococcus species NOT DETECTED NOT DETECTED Final   Streptococcus agalactiae NOT DETECTED NOT DETECTED Final   Streptococcus pneumoniae NOT DETECTED NOT DETECTED Final   Streptococcus pyogenes NOT DETECTED NOT DETECTED Final   Acinetobacter baumannii NOT DETECTED NOT DETECTED Final   Enterobacteriaceae species NOT DETECTED NOT DETECTED Final   Enterobacter cloacae complex NOT DETECTED NOT DETECTED Final   Escherichia coli NOT DETECTED NOT DETECTED Final   Klebsiella oxytoca NOT DETECTED NOT DETECTED Final   Klebsiella pneumoniae NOT DETECTED NOT DETECTED Final   Proteus species NOT DETECTED NOT DETECTED Final   Serratia marcescens NOT DETECTED NOT DETECTED Final   Haemophilus influenzae NOT DETECTED NOT DETECTED Final   Neisseria meningitidis NOT DETECTED NOT DETECTED Final   Pseudomonas aeruginosa NOT DETECTED NOT DETECTED Final   Candida albicans NOT DETECTED NOT DETECTED Final   Candida glabrata NOT DETECTED NOT DETECTED Final   Candida krusei NOT DETECTED NOT DETECTED Final   Candida parapsilosis NOT DETECTED NOT DETECTED Final   Candida tropicalis NOT DETECTED NOT DETECTED Final   Carbapenem resistance NOT DETECTED NOT DETECTED Final   Methicillin resistance DETECTED (A) NOT DETECTED Final    Comment: CRITICAL RESULT CALLED TO, READ BACK BY AND VERIFIED WITH: CRYSTAL SCARPENA FOR STAPHYLOCOCCUS SPECIES AND METHICILLIN RESISTANCE AT 0915 ON 10/02/15 CTJ    Vancomycin resistance NOT DETECTED NOT DETECTED Final  Culture, expectorated sputum-assessment     Status: None   Collection Time: 10/01/15 11:51 AM  Result Value Ref Range Status   Specimen Description EXPECTORATED SPUTUM  Final  Special Requests Normal  Final   Sputum evaluation THIS SPECIMEN IS ACCEPTABLE FOR SPUTUM CULTURE  Final   Report Status 10/01/2015 FINAL  Final  Culture, respiratory  (NON-Expectorated)     Status: None (Preliminary result)   Collection Time: 10/01/15 11:51 AM  Result Value Ref Range Status   Specimen Description EXPECTORATED SPUTUM  Final   Special Requests Normal Reflexed from [id number]  Final   Gram Stain   Final    GOOD SPECIMEN - 80-90% WBCS MODERATE WBC SEEN FEW YEAST    Culture HOLDING FOR POSSIBLE PATHOGEN  Final   Report Status PENDING  Incomplete    Coagulation Studies: No results for input(s): LABPROT, INR in the last 72 hours.  Urinalysis: No results for input(s): COLORURINE, LABSPEC, PHURINE, GLUCOSEU, HGBUR, BILIRUBINUR, KETONESUR, PROTEINUR, UROBILINOGEN, NITRITE, LEUKOCYTESUR in the last 72 hours.  Invalid input(s): APPERANCEUR    Imaging: Dg Chest 1 View  10/05/2015  CLINICAL DATA:  Dyspnea. EXAM: CHEST 1 VIEW COMPARISON:  October 04, 2015. FINDINGS: Tracheostomy is in grossly good position. Hypoinflation of the lungs is noted. Left internal jugular catheter is noted with distal tip in expected position of SVC. Stable cardiomegaly. No pneumothorax or significant pleural effusion is noted. Slightly increased right upper lobe opacity is noted concerning for atelectasis or possibly pneumonia. Stable left upper lobe opacity is noted. Bony thorax is unremarkable. IMPRESSION: Right upper lobe opacity is noted which is slightly increased suggesting worsening pneumonia or atelectasis. Stable left upper lobe opacity is noted. Hypoinflation of the lungs is noted. Electronically Signed   By: Lupita Raider, M.D.   On: 10/05/2015 07:50   Dg Chest Port 1 View  10/13/2015  CLINICAL DATA:  Pt had trach placed this am; pt has been in hospital since December for multiple problems; morbidly obese, smoker, afib EXAM: PORTABLE CHEST 1 VIEW COMPARISON:  10/03/2015 FINDINGS: Tracheostomy tube projects over the tracheal air column with tip 4.5 cm above the carina. Left internal jugular central line tip projects over the superior vena cava. Study limited by  body habitus. Severe cardiac enlargement. Vascular congestion, moderate. Right lung otherwise clear. Patchy left upper lobe opacity consistent with infiltrate. IMPRESSION: Lines and tubes as described with severe cardiac enlargement. Vascular congestion persists. Focal left upper lobe airspace disease likely represents residual clearing pulmonary edema. Pneumonia not excluded and follow-up recommended. Electronically Signed   By: Esperanza Heir M.D.   On: 10/17/2015 18:43     Medications:   . sodium chloride 50 mL/hr at 10/05/15 0119  . amiodarone 30 mg/hr (10/05/15 0916)  . fentaNYL infusion INTRAVENOUS 150 mcg/hr (10/05/15 0958)   . antiseptic oral rinse  7 mL Mouth Rinse 10 times per day  . budesonide  0.25 mg Nebulization 4 times per day  . ceFEPime (MAXIPIME) IV  2 g Intravenous 3 times per day  . chlorhexidine gluconate  15 mL Mouth Rinse BID  . enoxaparin (LOVENOX) injection  40 mg Subcutaneous Q12H  . famotidine  20 mg Per Tube BID  . Influenza vac split quadrivalent PF  0.5 mL Intramuscular Tomorrow-1000  . ipratropium-albuterol  3 mL Nebulization Q6H  . metoprolol  5 mg Intravenous 4 times per day  . nystatin   Topical BID  . senna-docusate  1 tablet Oral QHS   acetaminophen **OR** acetaminophen, albuterol, bisacodyl, calcium carbonate, fentaNYL (SUBLIMAZE) injection, ibuprofen, LORazepam, ondansetron **OR** ondansetron (ZOFRAN) IV, sodium chloride  Assessment/ Plan:  Mr. Mitchell Morrow is a 40 y.o. black male with morbid obesity, tobacco  abuse, without prescribed home medications, who was admitted to [facility] on 09/25/15, s/p tracheostomy placement, persistent respiratory failure, multiple episodes of ARF  1. Acute Renal Failure:  Second episode of ARF, reconsulted 10/05/15, hypotension contributing now.  -  The patient has recurrent acute renal failure.  Treatment will pose challenges.  His total body volume overloaded.  He appears to be in need of volume removal but this may  worsen his renal failure, in which case we may then need to consider temporary dialysis as he continues to receive additional forms of aggressive therapy.  For now start lasix gtt at  IV per 24 hours.  Will also  Add albumin 25g IV BID for the next several days.    2. Acute respiratory failure with bilateral pneumonia and acute exacerbation of systolic congestive heart failure: Planning on lasix gtt as above, remains on the vent at present with fio2 of 70%.    3.  Generalized edema:  See above, starting lasix with albumin combination.   4.  Hypotension: consider pressors to maintain map of 65 or greater to maintain renal perfusion, but defer this to pulm/cc.    LOS: 18 Everette Dimauro 1/17/20172:06 PM

## 2015-10-05 NOTE — Consult Note (Signed)
Patient ID: Mitchell Morrow, male   DOB: [birth year], 40 y.o.   MRN: [id number]  CC: Respiratory Failure  HPI Mitchell Morrow is a 40 y.o. male who is currently admitted to the ICU, general surgery consult requested by Dr. Nemiah Commander to evaluate for possible surgical feeding tube placement. Patient with a significant history of super obesity, congestive heart failure, A. fib, respiratory failure. Patient currently requiring ventilator support due to the above. Patient had a nasoenteric tube placed earlier today and was instituted on enteric feeds without any difficulty. Patient does not awaken during my consultation and there is no family at the bedside to answer any questions.  HPI  Past Medical History  Diagnosis Date  . Super obesity (HCC)   . Tobacco abuse   . Chronic systolic CHF (congestive heart failure) (HCC)   . Atrial fibrillation (HCC) 09/2015    Past Surgical History  Procedure Laterality Date  . Tracheostomy tube placement N/A 10/17/2015    Procedure: TRACHEOSTOMY;  Surgeon: Vernie Murders, MD;  Location: [facility] ORS;  Service: ENT;  Laterality: N/A;    Family History  Problem Relation Age of Onset  . Diabetes Other     Social History Social History  Substance Use Topics  . Smoking status: Current Every Day Smoker  . Smokeless tobacco: None  . Alcohol Use: No    No Known Allergies  Current Facility-Administered Medications  Medication Dose Route Frequency Provider Last Rate Last Dose  . acetaminophen (TYLENOL) tablet 650 mg  650 mg Oral Q6H PRN Milagros Loll, MD   650 mg at 10/01/15 2335   Or  . acetaminophen (TYLENOL) suppository 650 mg  650 mg Rectal Q6H PRN Srikar Sudini, MD      . albumin human 25 % solution 25 g  25 g Intravenous Q12H Munsoor Lateef, MD   25 g at 10/05/15 1603  . albuterol (PROVENTIL) (2.5 MG/3ML) 0.083% nebulizer solution 2.5 mg  2.5 mg Nebulization Q3H PRN Merwyn Katos, MD   2.5 mg at 09/27/15 0840  . amiodarone (NEXTERONE PREMIX) 360 MG/200ML (1.8  mg/mL) IV infusion  30 mg/hr Intravenous Continuous Marinus Maw, MD 16.7 mL/hr at 10/05/15 0916 30 mg/hr at 10/05/15 0916  . antiseptic oral rinse solution (CORINZ)  7 mL Mouth Rinse 10 times per day Erin Fulling, MD   7 mL at 10/05/15 1554  . bisacodyl (DULCOLAX) suppository 10 mg  10 mg Rectal Daily PRN Karl Ito, MD      . budesonide (PULMICORT) nebulizer solution 0.25 mg  0.25 mg Nebulization 4 times per day Merwyn Katos, MD   0.25 mg at 10/05/15 1344  . calcium carbonate (TUMS - dosed in mg elemental calcium) chewable tablet 200 mg of elemental calcium  1 tablet Oral TID PRN Wyatt Haste, MD   200 mg of elemental calcium at 09/20/15 0130  . chlorhexidine gluconate (PERIDEX) 0.12 % solution 15 mL  15 mL Mouth Rinse BID Erin Fulling, MD   15 mL at 10/05/15 0800  . enoxaparin (LOVENOX) injection 40 mg  40 mg Subcutaneous Q12H Erin Fulling, MD   40 mg at 10/05/15 1100  . famotidine (PEPCID) tablet 20 mg  20 mg Per Tube BID Erin Fulling, MD   20 mg at 10/01/2015 0952  . feeding supplement (PRO-STAT SUGAR FREE 64) liquid 30 mL  30 mL Per Tube TID Enid Baas, MD   30 mL at 10/05/15 1604  . feeding supplement (VITAL HIGH PROTEIN) liquid 1,000 mL  1,000 mL  Per Tube Q24H Enid Baas, MD   1,000 mL at 10/05/15 1553  . fentaNYL (SUBLIMAZE) injection 25-100 mcg  25-100 mcg Intravenous Q2H PRN Merwyn Katos, MD   100 mcg at 09/28/15 0054  . fentaNYL in NS (56mcg/ml) infusion-PREMIX  10 mcg/hr Intravenous Continuous Erin Fulling, MD 15 mL/hr at 10/05/15 0958 150 mcg/hr at 10/05/15 0958  . free water 30 mL  30 mL Per Tube 6 times per day Enid Baas, MD   30 mL at 10/05/15 1600  . furosemide (LASIX) 250 mg in dextrose 5 % 250 mL (1 mg/mL) infusion  5 mg/hr Intravenous Continuous Munsoor Lateef, MD 5 mL/hr at 10/05/15 1449 5 mg/hr at 10/05/15 1449  . ibuprofen (ADVIL,MOTRIN) tablet 600 mg  600 mg Oral Q8H PRN Adrian Saran, MD   600 mg at 10/01/15 1419  . Influenza vac split  quadrivalent PF (FLUARIX) injection 0.5 mL  0.5 mL Intramuscular Tomorrow-1000 Erin Fulling, MD   0.5 mL at 10/02/15 1028  . ipratropium-albuterol (DUONEB) 0.5-2.5 (3) MG/3ML nebulizer solution 3 mL  3 mL Nebulization Q6H Merwyn Katos, MD   3 mL at 10/05/15 1344  . LORazepam (ATIVAN) injection 2 mg  2 mg Intravenous Q1H PRN Erin Fulling, MD   2 mg at 10/21/2015 0557  . metoprolol (LOPRESSOR) injection 5 mg  5 mg Intravenous 4 times per day Adrian Saran, MD   5 mg at 10/05/15 0523  . nystatin (MYCOSTATIN/NYSTOP) topical powder   Topical BID Srikar Sudini, MD      . ondansetron [facility]) tablet 4 mg  4 mg Oral Q6H PRN Milagros Loll, MD       Or  . ondansetron (ZOFRAN) injection 4 mg  4 mg Intravenous Q6H PRN Milagros Loll, MD   4 mg at 09/20/15 0052  . senna-docusate (Senokot-S) tablet 1 tablet  1 tablet Oral QHS Bertram Savin, [facility]   1 tablet at 10/03/15 2141  . sennosides (SENOKOT) 8.8 MG/5ML syrup 5 mL  5 mL Oral BID Enid Baas, MD   5 mL at 10/05/15 1553  . sodium chloride 0.9 % injection 10-40 mL  10-40 mL Intracatheter PRN Erin Fulling, MD         Review of Systems A full ROS is impossible due to patient's current sedated state.  Physical Exam Blood pressure 96/66, pulse 100, temperature 99.3 F (37.4 C), temperature source Oral, resp. rate 25, height  (1.753 m), weight 287.807 kg (634 lb 8 oz), SpO2 94 %. CONSTITUTIONAL: Very large, weighing in bed in no acute distress. EYES: Pupils are equal, round,  EARS, NOSE, MOUTH AND THROAT: The oropharynx is clear. The oral mucosa is pink and moist. Tracheostomy in place  LYMPH NODES:  Lymph nodes in the neck are unable be palpated due to body habitus RESPIRATORY:  Lungs are distant in all quadrants. Patient is mechanically ventilated CARDIOVASCULAR: Heart is tachycardic and all sounds are distant. GI: The abdomen is super obese, soft, nontender, and nondistended. There are no palpable masses. There is no hepatosplenomegaly. There are  normal bowel sounds in all quadrants. GU: Rectal deferred.   MUSCULOSKELETAL: Normal muscle strength and tone. No cyanosis or edema.   SKIN: Turgor is good and there are no pathologic skin lesions or ulcers. NEUROLOGIC: Motor and sensation is grossly normal. Cranial nerves are grossly intact. PSYCH:  Oriented to person, place and time. Affect is normal.  Data Reviewed Labs and images reviewed. Labs with a mild leukocytosis, increasing creatinine and BUN  concerning for acute renal failure, arterial blood gas shows pH 7.30 pCO2 61 and pO2 76 which is consistent with hypercapnic respiratory failure there are no cross-sectional images to review I have personally reviewed the patient's imaging, laboratory findings and medical records.    Assessment    40 year old super obese male with hypercapnic respiratory failure    Plan    Consult was asked for possible surgical feeding tube access. Patient is unable to participate in exam and there is no family members present at this time. Patient currently tolerating nasoenteric feeds. Patient is an extremely high risk surgical candidate and a poor surgical candidate for any minimally invasive procedure secondary to his extremely large body habitus. In order to place a surgical feeding tube he would likely require an open procedure which would require a large midline incision and all the risks associated with that. Recommend consult with interventional radiology to see whether not a percutaneous access is possible per radiology. Would not recommend surgical intervention unless absolutely necessary. Surgery will not continue to follow, please call if we can be of any further assistance     Time spent with the patient was 45 minutes, with more than 50% of the time spent in face-to-face education, counseling and care coordination.     Ricarda Frame, MD FACS General Surgeon 10/05/2015, 5:26 PM

## 2015-10-05 NOTE — Progress Notes (Signed)
Nutrition Follow-up   INTERVENTION:   Coordination of Care: discsused nutritional poc during ICU rounds; GI and Surgery have been consulted for feeding tube placement. Pt currently does not have NG post trach, OG removed during trach placement. If unable to place permanent feeding tube in a day or so, recommend considering NG placement for nutrition and meds. Continue to assess  NUTRITION DIAGNOSIS:   Inadequate oral intake related to inability to eat as evidenced by NPO status.  GOAL:   Patient will meet greater than or equal to 90% of their needs  MONITOR:    (Energy Intake, Digestive System, Pulmonary Profile, Electrolyte and Renal Profile, Anthropometrics)  REASON FOR ASSESSMENT:   Ventilator, Consult Enteral/tube feeding initiation and management  ASSESSMENT:   Pt admitted with acute hypoxic and hypercapnic resp failure with acute metabolic encephalopathy from cocaine abuse leading to CHF and encephalopathy per MD note. Pt intubated this am.  Pt remains on vent support post trach placement yesterday; noted GI and Surgery consulted for feeding tube placement  Diet Order:  Diet NPO time specified   EN: TF remains on hold due to no access post trach placement  Electrolyte and Renal Profile:  Recent Labs Lab 10/03/15 0434 10/17/2015 0607 10/05/15 0532  BUN 82* 99* 95*  CREATININE 1.56* 2.03* 2.83*  NA 144 143 140  K 4.1 4.4 4.8   Glucose Profile:  Recent Labs  09/26/2015 1611 10/05/15 0122 10/05/15 0723  GLUCAP 122* 120* 121*   Meds: NS at 50 ml/hr, oral meds on hold due to no OG/NG  Skin:  Reviewed, no issues  Last BM:  1/12  Height:   Ht Readings from Last 1 Encounters:  09/20/15  (1.753 m)    Weight:   Wt Readings from Last 1 Encounters:  10/05/15 634 lb 8 oz (287.807 kg)    Ideal Body Weight:     BMI:  Body mass index is 93.66 kg/(m^2).  Estimated Nutritional Needs:   Kcal:  1600-1817kcals (22-25kcals/kg) using  IBW of  72.7kg  Protein:  145-181g protein (2.0-2.5g/kg) using IBW of 72.7kg  Fluid:  2181-2561mL of fluid (30-74mL/kg)  EDUCATION NEEDS:   No education needs identified at this time  HIGH Care Level  Romelle Starcher MS, RD, LDN [phone] Pager  [phone] Weekend/On-Call Pager

## 2015-10-05 NOTE — Progress Notes (Signed)
Renal function, nutrition, and constipation discussed with Dr. Nemiah Commander and also discussed in rounds.  Plan established.

## 2015-10-05 NOTE — Consult Note (Signed)
GI Inpatient Consult Note  Reason for Consult: PEG tube   Attending Requesting Consult: Kallisetti  History of Present Illness: Mitchell Morrow is a 40 y.o. male with a history of morbid obesity, CHF, and Afib a/w acute on chronic hypercarbic respiratory failure.  Patient initially presented to the [facility] ED on 08/27/2015 with worsening upper and lower extremity swelling x 2 weeks.  He also noted a cough productive of sputum, SOB, and wheezing.  O2 sats were 74% on room air, improving to 92% with 4L O2 due to morbid obesity hypventiliation.  CXR demonstrated bilateral CAP, cardiomegaly, and vascular congestion.  Echo showed EF 45%, BNP elevated, so IV Lasix was initiated.  CAP resolved with IV Levaquin (approx 1/5). Patient was also started on IV ceftazidine and vanc for sepsis on 1/5.  On 09/20/15, patient entered hypercapnic respiratory failure requiring intubation.  Vent weaning was attempted on 1/7 but ultimately unsuccessful.  Patient later extubated himself, but was promptly re intubated w/o subsequent attempt to wean.   OG tube feedings were initiated after initial vent placement on 1/2 and until trach was placed.  GI consultation was ordered to evaluate feasibility of endoscopic PEG tube placement.  Past Medical History:  Past Medical History  Diagnosis Date  . Super obesity (HCC)   . Tobacco abuse   . Chronic systolic CHF (congestive heart failure) (HCC)   . Atrial fibrillation (HCC) October 19, 2015    Problem List: Patient Active Problem List   Diagnosis Date Noted  . Orthostatic hypotension   . Atrial fibrillation with RVR (HCC)   . Bilateral pneumonia   . Respiratory failure (HCC)   . Morbid obesity due to excess calories (HCC)   . Acute respiratory failure (HCC) 09/20/2015  . CHF (congestive heart failure) (HCC) 09/20/2015  . Acute encephalopathy 09/20/2015  . Cocaine abuse 09/20/2015  . Pneumonia 09/03/2015    Past Surgical History: Past Surgical History  Procedure Laterality Date   . Tracheostomy tube placement N/A 10/13/2015    Procedure: TRACHEOSTOMY;  Surgeon: Vernie Murders, MD;  Location: [facility] ORS;  Service: ENT;  Laterality: N/A;    Allergies: No Known Allergies   Home Medications: No prescriptions prior to admission   Home medication reconciliation was completed with the patient.   Scheduled Inpatient Medications:   . albumin human  25 g Intravenous Q12H  . antiseptic oral rinse  7 mL Mouth Rinse 10 times per day  . budesonide  0.25 mg Nebulization 4 times per day  . ceFEPime (MAXIPIME) IV  2 g Intravenous 3 times per day  . chlorhexidine gluconate  15 mL Mouth Rinse BID  . enoxaparin (LOVENOX) injection  40 mg Subcutaneous Q12H  . famotidine  20 mg Per Tube BID  . Influenza vac split quadrivalent PF  0.5 mL Intramuscular Tomorrow-1000  . ipratropium-albuterol  3 mL Nebulization Q6H  . metoprolol  5 mg Intravenous 4 times per day  . nystatin   Topical BID  . senna-docusate  1 tablet Oral QHS    Continuous Inpatient Infusions:   . sodium chloride 50 mL/hr at 10/05/15 0119  . amiodarone 30 mg/hr (10/05/15 0916)  . fentaNYL infusion INTRAVENOUS 150 mcg/hr (10/05/15 0958)  . furosemide (LASIX) infusion      PRN Inpatient Medications:  acetaminophen **OR** acetaminophen, albuterol, bisacodyl, calcium carbonate, fentaNYL (SUBLIMAZE) injection, ibuprofen, LORazepam, ondansetron **OR** ondansetron (ZOFRAN) IV, sodium chloride  Family History: family history includes Diabetes in his other.   Social History:   reports that he has been smoking.  He does not have any smokeless tobacco history on file. He reports that he does not drink alcohol.   Review of Systems: Unable to assess - patient intubated   Physical Examination: BP 94/63 mmHg  Pulse 96  Temp(Src) 98.2 F (36.8 C) (Axillary)  Resp 25  Ht  (1.753 m)  Wt 287.807 kg (634 lb 8 oz)  BMI 93.66 kg/m2  SpO2 95% Gen: Critically ill, morbidly obese male lying in bed on ventilator,  occasionally tracks with eyes HEENT: PEERLA, EOMI Neck: supple, no JVD or thyromegaly, trach in palce Chest: Coarse breath sounds noted bilaterally, air entry noted CV: RRR, no m/g/c/r Abd: soft, NT, morbidly obese, +BS in all four quadrants; no HSM, guarding, ridigity, or rebound tenderness Ext: well perfused with 2+ pulses, 2+ upper and lower extremity edema Skin: no rash or lesions noted Lymph: no LAD  Data: Lab Results  Component Value Date   WBC 10.7* 10/05/2015   HGB 11.6* 10/05/2015   HCT 38.7* 10/05/2015   MCV 86.4 10/05/2015   PLT 137* 10/05/2015    Recent Labs Lab 10/02/15 0305 2015/10/11 0607 10/05/15 0532  HGB 11.3* 11.4* 11.6*   Lab Results  Component Value Date   NA 140 10/05/2015   K 4.8 10/05/2015   CL 102 10/05/2015   CO2 28 10/05/2015   BUN 95* 10/05/2015   CREATININE 2.83* 10/05/2015   Lab Results  Component Value Date   ALT 10* 09/24/2015   AST 19 09/24/2015   ALKPHOS 55 09/24/2015   BILITOT 4.1* 09/24/2015   No results for input(s): APTT, INR, PTT in the last 168 hours.   Assessment/Plan: Mr. Manigo is a 40 y.o. male with a history of morbid obesity, CHF, and Afib a/w acute on chronic hypercarbic respiratory failure.  Due to patient's body habitus, he is not a good candidate for endoscopic PEG tube placement; the equipment used during the procedure, including the introducer needle, will not be long enough to pass through the patient's abdomen.  Considering PEG tube placement per interventional radiology or general surgery is recommended.      Recommendations: - Patient is poor candidate for endoscopic PEG tube placement - recommend placement per interventional radiology or general surgery  Thank you for the consult. We will follow along with you. Please call with questions or concerns.  Burman Freestone, PA-C [facility] Phone: [phone] Pager: [phone]

## 2015-10-05 NOTE — Progress Notes (Signed)
   Discussed with Dr. Mariah Milling, MD. Patient's OG tube has been discontinued requiring him to be placed back on amiodarone gtt on the evening of 1/16. He remains in sinus rhythm. Continue amiodarone gtt at this time at 1/2 rate.   Eula Listen, PA-C 10/05/2015 9:14 AM

## 2015-10-05 NOTE — Anesthesia Postprocedure Evaluation (Signed)
Anesthesia Post Note  Patient: Mitchell Morrow  Procedure(s) Performed: Procedure(s) (LRB): TRACHEOSTOMY (N/A)  Patient location during evaluation: ICU Anesthesia Type: General Level of consciousness: sedated Pain management: pain level controlled Vital Signs Assessment: post-procedure vital signs reviewed and stable Respiratory status: patient on ventilator - see flowsheet for VS Cardiovascular status: stable Anesthetic complications: no    Last Vitals:  Filed Vitals:   10/05/15 0445 10/05/15 0600  BP:  97/67  Pulse:  94  Temp: 37.2 C   Resp:  25    Last Pain:  Filed Vitals:   10/05/15 0609  PainSc: 0-No pain                 Rosealynn Mateus,  Sheran Fava

## 2015-10-05 NOTE — Progress Notes (Signed)
Spoke with Dr. Rosalene Billings via telephone to inquire about NG tube placement.  Per Dr. Rosalene Billings NG tube can be placed with no contraindications. Dr. Nemiah Commander to be paged to ask about placement.

## 2015-10-05 NOTE — Progress Notes (Signed)
Dr. Gwenette Greet informed that intensivist ordered lasix  BID. Telephone order obtained to d/c order due to previous notes stating to hold lasix.

## 2015-10-05 NOTE — Progress Notes (Signed)
Called by Rn for pt desats. Pt SPO2 87-89%. Increased FIO2 .70, SPO2 92%.

## 2015-10-05 NOTE — Progress Notes (Signed)
Dr. Nemiah Commander to place order for NG tube.

## 2015-10-05 NOTE — Progress Notes (Signed)
Pharmacy Antibiotic Follow-up Note  Mitchell Morrow is a 40 y.o. year-old male admitted on 09/16/2015.  The patient is currently on day 4 of Vancomycin and cefepime for HCAP.    Assessment/Plan: Continue vancomycin 1500 mg iv q 12 hours with vancomycin to stop 01/17 after 5 days as previously planned. Will continue cefepime 2 g iv q 8 hours Will f/u duration of therapy with MD.   Temp (24hrs), Avg:98.8 F (37.1 C), Min:98.2 F (36.8 C), Max:99 F (37.2 C)   Recent Labs Lab 09/30/15 0447 10/01/15 0426 10/02/15 0305 12-Oct-2015 0607 10/05/15 0532  WBC 12.2* 11.5* 12.3* 10.3 10.7*     Recent Labs Lab 10/01/15 1426 10/02/15 0305 10/03/15 0434 10-12-15 0607 10/05/15 0532  CREATININE 1.57* 1.50* 1.56* 2.03* 2.83*   Estimated Creatinine Clearance: 76.5 mL/min (by C-G formula based on Cr of 2.83).    No Known Allergies  Antimicrobials this admission: Levofloxacin 12/30 >> 01/01 Ceftriaxone 01/02 >> 01/02 Azithro 01/02 >> 01/02 Levofloxacin 01/04 >> 01/05 Vanc 01/05 >> 1/9 Ceftaz 01/05 >>1/9  Vanc 1/13>> 1/17 Cefepime 1/13>>  Levels/dose changes this admission: Trough on 1/14 of 38 mcg/ml Random vancomycin level on 01/15 of 21 mcg/ml  Microbiology results: 1/13 BCx: 1/2 CNS 1/13 UCx: negative 1/13 Sputum: respiratory flora 1/2 MRSA PCR: negative  Thank you for allowing pharmacy to be a part of this patient's care.  Luisa Hart D PharmD 10/05/2015 12:56 PM

## 2015-10-05 NOTE — Progress Notes (Signed)
PULMONARY/CCM PROGRESS NOTE  Active problems: Acute ventilator dependent hypercapneic respiratory failure-failed extubation x 1 with morbid obesity; CXR 1/17 reviewed; showed increasing pulmonary edema, will restart lasix today.  Vasopressor dependent hypotension; wean down as tolerated.  AKI - increased creatinine today.     Lines, Tubes, etc: ETT 01/02 >> . Patient self extubated, subsequently reintubated 09/25/2015 L IJ CVL 01/02 >>   Microbiology: Blood 12/30 >>  MRSA PCR 01/02 >> NEG Urine 01/04 >> NEG Blood 01/04 >> no growth   PCT 01/04: 0.74  Antibiotics:  Levofloxacin 12/30 >> 01/01 Ceftriaxone 01/02 >> 01/02 Azithro 01/02 >> 01/02 Levofloxacin 01/04 >> 01/05 Vanc 01/05 >> 1/9 Ceftaz 01/05 >>1/9   PCT 01/04: 0.74, 0.95, 0.77  Studies/Events: 12/31 TTE:  poor quality study, LVEF 45% 12/31 RUQ: Nodular contour the liver raising the question of cirrhosis. Gallbladder wall thickening, nonspecific in appearance 01/04 night - fever, hypotension. Cultures obtained. Abx expanded -failed self extubation  Consultants:  Renal 01/05: no acute indication for HD as this time   Best Practice: DVT: enoxaparin SUP: enteral famotidine Nutrition: TFs Glycemic control: N/I Sedation/analgesia: fent/dexmedetomidine infusion. PRN lorazepam   Subj: Patient remains intubated,sedated, will track with eyes Will need trach for survival -ENT consulted -afib on amiodarone gtt   Obj: Filed Vitals:   10/05/15 0800 10/05/15 1000 10/05/15 1100 10/05/15 1117  BP:   Pulse: 96 98 99 100  Temp:    98.2 F (36.8 C)  TempSrc:    Axillary  Resp: Height:      Weight:      SpO2: 98% 97% 97% 96%    Gen: morbidly obese HEENT:  WNL Neck: CVL site clean, JVP cannot be assessed Chest: clear today somewhat prolonged exp time Cardiac: distant HS, regular, no M noted Abd: obese, soft, + BS Ext: symmetric edema, chronic stasis changes Neuro:  GCS<10T  BMET    Component Value Date/Time   NA 140 10/05/2015 0532   K 4.8 10/05/2015 0532   CL 102 10/05/2015 0532   CO2 28 10/05/2015 0532   GLUCOSE 121* 10/05/2015 0532   BUN 95* 10/05/2015 0532   CREATININE 2.83* 10/05/2015 0532   CALCIUM 8.6* 10/05/2015 0532   GFRNONAA 26* 10/05/2015 0532   GFRAA 30* 10/05/2015 0532    CBC    Component Value Date/Time   WBC 10.7* 10/05/2015 0532   RBC 4.48 10/05/2015 0532   HGB 11.6* 10/05/2015 0532   HCT 38.7* 10/05/2015 0532   PLT 137* 10/05/2015 0532   MCV 86.4 10/05/2015 0532   MCH 25.8* 10/05/2015 0532   MCHC 29.8* 10/05/2015 0532   RDW 21.8* 10/05/2015 0532   LYMPHSABS 2.1 09/24/2015 0512   MONOABS 1.5* 09/24/2015 0512   EOSABS 0.3 09/24/2015 0512   BASOSABS 0.0 09/24/2015 0512    CXR:  New linear atelectasis in the right lung apex. Persistent bibasilar atelectasis and effusions   IMPRESSION: 1) Acute on chronic hypercarbic respiratory failure - multifactorial 2) Obesity hypoventilation syndrome 3) Atelectasis 4) Pulm edema vs ALI/ARDS 5) Fever, hypotension, suspect severe sepsis 01/05 - likely due to PNA-resolved 6) Hypotension resolved 7) AKI, non-oliguric - Cr improving 01/06 8) Mild hyperglycemia without prior dx of DM 9) Elevated bilirubin - likely cholestasis. Rest of LFTs normal. RUQ [place] unrevealing 10) Thrombocytopenia, unknown chronicity - stable 11) ICU associated discomfort 12)low BP -?volume depleted 13)acute afib with RVR Acute systolic CHF with volume overload.   PLAN/RECS:  Cont vent support - settings  reviewed and/or adjusted  Diurese today.  Consult for feeding tube placement.  Cont vent bundle Follow CXR intermittently Daily SBT if/when meets criteria MAP goal > 65 mmHg Monitor BMET intermittently Monitor I/Os Correct electrolytes as indicated Cont TFs Bowel regimen initiated 01/06--appears at goal, will continue.  Monitor glucose. Consider SSI for glu > 180     -Deep Nicholos Johns,  M.D.   Critical Care Attestation.  I have personally obtained a history, examined the patient, evaluated laboratory and imaging results, formulated the assessment and plan and placed orders. The Patient requires high complexity decision making for assessment and support, frequent evaluation and titration of therapies, application of advanced monitoring technologies and extensive interpretation of multiple databases. The patient has critical illness that could lead imminently to failure of 1 or more organ systems and requires the highest level of physician preparedness to intervene.  Critical Care Time devoted to patient care services described in this note is 35 minutes and is exclusive of time spent in procedures.

## 2015-10-05 NOTE — Addendum Note (Signed)
Addendum  created 10/05/15 0729 by Junious Silk, CRNA   Modules edited: Notes Section   Notes Section:  File: [id number]

## 2015-10-05 NOTE — Progress Notes (Signed)
[facility] [place] [facility]   PATIENT NAME: Mitchell Morrow    MR#:  [id number]  DATE OF BIRTH:  [birth year]  SUBJECTIVE:   - Tracheostomy done on 11/03/2015. Patient is alert, following only some simple commands. Less responsive than yesterday. - OG-tube has been discontinued for trach placement yesterday. No access for feeding at this time. Will need a PEG tube.  REVIEW OF SYSTEMS:   Review of Systems  Unable to perform ROS: critical illness    DRUG ALLERGIES:  No Known Allergies  VITALS:  Blood pressure 97/65, pulse 96, temperature 99 F (37.2 C), temperature source Oral, resp. rate 14, height  (1.753 m), weight 287.807 kg (634 lb 8 oz), SpO2 98 %.  PHYSICAL EXAMINATION:   Physical Exam   GENERAL: 40 y.o.-year-old morbidly obese, patient lying in the bed with no respiratory distress. EYES: Pupils equal, round, reactive to light and accommodation. No scleral icterus. Extraocular muscles intact.  HEENT: Head atraumatic, normocephalic. Oropharynx and nasopharynx clear. No oropharyngeal erythema, moist oral mucosa  NECK: Supple, short and thick. Tracheostomy in place. no jugular venous distention. No thyroid enlargement, no tenderness.  LUNGS: Poor air entry on both sides with scattered bilateral wheezing. Decreased bibasilar breath sounds. CARDIOVASCULAR: S1, S2 normal. No murmurs, rubs, or gallops.  ABDOMEN: Soft, nontender, nondistended. Bowel sounds present. No organomegaly or mass.  EXTREMITIES: No cyanosis, or clubbing. bilateral lower extremity lymphedema NEUROLOGIC: alert and following some simple commands and going back to sleep. PSYCHIATRIC: The patient is alert but not oriented. Tracheostomy in place. Nonverbal..  SKIN: Dry skin. Erythema in inframammary and groin folds.   LABORATORY PANEL:   CBC  Recent Labs Lab 10/05/15 0532  WBC 10.7*  HGB 11.6*  HCT 38.7*  PLT 137*    ------------------------------------------------------------------------------------------------------------------  Chemistries   Recent Labs Lab 10/05/15 0532  NA 140  K 4.8  CL 102  CO2 28  GLUCOSE 121*  BUN 95*  CREATININE 2.83*  CALCIUM 8.6*   ------------------------------------------------------------------------------------------------------------------  Cardiac Enzymes No results for input(s): TROPONINI in the last 168 hours. ------------------------------------------------------------------------------------------------------------------  RADIOLOGY:  Dg Chest 1 View  10/05/2015  CLINICAL DATA:  Dyspnea. EXAM: CHEST 1 VIEW COMPARISON:  11-03-15. FINDINGS: Tracheostomy is in grossly good position. Hypoinflation of the lungs is noted. Left internal jugular catheter is noted with distal tip in expected position of SVC. Stable cardiomegaly. No pneumothorax or significant pleural effusion is noted. Slightly increased right upper lobe opacity is noted concerning for atelectasis or possibly pneumonia. Stable left upper lobe opacity is noted. Bony thorax is unremarkable. IMPRESSION: Right upper lobe opacity is noted which is slightly increased suggesting worsening pneumonia or atelectasis. Stable left upper lobe opacity is noted. Hypoinflation of the lungs is noted. Electronically Signed   By: Lupita Raider, M.D.   On: 10/05/2015 07:50   Dg Chest Port 1 View  03-Nov-2015  CLINICAL DATA:  Pt had trach placed this am; pt has been in hospital since December for multiple problems; morbidly obese, smoker, afib EXAM: PORTABLE CHEST 1 VIEW COMPARISON:  10/03/2015 FINDINGS: Tracheostomy tube projects over the tracheal air column with tip 4.5 cm above the carina. Left internal jugular central line tip projects over the superior vena cava. Study limited by body habitus. Severe cardiac enlargement. Vascular congestion, moderate. Right lung otherwise clear. Patchy left upper lobe opacity  consistent with infiltrate. IMPRESSION: Lines and tubes as described with severe cardiac enlargement. Vascular congestion persists. Focal left upper lobe airspace disease  likely represents residual clearing pulmonary edema. Pneumonia not excluded and follow-up recommended. Electronically Signed   By: Esperanza Heir M.D.   On: 10/07/2015 18:43    ASSESSMENT AND PLAN:   40 year old male with a history of morbid obesity and tobacco abuse who presented with lower extremity edema and found to have bilateral pneumonia.  1. Acute hypoxic hypercapnic respiratory failure-: This is multifactorial  due to obesity hypoventilation syndrome, community-acquired pneumonia and pulmonary edema versus ARDS.  -Intubated and on ventilator. Tracheostomy done on 10/02/2015. -Appreciate pulmonary consult. -Continues to be hypoxic, FiO2 at 70%. -Continue antibiotics for his pneumonia. -On neb treatments. If no improvement, consider steroids.  2. Bilateral community-acquired pneumonia with septic shock -was on vancomycin and ceftazidime and he had completed course of treatment.  -But due to recurrent fevers, started on cefepime and vancomycin. Repeat Blood cultures on 1/13 show contaminant.  - off pressors.   3.New onset systolic congestive heart failure,  EF 45%  - had elevated BNP, edema and vascular congestion on chest x-ray  Stopped IV Lasix due to worsening renal function  Pulmonary started low dose IV fluids due to low blood pressure and decreased UOP.  -Follow up repeat chest x-ray.  4. Acute renal failure - Due to ATN and diuresis -Due to decreased urine output and worsening renal function-reconsult nephrology  5. Nutrition-OG-tube taken out for tracheostomy yesterday. No axis for nutrition now. Will need a PEG tube. -GI consulted. But if due to his large body habitus, if GI cannot do it endoscopically,  The then we will have to consider percutaneous PEG placement by surgical team. -Oral meds and tube  feeds are on hold until then.  6. Morbid obesity will need bariatric referral as outpatient  7. Thrombocytopenia - this is due to acute illness. Platelet count has improved. negative hepatitis panel, negative HIV.  8.Hyperbilirubinemia could be passive congestion from congestive heart failure. hepatitis panel and ultrasound of right upper quadrant suggestive of cirrhotic changes. US showed Cirrhosis  9. Atrial fibrillation, -known history of paroxysmal atrial fibrillation. Now in A. fib with RVR and on amiodarone drip. -Once able to get a PEG tube, amiodarone can be changed to oral. -Appreciate cardiology consult. -Risk of long-term full dose anticoagulation outweighed benefit at this time as per cardiac.   CODE STATUS: full.  TOTAL CRITICAL CARE TIME SPENT IN TAKING CARE OF THIS PATIENT: 40 critical care minutes.    He remains critically ill and high risk for cardiopulmonary arrest. Patient will also likely require peg tube.   Enid Baas M.D on 10/05/2015   Between 7am to 6pm - Pager - [phone]  After 6pm go to [email or web address] - password [email or web address] [facility]  [facility]  Office  [phone]  CC: Primary care physician; No PCP Per Patient  Note: This dictation was prepared with Dragon dictation along with smaller phrase technology. Any transcriptional errors that result from this process are unintentional.

## 2015-10-05 NOTE — Progress Notes (Signed)
PHARMACY - CRITICAL CARE PROGRESS NOTE  Pharmacy Consult for Constipation Prevention.  Indication: ICU Status   No Known Allergies  Patient Measurements: Height:  (175.3 cm) Weight: (!) 634 lb 8 oz (287.807 kg) IBW/kg (Calculated) : 70.7   Vital Signs: Temp: 98.2 F (36.8 C) (01/17 1117) Temp Source: Axillary (01/17 1117) BP: 89/70 mmHg (01/17 1200) Pulse Rate: 102 (01/17 1200) Intake/Output from previous day: 01/16 0701 - 01/17 0700 In: 2871.9 [I.V.:2221.9; IV Piggyback:650] Out: 295 [Urine:275; Blood:20] Intake/Output from this shift: Total I/O In: 1010 [I.V.:510; IV Piggyback:500] Out: 25 [Urine:25] Vent settings for last 24 hours: Vent Mode:  [-] PRVC FiO2 (%):  [45 %-70 %] 70 % Set Rate:  [16 bmp-25 bmp] 25 bmp Vt Set:  [500 mL] 500 mL PEEP:  [8 cmH20] 8 cmH20  Labs:  Recent Labs  10/03/15 0434 09/26/2015 0607 10/05/15 0532  WBC  --  10.3 10.7*  HGB  --  11.4* 11.6*  HCT  --  38.5* 38.7*  PLT  --  137* 137*  CREATININE 1.56* 2.03* 2.83*   Estimated Creatinine Clearance: 76.5 mL/min (by C-G formula based on Cr of 2.83).   Recent Labs  October 18, 2015 1611 10/05/15 0122 10/05/15 0723  GLUCAP 122* 120* 121*    Microbiology:   Medications:  Scheduled:  . antiseptic oral rinse  7 mL Mouth Rinse 10 times per day  . budesonide  0.25 mg Nebulization 4 times per day  . ceFEPime (MAXIPIME) IV  2 g Intravenous 3 times per day  . chlorhexidine gluconate  15 mL Mouth Rinse BID  . enoxaparin (LOVENOX) injection  40 mg Subcutaneous Q12H  . famotidine  20 mg Per Tube BID  . Influenza vac split quadrivalent PF  0.5 mL Intramuscular Tomorrow-1000  . ipratropium-albuterol  3 mL Nebulization Q6H  . metoprolol  5 mg Intravenous 4 times per day  . nystatin   Topical BID  . senna-docusate  1 tablet Oral QHS   Infusions:  . sodium chloride 50 mL/hr at 10/05/15 0119  . amiodarone 30 mg/hr (10/05/15 0916)  . fentaNYL infusion INTRAVENOUS 150 mcg/hr (10/05/15 5784)     Assessment: Pharmacy consulted for constipation management for 40 yo male ICU patient requiring mechanical ventilation.  Patient is currently on senna/docusate 1 tab v/t qhs.    Plan:  Patient had multiple BM 1/12 but none since. Patient back from OR yesterday without feeding tube. Plan on rounds is to first determine if patient is a candidate for PEG placement and go from there. Will f/u AM.     Luisa Hart D 10/05/2015,12:54 PM

## 2015-10-05 NOTE — Progress Notes (Signed)
Pharmacy Antibiotic Follow-up Note  Mitchell Morrow is a 40 y.o. year-old male admitted on 09/07/2015.  The patient is currently on day 4 of Vancomycin and cefepime for HCAP.    Assessment/Plan: Continue vancomycin 1500 mg iv q 12 hours with vancomycin to stop 01/17 after 5 days as previously planned. After discussion with Dr. Nicholos Johns, will d/c cefepime today.    Temp (24hrs), Avg:98.7 F (37.1 C), Min:98.2 F (36.8 C), Max:99 F (37.2 C)   Recent Labs Lab 09/30/15 0447 10/01/15 0426 10/02/15 0305 10-20-15 0607 10/05/15 0532  WBC 12.2* 11.5* 12.3* 10.3 10.7*     Recent Labs Lab 10/01/15 1426 10/02/15 0305 10/03/15 0434 2015-10-20 0607 10/05/15 0532  CREATININE 1.57* 1.50* 1.56* 2.03* 2.83*   Estimated Creatinine Clearance: 76.5 mL/min (by C-G formula based on Cr of 2.83).    No Known Allergies  Antimicrobials this admission: Levofloxacin 12/30 >> 01/01 Ceftriaxone 01/02 >> 01/02 Azithro 01/02 >> 01/02 Levofloxacin 01/04 >> 01/05 Vanc 01/05 >> 1/9 Ceftaz 01/05 >>1/9  Vanc 1/13>> 1/17 Cefepime 1/13>>  Levels/dose changes this admission: Trough on 1/14 of 38 mcg/ml Random vancomycin level on 01/15 of 21 mcg/ml  Microbiology results: 1/13 BCx: 1/2 CNS 1/13 UCx: negative 1/13 Sputum: respiratory flora 1/2 MRSA PCR: negative  Thank you for allowing pharmacy to be a part of this patient's care.  Luisa Hart D PharmD 10/05/2015 2:55 PM

## 2015-10-05 NOTE — Progress Notes (Signed)
Nutrition Follow-up    INTERVENTION:   Coordination of Care: NG successfully placed and orders received for initiation of EN. Recommend restarting Vital High Protein at 20 ml/hr, goal of 60 ml/hr with titration orders in order set (1440 kcals, 127 g of protein). Recommend addition of Prostat TID (additional 45 g of protein, 300 kcals) with minimal free water flushes at this time due to current volume status. Recommend aggressive bowel regimen as pt last BM documented on 1/12   NUTRITION DIAGNOSIS:   Inadequate oral intake related to inability to eat as evidenced by NPO status. Being addressed as NG placed post trach and TF started  GOAL:   Patient will meet greater than or equal to 90% of their needs  MONITOR:    (Energy Intake, Digestive System, Pulmonary Profile, Electrolyte and Renal Profile, Anthropometrics)  REASON FOR ASSESSMENT:   Ventilator, Consult Enteral/tube feeding initiation and management  ASSESSMENT:   Nephrology re-consulted due to worsening renal function  Diet Order:  Diet NPO time specified   Digestive System: NG placed successfully per Fleet Contras RN  Skin:  Reviewed, no issues  Last BM:  1/12   No new labs since earlier assessment today   Height:   Ht Readings from Last 1 Encounters:  09/20/15  (1.753 m)    Weight:   Wt Readings from Last 1 Encounters:  10/05/15 634 lb 8 oz (287.807 kg)   BMI:  Body mass index is 93.66 kg/(m^2).  Estimated Nutritional Needs:   Kcal:  1600-1817kcals (22-25kcals/kg) using  IBW of 72.7kg  Protein:  145-181g protein (2.0-2.5g/kg) using IBW of 72.7kg  Fluid:  2181-2543mL of fluid (30-63mL/kg)  EDUCATION NEEDS:   No education needs identified at this time   HIGH Care Level  Romelle Starcher MS, RD, LDN [phone] Pager  [phone] Weekend/On-Call Pager

## 2015-10-06 ENCOUNTER — Inpatient Hospital Stay: Payer: Medicaid Other

## 2015-10-06 LAB — COMPREHENSIVE METABOLIC PANEL
ALK PHOS: 74 U/L (ref 38–126)
ALT: 13 U/L — AB (ref 17–63)
AST: 41 U/L (ref 15–41)
Albumin: 2.9 g/dL — ABNORMAL LOW (ref 3.5–5.0)
Anion gap: 8 (ref 5–15)
BILIRUBIN TOTAL: 3.6 mg/dL — AB (ref 0.3–1.2)
BUN: 113 mg/dL — AB (ref 6–20)
CALCIUM: 8.9 mg/dL (ref 8.9–10.3)
CHLORIDE: 105 mmol/L (ref 101–111)
CO2: 28 mmol/L (ref 22–32)
CREATININE: 3.27 mg/dL — AB (ref 0.61–1.24)
GFR, EST AFRICAN AMERICAN: 25 mL/min — AB (ref >60)
GFR, EST NON AFRICAN AMERICAN: 22 mL/min — AB (ref >60)
Glucose, Bld: 131 mg/dL — ABNORMAL HIGH (ref 65–99)
Potassium: 4.8 mmol/L (ref 3.5–5.1)
Sodium: 141 mmol/L (ref 135–145)
Total Protein: 7 g/dL (ref 6.5–8.1)

## 2015-10-06 LAB — CULTURE, BLOOD (ROUTINE X 2): CULTURE: NO GROWTH

## 2015-10-06 LAB — CBC
HEMATOCRIT: 37 % — AB (ref 40.0–52.0)
HEMOGLOBIN: 10.9 g/dL — AB (ref 13.0–18.0)
MCH: 25 pg — AB (ref 26.0–34.0)
MCHC: 29.4 g/dL — ABNORMAL LOW (ref 32.0–36.0)
MCV: 85 fL (ref 80.0–100.0)
Platelets: 130 10*3/uL — ABNORMAL LOW (ref 150–440)
RBC: 4.36 MIL/uL — AB (ref 4.40–5.90)
RDW: 21.4 % — ABNORMAL HIGH (ref 11.5–14.5)
WBC: 9.4 10*3/uL (ref 3.8–10.6)

## 2015-10-06 LAB — RENAL FUNCTION PANEL
ANION GAP: 10 (ref 5–15)
Albumin: 3 g/dL — ABNORMAL LOW (ref 3.5–5.0)
BUN: 118 mg/dL — ABNORMAL HIGH (ref 6–20)
CHLORIDE: 102 mmol/L (ref 101–111)
CO2: 28 mmol/L (ref 22–32)
Calcium: 9 mg/dL (ref 8.9–10.3)
Creatinine, Ser: 3.77 mg/dL — ABNORMAL HIGH (ref 0.61–1.24)
GFR calc non Af Amer: 18 mL/min — ABNORMAL LOW (ref >60)
GFR, EST AFRICAN AMERICAN: 21 mL/min — AB (ref >60)
Glucose, Bld: 119 mg/dL — ABNORMAL HIGH (ref 65–99)
Phosphorus: 6.9 mg/dL — ABNORMAL HIGH (ref 2.5–4.6)
Potassium: 4.8 mmol/L (ref 3.5–5.1)
Sodium: 140 mmol/L (ref 135–145)

## 2015-10-06 LAB — CULTURE, RESPIRATORY W GRAM STAIN

## 2015-10-06 LAB — GLUCOSE, CAPILLARY
GLUCOSE-CAPILLARY: 114 mg/dL — AB (ref 65–99)
GLUCOSE-CAPILLARY: 121 mg/dL — AB (ref 65–99)
GLUCOSE-CAPILLARY: 123 mg/dL — AB (ref 65–99)
GLUCOSE-CAPILLARY: 135 mg/dL — AB (ref 65–99)
Glucose-Capillary: 109 mg/dL — ABNORMAL HIGH (ref 65–99)
Glucose-Capillary: 129 mg/dL — ABNORMAL HIGH (ref 65–99)

## 2015-10-06 LAB — MAGNESIUM: Magnesium: 2.9 mg/dL — ABNORMAL HIGH (ref 1.7–2.4)

## 2015-10-06 LAB — CULTURE, RESPIRATORY: SPECIAL REQUESTS: NORMAL

## 2015-10-06 MED ORDER — AMIODARONE HCL 200 MG PO TABS
400.0000 mg | ORAL_TABLET | Freq: Two times a day (BID) | ORAL | Status: DC
  Administered 2015-10-06 – 2015-10-09 (×7): 400 mg via ORAL
  Filled 2015-10-06 (×7): qty 2

## 2015-10-06 MED ORDER — HEPARIN SODIUM (PORCINE) 5000 UNIT/ML IJ SOLN
5000.0000 [IU] | Freq: Three times a day (TID) | INTRAMUSCULAR | Status: DC
  Administered 2015-10-06 – 2015-10-07 (×5): 5000 [IU] via SUBCUTANEOUS
  Filled 2015-10-06 (×5): qty 1

## 2015-10-06 MED ORDER — PUREFLOW DIALYSIS SOLUTION
INTRAVENOUS | Status: DC
  Administered 2015-10-06: 2500 mL via INTRAVENOUS_CENTRAL
  Administered 2015-10-07: 3 via INTRAVENOUS_CENTRAL
  Administered 2015-10-07: 2500 mL via INTRAVENOUS_CENTRAL
  Administered 2015-10-08 (×2): 3 via INTRAVENOUS_CENTRAL
  Administered 2015-10-09: 2500 mL via INTRAVENOUS_CENTRAL
  Administered 2015-10-09: 3 via INTRAVENOUS_CENTRAL
  Administered 2015-10-09: 13:00:00 via INTRAVENOUS_CENTRAL

## 2015-10-06 MED ORDER — POLYETHYLENE GLYCOL 3350 17 G PO PACK
17.0000 g | PACK | Freq: Every day | ORAL | Status: DC
  Administered 2015-10-06: 17 g via ORAL
  Filled 2015-10-06 (×2): qty 1

## 2015-10-06 MED ORDER — ESCITALOPRAM OXALATE 10 MG PO TABS
5.0000 mg | ORAL_TABLET | Freq: Every day | ORAL | Status: DC
  Administered 2015-10-06 – 2015-10-09 (×4): 5 mg via ORAL
  Filled 2015-10-06 (×4): qty 1

## 2015-10-06 MED ORDER — DOCUSATE SODIUM 50 MG/5ML PO LIQD
100.0000 mg | Freq: Two times a day (BID) | ORAL | Status: DC
  Administered 2015-10-06 – 2015-10-11 (×12): 100 mg via ORAL
  Filled 2015-10-06 (×12): qty 10

## 2015-10-06 MED ORDER — HEPARIN SODIUM (PORCINE) 1000 UNIT/ML DIALYSIS
1000.0000 [IU] | INTRAMUSCULAR | Status: DC | PRN
  Administered 2015-10-07 – 2015-10-11 (×4): 1000 [IU] via INTRAVENOUS_CENTRAL
  Filled 2015-10-06 (×7): qty 6

## 2015-10-06 NOTE — Progress Notes (Signed)
[facility] [place] [facility]   PATIENT NAME: Mitchell Morrow    MR#:  [id number]  DATE OF BIRTH:  [birth year]  SUBJECTIVE:   - Tracheostomy done on 2015-10-17. Patient is alert, following some simple commands. Nodding head occasionally. - NG tube in place and tube feeds started. Started on Lasix drip yesterday, only 90 cc urine output in 24 hours. Worsening BUN and creatinine  REVIEW OF SYSTEMS:   Review of Systems  Unable to perform ROS: critical illness    DRUG ALLERGIES:  No Known Allergies  VITALS:  Blood pressure 94/62, pulse 90, temperature 97 F (36.1 C), temperature source Axillary, resp. rate 11, height  (1.753 m), weight 288.851 kg (636 lb 12.8 oz), SpO2 94 %.  PHYSICAL EXAMINATION:   Physical Exam   GENERAL: 40 y.o.-year-old morbidly obese, patient lying in the bed with no respiratory distress. EYES: Pupils equal, round, reactive to light and accommodation. No scleral icterus. Extraocular muscles intact.  HEENT: Head atraumatic, normocephalic. Oropharynx and nasopharynx clear. No oropharyngeal erythema, moist oral mucosa  NECK: Supple, short and thick. Tracheostomy in place. no jugular venous distention. No thyroid enlargement, no tenderness.  LUNGS: Poor air entry on both sides with scattered bilateral wheezing. Decreased bibasilar breath sounds. CARDIOVASCULAR: S1, S2 normal. No murmurs, rubs, or gallops.  ABDOMEN: Soft, nontender, nondistended. Bowel sounds present. No organomegaly or mass.  EXTREMITIES: No cyanosis, or clubbing. bilateral lower extremity lymphedema NEUROLOGIC: alert and following some simple commands and going back to sleep. PSYCHIATRIC: The patient is alert but not oriented. Tracheostomy in place. Nonverbal..  SKIN: Dry skin. Erythema in inframammary and groin folds.   LABORATORY PANEL:   CBC  Recent Labs Lab 10/06/15 0500  WBC 9.4  HGB 10.9*  HCT 37.0*  PLT 130*    ------------------------------------------------------------------------------------------------------------------  Chemistries   Recent Labs Lab 10/06/15 0500  NA 141  K 4.8  CL 105  CO2 28  GLUCOSE 131*  BUN 113*  CREATININE 3.27*  CALCIUM 8.9  AST 41  ALT 13*  ALKPHOS 74  BILITOT 3.6*   ------------------------------------------------------------------------------------------------------------------  Cardiac Enzymes No results for input(s): TROPONINI in the last 168 hours. ------------------------------------------------------------------------------------------------------------------  RADIOLOGY:  Dg Chest 1 View  10/05/2015  CLINICAL DATA:  Dyspnea. EXAM: CHEST 1 VIEW COMPARISON:  2015/10/17. FINDINGS: Tracheostomy is in grossly good position. Hypoinflation of the lungs is noted. Left internal jugular catheter is noted with distal tip in expected position of SVC. Stable cardiomegaly. No pneumothorax or significant pleural effusion is noted. Slightly increased right upper lobe opacity is noted concerning for atelectasis or possibly pneumonia. Stable left upper lobe opacity is noted. Bony thorax is unremarkable. IMPRESSION: Right upper lobe opacity is noted which is slightly increased suggesting worsening pneumonia or atelectasis. Stable left upper lobe opacity is noted. Hypoinflation of the lungs is noted. Electronically Signed   By: Lupita Raider, M.D.   On: 10/05/2015 07:50   Dg Abd 1 View  10/05/2015  CLINICAL DATA:  NG tube placement. EXAM: ABDOMEN - 1 VIEW COMPARISON:  09/25/2015 FINDINGS: Severely limited study by body habitus. Very difficult to visualize the NG tube. There appears to be a tube coiling within the stomach. IMPRESSION: Very limited study. There appears to be in NG tube coiling in the stomach. Electronically Signed   By: Charlett Nose M.D.   On: 10/05/2015 20:59   Dg Chest Port 1 View  10/17/2015  CLINICAL DATA:  Pt had trach placed this am; pt has  been in hospital since December for multiple problems; morbidly obese, smoker, afib EXAM: PORTABLE CHEST 1 VIEW COMPARISON:  10/03/2015 FINDINGS: Tracheostomy tube projects over the tracheal air column with tip 4.5 cm above the carina. Left internal jugular central line tip projects over the superior vena cava. Study limited by body habitus. Severe cardiac enlargement. Vascular congestion, moderate. Right lung otherwise clear. Patchy left upper lobe opacity consistent with infiltrate. IMPRESSION: Lines and tubes as described with severe cardiac enlargement. Vascular congestion persists. Focal left upper lobe airspace disease likely represents residual clearing pulmonary edema. Pneumonia not excluded and follow-up recommended. Electronically Signed   By: Esperanza Heir M.D.   On: Oct 09, 2015 18:43    ASSESSMENT AND PLAN:   40 year old male with a history of morbid obesity and tobacco abuse who presented with lower extremity edema and found to have bilateral pneumonia.  1. Acute hypoxic hypercapnic respiratory failure-: This is multifactorial  due to obesity hypoventilation syndrome, community-acquired pneumonia and pulmonary edema versus ARDS.  -Intubated and on ventilator. Tracheostomy done on 09-Oct-2015. -Appreciate pulmonary consult. -Continues to be hypoxic, FiO2 at 60%. PEEP 8 -Continue antibiotics for his pneumonia. -On neb treatments.  2. Bilateral community-acquired pneumonia with septic shock -was on vancomycin and ceftazidime and he had completed course of treatment.   - off pressors. - f/u CXR today   3.New onset systolic congestive heart failure,  EF 45%  - had elevated BNP, edema and vascular congestion on chest x-ray  On lasix drip, however has worsening renal function  -Follow up repeat chest x-ray.  4. Acute renal failure - Due to ATN and diuresis -Appreciate nephrology consult -Started on Lasix drip with no improvement. Urine output still remains extremely low with worsening  BUN and creatinine. -Might need hemodialysis.  5. Nutrition-Currently has an NG tube with tube feeds. Will need a PEG tube. - due to his large body habitus,GI cannot do it endoscopically, discussed with IR team-patient will be too heavy for a CT table and also the fluoroscopic table due to his weight. So cannot be done by interventional radiology.  -Patient will need to have an open procedure for his PEG tube placement by surgical team, however will be a high risk procedure considering his body habitus and also his other multiple medical problems. -Need to discuss with family before considering.  6. Morbid obesity will need bariatric referral as outpatient  7.Hyperbilirubinemia could be passive congestion from congestive heart failure. hepatitis panel and ultrasound of right upper quadrant suggestive of cirrhotic changes. US showed Cirrhosis  8. Atrial fibrillation, -known history of paroxysmal atrial fibrillation. Now in A. fib with RVR and on amiodarone drip. -Change amiodarone to oral today -Appreciate cardiology consult. -Risk of long-term full dose anticoagulation outweighed benefit at this time as per cardiac.   CODE STATUS: full.  TOTAL CRITICAL CARE TIME SPENT IN TAKING CARE OF THIS PATIENT: 42 critical care minutes.   He remains critically ill and high risk for cardiopulmonary arrest.    Enid Baas M.D on 10/06/2015   Between 7am to 6pm - Pager - [phone]  After 6pm go to [email or web address] - password [email or web address]  [facility]  Office  [phone]  CC: Primary care physician; No PCP Per Patient  Note: This dictation was prepared with Dragon dictation along with smaller phrase technology. Any transcriptional errors that result from this process are unintentional.

## 2015-10-06 NOTE — Progress Notes (Signed)
Nutrition Follow-up     INTERVENTION:  EN: continue titrating vital high protein to goal rate of 65ml/hr to meet nutritional needs. Bowel regimen being adjusted   NUTRITION DIAGNOSIS:   Inadequate oral intake related to inability to eat as evidenced by NPO status.    GOAL:   Patient will meet greater than or equal to 90% of their needs    MONITOR:    (Energy Intake, Digestive System, Pulmonary Profile, Electrolyte and Renal Profile, Anthropometrics)  REASON FOR ASSESSMENT:   Ventilator, Consult Enteral/tube feeding initiation and management  ASSESSMENT:     Pt remains on vent with NG tube in place, s/p trach.  Surgery and GI notes reviewed regarding PEG tube placement Planning CT of abdomen this am   Current Nutrition: tolerating vital high protein via NG tube at 12ml/hr, goal rate 35ml/hr    Scheduled Medications:  . albumin human  25 g Intravenous Q12H  . amiodarone  400 mg Oral BID  . antiseptic oral rinse  7 mL Mouth Rinse 10 times per day  . budesonide  0.25 mg Nebulization 4 times per day  . chlorhexidine gluconate  15 mL Mouth Rinse BID  . enoxaparin (LOVENOX) injection  40 mg Subcutaneous Q12H  . famotidine  20 mg Per Tube BID  . feeding supplement (PRO-STAT SUGAR FREE 64)  30 mL Per Tube TID  . feeding supplement (VITAL HIGH PROTEIN)  1,000 mL Per Tube Q24H  . free water  30 mL Per Tube 6 times per day  . Influenza vac split quadrivalent PF  0.5 mL Intramuscular Tomorrow-1000  . ipratropium-albuterol  3 mL Nebulization Q6H  . metoprolol  5 mg Intravenous 4 times per day  . nystatin   Topical BID  . sennosides  5 mL Oral BID    Continuous Medications:  . fentaNYL infusion INTRAVENOUS 100 mcg/hr (10/06/15 0100)  . furosemide (LASIX) infusion 5 mg/hr (10/05/15 1449)     Electrolyte/Renal Profile and Glucose Profile:   Recent Labs Lab 09/22/2015 0607 10/05/15 0532 10/06/15 0500  NA 143 140 141  K 4.4 4.8 4.8  CL 104 102 105  CO2 BUN 99* 95* 113*  CREATININE 2.03* 2.83* 3.27*  CALCIUM 8.5* 8.6* 8.9  GLUCOSE 119* 121* 131*   Protein Profile:  Recent Labs Lab 10/06/15 0500  ALBUMIN 2.9*     Diet Order:   NPO  Skin:  Reviewed, no issues  Last BM:  1/12  Height:   Ht Readings from Last 1 Encounters:  09/20/15  (1.753 m)    Weight:   Wt Readings from Last 1 Encounters:  10/06/15 636 lb 12.8 oz (288.851 kg)    Ideal Body Weight:     BMI:  Body mass index is 94 kg/(m^2).  Estimated Nutritional Needs:   Kcal:  1600-1817kcals (22-25kcals/kg) using  IBW of 72.7kg  Protein:  145-181g protein (2.0-2.5g/kg) using IBW of 72.7kg  Fluid:  2181-2563mL of fluid (30-53mL/kg)  EDUCATION NEEDS:   No education needs identified at this time  HIGH Care Level  Fredricka Kohrs B. Freida Busman, RD, LDN [phone] (pager) Weekend/On-Call pager [phone])

## 2015-10-06 NOTE — Op Note (Signed)
  OPERATIVE NOTE   PROCEDURE: 1. Insertion of temporary dialysis catheter catheter right internal jugular approach.  PRE-OPERATIVE DIAGNOSIS: Acute renal failure, acidosis  POST-OPERATIVE DIAGNOSIS: Same  SURGEON: Renford Dills M.D.  ANESTHESIA: 1% lidocaine local infiltration  ESTIMATED BLOOD LOSS: Minimal cc  INDICATIONS:   Mitchell Morrow is a 40 y.o. male who presents with multisystem organ failure and now worsening renal function will require hemodialysis.  DESCRIPTION: After obtaining full informed written consent, the patient was positioned supine. The right neck was prepped and draped in a sterile fashion. Ultrasound was placed in a sterile sleeve. Ultrasound was utilized to identify the right internal jugular vein which is noted to be echolucent and compressible indicating patency. Images recorded for the permanent record. Under real-time visualization a Seldinger needle is inserted into the vein and the guidewires advanced without difficulty. Small counterincision was made at the wire insertion site. Dilator is passed over the wire and the temporary dialysis catheter catheter is fed over the wire without difficulty.  All lumens aspirate and flush easily and are packed with heparin saline. Catheter secured to the skin of the right neck with 2-0 silk. A sterile dressing is applied with Biopatch.  COMPLICATIONS: None  CONDITION: Unchanged  Renford Dills, M.D. [facility] Office:  [phone]

## 2015-10-06 NOTE — Progress Notes (Signed)
[facility]  ROUNDING NOTE   Subjective:  Very poor response to lasix gtt.  UOP only 90cc over the preceding 24 hours. Massive volume overload noted. Patient remains on the ventilator at this time. Case discussed with the patient's mother. She is agreeable to proceeding with renal replacement therapy.    Objective:  Vital signs in last 24 hours:  Temp:  [97 F (36.1 C)-99.3 F (37.4 C)] 97 F (36.1 C) (01/18 0700) Pulse Rate:  [90-102] 90 (01/18 0700) Resp:  [11-26] 11 (01/17 1700) BP: (87-96)/(58-74) 94/62 mmHg (01/18 0700) SpO2:  [93 %-98 %] 95 % (01/18 0836) FiO2 (%):  [60 %-70 %] 60 % (01/18 0836) Weight:  [288.851 kg (636 lb 12.8 oz)] 288.851 kg (636 lb 12.8 oz) (01/18 0500)  Weight change: 1.043 kg (2 lb 4.8 oz) Filed Weights   10/16/2015 0500 10/05/15 0500 10/06/15 0500  Weight: 288.941 kg (637 lb) 287.807 kg (634 lb 8 oz) 288.851 kg (636 lb 12.8 oz)    Intake/Output: I/O last 3 completed shifts: In: 3774.2 [I.V.:2408.2; NG/GT:566; IV Piggyback:800] Out: 215 [Urine:215]   Intake/Output this shift:  Total I/O In: 30 [NG/GT:30] Out: -   Physical Exam: General: Critically ill  Head: [place]/AT difficult to assess hearing  Eyes: Mild icterus noted  Neck: Tracheostomy in place  Lungs:  Difficult exam, air entry present, vent assisted, fio2 60%  Heart: S1S2 no rubs  Abdomen:  Soft, nontender, obese  Extremities: 3+ generalized edema.  Neurologic: Awake but not following commands  Skin: No lesions  GU: foley    Basic Metabolic Panel:  Recent Labs Lab 10/02/15 0305 10/03/15 0434 10/05/2015 0607 10/05/15 0532 10/06/15 0500  NA 143 144 143 140 141  K 3.9 4.1 4.4 4.8 4.8  CL 103 104 104 102 105  CO2 36* 33* 31 28 28   GLUCOSE 119* 129* 119* 121* 131*  BUN 79* 82* 99* 95* 113*  CREATININE 1.50* 1.56* 2.03* 2.83* 3.27*  CALCIUM 8.4* 8.5* 8.5* 8.6* 8.9    Liver Function Tests:  Recent Labs Lab 10/06/15 0500  AST 41  ALT 13*  ALKPHOS 74   BILITOT 3.6*  PROT 7.0  ALBUMIN 2.9*   No results for input(s): LIPASE, AMYLASE in the last 168 hours. No results for input(s): AMMONIA in the last 168 hours.  CBC:  Recent Labs Lab 10/01/15 0426 10/02/15 0305 09/23/2015 0607 10/05/15 0532 10/06/15 0500  WBC 11.5* 12.3* 10.3 10.7* 9.4  HGB 11.8* 11.3* 11.4* 11.6* 10.9*  HCT 38.7* 37.3* 38.5* 38.7* 37.0*  MCV 83.7 84.0 86.4 86.4 85.0  PLT 170 154 137* 137* 130*    Cardiac Enzymes: No results for input(s): CKTOTAL, CKMB, CKMBINDEX, TROPONINI in the last 168 hours.  BNP: Invalid input(s): POCBNP  CBG:  Recent Labs Lab 10/05/15 1701 10/05/15 1947 10/06/15 0011 10/06/15 0359 10/06/15 0720  GLUCAP 106* 111* 114* 135* 109*    Microbiology: Results for orders placed or performed during the hospital encounter of 09-28-15  Blood culture (routine x 2)     Status: None   Collection Time: September 28, 2015 11:53 PM  Result Value Ref Range Status   Specimen Description BLOOD LEFT HAND  Final   Special Requests BOTTLES DRAWN AEROBIC AND ANAEROBIC 4CC  Final   Culture NO GROWTH 5 DAYS  Final   Report Status 09/23/2015 FINAL  Final  Blood culture (routine x 2)     Status: None   Collection Time: 2015/09/28 11:53 PM  Result Value Ref Range Status  Specimen Description BLOOD LEFT ARM  Final   Special Requests BOTTLES DRAWN AEROBIC AND ANAEROBIC 5CC  Final   Culture NO GROWTH 5 DAYS  Final   Report Status 09/23/2015 FINAL  Final  Urine culture     Status: None   Collection Time: 09/18/15 12:21 AM  Result Value Ref Range Status   Specimen Description URINE, RANDOM  Final   Special Requests NONE  Final   Culture MULTIPLE SPECIES PRESENT, SUGGEST RECOLLECTION  Final   Report Status 09/19/2015 FINAL  Final  Rapid Influenza A&B Antigens ([facility] only)     Status: None   Collection Time: 09/18/15  6:26 AM  Result Value Ref Range Status   Influenza A ([facility]) NOT DETECTED  Final   Influenza B ([facility]) NOT DETECTED  Final  MRSA PCR Screening      Status: None   Collection Time: 09/20/15  6:30 AM  Result Value Ref Range Status   MRSA by PCR NEGATIVE NEGATIVE Final    Comment:        The GeneXpert MRSA Assay (FDA approved for NASAL specimens only), is one component of a comprehensive MRSA colonization surveillance program. It is not intended to diagnose MRSA infection nor to guide or monitor treatment for MRSA infections.   Culture, respiratory (NON-Expectorated)     Status: None   Collection Time: 09/22/15  3:13 PM  Result Value Ref Range Status   Specimen Description TRACHEAL ASPIRATE  Final   Special Requests NONE  Final   Gram Stain   Final    GOOD SPECIMEN - 80-90% WBCS MODERATE WBC SEEN RARE YEAST RARE GRAM NEGATIVE COCCOBACILLI    Culture LIGHT GROWTH CANDIDA TROPICALIS  Final   Report Status 09/26/2015 FINAL  Final  Urine culture     Status: None   Collection Time: 09/22/15 11:16 PM  Result Value Ref Range Status   Specimen Description URINE, RANDOM  Final   Special Requests NONE  Final   Culture NO GROWTH 2 DAYS  Final   Report Status 09/24/2015 FINAL  Final  CULTURE, BLOOD (ROUTINE X 2) w Reflex to PCR ID Panel     Status: None   Collection Time: 09/22/15 11:44 PM  Result Value Ref Range Status   Specimen Description BLOOD RIGHT ANTECUBITAL  Final   Special Requests BOTTLES DRAWN AEROBIC AND ANAEROBIC  Final   Culture NO GROWTH 5 DAYS  Final   Report Status 09/28/2015 FINAL  Final  CULTURE, BLOOD (ROUTINE X 2) w Reflex to PCR ID Panel     Status: None   Collection Time: 09/22/15 11:56 PM  Result Value Ref Range Status   Specimen Description BLOOD LEFT HAND  Final   Special Requests BOTTLES DRAWN AEROBIC AND ANAEROBIC  Final   Culture NO GROWTH 5 DAYS  Final   Report Status 09/28/2015 FINAL  Final  Culture, respiratory (NON-Expectorated)     Status: None   Collection Time: 09/23/15 12:01 AM  Result Value Ref Range Status   Specimen Description TRACHEAL ASPIRATE  Final   Special Requests  NONE  Final   Gram Stain   Final    GOOD SPECIMEN - 80-90% WBCS MODERATE WBC SEEN RARE YEAST RARE GRAM POSITIVE COCCI    Culture Consistent with normal respiratory flora.  Final   Report Status 09/25/2015 FINAL  Final  Urine culture     Status: None   Collection Time: 10/01/15 10:35 AM  Result Value Ref Range Status   Specimen Description URINE,  RANDOM  Final   Special Requests NONE  Final   Culture NO GROWTH 2 DAYS  Final   Report Status 10/03/2015 FINAL  Final  CULTURE, BLOOD (ROUTINE X 2) w Reflex to PCR ID Panel     Status: None (Preliminary result)   Collection Time: 10/01/15 10:56 AM  Result Value Ref Range Status   Specimen Description BLOOD RIGHT ARM  Final   Special Requests   Final    BOTTLES DRAWN AEROBIC AND ANAEROBIC  AER 3CC ANA 5CC   Culture NO GROWTH 4 DAYS  Final   Report Status PENDING  Incomplete  CULTURE, BLOOD (ROUTINE X 2) w Reflex to PCR ID Panel     Status: None   Collection Time: 10/01/15 11:14 AM  Result Value Ref Range Status   Specimen Description BLOOD LEFT HAND  Final   Special Requests   Final    BOTTLES DRAWN AEROBIC AND ANAEROBIC  AER 3CC ANA 4CC   Culture  Setup Time   Final    GRAM POSITIVE COCCI ANAEROBIC BOTTLE ONLY CRITICAL RESULT CALLED TO, READ BACK BY AND VERIFIED WITH: CRYSTAL SCARPENA AT 0915 ON 10/02/15 CTJ    Culture   Final    COAGULASE NEGATIVE STAPHYLOCOCCUS ANAEROBIC BOTTLE ONLY Results consistent with contamination.    Report Status 10/06/2015 FINAL  Final  Blood Culture ID Panel (Reflexed)     Status: Abnormal   Collection Time: 10/01/15 11:14 AM  Result Value Ref Range Status   Enterococcus species NOT DETECTED NOT DETECTED Final   Listeria monocytogenes NOT DETECTED NOT DETECTED Final   Staphylococcus species DETECTED (A) NOT DETECTED Final   Staphylococcus aureus NOT DETECTED NOT DETECTED Final   Streptococcus species NOT DETECTED NOT DETECTED Final   Streptococcus agalactiae NOT DETECTED NOT DETECTED Final    Streptococcus pneumoniae NOT DETECTED NOT DETECTED Final   Streptococcus pyogenes NOT DETECTED NOT DETECTED Final   Acinetobacter baumannii NOT DETECTED NOT DETECTED Final   Enterobacteriaceae species NOT DETECTED NOT DETECTED Final   Enterobacter cloacae complex NOT DETECTED NOT DETECTED Final   Escherichia coli NOT DETECTED NOT DETECTED Final   Klebsiella oxytoca NOT DETECTED NOT DETECTED Final   Klebsiella pneumoniae NOT DETECTED NOT DETECTED Final   Proteus species NOT DETECTED NOT DETECTED Final   Serratia marcescens NOT DETECTED NOT DETECTED Final   Haemophilus influenzae NOT DETECTED NOT DETECTED Final   Neisseria meningitidis NOT DETECTED NOT DETECTED Final   Pseudomonas aeruginosa NOT DETECTED NOT DETECTED Final   Candida albicans NOT DETECTED NOT DETECTED Final   Candida glabrata NOT DETECTED NOT DETECTED Final   Candida krusei NOT DETECTED NOT DETECTED Final   Candida parapsilosis NOT DETECTED NOT DETECTED Final   Candida tropicalis NOT DETECTED NOT DETECTED Final   Carbapenem resistance NOT DETECTED NOT DETECTED Final   Methicillin resistance DETECTED (A) NOT DETECTED Final    Comment: CRITICAL RESULT CALLED TO, READ BACK BY AND VERIFIED WITH: CRYSTAL SCARPENA FOR STAPHYLOCOCCUS SPECIES AND METHICILLIN RESISTANCE AT 0915 ON 10/02/15 CTJ    Vancomycin resistance NOT DETECTED NOT DETECTED Final  Culture, expectorated sputum-assessment     Status: None   Collection Time: 10/01/15 11:51 AM  Result Value Ref Range Status   Specimen Description EXPECTORATED SPUTUM  Final   Special Requests Normal  Final   Sputum evaluation THIS SPECIMEN IS ACCEPTABLE FOR SPUTUM CULTURE  Final   Report Status 10/01/2015 FINAL  Final  Culture, respiratory (NON-Expectorated)     Status: None (Preliminary result)  Collection Time: 10/01/15 11:51 AM  Result Value Ref Range Status   Specimen Description EXPECTORATED SPUTUM  Final   Special Requests Normal Reflexed from [id number]  Final   Gram Stain    Final    GOOD SPECIMEN - 80-90% WBCS MODERATE WBC SEEN FEW YEAST    Culture HOLDING FOR POSSIBLE PATHOGEN  Final   Report Status PENDING  Incomplete    Coagulation Studies: No results for input(s): LABPROT, INR in the last 72 hours.  Urinalysis: No results for input(s): COLORURINE, LABSPEC, PHURINE, GLUCOSEU, HGBUR, BILIRUBINUR, KETONESUR, PROTEINUR, UROBILINOGEN, NITRITE, LEUKOCYTESUR in the last 72 hours.  Invalid input(s): APPERANCEUR    Imaging: Dg Chest 1 View  10/05/2015  CLINICAL DATA:  Dyspnea. EXAM: CHEST 1 VIEW COMPARISON:  October 04, 2015. FINDINGS: Tracheostomy is in grossly good position. Hypoinflation of the lungs is noted. Left internal jugular catheter is noted with distal tip in expected position of SVC. Stable cardiomegaly. No pneumothorax or significant pleural effusion is noted. Slightly increased right upper lobe opacity is noted concerning for atelectasis or possibly pneumonia. Stable left upper lobe opacity is noted. Bony thorax is unremarkable. IMPRESSION: Right upper lobe opacity is noted which is slightly increased suggesting worsening pneumonia or atelectasis. Stable left upper lobe opacity is noted. Hypoinflation of the lungs is noted. Electronically Signed   By: Lupita Raider, M.D.   On: 10/05/2015 07:50   Dg Abd 1 View  10/05/2015  CLINICAL DATA:  NG tube placement. EXAM: ABDOMEN - 1 VIEW COMPARISON:  09/25/2015 FINDINGS: Severely limited study by body habitus. Very difficult to visualize the NG tube. There appears to be a tube coiling within the stomach. IMPRESSION: Very limited study. There appears to be in NG tube coiling in the stomach. Electronically Signed   By: Charlett Nose M.D.   On: 10/05/2015 20:59   Dg Chest Port 1 View  10/06/2015  CLINICAL DATA:  CHF.  Pneumonia. EXAM: PORTABLE CHEST 1 VIEW COMPARISON:  10/05/2015 FINDINGS: Tracheostomy in good position. NG tube not well visualized due to underpenetration. Cardiac enlargement. Congestive heart  failure with bilateral edema has progressed in the interval. Progression of bibasilar atelectasis with hypoventilation noted. IMPRESSION: Progression of congestive heart failure with edema. Progression of bibasilar atelectasis. Electronically Signed   By: Marlan Palau M.D.   On: 10/06/2015 08:07   Dg Chest Port 1 View  10/03/2015  CLINICAL DATA:  Pt had trach placed this am; pt has been in hospital since December for multiple problems; morbidly obese, smoker, afib EXAM: PORTABLE CHEST 1 VIEW COMPARISON:  10/03/2015 FINDINGS: Tracheostomy tube projects over the tracheal air column with tip 4.5 cm above the carina. Left internal jugular central line tip projects over the superior vena cava. Study limited by body habitus. Severe cardiac enlargement. Vascular congestion, moderate. Right lung otherwise clear. Patchy left upper lobe opacity consistent with infiltrate. IMPRESSION: Lines and tubes as described with severe cardiac enlargement. Vascular congestion persists. Focal left upper lobe airspace disease likely represents residual clearing pulmonary edema. Pneumonia not excluded and follow-up recommended. Electronically Signed   By: Esperanza Heir M.D.   On: 10/13/2015 18:43     Medications:   . fentaNYL infusion INTRAVENOUS 100 mcg/hr (10/06/15 0100)  . furosemide (LASIX) infusion 5 mg/hr (10/05/15 1449)   . albumin human  25 g Intravenous Q12H  . amiodarone  400 mg Oral BID  . antiseptic oral rinse  7 mL Mouth Rinse 10 times per day  . budesonide  0.25 mg Nebulization 4  times per day  . chlorhexidine gluconate  15 mL Mouth Rinse BID  . enoxaparin (LOVENOX) injection  40 mg Subcutaneous Q12H  . famotidine  20 mg Per Tube BID  . feeding supplement (PRO-STAT SUGAR FREE 64)  30 mL Per Tube TID  . feeding supplement (VITAL HIGH PROTEIN)  1,000 mL Per Tube Q24H  . free water  30 mL Per Tube 6 times per day  . Influenza vac split quadrivalent PF  0.5 mL Intramuscular Tomorrow-1000  .  ipratropium-albuterol  3 mL Nebulization Q6H  . metoprolol  5 mg Intravenous 4 times per day  . nystatin   Topical BID  . sennosides  5 mL Oral BID   acetaminophen **OR** acetaminophen, albuterol, bisacodyl, calcium carbonate, fentaNYL (SUBLIMAZE) injection, ibuprofen, LORazepam, ondansetron **OR** ondansetron (ZOFRAN) IV, sodium chloride  Assessment/ Plan:  Mr. Dutch Ing is a 40 y.o. black male with morbid obesity, tobacco abuse, without prescribed home medications, who was admitted to [facility] on 09/25/2015, s/p tracheostomy placement, persistent respiratory failure, multiple episodes of ARF  1. Acute Renal Failure:  Second episode of ARF, reconsulted 10/05/15, hypotension contributing now.  -  Renal function continues to worsen. Patient has also developed oliguria. Case discussed with the patient's mother. We offered renal replacement therapy as an option to treat underlying renal failure and gross volume overload. Patient's mother has consented for this. We will consult vascular surgery for temporary dialysis catheter placement. Hopefully this can be placed in an internal jugular vein. We will proceed with continuous renal replacement therapy thereafter.   2. Acute respiratory failure with bilateral pneumonia and acute exacerbation of systolic congestive heart failure: Continue ventilatory support..    3.  Generalized edema:  Discontinue Lasix drip. We will proceed with ultrafiltration with continuous renal replacement therapy. We will set an initial ultrafiltration target of 50 cc per hour.   4.  Hypotension: Blood pressure remains low. Consider adding pressor therapy for worsening blood pressure.   LOS: 19 Dnaiel Voller 1/18/20179:12 AM

## 2015-10-06 NOTE — Progress Notes (Signed)
PHARMACY - CRITICAL CARE PROGRESS NOTE  Pharmacy Consult for CRRT Dosing of Medications    No Known Allergies  Patient Measurements: Height:  (175.3 cm) Weight: (!) 636 lb 12.8 oz (288.851 kg) IBW/kg (Calculated) : 70.7   Vital Signs: Temp: 97.8 F (36.6 C) (01/18 1200) Temp Source: Axillary (01/18 1200) BP: 97/72 mmHg (01/18 1500) Pulse Rate: 90 (01/18 1500) Intake/Output from previous day: 01/17 0701 - 01/18 0700 In: 2720.1 [I.V.:1404.1; NG/GT:616; IV Piggyback:700] Out: 90 [Urine:90] Intake/Output from this shift: Total I/O In: 518.5 [I.V.:138.5; NG/GT:380] Out: -  Vent settings for last 24 hours: Vent Mode:  [-] PRVC FiO2 (%):  [60 %-70 %] 60 % Set Rate:  [25 bmp] 25 bmp Vt Set:  [500 mL] 500 mL PEEP:  [8 cmH20] 8 cmH20 Plateau Pressure:  [22 cmH20] 22 cmH20  Labs:  Recent Labs  10/17/2015 0607 10/05/15 0532 10/06/15 0500  WBC 10.3 10.7* 9.4  HGB 11.4* 11.6* 10.9*  HCT 38.5* 38.7* 37.0*  PLT 137* 137* 130*  CREATININE 2.03* 2.83* 3.27*  ALBUMIN  --   --  2.9*  PROT  --   --  7.0  AST  --   --  41  ALT  --   --  13*  ALKPHOS  --   --  74  BILITOT  --   --  3.6*   Estimated Creatinine Clearance: 66.4 mL/min (by C-G formula based on Cr of 3.27).   Recent Labs  10/06/15 0359 10/06/15 0720 10/06/15 1112  GLUCAP 135* 109* 129*    Microbiology: Recent Results (from the past 720 hour(s))  Blood culture (routine x 2)     Status: None   Collection Time: Sep 18, 2015 11:53 PM  Result Value Ref Range Status   Specimen Description BLOOD LEFT HAND  Final   Special Requests BOTTLES DRAWN AEROBIC AND ANAEROBIC 4CC  Final   Culture NO GROWTH 5 DAYS  Final   Report Status 09/23/2015 FINAL  Final  Blood culture (routine x 2)     Status: None   Collection Time: 09-18-15 11:53 PM  Result Value Ref Range Status   Specimen Description BLOOD LEFT ARM  Final   Special Requests BOTTLES DRAWN AEROBIC AND ANAEROBIC 5CC  Final   Culture NO GROWTH 5 DAYS  Final    Report Status 09/23/2015 FINAL  Final  Urine culture     Status: None   Collection Time: 09/18/15 12:21 AM  Result Value Ref Range Status   Specimen Description URINE, RANDOM  Final   Special Requests NONE  Final   Culture MULTIPLE SPECIES PRESENT, SUGGEST RECOLLECTION  Final   Report Status 09/19/2015 FINAL  Final  Rapid Influenza A&B Antigens ([facility] only)     Status: None   Collection Time: 09/18/15  6:26 AM  Result Value Ref Range Status   Influenza A ([facility]) NOT DETECTED  Final   Influenza B ([facility]) NOT DETECTED  Final  MRSA PCR Screening     Status: None   Collection Time: 09/20/15  6:30 AM  Result Value Ref Range Status   MRSA by PCR NEGATIVE NEGATIVE Final    Comment:        The GeneXpert MRSA Assay (FDA approved for NASAL specimens only), is one component of a comprehensive MRSA colonization surveillance program. It is not intended to diagnose MRSA infection nor to guide or monitor treatment for MRSA infections.   Culture, respiratory (NON-Expectorated)     Status: None   Collection Time: 09/22/15  3:13 PM  Result Value Ref Range Status   Specimen Description TRACHEAL ASPIRATE  Final   Special Requests NONE  Final   Gram Stain   Final    GOOD SPECIMEN - 80-90% WBCS MODERATE WBC SEEN RARE YEAST RARE GRAM NEGATIVE COCCOBACILLI    Culture LIGHT GROWTH CANDIDA TROPICALIS  Final   Report Status 09/26/2015 FINAL  Final  Urine culture     Status: None   Collection Time: 09/22/15 11:16 PM  Result Value Ref Range Status   Specimen Description URINE, RANDOM  Final   Special Requests NONE  Final   Culture NO GROWTH 2 DAYS  Final   Report Status 09/24/2015 FINAL  Final  CULTURE, BLOOD (ROUTINE X 2) w Reflex to PCR ID Panel     Status: None   Collection Time: 09/22/15 11:44 PM  Result Value Ref Range Status   Specimen Description BLOOD RIGHT ANTECUBITAL  Final   Special Requests BOTTLES DRAWN AEROBIC AND ANAEROBIC  Final   Culture NO GROWTH 5 DAYS  Final   Report  Status 09/28/2015 FINAL  Final  CULTURE, BLOOD (ROUTINE X 2) w Reflex to PCR ID Panel     Status: None   Collection Time: 09/22/15 11:56 PM  Result Value Ref Range Status   Specimen Description BLOOD LEFT HAND  Final   Special Requests BOTTLES DRAWN AEROBIC AND ANAEROBIC  Final   Culture NO GROWTH 5 DAYS  Final   Report Status 09/28/2015 FINAL  Final  Culture, respiratory (NON-Expectorated)     Status: None   Collection Time: 09/23/15 12:01 AM  Result Value Ref Range Status   Specimen Description TRACHEAL ASPIRATE  Final   Special Requests NONE  Final   Gram Stain   Final    GOOD SPECIMEN - 80-90% WBCS MODERATE WBC SEEN RARE YEAST RARE GRAM POSITIVE COCCI    Culture Consistent with normal respiratory flora.  Final   Report Status 09/25/2015 FINAL  Final  Urine culture     Status: None   Collection Time: 10/01/15 10:35 AM  Result Value Ref Range Status   Specimen Description URINE, RANDOM  Final   Special Requests NONE  Final   Culture NO GROWTH 2 DAYS  Final   Report Status 10/03/2015 FINAL  Final  CULTURE, BLOOD (ROUTINE X 2) w Reflex to PCR ID Panel     Status: None   Collection Time: 10/01/15 10:56 AM  Result Value Ref Range Status   Specimen Description BLOOD RIGHT ARM  Final   Special Requests   Final    BOTTLES DRAWN AEROBIC AND ANAEROBIC  AER 3CC ANA 5CC   Culture NO GROWTH 5 DAYS  Final   Report Status 10/06/2015 FINAL  Final  CULTURE, BLOOD (ROUTINE X 2) w Reflex to PCR ID Panel     Status: None   Collection Time: 10/01/15 11:14 AM  Result Value Ref Range Status   Specimen Description BLOOD LEFT HAND  Final   Special Requests   Final    BOTTLES DRAWN AEROBIC AND ANAEROBIC  AER 3CC ANA 4CC   Culture  Setup Time   Final    GRAM POSITIVE COCCI ANAEROBIC BOTTLE ONLY CRITICAL RESULT CALLED TO, READ BACK BY AND VERIFIED WITH: CRYSTAL SCARPENA AT 0915 ON 10/02/15 CTJ    Culture   Final    COAGULASE NEGATIVE STAPHYLOCOCCUS ANAEROBIC BOTTLE ONLY Results consistent  with contamination.    Report Status 10/06/2015 FINAL  Final  Blood Culture ID Panel (  Reflexed)     Status: Abnormal   Collection Time: 10/01/15 11:14 AM  Result Value Ref Range Status   Enterococcus species NOT DETECTED NOT DETECTED Final   Listeria monocytogenes NOT DETECTED NOT DETECTED Final   Staphylococcus species DETECTED (A) NOT DETECTED Final   Staphylococcus aureus NOT DETECTED NOT DETECTED Final   Streptococcus species NOT DETECTED NOT DETECTED Final   Streptococcus agalactiae NOT DETECTED NOT DETECTED Final   Streptococcus pneumoniae NOT DETECTED NOT DETECTED Final   Streptococcus pyogenes NOT DETECTED NOT DETECTED Final   Acinetobacter baumannii NOT DETECTED NOT DETECTED Final   Enterobacteriaceae species NOT DETECTED NOT DETECTED Final   Enterobacter cloacae complex NOT DETECTED NOT DETECTED Final   Escherichia coli NOT DETECTED NOT DETECTED Final   Klebsiella oxytoca NOT DETECTED NOT DETECTED Final   Klebsiella pneumoniae NOT DETECTED NOT DETECTED Final   Proteus species NOT DETECTED NOT DETECTED Final   Serratia marcescens NOT DETECTED NOT DETECTED Final   Haemophilus influenzae NOT DETECTED NOT DETECTED Final   Neisseria meningitidis NOT DETECTED NOT DETECTED Final   Pseudomonas aeruginosa NOT DETECTED NOT DETECTED Final   Candida albicans NOT DETECTED NOT DETECTED Final   Candida glabrata NOT DETECTED NOT DETECTED Final   Candida krusei NOT DETECTED NOT DETECTED Final   Candida parapsilosis NOT DETECTED NOT DETECTED Final   Candida tropicalis NOT DETECTED NOT DETECTED Final   Carbapenem resistance NOT DETECTED NOT DETECTED Final   Methicillin resistance DETECTED (A) NOT DETECTED Final    Comment: CRITICAL RESULT CALLED TO, READ BACK BY AND VERIFIED WITH: CRYSTAL SCARPENA FOR STAPHYLOCOCCUS SPECIES AND METHICILLIN RESISTANCE AT 0915 ON 10/02/15 CTJ    Vancomycin resistance NOT DETECTED NOT DETECTED Final  Culture, expectorated sputum-assessment     Status: None    Collection Time: 10/01/15 11:51 AM  Result Value Ref Range Status   Specimen Description EXPECTORATED SPUTUM  Final   Special Requests Normal  Final   Sputum evaluation THIS SPECIMEN IS ACCEPTABLE FOR SPUTUM CULTURE  Final   Report Status 10/01/2015 FINAL  Final  Culture, respiratory (NON-Expectorated)     Status: None   Collection Time: 10/01/15 11:51 AM  Result Value Ref Range Status   Specimen Description EXPECTORATED SPUTUM  Final   Special Requests Normal Reflexed from [id number]  Final   Gram Stain   Final    GOOD SPECIMEN - 80-90% WBCS MODERATE WBC SEEN FEW YEAST    Culture   Final    LIGHT GROWTH CANDIDA ALBICANS LIGHT GROWTH CANDIDA DUBLINIENSIS    Report Status 10/06/2015 FINAL  Final    Medications:  Scheduled:  . albumin human  25 g Intravenous Q12H  . amiodarone  400 mg Oral BID  . antiseptic oral rinse  7 mL Mouth Rinse 10 times per day  . budesonide  0.25 mg Nebulization 4 times per day  . chlorhexidine gluconate  15 mL Mouth Rinse BID  . docusate  100 mg Oral BID  . escitalopram  5 mg Oral Daily  . famotidine  20 mg Per Tube BID  . feeding supplement (PRO-STAT SUGAR FREE 64)  30 mL Per Tube TID  . feeding supplement (VITAL HIGH PROTEIN)  1,000 mL Per Tube Q24H  . free water  30 mL Per Tube 6 times per day  . heparin subcutaneous  5,000 Units Subcutaneous 3 times per day  . Influenza vac split quadrivalent PF  0.5 mL Intramuscular Tomorrow-1000  . ipratropium-albuterol  3 mL Nebulization Q6H  . metoprolol  5  mg Intravenous 4 times per day  . nystatin   Topical BID  . sennosides  5 mL Oral BID   Infusions:  . fentaNYL infusion INTRAVENOUS 100 mcg/hr (10/06/15 1342)  . furosemide (LASIX) infusion 5 mg/hr (10/06/15 0700)    Assessment: 40 y/o M admitted with PNA nad new-onset CHF with plans to begin CRRT due to ARF once temporary dialysis catheter placed.   Plan:  Will f/u and adjust medications once CRRT started.   Luisa Hart D 10/06/2015,3:40  PM

## 2015-10-06 NOTE — Progress Notes (Signed)
PULMONARY/CCM PROGRESS NOTE  40 year old massively obese male with acute CHF, volume overload. Now with acute renal failure, vent dependent respiratory failure status post trach.  IMPRESSION: 1) Acute on chronic hypercarbic respiratory failure , suspect status post ARDS, as well as some degree of pulmonary edema. 2) Obesity hypoventilation syndrome 3) Atelectasis 7) now with oliguric renal failure. The patient is seen by nephrology, he's been placed on a Lasix drip with some suboptimal response. Acute systolic CHF with volume overload.   PLAN/RECS:  Cont vent support - FiO2 requirements are reduced today. We will continue to wean down as tolerated. Chest x-ray from 1/18 reviewed, minimally changed from yesterday. Nephro following, currently on Lasix drip: The patient may require dialysis. Neither interventional radiology, nor gastroenterology, no general surgery are able to place a PEG tube. We'll continue tube feeds via NG tube. We'll recheck CBC, electrolytes. Replace as needed.    Lines, Tubes, etc: ETT 01/02 >> . Patient self extubated, subsequently reintubated 09/25/2015 L IJ CVL 01/02 >>   Microbiology: Blood 12/30 >>  MRSA PCR 01/02 >> NEG Urine 01/04 >> NEG Blood 01/04 >> no growth   PCT 01/04: 0.74  Antibiotics:  Levofloxacin 12/30 >> 01/01 Ceftriaxone 01/02 >> 01/02 Azithro 01/02 >> 01/02 Levofloxacin 01/04 >> 01/05 Vanc 01/05 >> 1/9 Ceftaz 01/05 >>1/9   PCT 01/04: 0.74, 0.95, 0.77  Studies/Events: 12/31 TTE:  poor quality study, LVEF 45% 12/31 RUQ: Nodular contour the liver raising the question of cirrhosis. Gallbladder wall thickening, nonspecific in appearance 01/04 night - fever, hypotension. Cultures obtained. Abx expanded -failed self extubation  Consultants:  Renal 01/05: no acute indication for HD as this time   Best Practice: DVT: enoxaparin SUP: enteral famotidine Nutrition: TFs Glycemic control: N/I Sedation/analgesia:  fent/dexmedetomidine infusion. PRN lorazepam   Subj: Patient is minimally responsive today    Obj: Filed Vitals:   10/06/15 0500 10/06/15 0600 10/06/15 0700 10/06/15 0836  BP:   Pulse: 93 94 90   Temp:   97 F (36.1 C)   TempSrc:   Axillary   Resp:      Height:      Weight: 636 lb 12.8 oz (288.851 kg)     SpO2: 93% 94% 94% 95%    Gen: morbidly obese HEENT:  WNL Neck: CVL site clean, JVP cannot be assessed Chest: clear today somewhat prolonged exp time Cardiac: distant HS, regular, no M noted Abd: obese, soft, + BS Ext: symmetric edema, chronic stasis changes Neuro: GCS<10T  BMET    Component Value Date/Time   NA 141 10/06/2015 0500   K 4.8 10/06/2015 0500   CL 105 10/06/2015 0500   CO2 28 10/06/2015 0500   GLUCOSE 131* 10/06/2015 0500   BUN 113* 10/06/2015 0500   CREATININE 3.27* 10/06/2015 0500   CALCIUM 8.9 10/06/2015 0500   GFRNONAA 22* 10/06/2015 0500   GFRAA 25* 10/06/2015 0500    CBC    Component Value Date/Time   WBC 9.4 10/06/2015 0500   RBC 4.36* 10/06/2015 0500   HGB 10.9* 10/06/2015 0500   HCT 37.0* 10/06/2015 0500   PLT 130* 10/06/2015 0500   MCV 85.0 10/06/2015 0500   MCH 25.0* 10/06/2015 0500   MCHC 29.4* 10/06/2015 0500   RDW 21.4* 10/06/2015 0500   LYMPHSABS 2.1 09/24/2015 0512   MONOABS 1.5* 09/24/2015 0512   EOSABS 0.3 09/24/2015 0512   BASOSABS 0.0 09/24/2015 0512    CXR:  New linear atelectasis in the right lung apex. Persistent bibasilar atelectasis  and effusions        -Wells Guiles, M.D.   Critical Care Attestation.  I have personally obtained a history, examined the patient, evaluated laboratory and imaging results, formulated the assessment and plan and placed orders. The Patient requires high complexity decision making for assessment and support, frequent evaluation and titration of therapies, application of advanced monitoring technologies and extensive interpretation of multiple databases.  The patient has critical illness that could lead imminently to failure of 1 or more organ systems and requires the highest level of physician preparedness to intervene.  Critical Care Time devoted to patient care services described in this note is 35 minutes and is exclusive of time spent in procedures.

## 2015-10-06 NOTE — Clinical Social Work Note (Signed)
CSW has reviewed patient's chart for length of stay and discharge planning. CSW has spoken with patient's nurse and read chart and patient is not able to receive a PEG on the [facility] due to patient weight and equipment limitations. CSW has spoken to Dr. Nemiah Commander about consideration for transfer to a tertiary facility that can accommodate patient's weight. York Spaniel MSW,LCSW [phone]

## 2015-10-06 NOTE — Progress Notes (Signed)
PHARMACY - CRITICAL CARE PROGRESS NOTE  Pharmacy Consult for CRRT Dosing of Medications  No Known Allergies  Patient Measurements: Height:  (175.3 cm) Weight: (!) 636 lb 12.8 oz (288.851 kg) IBW/kg (Calculated) : 70.7   Vital Signs: Temp: 98.3 F (36.8 C) (01/18 1500) Temp Source: Axillary (01/18 1500) BP: 95/67 mmHg (01/18 1800) Pulse Rate: 93 (01/18 1800) Intake/Output from previous day: 01/17 0701 - 01/18 0700 In: 2720.1 [I.V.:1404.1; NG/GT:616; IV Piggyback:700] Out: 90 [Urine:90] Intake/Output from this shift:   Vent settings for last 24 hours: Vent Mode:  [-] PRVC FiO2 (%):  [60 %-70 %] 60 % Set Rate:  [25 bmp] 25 bmp Vt Set:  [500 mL] 500 mL PEEP:  [8 cmH20] 8 cmH20 Plateau Pressure:  [22 cmH20] 22 cmH20  Labs:  Recent Labs  10/16/2015 0607 10/05/15 0532 10/06/15 0500  WBC 10.3 10.7* 9.4  HGB 11.4* 11.6* 10.9*  HCT 38.5* 38.7* 37.0*  PLT 137* 137* 130*  CREATININE 2.03* 2.83* 3.27*  ALBUMIN  --   --  2.9*  PROT  --   --  7.0  AST  --   --  41  ALT  --   --  13*  ALKPHOS  --   --  74  BILITOT  --   --  3.6*   Estimated Creatinine Clearance: 66.4 mL/min (by C-G formula based on Cr of 3.27).   Recent Labs  10/06/15 0720 10/06/15 1112 10/06/15 1544  GLUCAP 109* 129* 123*     Medications:  Scheduled:  . albumin human  25 g Intravenous Q12H  . amiodarone  400 mg Oral BID  . antiseptic oral rinse  7 mL Mouth Rinse 10 times per day  . budesonide  0.25 mg Nebulization 4 times per day  . chlorhexidine gluconate  15 mL Mouth Rinse BID  . docusate  100 mg Oral BID  . escitalopram  5 mg Oral Daily  . famotidine  20 mg Per Tube BID  . feeding supplement (PRO-STAT SUGAR FREE 64)  30 mL Per Tube TID  . feeding supplement (VITAL HIGH PROTEIN)  1,000 mL Per Tube Q24H  . free water  30 mL Per Tube 6 times per day  . heparin subcutaneous  5,000 Units Subcutaneous 3 times per day  . Influenza vac split quadrivalent PF  0.5 mL Intramuscular  Tomorrow-1000  . ipratropium-albuterol  3 mL Nebulization Q6H  . metoprolol  5 mg Intravenous 4 times per day  . nystatin   Topical BID  . polyethylene glycol  17 g Oral Daily  . sennosides  5 mL Oral BID   Infusions:  . fentaNYL infusion INTRAVENOUS 100 mcg/hr (10/06/15 1342)  . furosemide (LASIX) infusion 5 mg/hr (10/06/15 0700)  . pureflow      Assessment: 40 y/o M admitted with PNA nad new-onset CHF with plans to begin CRRT due to ARF once temporary dialysis catheter placed.   Plan:  CRRT should be beginning within the next few hours.  At this time, no medication adjustments necessary. Pharmacy will continue to monitor daily.   Cindi Carbon, PharmD Clinical Pharmacist 10/06/2015,7:40 PM

## 2015-10-06 NOTE — Progress Notes (Addendum)
PHARMACY - CRITICAL CARE PROGRESS NOTE  Pharmacy Consult for Constipation Prevention.  Indication: ICU Status   No Known Allergies  Patient Measurements: Height:  (175.3 cm) Weight: (!) 636 lb 12.8 oz (288.851 kg) IBW/kg (Calculated) : 70.7   Vital Signs: Temp: 97 F (36.1 C) (01/18 0700) Temp Source: Axillary (01/18 0700) BP: 94/62 mmHg (01/18 0700) Pulse Rate: 90 (01/18 0700) Intake/Output from previous day: 01/17 0701 - 01/18 0700 In: 2638.4 [I.V.:1372.4; NG/GT:566; IV Piggyback:700] Out: 90 [Urine:90] Intake/Output from this shift: Total I/O In: 30 [NG/GT:30] Out: -  Vent settings for last 24 hours: Vent Mode:  [-] PRVC FiO2 (%):  [60 %-70 %] 60 % Set Rate:  [25 bmp] 25 bmp Vt Set:  [500 mL] 500 mL PEEP:  [8 cmH20] 8 cmH20 Plateau Pressure:  [22 cmH20] 22 cmH20  Labs:  Recent Labs  10/09/2015 0607 10/05/15 0532 10/06/15 0500  WBC 10.3 10.7* 9.4  HGB 11.4* 11.6* 10.9*  HCT 38.5* 38.7* 37.0*  PLT 137* 137* 130*  CREATININE 2.03* 2.83* 3.27*  ALBUMIN  --   --  2.9*  PROT  --   --  7.0  AST  --   --  41  ALT  --   --  13*  ALKPHOS  --   --  74  BILITOT  --   --  3.6*   Estimated Creatinine Clearance: 66.4 mL/min (by C-G formula based on Cr of 3.27).   Recent Labs  10/06/15 0359 10/06/15 0720 10/06/15 1112  GLUCAP 135* 109* 129*    Microbiology:   Medications:  Scheduled:  . albumin human  25 g Intravenous Q12H  . amiodarone  400 mg Oral BID  . antiseptic oral rinse  7 mL Mouth Rinse 10 times per day  . budesonide  0.25 mg Nebulization 4 times per day  . chlorhexidine gluconate  15 mL Mouth Rinse BID  . docusate  100 mg Oral BID  . escitalopram  5 mg Oral Daily  . famotidine  20 mg Per Tube BID  . feeding supplement (PRO-STAT SUGAR FREE 64)  30 mL Per Tube TID  . feeding supplement (VITAL HIGH PROTEIN)  1,000 mL Per Tube Q24H  . free water  30 mL Per Tube 6 times per day  . heparin subcutaneous  5,000 Units Subcutaneous 3 times per  day  . Influenza vac split quadrivalent PF  0.5 mL Intramuscular Tomorrow-1000  . ipratropium-albuterol  3 mL Nebulization Q6H  . metoprolol  5 mg Intravenous 4 times per day  . nystatin   Topical BID  . sennosides  5 mL Oral BID   Infusions:  . fentaNYL infusion INTRAVENOUS 50 mcg/hr (10/06/15 0900)  . furosemide (LASIX) infusion 5 mg/hr (10/05/15 1449)    Assessment: Pharmacy consulted for constipation management for 40 yo male ICU patient requiring mechanical ventilation.  Patient is currently on senna liquid 5 ml bid.   Plan:  Patient had multiple BM 1/12 but none since. Will add docusate 100 mg bid. MD wanted to add bisacodyl but cannot administer via tube. Will f/u on second rounds to see what other laxative may be used in place of bisacodyl or if bisacodyl suppository could be used. Based on afternoon rounds discussion, will begin Miralax daily in addition to senna and docusate. Will f/u AM.  Luisa Hart D 10/06/2015,11:34 AM

## 2015-10-06 NOTE — Progress Notes (Signed)
Dr.Shneir verified line placement to start CRRT... Pt noted to be awake and follows commands family visiting at bedside.

## 2015-10-06 NOTE — Progress Notes (Signed)
eLink Physician-Brief Progress Note Patient Name: Mitchell Morrow DOB: January 08, 1976 MRN: [id number]   Date of Service  10/06/2015  HPI/Events of Note  Request for transfer  eICU Interventions  D/w IR @ [facility] - their bed can hold upto 500 lbs No reason to transfer to [facility] unless surgical service willing to perform G-tube - again this can be held off until pt stable for dc to LTAC     Intervention Category Major Interventions: Other:  ALVA,RAKESH V. 10/06/2015, 6:20 PM

## 2015-10-07 DIAGNOSIS — J810 Acute pulmonary edema: Secondary | ICD-10-CM

## 2015-10-07 LAB — RENAL FUNCTION PANEL
ALBUMIN: 3.1 g/dL — AB (ref 3.5–5.0)
ALBUMIN: 2.9 g/dL — AB (ref 3.5–5.0)
ANION GAP: 8 (ref 5–15)
Albumin: 3 g/dL — ABNORMAL LOW (ref 3.5–5.0)
Albumin: 3.1 g/dL — ABNORMAL LOW (ref 3.5–5.0)
Anion gap: 8 (ref 5–15)
Anion gap: 22 — ABNORMAL HIGH (ref 5–15)
Anion gap: 10 (ref 5–15)
BUN: 105 mg/dL — AB (ref 6–20)
BUN: 110 mg/dL — ABNORMAL HIGH (ref 6–20)
BUN: 110 mg/dL — ABNORMAL HIGH (ref 6–20)
BUN: 127 mg/dL — AB (ref 6–20)
CALCIUM: 8.7 mg/dL — AB (ref 8.9–10.3)
CALCIUM: 8.8 mg/dL — AB (ref 8.9–10.3)
CALCIUM: 9.1 mg/dL (ref 8.9–10.3)
CHLORIDE: 104 mmol/L (ref 101–111)
CO2: 30 mmol/L (ref 22–32)
CO2: 16 mmol/L — ABNORMAL LOW (ref 22–32)
CO2: 27 mmol/L (ref 22–32)
CO2: 28 mmol/L (ref 22–32)
CREATININE: 3.49 mg/dL — AB (ref 0.61–1.24)
CREATININE: 3.62 mg/dL — AB (ref 0.61–1.24)
Calcium: 9 mg/dL (ref 8.9–10.3)
Chloride: 103 mmol/L (ref 101–111)
Chloride: 107 mmol/L (ref 101–111)
Chloride: 105 mmol/L (ref 101–111)
Creatinine, Ser: 3.81 mg/dL — ABNORMAL HIGH (ref 0.61–1.24)
Creatinine, Ser: 3.4 mg/dL — ABNORMAL HIGH (ref 0.61–1.24)
GFR calc Af Amer: 21 mL/min — ABNORMAL LOW (ref >60)
GFR calc Af Amer: 24 mL/min — ABNORMAL LOW (ref >60)
GFR calc non Af Amer: 18 mL/min — ABNORMAL LOW (ref >60)
GFR calc non Af Amer: 21 mL/min — ABNORMAL LOW (ref >60)
GFR, EST AFRICAN AMERICAN: 22 mL/min — AB (ref >60)
GFR, EST AFRICAN AMERICAN: 23 mL/min — AB (ref >60)
GFR, EST NON AFRICAN AMERICAN: 19 mL/min — AB (ref >60)
GFR, EST NON AFRICAN AMERICAN: 20 mL/min — AB (ref >60)
GLUCOSE: 126 mg/dL — AB (ref 65–99)
GLUCOSE: 136 mg/dL — AB (ref 65–99)
Glucose, Bld: 135 mg/dL — ABNORMAL HIGH (ref 65–99)
Glucose, Bld: 136 mg/dL — ABNORMAL HIGH (ref 65–99)
PHOSPHORUS: 5.1 mg/dL — AB (ref 2.5–4.6)
PHOSPHORUS: 6.6 mg/dL — AB (ref 2.5–4.6)
PHOSPHORUS: 6.8 mg/dL — AB (ref 2.5–4.6)
Phosphorus: 5.8 mg/dL — ABNORMAL HIGH (ref 2.5–4.6)
Potassium: 4.6 mmol/L (ref 3.5–5.1)
Potassium: 5.4 mmol/L — ABNORMAL HIGH (ref 3.5–5.1)
Potassium: 4.8 mmol/L (ref 3.5–5.1)
Potassium: 4.7 mmol/L (ref 3.5–5.1)
SODIUM: 141 mmol/L (ref 135–145)
SODIUM: 141 mmol/L (ref 135–145)
SODIUM: 145 mmol/L (ref 135–145)
Sodium: 141 mmol/L (ref 135–145)

## 2015-10-07 LAB — CBC WITH DIFFERENTIAL/PLATELET
BASOS ABS: 0.1 10*3/uL (ref 0–0.1)
EOS ABS: 0.5 10*3/uL (ref 0–0.7)
Eosinophils Relative: 6 %
HCT: 35.5 % — ABNORMAL LOW (ref 40.0–52.0)
HEMOGLOBIN: 10.5 g/dL — AB (ref 13.0–18.0)
Lymphocytes Relative: 9 %
Lymphs Abs: 0.8 10*3/uL — ABNORMAL LOW (ref 1.0–3.6)
MCH: 25.9 pg — ABNORMAL LOW (ref 26.0–34.0)
MCHC: 29.5 g/dL — ABNORMAL LOW (ref 32.0–36.0)
MCV: 87.6 fL (ref 80.0–100.0)
Monocytes Absolute: 1.4 10*3/uL — ABNORMAL HIGH (ref 0.2–1.0)
Monocytes Relative: 16 %
Neutro Abs: 6.1 10*3/uL (ref 1.4–6.5)
Platelets: 83 10*3/uL — ABNORMAL LOW (ref 150–440)
RBC: 4.06 MIL/uL — AB (ref 4.40–5.90)
RDW: 22.7 % — ABNORMAL HIGH (ref 11.5–14.5)
WBC: 9 10*3/uL (ref 3.8–10.6)

## 2015-10-07 LAB — GLUCOSE, CAPILLARY
GLUCOSE-CAPILLARY: 121 mg/dL — AB (ref 65–99)
GLUCOSE-CAPILLARY: 147 mg/dL — AB (ref 65–99)
GLUCOSE-CAPILLARY: 134 mg/dL — AB (ref 65–99)
GLUCOSE-CAPILLARY: 121 mg/dL — AB (ref 65–99)
Glucose-Capillary: 136 mg/dL — ABNORMAL HIGH (ref 65–99)
Glucose-Capillary: 140 mg/dL — ABNORMAL HIGH (ref 65–99)

## 2015-10-07 LAB — MAGNESIUM
MAGNESIUM: 2.5 mg/dL — AB (ref 1.7–2.4)
MAGNESIUM: 2.6 mg/dL — AB (ref 1.7–2.4)
Magnesium: 2.6 mg/dL — ABNORMAL HIGH (ref 1.7–2.4)
Magnesium: 2.7 mg/dL — ABNORMAL HIGH (ref 1.7–2.4)
Magnesium: 2.6 mg/dL — ABNORMAL HIGH (ref 1.7–2.4)
Magnesium: 2.5 mg/dL — ABNORMAL HIGH (ref 1.7–2.4)

## 2015-10-07 LAB — APTT: aPTT: 32 seconds (ref 24–36)

## 2015-10-07 MED ORDER — STERILE WATER FOR INJECTION IJ SOLN
INTRAMUSCULAR | Status: AC
  Administered 2015-10-07: 11:00:00
  Filled 2015-10-07: qty 10

## 2015-10-07 MED ORDER — NOREPINEPHRINE BITARTRATE 1 MG/ML IV SOLN
0.0000 ug/min | INTRAVENOUS | Status: DC
  Administered 2015-10-07: 3 ug/min via INTRAVENOUS
  Administered 2015-10-07: 5 ug/min via INTRAVENOUS
  Administered 2015-10-08: 9 ug/min via INTRAVENOUS
  Administered 2015-10-11 – 2015-10-12 (×2): 2 ug/min via INTRAVENOUS
  Administered 2015-10-13: 8 ug/min via INTRAVENOUS
  Administered 2015-10-14: 13 ug/min via INTRAVENOUS
  Administered 2015-10-15: 17 ug/min via INTRAVENOUS
  Administered 2015-10-16: 16 ug/min via INTRAVENOUS
  Administered 2015-10-17: 25 ug/min via INTRAVENOUS
  Administered 2015-10-18: 35 ug/min via INTRAVENOUS
  Administered 2015-10-18: 60 ug/min via INTRAVENOUS
  Filled 2015-10-07 (×13): qty 16

## 2015-10-07 MED ORDER — STERILE WATER FOR INJECTION IJ SOLN
INTRAMUSCULAR | Status: AC
  Administered 2015-10-07: 03:00:00
  Filled 2015-10-07: qty 10

## 2015-10-07 MED ORDER — ALTEPLASE 2 MG IJ SOLR
2.0000 mg | INTRAMUSCULAR | Status: AC
  Administered 2015-10-07: 2 mg
  Filled 2015-10-07: qty 2

## 2015-10-07 MED ORDER — FAMOTIDINE 20 MG PO TABS
20.0000 mg | ORAL_TABLET | ORAL | Status: DC
  Administered 2015-10-09 – 2015-10-11 (×2): 20 mg
  Filled 2015-10-07 (×2): qty 1

## 2015-10-07 MED ORDER — POLYETHYLENE GLYCOL 3350 17 G PO PACK: 17.0000 g | PACK | Freq: Every day | ORAL | Status: DC | PRN

## 2015-10-07 MED ORDER — ALTEPLASE 2 MG IJ SOLR
2.0000 mg | Freq: Once | INTRAMUSCULAR | Status: AC
  Administered 2015-10-07: 2 mg
  Filled 2015-10-07: qty 2

## 2015-10-07 NOTE — Progress Notes (Signed)
PHARMACY - CRITICAL CARE PROGRESS NOTE  Pharmacy Consult for Constipation Prevention.  Indication: ICU Status   No Known Allergies  Patient Measurements: Height:  (175.3 cm) Weight: (!) 643 lb 1.3 oz (291.7 kg) IBW/kg (Calculated) : 70.7   Vital Signs: Temp: 97.9 F (36.6 C) (01/19 1145) Temp Source: Axillary (01/19 1145) BP: 87/56 mmHg (01/19 1130) Pulse Rate: 109 (01/19 1130) Intake/Output from previous day: 01/18 0701 - 01/19 0700 In: 1942.5 [I.V.:355.5; ZO/XW:9604; IV Piggyback:100] Out: 291 [Urine:70] Intake/Output from this shift: Total I/O In: 486.6 [I.V.:56.6; NG/GT:330; IV Piggyback:100] Out: 139 [Other:139] Vent settings for last 24 hours: Vent Mode:  [-] PRVC FiO2 (%):  [50 %-60 %] 50 % Set Rate:  [25 bmp] 25 bmp Vt Set:  [500 mL] 500 mL PEEP:  [8 cmH20] 8 cmH20 Plateau Pressure:  [29 cmH20-32 cmH20] 32 cmH20  Labs:  Recent Labs  10/05/15 0532  10/06/15 0500 10/06/15 1852 10/07/15 0040 10/07/15 0439  WBC 10.7*  --  9.4  --   --   --   HGB 11.6*  --  10.9*  --   --   --   HCT 38.7*  --  37.0*  --   --   --   PLT 137*  --  130*  --   --   --   APTT  --   --   --   --   --  32  CREATININE 2.83*  --  3.27* 3.77* 3.49* 3.81*  MG  --   --   --  2.9* 2.6* 2.7*  PHOS  --   --   --  6.9* 6.6* 6.8*  ALBUMIN  --   < > 2.9* 3.0* 3.0* 3.1*  PROT  --   --  7.0  --   --   --   AST  --   --  41  --   --   --   ALT  --   --  13*  --   --   --   ALKPHOS  --   --  74  --   --   --   BILITOT  --   --  3.6*  --   --   --   < > = values in this interval not displayed. Estimated Creatinine Clearance: 57.4 mL/min (by C-G formula based on Cr of 3.81).   Recent Labs  10/07/15 0411 10/07/15 0801 10/07/15 1125  GLUCAP 121* 140* 147*    Microbiology:   Medications:  Scheduled:  . albumin human  25 g Intravenous Q12H  . amiodarone  400 mg Oral BID  . antiseptic oral rinse  7 mL Mouth Rinse 10 times per day  . budesonide  0.25 mg Nebulization 4 times per  day  . chlorhexidine gluconate  15 mL Mouth Rinse BID  . docusate  100 mg Oral BID  . escitalopram  5 mg Oral Daily  . [START ON 10/09/2015] famotidine  20 mg Per Tube QODAY  . feeding supplement (PRO-STAT SUGAR FREE 64)  30 mL Per Tube TID  . feeding supplement (VITAL HIGH PROTEIN)  1,000 mL Per Tube Q24H  . free water  30 mL Per Tube 6 times per day  . heparin subcutaneous  5,000 Units Subcutaneous 3 times per day  . Influenza vac split quadrivalent PF  0.5 mL Intramuscular Tomorrow-1000  . ipratropium-albuterol  3 mL Nebulization Q6H  . metoprolol  5 mg Intravenous 4 times per day  .  nystatin   Topical BID  . sennosides  5 mL Oral BID   Infusions:  . fentaNYL infusion INTRAVENOUS 125 mcg/hr (10/07/15 0600)  . norepinephrine (LEVOPHED) Adult infusion 4 mcg/min (10/07/15 1105)  . pureflow 2,500 mL (10/07/15 4098)    Assessment: Pharmacy consulted for constipation management for 40 yo male ICU patient requiring mechanical ventilation.     Plan:  Patient had a BM this morning so will continue senna and docusate. Will change Miralax to daily prn and continue to follow.    Luisa Hart D 10/07/2015,12:10 PM

## 2015-10-07 NOTE — Progress Notes (Signed)
Pt noted to have high venous pressures clot noted to filter . cartridge changed treatment restarted blood flow decreased due high venous pressures. 2.5/200Bf/100Uf. Will cont to monitor.

## 2015-10-07 NOTE — Progress Notes (Signed)
Chaplain rounded in the unit and offered a compassionate presence and support to the patient / family.  Said prayer with mother. Chaplain Clifton James [phone]

## 2015-10-07 NOTE — Progress Notes (Signed)
Nutrition Follow-up    INTERVENTION:   EN: recommend continuing TF as tolerated by pt   NUTRITION DIAGNOSIS:   Inadequate oral intake related to inability to eat as evidenced by NPO status.  GOAL:   Patient will meet greater than or equal to 90% of their needs  MONITOR:    (Energy Intake, Digestive System, Pulmonary Profile, Electrolyte and Renal Profile, Anthropometrics)  REASON FOR ASSESSMENT:   Ventilator, Consult Enteral/tube feeding initiation and management  ASSESSMENT:   Pt remains on vent, on CRRT, started on levophed  EN: tolerating Vital High Protein at rate of 60 ml/hr via NG  Skin:  Reviewed, no issues  Last BM:  1/19   Electrolyte and Renal Profile:  Recent Labs Lab 10/06/15 1852 10/07/15 0040 10/07/15 0439 10/07/15 1116  BUN 118* 110* 127*  --   CREATININE 3.77* 3.49* 3.81*  --   NA 140 141 145  --   K 4.8 4.6 5.4*  --   MG 2.9* 2.6* 2.7* 2.5*  PHOS 6.9* 6.6* 6.8*  --    Electrolyte and Renal Profile:  Recent Labs Lab 10/06/15 1852 10/07/15 0040 10/07/15 0439 10/07/15 1116  BUN 118* 110* 127*  --   CREATININE 3.77* 3.49* 3.81*  --   NA 140 141 145  --   K 4.8 4.6 5.4*  --   MG 2.9* 2.6* 2.7* 2.5*  PHOS 6.9* 6.6* 6.8*  --    Glucose Profile:  Recent Labs  10/07/15 0411 10/07/15 0801 10/07/15 1125  GLUCAP 121* 140* 147*   Meds: reviewed  Height:   Ht Readings from Last 1 Encounters:  09/20/15  (1.753 m)    Weight:   Wt Readings from Last 1 Encounters:  10/07/15 643 lb 1.3 oz (291.7 kg)    Filed Weights   10/05/15 0500 10/06/15 0500 10/07/15 0749  Weight: 634 lb 8 oz (287.807 kg) 636 lb 12.8 oz (288.851 kg) 643 lb 1.3 oz (291.7 kg)    BMI:  Body mass index is 94.92 kg/(m^2).  Estimated Nutritional Needs:   Kcal:  1600-1817kcals (22-25kcals/kg) using  IBW of 72.7kg  Protein:  145-181g protein (2.0-2.5g/kg) using IBW of 72.7kg  Fluid:  2181-2540mL of fluid (30-32mL/kg)  EDUCATION NEEDS:   No  education needs identified at this time   HIGH Care Level  Romelle Starcher MS, RD, LDN [phone] Pager  [phone] Weekend/On-Call Pager

## 2015-10-07 NOTE — Progress Notes (Signed)
[facility] - [employer] at [facility]   PATIENT NAME: Mitchell Morrow    MR#:  [id number]  DATE OF BIRTH:  June 01, 1976  SUBJECTIVE:   - Tracheostomy done on 10/03/2015. Patient is alert, not following some simple commands today. - NG tube in place and tube feeds started.  - dialysis catheter placed and started on CRRT from last evening, remains oliguric - BP low- started on levophed  REVIEW OF SYSTEMS:   Review of Systems  Unable to perform ROS: critical illness    DRUG ALLERGIES:  No Known Allergies  VITALS:  Blood pressure 101/59, pulse 109, temperature 97.9 F (36.6 C), temperature source Axillary, resp. rate 27, height  (1.753 m), weight 291.7 kg (643 lb 1.3 oz), SpO2 94 %.  PHYSICAL EXAMINATION:   Physical Exam   GENERAL: 40 y.o.-year-old morbidly obese, patient lying in the bed with no respiratory distress. EYES: Pupils equal, round, reactive to light and accommodation. No scleral icterus. Extraocular muscles intact.  HEENT: Head atraumatic, normocephalic. Oropharynx and nasopharynx clear. No oropharyngeal erythema, moist oral mucosa  NECK: Supple, short and thick. Tracheostomy in place. no jugular venous distention. No thyroid enlargement, no tenderness.  LUNGS: Poor air entry on both sides with scattered bilateral wheezing. Decreased bibasilar breath sounds. CARDIOVASCULAR: S1, S2 normal. No murmurs, rubs, or gallops.  ABDOMEN: Soft, nontender, nondistended. Bowel sounds present. No organomegaly or mass.  EXTREMITIES: No cyanosis, or clubbing. bilateral lower extremity lymphedema NEUROLOGIC: alert and following some simple commands and going back to sleep. PSYCHIATRIC: The patient is alert but not oriented. Tracheostomy in place. Nonverbal..  SKIN: Dry skin. Erythema in inframammary and groin folds.   LABORATORY PANEL:   CBC  Recent Labs Lab 10/06/15 0500  WBC 9.4  HGB 10.9*  HCT 37.0*  PLT 130*    ------------------------------------------------------------------------------------------------------------------  Chemistries   Recent Labs Lab 10/06/15 0500  10/07/15 0439 10/07/15 1116  NA 141  < > 145  --   K 4.8  < > 5.4*  --   CL 105  < > 107  --   CO2 28  < > 16*  --   GLUCOSE 131*  < > 126*  --   BUN 113*  < > 127*  --   CREATININE 3.27*  < > 3.81*  --   CALCIUM 8.9  < > 8.7*  --   MG  --   < > 2.7* 2.5*  AST 41  --   --   --   ALT 13*  --   --   --   ALKPHOS 74  --   --   --   BILITOT 3.6*  --   --   --   < > = values in this interval not displayed. ------------------------------------------------------------------------------------------------------------------  Cardiac Enzymes No results for input(s): TROPONINI in the last 168 hours. ------------------------------------------------------------------------------------------------------------------  RADIOLOGY:  Dg Abd 1 View  10/05/2015  CLINICAL DATA:  NG tube placement. EXAM: ABDOMEN - 1 VIEW COMPARISON:  09/25/2015 FINDINGS: Severely limited study by body habitus. Very difficult to visualize the NG tube. There appears to be a tube coiling within the stomach. IMPRESSION: Very limited study. There appears to be in NG tube coiling in the stomach. Electronically Signed   By: Charlett Nose M.D.   On: 10/05/2015 20:59   Dg Chest Port 1 View  10/06/2015  CLINICAL DATA:  Central line placement EXAM: PORTABLE CHEST 1 VIEW COMPARISON:  10/06/2015 FINDINGS: Cardiomegaly again noted. Persistent central vascular congestion  and mild interstitial edema bilaterally. Tracheostomy tube is unchanged in position. Stable left IJ central line position. There is new right IJ central line with tip in SVC right atrium junction. No pneumothorax. IMPRESSION: Persistent mild congestion/ edema. Cardiomegaly again noted. Stable left IJ central line position. There is new right IJ central line with tip in SVC right atrium junction. No  pneumothorax. Electronically Signed   By: Natasha Mead M.D.   On: 10/06/2015 19:08   Dg Chest Port 1 View  10/06/2015  CLINICAL DATA:  CHF.  Pneumonia. EXAM: PORTABLE CHEST 1 VIEW COMPARISON:  10/05/2015 FINDINGS: Tracheostomy in good position. NG tube not well visualized due to underpenetration. Cardiac enlargement. Congestive heart failure with bilateral edema has progressed in the interval. Progression of bibasilar atelectasis with hypoventilation noted. IMPRESSION: Progression of congestive heart failure with edema. Progression of bibasilar atelectasis. Electronically Signed   By: Marlan Palau M.D.   On: 10/06/2015 08:07    ASSESSMENT AND PLAN:   40 year old male with a history of morbid obesity and tobacco abuse who presented with lower extremity edema and found to have bilateral pneumonia.  1. Acute hypoxic hypercapnic respiratory failure-: This is multifactorial  due to obesity hypoventilation syndrome, community-acquired pneumonia and pulmonary edema versus ARDS.  -Intubated and on ventilator. Tracheostomy done on 09/28/2015. -Appreciate pulmonary consult. -Continues to be hypoxic, FiO2 at 50%. PEEP 8 -off antibiotics for his pneumonia. -On neb treatments.  2. Bilateral community-acquired pneumonia with septic shock -was on vancomycin and ceftazidime and he had completed course of treatment.   - restarted levophed due to hypotension once CRRT was started   3.New onset systolic congestive heart failure,  EF 45%  - had elevated BNP, edema and vascular congestion on chest x-ray  On dialysis now  4. Acute renal failure - Due to ATN and diuresis -Appreciate nephrology consult -didn't improve on Lasix drip with no improvement.  - remains oliguric, started CRRT from 10/06/15 PM  5. Nutrition-Currently has an NG tube with tube feeds. Will need a PEG tube. - due to his large body habitus,GI cannot do it endoscopically, discussed with IR team-patient will be too heavy for a CT table and  also the fluoroscopic table due to his weight. So cannot be done by interventional radiology.  -Patient will need to have an open procedure for his PEG tube placement by surgical team, however will be a high risk procedure considering his body habitus and also his other multiple medical problems. - will need to be done at bariatric center or teritiary care center- not accepted at [facility] [place] [facility] at [facility] - cont NG tube at this time- changing nares every 2 weeks to avoid sinusitis  6. Depression- low dose lexapro started  7.Hyperbilirubinemia could be passive congestion from congestive heart failure. hepatitis panel and ultrasound of right upper quadrant suggestive of cirrhotic changes. US showed Cirrhosis  8. Atrial fibrillation, -known history of paroxysmal atrial fibrillation. Now in A. fib with RVR. -on oral amidarone -Appreciate cardiology consult. -Risk of long-term full dose anticoagulation risks outweighs benefit at this time as per cardiac.   CODE STATUS: full.  TOTAL CRITICAL CARE TIME SPENT IN TAKING CARE OF THIS PATIENT: 42 critical care minutes.  He remains critically ill and high risk for cardiopulmonary arrest.   Updated Mother at bedside.   Enid Baas M.D on 10/07/2015   Between 7am to 6pm - Pager - [phone]  After 6pm go to [email or web address] - password [email or web address] [facility]  Office  [phone]  CC: Primary care physician; No PCP Per Patient  Note: This dictation was prepared with Dragon dictation along with smaller phrase technology. Any transcriptional errors that result from this process are unintentional.

## 2015-10-07 NOTE — Progress Notes (Signed)
MEDICATION RELATED CONSULT NOTE - INITIAL   Pharmacy Consult for CRRT dosing  No Known Allergies  Patient Measurements: Height:  (175.3 cm) Weight: (!) 643 lb 1.3 oz (291.7 kg) IBW/kg (Calculated) : 70.7  Vital Signs: Temp: 97.9 F (36.6 C) (01/19 1145) Temp Source: Axillary (01/19 1145) BP: 87/56 mmHg (01/19 1130) Pulse Rate: 109 (01/19 1130) Intake/Output from previous day: 01/18 0701 - 01/19 0700 In: 1942.5 [I.V.:355.5; ZO/XW:9604; IV Piggyback:100] Out: 291 [Urine:70] Intake/Output from this shift: Total I/O In: 486.6 [I.V.:56.6; NG/GT:330; IV Piggyback:100] Out: 139 [Other:139]  Labs:  Recent Labs  10/05/15 0532  10/06/15 0500 10/06/15 1852 10/07/15 0040 10/07/15 0439  WBC 10.7*  --  9.4  --   --   --   HGB 11.6*  --  10.9*  --   --   --   HCT 38.7*  --  37.0*  --   --   --   PLT 137*  --  130*  --   --   --   APTT  --   --   --   --   --  32  CREATININE 2.83*  --  3.27* 3.77* 3.49* 3.81*  MG  --   --   --  2.9* 2.6* 2.7*  PHOS  --   --   --  6.9* 6.6* 6.8*  ALBUMIN  --   < > 2.9* 3.0* 3.0* 3.1*  PROT  --   --  7.0  --   --   --   AST  --   --  41  --   --   --   ALT  --   --  13*  --   --   --   ALKPHOS  --   --  74  --   --   --   BILITOT  --   --  3.6*  --   --   --   < > = values in this interval not displayed. Estimated Creatinine Clearance: 57.4 mL/min (by C-G formula based on Cr of 3.81).   Microbiology: Recent Results (from the past 720 hour(s))  Blood culture (routine x 2)     Status: None   Collection Time: 09/18/15 11:53 PM  Result Value Ref Range Status   Specimen Description BLOOD LEFT HAND  Final   Special Requests BOTTLES DRAWN AEROBIC AND ANAEROBIC 4CC  Final   Culture NO GROWTH 5 DAYS  Final   Report Status 09/23/2015 FINAL  Final  Blood culture (routine x 2)     Status: None   Collection Time: 09/18/15 11:53 PM  Result Value Ref Range Status   Specimen Description BLOOD LEFT ARM  Final   Special Requests BOTTLES DRAWN  AEROBIC AND ANAEROBIC 5CC  Final   Culture NO GROWTH 5 DAYS  Final   Report Status 09/23/2015 FINAL  Final  Urine culture     Status: None   Collection Time: 09/18/15 12:21 AM  Result Value Ref Range Status   Specimen Description URINE, RANDOM  Final   Special Requests NONE  Final   Culture MULTIPLE SPECIES PRESENT, SUGGEST RECOLLECTION  Final   Report Status 09/19/2015 FINAL  Final  Rapid Influenza A&B Antigens ([facility] only)     Status: None   Collection Time: 09/18/15  6:26 AM  Result Value Ref Range Status   Influenza A ([facility]) NOT DETECTED  Final   Influenza B ([facility]) NOT DETECTED  Final  MRSA PCR Screening  Status: None   Collection Time: 09/20/15  6:30 AM  Result Value Ref Range Status   MRSA by PCR NEGATIVE NEGATIVE Final    Comment:        The GeneXpert MRSA Assay (FDA approved for NASAL specimens only), is one component of a comprehensive MRSA colonization surveillance program. It is not intended to diagnose MRSA infection nor to guide or monitor treatment for MRSA infections.   Culture, respiratory (NON-Expectorated)     Status: None   Collection Time: 09/22/15  3:13 PM  Result Value Ref Range Status   Specimen Description TRACHEAL ASPIRATE  Final   Special Requests NONE  Final   Gram Stain   Final    GOOD SPECIMEN - 80-90% WBCS MODERATE WBC SEEN RARE YEAST RARE GRAM NEGATIVE COCCOBACILLI    Culture LIGHT GROWTH CANDIDA TROPICALIS  Final   Report Status 09/26/2015 FINAL  Final  Urine culture     Status: None   Collection Time: 09/22/15 11:16 PM  Result Value Ref Range Status   Specimen Description URINE, RANDOM  Final   Special Requests NONE  Final   Culture NO GROWTH 2 DAYS  Final   Report Status 09/24/2015 FINAL  Final  CULTURE, BLOOD (ROUTINE X 2) w Reflex to PCR ID Panel     Status: None   Collection Time: 09/22/15 11:44 PM  Result Value Ref Range Status   Specimen Description BLOOD RIGHT ANTECUBITAL  Final   Special Requests BOTTLES DRAWN AEROBIC AND  ANAEROBIC  Final   Culture NO GROWTH 5 DAYS  Final   Report Status 09/28/2015 FINAL  Final  CULTURE, BLOOD (ROUTINE X 2) w Reflex to PCR ID Panel     Status: None   Collection Time: 09/22/15 11:56 PM  Result Value Ref Range Status   Specimen Description BLOOD LEFT HAND  Final   Special Requests BOTTLES DRAWN AEROBIC AND ANAEROBIC  Final   Culture NO GROWTH 5 DAYS  Final   Report Status 09/28/2015 FINAL  Final  Culture, respiratory (NON-Expectorated)     Status: None   Collection Time: 09/23/15 12:01 AM  Result Value Ref Range Status   Specimen Description TRACHEAL ASPIRATE  Final   Special Requests NONE  Final   Gram Stain   Final    GOOD SPECIMEN - 80-90% WBCS MODERATE WBC SEEN RARE YEAST RARE GRAM POSITIVE COCCI    Culture Consistent with normal respiratory flora.  Final   Report Status 09/25/2015 FINAL  Final  Urine culture     Status: None   Collection Time: 10/01/15 10:35 AM  Result Value Ref Range Status   Specimen Description URINE, RANDOM  Final   Special Requests NONE  Final   Culture NO GROWTH 2 DAYS  Final   Report Status 10/03/2015 FINAL  Final  CULTURE, BLOOD (ROUTINE X 2) w Reflex to PCR ID Panel     Status: None   Collection Time: 10/01/15 10:56 AM  Result Value Ref Range Status   Specimen Description BLOOD RIGHT ARM  Final   Special Requests   Final    BOTTLES DRAWN AEROBIC AND ANAEROBIC  AER 3CC ANA 5CC   Culture NO GROWTH 5 DAYS  Final   Report Status 10/06/2015 FINAL  Final  CULTURE, BLOOD (ROUTINE X 2) w Reflex to PCR ID Panel     Status: None   Collection Time: 10/01/15 11:14 AM  Result Value Ref Range Status   Specimen Description BLOOD LEFT HAND  Final  Special Requests   Final    BOTTLES DRAWN AEROBIC AND ANAEROBIC  AER 3CC ANA 4CC   Culture  Setup Time   Final    GRAM POSITIVE COCCI ANAEROBIC BOTTLE ONLY CRITICAL RESULT CALLED TO, READ BACK BY AND VERIFIED WITH: CRYSTAL SCARPENA AT 0915 ON 10/02/15 CTJ    Culture   Final    COAGULASE  NEGATIVE STAPHYLOCOCCUS ANAEROBIC BOTTLE ONLY Results consistent with contamination.    Report Status 10/06/2015 FINAL  Final  Blood Culture ID Panel (Reflexed)     Status: Abnormal   Collection Time: 10/01/15 11:14 AM  Result Value Ref Range Status   Enterococcus species NOT DETECTED NOT DETECTED Final   Listeria monocytogenes NOT DETECTED NOT DETECTED Final   Staphylococcus species DETECTED (A) NOT DETECTED Final   Staphylococcus aureus NOT DETECTED NOT DETECTED Final   Streptococcus species NOT DETECTED NOT DETECTED Final   Streptococcus agalactiae NOT DETECTED NOT DETECTED Final   Streptococcus pneumoniae NOT DETECTED NOT DETECTED Final   Streptococcus pyogenes NOT DETECTED NOT DETECTED Final   Acinetobacter baumannii NOT DETECTED NOT DETECTED Final   Enterobacteriaceae species NOT DETECTED NOT DETECTED Final   Enterobacter cloacae complex NOT DETECTED NOT DETECTED Final   Escherichia coli NOT DETECTED NOT DETECTED Final   Klebsiella oxytoca NOT DETECTED NOT DETECTED Final   Klebsiella pneumoniae NOT DETECTED NOT DETECTED Final   Proteus species NOT DETECTED NOT DETECTED Final   Serratia marcescens NOT DETECTED NOT DETECTED Final   Haemophilus influenzae NOT DETECTED NOT DETECTED Final   Neisseria meningitidis NOT DETECTED NOT DETECTED Final   Pseudomonas aeruginosa NOT DETECTED NOT DETECTED Final   Candida albicans NOT DETECTED NOT DETECTED Final   Candida glabrata NOT DETECTED NOT DETECTED Final   Candida krusei NOT DETECTED NOT DETECTED Final   Candida parapsilosis NOT DETECTED NOT DETECTED Final   Candida tropicalis NOT DETECTED NOT DETECTED Final   Carbapenem resistance NOT DETECTED NOT DETECTED Final   Methicillin resistance DETECTED (A) NOT DETECTED Final    Comment: CRITICAL RESULT CALLED TO, READ BACK BY AND VERIFIED WITH: CRYSTAL SCARPENA FOR STAPHYLOCOCCUS SPECIES AND METHICILLIN RESISTANCE AT 0915 ON 10/02/15 CTJ    Vancomycin resistance NOT DETECTED NOT DETECTED  Final  Culture, expectorated sputum-assessment     Status: None   Collection Time: 10/01/15 11:51 AM  Result Value Ref Range Status   Specimen Description EXPECTORATED SPUTUM  Final   Special Requests Normal  Final   Sputum evaluation THIS SPECIMEN IS ACCEPTABLE FOR SPUTUM CULTURE  Final   Report Status 10/01/2015 FINAL  Final  Culture, respiratory (NON-Expectorated)     Status: None   Collection Time: 10/01/15 11:51 AM  Result Value Ref Range Status   Specimen Description EXPECTORATED SPUTUM  Final   Special Requests Normal Reflexed from [id number]  Final   Gram Stain   Final    GOOD SPECIMEN - 80-90% WBCS MODERATE WBC SEEN FEW YEAST    Culture   Final    LIGHT GROWTH CANDIDA ALBICANS LIGHT GROWTH CANDIDA DUBLINIENSIS    Report Status 10/06/2015 FINAL  Final    Medical History: Past Medical History  Diagnosis Date  . Super obesity (HCC)   . Tobacco abuse   . Chronic systolic CHF (congestive heart failure) (HCC)   . Atrial fibrillation (HCC) 09/2015    Medications:  Scheduled:  . albumin human  25 g Intravenous Q12H  . amiodarone  400 mg Oral BID  . antiseptic oral rinse  7 mL Mouth Rinse 10  times per day  . budesonide  0.25 mg Nebulization 4 times per day  . chlorhexidine gluconate  15 mL Mouth Rinse BID  . docusate  100 mg Oral BID  . escitalopram  5 mg Oral Daily  . [START ON 10/09/2015] famotidine  20 mg Per Tube QODAY  . feeding supplement (PRO-STAT SUGAR FREE 64)  30 mL Per Tube TID  . feeding supplement (VITAL HIGH PROTEIN)  1,000 mL Per Tube Q24H  . free water  30 mL Per Tube 6 times per day  . heparin subcutaneous  5,000 Units Subcutaneous 3 times per day  . Influenza vac split quadrivalent PF  0.5 mL Intramuscular Tomorrow-1000  . ipratropium-albuterol  3 mL Nebulization Q6H  . metoprolol  5 mg Intravenous 4 times per day  . nystatin   Topical BID  . sennosides  5 mL Oral BID   Infusions:  . fentaNYL infusion INTRAVENOUS 125 mcg/hr (10/07/15 0600)  .  norepinephrine (LEVOPHED) Adult infusion 4 mcg/min (10/07/15 1105)  . pureflow 2,500 mL (10/07/15 6962)    Assessment: Pharmacy consulted to renally adjust medications in this 40 y/o M with respiratory failure, CHF, and ARF to begin CRRT.   Plan:  Will change famotidine to 20 mg per tube q 48 hours. No other medications need adjustment at present. Pharmacy will continue to monitor and adjust as needed.   Luisa Hart D 10/07/2015,12:08 PM

## 2015-10-07 NOTE — Progress Notes (Signed)
Dr. Cherylann Ratel and Dr. Nicholos Johns present and discussing patient's care.  Dr. Nicholos Johns gave order for levophed drip.

## 2015-10-07 NOTE — Progress Notes (Addendum)
Pt started to have a rise in access pressures .filter noted to be clotted lines remain patent at present flushing well.  Blue line sluggish w/o blood return. Will discontinue current treatment and restart.

## 2015-10-07 NOTE — Progress Notes (Signed)
PULMONARY/CCM PROGRESS NOTE  40 year old massively obese male with acute CHF, volume overload. Now with acute renal failure, vent dependent respiratory failure status post trach.  IMPRESSION: --Acute on chronic hypercarbic respiratory failure , suspect status post ARDS, as well as  pulmonary edema. -- Obesity hypoventilation syndrome --Atelectasis --now with oliguric renal failure. The patient is seen by nephrology, he's been placed on a Lasix drip with some suboptimal response, was started on CRRT 1/18. Acute systolic CHF with volume overload.  --Depression; RN notes that pt appears tearful.   PLAN/RECS:  Cont vent support - FiO2 requirements are reduced today. We will continue to wean down as tolerated. Chest x-ray from 1/19 reviewed, minimally changed from yesterday. Nephro following, currently on CRRT  We'll continue tube feeds via NG tube. We'll recheck CBC, electrolytes. Replace as needed. Started SSRI.   Lines, Tubes, etc: ETT 01/02 >> . Patient self extubated, subsequently reintubated 09/25/2015 L IJ CVL 01/02 >>   Microbiology: Blood 12/30 >>  MRSA PCR 01/02 >> NEG Urine 01/04 >> NEG Blood 01/04 >> no growth   PCT 01/04: 0.74  Antibiotics:  Levofloxacin 12/30 >> 01/01 Ceftriaxone 01/02 >> 01/02 Azithro 01/02 >> 01/02 Levofloxacin 01/04 >> 01/05 Vanc 01/05 >> 1/9 Ceftaz 01/05 >>1/9   PCT 01/04: 0.74, 0.95, 0.77  Studies/Events: 12/31 TTE:  poor quality study, LVEF 45% 12/31 RUQ: Nodular contour the liver raising the question of cirrhosis. Gallbladder wall thickening, nonspecific in appearance 01/04 night - fever, hypotension. Cultures obtained. Abx expanded -failed self extubation   Best Practice: DVT: enoxaparin SUP: enteral famotidine Nutrition: TFs    Subj: Patient arousable, does not follow commands.     Obj: Filed Vitals:   10/07/15 1100 10/07/15 1115 10/07/15 1130 10/07/15 1145  BP:   Pulse: 110 109 109   Temp:     97.9 F (36.6 C)  TempSrc:    Axillary  Resp:      Height:      Weight:      SpO2: 96% 95% 95%     Gen: morbidly obese HEENT:  WNL Neck: CVL site clean, JVP cannot be assessed Chest: clear today somewhat prolonged exp time Cardiac: distant HS, regular, no M noted Abd: obese, soft, + BS Ext: symmetric edema, chronic stasis changes Neuro: GCS<10T  BMET    Component Value Date/Time   NA 145 10/07/2015 0439   K 5.4* 10/07/2015 0439   CL 107 10/07/2015 0439   CO2 16* 10/07/2015 0439   GLUCOSE 126* 10/07/2015 0439   BUN 127* 10/07/2015 0439   CREATININE 3.81* 10/07/2015 0439   CALCIUM 8.7* 10/07/2015 0439   GFRNONAA 18* 10/07/2015 0439   GFRAA 21* 10/07/2015 0439    CBC    Component Value Date/Time   WBC 9.4 10/06/2015 0500   RBC 4.36* 10/06/2015 0500   HGB 10.9* 10/06/2015 0500   HCT 37.0* 10/06/2015 0500   PLT 130* 10/06/2015 0500   MCV 85.0 10/06/2015 0500   MCH 25.0* 10/06/2015 0500   MCHC 29.4* 10/06/2015 0500   RDW 21.4* 10/06/2015 0500   LYMPHSABS 2.1 09/24/2015 0512   MONOABS 1.5* 09/24/2015 0512   EOSABS 0.3 09/24/2015 0512   BASOSABS 0.0 09/24/2015 0512    CXR:  New linear atelectasis in the right lung apex. Persistent bibasilar atelectasis and effusions        -Deep Nicholos Johns, M.D.   Critical Care Attestation.  I have personally obtained a history, examined the patient, evaluated laboratory and imaging results, formulated the  assessment and plan and placed orders. The Patient requires high complexity decision making for assessment and support, frequent evaluation and titration of therapies, application of advanced monitoring technologies and extensive interpretation of multiple databases. The patient has critical illness that could lead imminently to failure of 1 or more organ systems and requires the highest level of physician preparedness to intervene.  Critical Care Time devoted to patient care services described in this note is 35 minutes and  is exclusive of time spent in procedures.

## 2015-10-07 NOTE — Progress Notes (Signed)
Pt blue line in causing high venous pressures . heparin used in line to help flow which was ineffective. Orders placed for cath flo to port and will dwell as ordered

## 2015-10-07 NOTE — Progress Notes (Signed)
[facility]  Daily Progress Note   Subjective  - 3 Days Post-Op  Patient remains intubated and minimally responsive currently he is on CVVH. The nurse reports the flow is adequate but just barely   Objective Filed Vitals:   10/07/15 1830 10/07/15 1845 10/07/15 1900 10/07/15 1924  BP: 102/64 89/57 95/63    Pulse: 113 110 95 96  Temp:    99 F (37.2 C)  TempSrc:    Oral  Resp:      Height:      Weight:      SpO2: 95% 92% 87% 95%    Intake/Output Summary (Last 24 hours) at 10/07/15 1946 Last data filed at 10/07/15 1904  Gross per 24 hour  Intake 2195.69 ml  Output    703 ml  Net 1492.69 ml    PULM  Normal effort , no use of accessory muscles CV  No JVD, RRR Abd      No distended, nontender VASC  right IJ temporary catheter is intact no bleeding at the site no hematoma  Laboratory CBC    Component Value Date/Time   WBC 9.0 10/07/2015 1116   HGB 10.5* 10/07/2015 1116   HCT 35.5* 10/07/2015 1116   PLT 83* 10/07/2015 1116    BMET    Component Value Date/Time   NA 141 10/07/2015 1824   K 4.7 10/07/2015 1824   CL 105 10/07/2015 1824   CO2 28 10/07/2015 1824   GLUCOSE 136* 10/07/2015 1824   BUN 105* 10/07/2015 1824   CREATININE 3.40* 10/07/2015 1824   CALCIUM 8.8* 10/07/2015 1824   GFRNONAA 21* 10/07/2015 1824   GFRAA 24* 10/07/2015 1824    Assessment/Planning:  POD #1 s/p insertion Of a catheter for CVVH    Given the overall circumstances we will continue to work with the existing right IJ catheter femoral catheters are not really an option. Should he improve overall arrangements will need to be made at an outside institution for placement of a tunneled catheter    Renford Dills  10/07/2015, 7:46 PM

## 2015-10-07 NOTE — Progress Notes (Signed)
Dialysis treatment stopped due to cartridge being clotted.  Patient's vascath venous access also had clot when RN unhooked patient from dialysis machine, therefore cathflo ordered per Dr. Laurey Morale order as discussed this morning. RN will instill cathflo.

## 2015-10-07 NOTE — Progress Notes (Signed)
[facility]  ROUNDING NOTE   Subjective:  Patient started on continuous renal replacement therapy. Oliguria persists. FiO2 S at 50% this a.m. Mother at bedside and updated on patient condition   Objective:  Vital signs in last 24 hours:  Temp:  [97.8 F (36.6 C)-98.7 F (37.1 C)] 98.3 F (36.8 C) (01/19 0735) Pulse Rate:  [88-110] 107 (01/19 0830) Resp:  [29] 29 (01/19 0800) BP: (83-112)/(54-77) 84/64 mmHg (01/19 0830) SpO2:  [91 %-100 %] 95 % (01/19 0830) FiO2 (%):  [60 %] 60 % (01/19 0824) Weight:  [291.7 kg (643 lb 1.3 oz)] 291.7 kg (643 lb 1.3 oz) (01/19 0749)  Weight change:  Filed Weights   10/05/15 0500 10/06/15 0500 10/07/15 0749  Weight: 287.807 kg (634 lb 8 oz) 288.851 kg (636 lb 12.8 oz) 291.7 kg (643 lb 1.3 oz)    Intake/Output: I/O last 3 completed shifts: In: 2938.9 [I.V.:765.9; Other:20; NG/GT:1953; IV Piggyback:200] Out: 356 [Urine:135; Other:221]   Intake/Output this shift:  Total I/O In: 102.5 [I.V.:12.5; NG/GT:90] Out: 44 [Other:44]  Physical Exam: General: Critically ill  Head: [place]/AT difficult to assess hearing  Eyes: Mild icterus noted  Neck: Tracheostomy in place  Lungs:  Bilateral rhonchi, vent assisted, fio2 50%  Heart: S1S2 no rubs  Abdomen:  Soft, nontender, obese  Extremities: 3+ generalized edema.  Neurologic: Awake but not following commands  Skin: No lesions  GU: foley    Basic Metabolic Panel:  Recent Labs Lab 10/13/2015 0607 10/05/15 0532 10/06/15 0500 10/06/15 1852 10/07/15 0040 10/07/15 0439  NA 143 140 141 140 141  --   K 4.4 4.8 4.8 4.8 4.6  --   CL 104 102 105 102 103  --   CO2 31 28 28 28 30   --   GLUCOSE 119* 121* 131* 119* 135*  --   BUN 99* 95* 113* 118* 110*  --   CREATININE 2.03* 2.83* 3.27* 3.77* 3.49*  --   CALCIUM 8.5* 8.6* 8.9 9.0 9.1  --   MG  --   --   --  2.9* 2.6* 2.7*  PHOS  --   --   --  6.9* 6.6*  --     Liver Function Tests:  Recent Labs Lab 10/06/15 0500 10/06/15 1852  10/07/15 0040  AST 41  --   --   ALT 13*  --   --   ALKPHOS 74  --   --   BILITOT 3.6*  --   --   PROT 7.0  --   --   ALBUMIN 2.9* 3.0* 3.0*   No results for input(s): LIPASE, AMYLASE in the last 168 hours. No results for input(s): AMMONIA in the last 168 hours.  CBC:  Recent Labs Lab 10/01/15 0426 10/02/15 0305 09/25/2015 0607 10/05/15 0532 10/06/15 0500  WBC 11.5* 12.3* 10.3 10.7* 9.4  HGB 11.8* 11.3* 11.4* 11.6* 10.9*  HCT 38.7* 37.3* 38.5* 38.7* 37.0*  MCV 83.7 84.0 86.4 86.4 85.0  PLT 170 154 137* 137* 130*    Cardiac Enzymes: No results for input(s): CKTOTAL, CKMB, CKMBINDEX, TROPONINI in the last 168 hours.  BNP: Invalid input(s): POCBNP  CBG:  Recent Labs Lab 10/06/15 1544 10/06/15 1940 10/06/15 2355 10/07/15 0411 10/07/15 0801  GLUCAP 123* 121* 136* 121* 140*    Microbiology: Results for orders placed or performed during the hospital encounter of 02-Oct-2015  Blood culture (routine x 2)     Status: None   Collection Time: 10-02-2015 11:53 PM  Result Value  Ref Range Status   Specimen Description BLOOD LEFT HAND  Final   Special Requests BOTTLES DRAWN AEROBIC AND ANAEROBIC 4CC  Final   Culture NO GROWTH 5 DAYS  Final   Report Status 09/23/2015 FINAL  Final  Blood culture (routine x 2)     Status: None   Collection Time: 08/29/2015 11:53 PM  Result Value Ref Range Status   Specimen Description BLOOD LEFT ARM  Final   Special Requests BOTTLES DRAWN AEROBIC AND ANAEROBIC 5CC  Final   Culture NO GROWTH 5 DAYS  Final   Report Status 09/23/2015 FINAL  Final  Urine culture     Status: None   Collection Time: 09/18/15 12:21 AM  Result Value Ref Range Status   Specimen Description URINE, RANDOM  Final   Special Requests NONE  Final   Culture MULTIPLE SPECIES PRESENT, SUGGEST RECOLLECTION  Final   Report Status 09/19/2015 FINAL  Final  Rapid Influenza A&B Antigens ([facility] only)     Status: None   Collection Time: 09/18/15  6:26 AM  Result Value Ref Range  Status   Influenza A ([facility]) NOT DETECTED  Final   Influenza B ([facility]) NOT DETECTED  Final  MRSA PCR Screening     Status: None   Collection Time: 09/20/15  6:30 AM  Result Value Ref Range Status   MRSA by PCR NEGATIVE NEGATIVE Final    Comment:        The GeneXpert MRSA Assay (FDA approved for NASAL specimens only), is one component of a comprehensive MRSA colonization surveillance program. It is not intended to diagnose MRSA infection nor to guide or monitor treatment for MRSA infections.   Culture, respiratory (NON-Expectorated)     Status: None   Collection Time: 09/22/15  3:13 PM  Result Value Ref Range Status   Specimen Description TRACHEAL ASPIRATE  Final   Special Requests NONE  Final   Gram Stain   Final    GOOD SPECIMEN - 80-90% WBCS MODERATE WBC SEEN RARE YEAST RARE GRAM NEGATIVE COCCOBACILLI    Culture LIGHT GROWTH CANDIDA TROPICALIS  Final   Report Status 09/26/2015 FINAL  Final  Urine culture     Status: None   Collection Time: 09/22/15 11:16 PM  Result Value Ref Range Status   Specimen Description URINE, RANDOM  Final   Special Requests NONE  Final   Culture NO GROWTH 2 DAYS  Final   Report Status 09/24/2015 FINAL  Final  CULTURE, BLOOD (ROUTINE X 2) w Reflex to PCR ID Panel     Status: None   Collection Time: 09/22/15 11:44 PM  Result Value Ref Range Status   Specimen Description BLOOD RIGHT ANTECUBITAL  Final   Special Requests BOTTLES DRAWN AEROBIC AND ANAEROBIC  Final   Culture NO GROWTH 5 DAYS  Final   Report Status 09/28/2015 FINAL  Final  CULTURE, BLOOD (ROUTINE X 2) w Reflex to PCR ID Panel     Status: None   Collection Time: 09/22/15 11:56 PM  Result Value Ref Range Status   Specimen Description BLOOD LEFT HAND  Final   Special Requests BOTTLES DRAWN AEROBIC AND ANAEROBIC  Final   Culture NO GROWTH 5 DAYS  Final   Report Status 09/28/2015 FINAL  Final  Culture, respiratory (NON-Expectorated)     Status: None   Collection Time: 09/23/15  12:01 AM  Result Value Ref Range Status   Specimen Description TRACHEAL ASPIRATE  Final   Special Requests NONE  Final  Gram Stain   Final    GOOD SPECIMEN - 80-90% WBCS MODERATE WBC SEEN RARE YEAST RARE GRAM POSITIVE COCCI    Culture Consistent with normal respiratory flora.  Final   Report Status 09/25/2015 FINAL  Final  Urine culture     Status: None   Collection Time: 10/01/15 10:35 AM  Result Value Ref Range Status   Specimen Description URINE, RANDOM  Final   Special Requests NONE  Final   Culture NO GROWTH 2 DAYS  Final   Report Status 10/03/2015 FINAL  Final  CULTURE, BLOOD (ROUTINE X 2) w Reflex to PCR ID Panel     Status: None   Collection Time: 10/01/15 10:56 AM  Result Value Ref Range Status   Specimen Description BLOOD RIGHT ARM  Final   Special Requests   Final    BOTTLES DRAWN AEROBIC AND ANAEROBIC  AER 3CC ANA 5CC   Culture NO GROWTH 5 DAYS  Final   Report Status 10/06/2015 FINAL  Final  CULTURE, BLOOD (ROUTINE X 2) w Reflex to PCR ID Panel     Status: None   Collection Time: 10/01/15 11:14 AM  Result Value Ref Range Status   Specimen Description BLOOD LEFT HAND  Final   Special Requests   Final    BOTTLES DRAWN AEROBIC AND ANAEROBIC  AER 3CC ANA 4CC   Culture  Setup Time   Final    GRAM POSITIVE COCCI ANAEROBIC BOTTLE ONLY CRITICAL RESULT CALLED TO, READ BACK BY AND VERIFIED WITH: CRYSTAL SCARPENA AT 0915 ON 10/02/15 CTJ    Culture   Final    COAGULASE NEGATIVE STAPHYLOCOCCUS ANAEROBIC BOTTLE ONLY Results consistent with contamination.    Report Status 10/06/2015 FINAL  Final  Blood Culture ID Panel (Reflexed)     Status: Abnormal   Collection Time: 10/01/15 11:14 AM  Result Value Ref Range Status   Enterococcus species NOT DETECTED NOT DETECTED Final   Listeria monocytogenes NOT DETECTED NOT DETECTED Final   Staphylococcus species DETECTED (A) NOT DETECTED Final   Staphylococcus aureus NOT DETECTED NOT DETECTED Final   Streptococcus species NOT  DETECTED NOT DETECTED Final   Streptococcus agalactiae NOT DETECTED NOT DETECTED Final   Streptococcus pneumoniae NOT DETECTED NOT DETECTED Final   Streptococcus pyogenes NOT DETECTED NOT DETECTED Final   Acinetobacter baumannii NOT DETECTED NOT DETECTED Final   Enterobacteriaceae species NOT DETECTED NOT DETECTED Final   Enterobacter cloacae complex NOT DETECTED NOT DETECTED Final   Escherichia coli NOT DETECTED NOT DETECTED Final   Klebsiella oxytoca NOT DETECTED NOT DETECTED Final   Klebsiella pneumoniae NOT DETECTED NOT DETECTED Final   Proteus species NOT DETECTED NOT DETECTED Final   Serratia marcescens NOT DETECTED NOT DETECTED Final   Haemophilus influenzae NOT DETECTED NOT DETECTED Final   Neisseria meningitidis NOT DETECTED NOT DETECTED Final   Pseudomonas aeruginosa NOT DETECTED NOT DETECTED Final   Candida albicans NOT DETECTED NOT DETECTED Final   Candida glabrata NOT DETECTED NOT DETECTED Final   Candida krusei NOT DETECTED NOT DETECTED Final   Candida parapsilosis NOT DETECTED NOT DETECTED Final   Candida tropicalis NOT DETECTED NOT DETECTED Final   Carbapenem resistance NOT DETECTED NOT DETECTED Final   Methicillin resistance DETECTED (A) NOT DETECTED Final    Comment: CRITICAL RESULT CALLED TO, READ BACK BY AND VERIFIED WITH: CRYSTAL SCARPENA FOR STAPHYLOCOCCUS SPECIES AND METHICILLIN RESISTANCE AT 0915 ON 10/02/15 CTJ    Vancomycin resistance NOT DETECTED NOT DETECTED Final  Culture, expectorated sputum-assessment  Status: None   Collection Time: 10/01/15 11:51 AM  Result Value Ref Range Status   Specimen Description EXPECTORATED SPUTUM  Final   Special Requests Normal  Final   Sputum evaluation THIS SPECIMEN IS ACCEPTABLE FOR SPUTUM CULTURE  Final   Report Status 10/01/2015 FINAL  Final  Culture, respiratory (NON-Expectorated)     Status: None   Collection Time: 10/01/15 11:51 AM  Result Value Ref Range Status   Specimen Description EXPECTORATED SPUTUM  Final    Special Requests Normal Reflexed from [id number]  Final   Gram Stain   Final    GOOD SPECIMEN - 80-90% WBCS MODERATE WBC SEEN FEW YEAST    Culture   Final    LIGHT GROWTH CANDIDA ALBICANS LIGHT GROWTH CANDIDA DUBLINIENSIS    Report Status 10/06/2015 FINAL  Final    Coagulation Studies: No results for input(s): LABPROT, INR in the last 72 hours.  Urinalysis: No results for input(s): COLORURINE, LABSPEC, PHURINE, GLUCOSEU, HGBUR, BILIRUBINUR, KETONESUR, PROTEINUR, UROBILINOGEN, NITRITE, LEUKOCYTESUR in the last 72 hours.  Invalid input(s): APPERANCEUR    Imaging: Dg Abd 1 View  10/05/2015  CLINICAL DATA:  NG tube placement. EXAM: ABDOMEN - 1 VIEW COMPARISON:  09/25/2015 FINDINGS: Severely limited study by body habitus. Very difficult to visualize the NG tube. There appears to be a tube coiling within the stomach. IMPRESSION: Very limited study. There appears to be in NG tube coiling in the stomach. Electronically Signed   By: Charlett Nose M.D.   On: 10/05/2015 20:59   Dg Chest Port 1 View  10/06/2015  CLINICAL DATA:  Central line placement EXAM: PORTABLE CHEST 1 VIEW COMPARISON:  10/06/2015 FINDINGS: Cardiomegaly again noted. Persistent central vascular congestion and mild interstitial edema bilaterally. Tracheostomy tube is unchanged in position. Stable left IJ central line position. There is new right IJ central line with tip in SVC right atrium junction. No pneumothorax. IMPRESSION: Persistent mild congestion/ edema. Cardiomegaly again noted. Stable left IJ central line position. There is new right IJ central line with tip in SVC right atrium junction. No pneumothorax. Electronically Signed   By: Natasha Mead M.D.   On: 10/06/2015 19:08   Dg Chest Port 1 View  10/06/2015  CLINICAL DATA:  CHF.  Pneumonia. EXAM: PORTABLE CHEST 1 VIEW COMPARISON:  10/05/2015 FINDINGS: Tracheostomy in good position. NG tube not well visualized due to underpenetration. Cardiac enlargement. Congestive heart  failure with bilateral edema has progressed in the interval. Progression of bibasilar atelectasis with hypoventilation noted. IMPRESSION: Progression of congestive heart failure with edema. Progression of bibasilar atelectasis. Electronically Signed   By: Marlan Palau M.D.   On: 10/06/2015 08:07     Medications:   . fentaNYL infusion INTRAVENOUS 125 mcg/hr (10/07/15 0600)  . norepinephrine (LEVOPHED) Adult infusion    . pureflow 2,500 mL (10/07/15 1610)   . albumin human  25 g Intravenous Q12H  . amiodarone  400 mg Oral BID  . antiseptic oral rinse  7 mL Mouth Rinse 10 times per day  . budesonide  0.25 mg Nebulization 4 times per day  . chlorhexidine gluconate  15 mL Mouth Rinse BID  . docusate  100 mg Oral BID  . escitalopram  5 mg Oral Daily  . famotidine  20 mg Per Tube BID  . feeding supplement (PRO-STAT SUGAR FREE 64)  30 mL Per Tube TID  . feeding supplement (VITAL HIGH PROTEIN)  1,000 mL Per Tube Q24H  . free water  30 mL Per Tube 6 times per  day  . heparin subcutaneous  5,000 Units Subcutaneous 3 times per day  . Influenza vac split quadrivalent PF  0.5 mL Intramuscular Tomorrow-1000  . ipratropium-albuterol  3 mL Nebulization Q6H  . metoprolol  5 mg Intravenous 4 times per day  . nystatin   Topical BID  . polyethylene glycol  17 g Oral Daily  . sennosides  5 mL Oral BID   acetaminophen **OR** acetaminophen, albuterol, bisacodyl, calcium carbonate, fentaNYL (SUBLIMAZE) injection, heparin, ibuprofen, LORazepam, ondansetron **OR** ondansetron (ZOFRAN) IV, sodium chloride  Assessment/ Plan:  Mr. Mitchell Morrow is a 40 y.o. black male with morbid obesity, tobacco abuse, without prescribed home medications, who was admitted to [facility] on 09/01/2015, s/p tracheostomy placement, persistent respiratory failure, multiple episodes of ARF  1. Acute Renal Failure:  Second episode of ARF, reconsulted 10/05/15, hypotension contributing now.  -  Patient started on continuous renal  replacement therapy.  Tolerating well thus far. We will increase ultrafiltration rate to 100 cc per hour. I've discussed the care plan with critical care. We have agreed to add pressors to maintain a map of 65.   2. Acute respiratory failure with bilateral pneumonia and acute exacerbation of systolic congestive heart failure: As above we will increase ultrafiltration rate to see if we can further wean down his oxygen.   3.  Generalized edema:  Increase ultrafiltration rate to 100 cc per hour and add pressors.  4.  Hypotension: Add Levophed today to maintain a map of 65 or greater.   LOS: 20 Auryn Paige 1/19/20179:17 AM

## 2015-10-07 NOTE — Progress Notes (Signed)
Pt CRRT running since 530 . At 2.5 /200 bf/50 Uf  Due to increasing venous pressures . Pt cont on TF tolerating well. Pt had large BM this shift . Cont on fentanyl  which was increase due to facial grimacing and tears with repositioning. Family remained at bedside this shift. Metoprolol held due to soft pressures while receiving crrt. Further assessments via flowsheet

## 2015-10-07 NOTE — Progress Notes (Signed)
RN spoke with Lab tech on phone who made this RN aware at 1342 that renal function panel that was sent down at 1116 was not enough speciman after magnesium level was ran. This RN will redraw.

## 2015-10-08 LAB — RENAL FUNCTION PANEL
ALBUMIN: 3.1 g/dL — AB (ref 3.5–5.0)
ANION GAP: 10 (ref 5–15)
ANION GAP: 11 (ref 5–15)
Albumin: 3.1 g/dL — ABNORMAL LOW (ref 3.5–5.0)
Albumin: 3.1 g/dL — ABNORMAL LOW (ref 3.5–5.0)
Albumin: 3 g/dL — ABNORMAL LOW (ref 3.5–5.0)
Albumin: 3.1 g/dL — ABNORMAL LOW (ref 3.5–5.0)
Anion gap: 10 (ref 5–15)
Anion gap: 12 (ref 5–15)
Anion gap: 9 (ref 5–15)
BUN: 100 mg/dL — AB (ref 6–20)
BUN: 96 mg/dL — AB (ref 6–20)
BUN: 93 mg/dL — ABNORMAL HIGH (ref 6–20)
BUN: 88 mg/dL — ABNORMAL HIGH (ref 6–20)
BUN: 84 mg/dL — AB (ref 6–20)
CALCIUM: 8.7 mg/dL — AB (ref 8.9–10.3)
CALCIUM: 9.2 mg/dL (ref 8.9–10.3)
CHLORIDE: 104 mmol/L (ref 101–111)
CHLORIDE: 104 mmol/L (ref 101–111)
CHLORIDE: 104 mmol/L (ref 101–111)
CHLORIDE: 104 mmol/L (ref 101–111)
CO2: 28 mmol/L (ref 22–32)
CO2: 27 mmol/L (ref 22–32)
CO2: 27 mmol/L (ref 22–32)
CO2: 29 mmol/L (ref 22–32)
CO2: 28 mmol/L (ref 22–32)
CREATININE: 3.03 mg/dL — AB (ref 0.61–1.24)
Calcium: 9 mg/dL (ref 8.9–10.3)
Calcium: 9.2 mg/dL (ref 8.9–10.3)
Calcium: 9 mg/dL (ref 8.9–10.3)
Chloride: 103 mmol/L (ref 101–111)
Creatinine, Ser: 3.44 mg/dL — ABNORMAL HIGH (ref 0.61–1.24)
Creatinine, Ser: 3.15 mg/dL — ABNORMAL HIGH (ref 0.61–1.24)
Creatinine, Ser: 3.19 mg/dL — ABNORMAL HIGH (ref 0.61–1.24)
Creatinine, Ser: 2.94 mg/dL — ABNORMAL HIGH (ref 0.61–1.24)
GFR calc Af Amer: 24 mL/min — ABNORMAL LOW (ref >60)
GFR calc Af Amer: 27 mL/min — ABNORMAL LOW (ref >60)
GFR calc Af Amer: 29 mL/min — ABNORMAL LOW (ref >60)
GFR calc non Af Amer: 23 mL/min — ABNORMAL LOW (ref >60)
GFR calc non Af Amer: 24 mL/min — ABNORMAL LOW (ref >60)
GFR, EST AFRICAN AMERICAN: 26 mL/min — AB (ref >60)
GFR, EST AFRICAN AMERICAN: 28 mL/min — AB (ref >60)
GFR, EST NON AFRICAN AMERICAN: 21 mL/min — AB (ref >60)
GFR, EST NON AFRICAN AMERICAN: 23 mL/min — AB (ref >60)
GFR, EST NON AFRICAN AMERICAN: 25 mL/min — AB (ref >60)
GLUCOSE: 120 mg/dL — AB (ref 65–99)
GLUCOSE: 118 mg/dL — AB (ref 65–99)
GLUCOSE: 121 mg/dL — AB (ref 65–99)
Glucose, Bld: 125 mg/dL — ABNORMAL HIGH (ref 65–99)
Glucose, Bld: 113 mg/dL — ABNORMAL HIGH (ref 65–99)
PHOSPHORUS: 3.8 mg/dL (ref 2.5–4.6)
PHOSPHORUS: 3.6 mg/dL (ref 2.5–4.6)
POTASSIUM: 4.7 mmol/L (ref 3.5–5.1)
POTASSIUM: 4.7 mmol/L (ref 3.5–5.1)
POTASSIUM: 4.6 mmol/L (ref 3.5–5.1)
Phosphorus: 5 mg/dL — ABNORMAL HIGH (ref 2.5–4.6)
Phosphorus: 4.3 mg/dL (ref 2.5–4.6)
Phosphorus: 4.3 mg/dL (ref 2.5–4.6)
Potassium: 4.6 mmol/L (ref 3.5–5.1)
Potassium: 4.6 mmol/L (ref 3.5–5.1)
SODIUM: 143 mmol/L (ref 135–145)
Sodium: 142 mmol/L (ref 135–145)
Sodium: 143 mmol/L (ref 135–145)
Sodium: 141 mmol/L (ref 135–145)
Sodium: 141 mmol/L (ref 135–145)

## 2015-10-08 LAB — CBC
HCT: 34.5 % — ABNORMAL LOW (ref 40.0–52.0)
HEMOGLOBIN: 10.4 g/dL — AB (ref 13.0–18.0)
MCH: 26.1 pg (ref 26.0–34.0)
MCHC: 30.2 g/dL — ABNORMAL LOW (ref 32.0–36.0)
MCV: 86.4 fL (ref 80.0–100.0)
Platelets: 72 10*3/uL — ABNORMAL LOW (ref 150–440)
RBC: 3.99 MIL/uL — AB (ref 4.40–5.90)
RDW: 21.7 % — ABNORMAL HIGH (ref 11.5–14.5)
WBC: 9 10*3/uL (ref 3.8–10.6)

## 2015-10-08 LAB — GLUCOSE, CAPILLARY
GLUCOSE-CAPILLARY: 105 mg/dL — AB (ref 65–99)
GLUCOSE-CAPILLARY: 110 mg/dL — AB (ref 65–99)
GLUCOSE-CAPILLARY: 110 mg/dL — AB (ref 65–99)
GLUCOSE-CAPILLARY: 110 mg/dL — AB (ref 65–99)
GLUCOSE-CAPILLARY: 121 mg/dL — AB (ref 65–99)
Glucose-Capillary: 109 mg/dL — ABNORMAL HIGH (ref 65–99)
Glucose-Capillary: 139 mg/dL — ABNORMAL HIGH (ref 65–99)

## 2015-10-08 LAB — MAGNESIUM
MAGNESIUM: 2.5 mg/dL — AB (ref 1.7–2.4)
MAGNESIUM: 2.3 mg/dL (ref 1.7–2.4)
MAGNESIUM: 2.3 mg/dL (ref 1.7–2.4)
Magnesium: 2.4 mg/dL (ref 1.7–2.4)

## 2015-10-08 LAB — EXPECTORATED SPUTUM ASSESSMENT W GRAM STAIN, RFLX TO RESP C

## 2015-10-08 LAB — APTT: aPTT: 60 seconds — ABNORMAL HIGH (ref 24–36)

## 2015-10-08 MED ORDER — HEPARIN SODIUM (PORCINE) 5000 UNIT/ML IJ SOLN
5000.0000 [IU] | Freq: Three times a day (TID) | INTRAMUSCULAR | Status: DC
  Administered 2015-10-08 – 2015-10-10 (×7): 5000 [IU] via SUBCUTANEOUS
  Filled 2015-10-08 (×7): qty 1

## 2015-10-08 NOTE — Progress Notes (Signed)
Pt cartridge clotted and treatment discontinued. Lines flushed and patent. New cartridge inserted and treatment continued. Pt noted to be alert open eyes spontaneously. Follows command at times. Pt resting comfortably at present cont. on levo/fenatnyl

## 2015-10-08 NOTE — Progress Notes (Signed)
Chaplain rounded in the unit and offered a compassionate presence and support to the patient.  Said silent prayer. Chaplain Loel Betancur [phone] 

## 2015-10-08 NOTE — Progress Notes (Signed)
Upon removal of initial foley cathter, small stone noted on tip of catheter.  New foley catheter inserted with ease but no urine return upon insertion.  Patient was bladder scanned with greatest result being 282cc and other scans indicated ZERO ml, therefore inconsistent results.  Patient was difficult to bladder scan due to patient's body habitus.  RN irrigated foley with 40cc of sterile saline with only 35cc of cloudy returned.  RN notified Dr. Sung Amabile and Dr. Cherylann Ratel in person of all mentioned above and both doctors instructed RN to continue to monitor output. Both MD's saw stone.

## 2015-10-08 NOTE — Progress Notes (Signed)
Cartridge clotted on CRRT therefore therapy stopped.  Vascath packed with heparin per order. Dr. Cherylann Ratel aware. RN will restart CRRT with new cartridge.

## 2015-10-08 NOTE — Progress Notes (Signed)
MEDICATION RELATED CONSULT NOTE - INITIAL   Pharmacy Consult for CRRT dosing  No Known Allergies  Patient Measurements: Height:  (175.3 cm) Weight: (!) 645 lb 1.1 oz (292.6 kg) IBW/kg (Calculated) : 70.7  Vital Signs: Temp: 99.9 F (37.7 C) (01/20 0800) Temp Source: Oral (01/20 0800) BP: 98/56 mmHg (01/20 1130) Pulse Rate: 97 (01/20 1130) Intake/Output from previous day: 01/19 0701 - 01/20 0700 In: 2412.6 [I.V.:462.6; NG/GT:1680; IV Piggyback:200] Out: 1858 [Urine:140; Stool:1; Blood:191] Intake/Output from this shift: Total I/O In: 543.6 [I.V.:83.6; NG/GT:360; IV Piggyback:100] Out: 331 [Urine:10; Other:130; Blood:191]  Labs:  Recent Labs  10/06/15 0500  10/07/15 0439 10/07/15 1116  10/07/15 2321 10/08/15 0346 10/08/15 1002  WBC 9.4  --   --  9.0  --   --  9.0  --   HGB 10.9*  --   --  10.5*  --   --  10.4*  --   HCT 37.0*  --   --  35.5*  --   --  34.5*  --   PLT 130*  --   --  83*  --   --  72*  --   APTT  --   --  32  --   --   --  60*  --   CREATININE 3.27*  < > 3.81*  --   < > 3.44* 3.15* 3.19*  MG  --   < > 2.7* 2.6*  2.5*  < > 2.6* 2.4 2.5*  PHOS  --   < > 6.8*  --   < > 5.0* 4.3 4.3  ALBUMIN 2.9*  < > 3.1*  --   < > 3.1* 3.1* 3.0*  PROT 7.0  --   --   --   --   --   --   --   AST 41  --   --   --   --   --   --   --   ALT 13*  --   --   --   --   --   --   --   ALKPHOS 74  --   --   --   --   --   --   --   BILITOT 3.6*  --   --   --   --   --   --   --   < > = values in this interval not displayed. Estimated Creatinine Clearance: 68.8 mL/min (by C-G formula based on Cr of 3.19).   Microbiology: Recent Results (from the past 720 hour(s))  Blood culture (routine x 2)     Status: None   Collection Time: 09/19/2015 11:53 PM  Result Value Ref Range Status   Specimen Description BLOOD LEFT HAND  Final   Special Requests BOTTLES DRAWN AEROBIC AND ANAEROBIC 4CC  Final   Culture NO GROWTH 5 DAYS  Final   Report Status 09/23/2015 FINAL  Final  Blood  culture (routine x 2)     Status: None   Collection Time: 09/19/15 11:53 PM  Result Value Ref Range Status   Specimen Description BLOOD LEFT ARM  Final   Special Requests BOTTLES DRAWN AEROBIC AND ANAEROBIC 5CC  Final   Culture NO GROWTH 5 DAYS  Final   Report Status 09/23/2015 FINAL  Final  Urine culture     Status: None   Collection Time: 09/18/15 12:21 AM  Result Value Ref Range Status   Specimen Description URINE, RANDOM  Final  Special Requests NONE  Final   Culture MULTIPLE SPECIES PRESENT, SUGGEST RECOLLECTION  Final   Report Status 09/19/2015 FINAL  Final  Rapid Influenza A&B Antigens ([facility] only)     Status: None   Collection Time: 09/18/15  6:26 AM  Result Value Ref Range Status   Influenza A ([facility]) NOT DETECTED  Final   Influenza B ([facility]) NOT DETECTED  Final  MRSA PCR Screening     Status: None   Collection Time: 09/20/15  6:30 AM  Result Value Ref Range Status   MRSA by PCR NEGATIVE NEGATIVE Final    Comment:        The GeneXpert MRSA Assay (FDA approved for NASAL specimens only), is one component of a comprehensive MRSA colonization surveillance program. It is not intended to diagnose MRSA infection nor to guide or monitor treatment for MRSA infections.   Culture, respiratory (NON-Expectorated)     Status: None   Collection Time: 09/22/15  3:13 PM  Result Value Ref Range Status   Specimen Description TRACHEAL ASPIRATE  Final   Special Requests NONE  Final   Gram Stain   Final    GOOD SPECIMEN - 80-90% WBCS MODERATE WBC SEEN RARE YEAST RARE GRAM NEGATIVE COCCOBACILLI    Culture LIGHT GROWTH CANDIDA TROPICALIS  Final   Report Status 09/26/2015 FINAL  Final  Urine culture     Status: None   Collection Time: 09/22/15 11:16 PM  Result Value Ref Range Status   Specimen Description URINE, RANDOM  Final   Special Requests NONE  Final   Culture NO GROWTH 2 DAYS  Final   Report Status 09/24/2015 FINAL  Final  CULTURE, BLOOD (ROUTINE X 2) w Reflex to PCR ID  Panel     Status: None   Collection Time: 09/22/15 11:44 PM  Result Value Ref Range Status   Specimen Description BLOOD RIGHT ANTECUBITAL  Final   Special Requests BOTTLES DRAWN AEROBIC AND ANAEROBIC  Final   Culture NO GROWTH 5 DAYS  Final   Report Status 09/28/2015 FINAL  Final  CULTURE, BLOOD (ROUTINE X 2) w Reflex to PCR ID Panel     Status: None   Collection Time: 09/22/15 11:56 PM  Result Value Ref Range Status   Specimen Description BLOOD LEFT HAND  Final   Special Requests BOTTLES DRAWN AEROBIC AND ANAEROBIC  Final   Culture NO GROWTH 5 DAYS  Final   Report Status 09/28/2015 FINAL  Final  Culture, respiratory (NON-Expectorated)     Status: None   Collection Time: 09/23/15 12:01 AM  Result Value Ref Range Status   Specimen Description TRACHEAL ASPIRATE  Final   Special Requests NONE  Final   Gram Stain   Final    GOOD SPECIMEN - 80-90% WBCS MODERATE WBC SEEN RARE YEAST RARE GRAM POSITIVE COCCI    Culture Consistent with normal respiratory flora.  Final   Report Status 09/25/2015 FINAL  Final  Urine culture     Status: None   Collection Time: 10/01/15 10:35 AM  Result Value Ref Range Status   Specimen Description URINE, RANDOM  Final   Special Requests NONE  Final   Culture NO GROWTH 2 DAYS  Final   Report Status 10/03/2015 FINAL  Final  CULTURE, BLOOD (ROUTINE X 2) w Reflex to PCR ID Panel     Status: None   Collection Time: 10/01/15 10:56 AM  Result Value Ref Range Status   Specimen Description BLOOD RIGHT ARM  Final  Special Requests   Final    BOTTLES DRAWN AEROBIC AND ANAEROBIC  AER 3CC ANA 5CC   Culture NO GROWTH 5 DAYS  Final   Report Status 10/06/2015 FINAL  Final  CULTURE, BLOOD (ROUTINE X 2) w Reflex to PCR ID Panel     Status: None   Collection Time: 10/01/15 11:14 AM  Result Value Ref Range Status   Specimen Description BLOOD LEFT HAND  Final   Special Requests   Final    BOTTLES DRAWN AEROBIC AND ANAEROBIC  AER 3CC ANA 4CC   Culture  Setup  Time   Final    GRAM POSITIVE COCCI ANAEROBIC BOTTLE ONLY CRITICAL RESULT CALLED TO, READ BACK BY AND VERIFIED WITH: CRYSTAL SCARPENA AT 0915 ON 10/02/15 CTJ    Culture   Final    COAGULASE NEGATIVE STAPHYLOCOCCUS ANAEROBIC BOTTLE ONLY Results consistent with contamination.    Report Status 10/06/2015 FINAL  Final  Blood Culture ID Panel (Reflexed)     Status: Abnormal   Collection Time: 10/01/15 11:14 AM  Result Value Ref Range Status   Enterococcus species NOT DETECTED NOT DETECTED Final   Listeria monocytogenes NOT DETECTED NOT DETECTED Final   Staphylococcus species DETECTED (A) NOT DETECTED Final   Staphylococcus aureus NOT DETECTED NOT DETECTED Final   Streptococcus species NOT DETECTED NOT DETECTED Final   Streptococcus agalactiae NOT DETECTED NOT DETECTED Final   Streptococcus pneumoniae NOT DETECTED NOT DETECTED Final   Streptococcus pyogenes NOT DETECTED NOT DETECTED Final   Acinetobacter baumannii NOT DETECTED NOT DETECTED Final   Enterobacteriaceae species NOT DETECTED NOT DETECTED Final   Enterobacter cloacae complex NOT DETECTED NOT DETECTED Final   Escherichia coli NOT DETECTED NOT DETECTED Final   Klebsiella oxytoca NOT DETECTED NOT DETECTED Final   Klebsiella pneumoniae NOT DETECTED NOT DETECTED Final   Proteus species NOT DETECTED NOT DETECTED Final   Serratia marcescens NOT DETECTED NOT DETECTED Final   Haemophilus influenzae NOT DETECTED NOT DETECTED Final   Neisseria meningitidis NOT DETECTED NOT DETECTED Final   Pseudomonas aeruginosa NOT DETECTED NOT DETECTED Final   Candida albicans NOT DETECTED NOT DETECTED Final   Candida glabrata NOT DETECTED NOT DETECTED Final   Candida krusei NOT DETECTED NOT DETECTED Final   Candida parapsilosis NOT DETECTED NOT DETECTED Final   Candida tropicalis NOT DETECTED NOT DETECTED Final   Carbapenem resistance NOT DETECTED NOT DETECTED Final   Methicillin resistance DETECTED (A) NOT DETECTED Final    Comment: CRITICAL  RESULT CALLED TO, READ BACK BY AND VERIFIED WITH: CRYSTAL SCARPENA FOR STAPHYLOCOCCUS SPECIES AND METHICILLIN RESISTANCE AT 0915 ON 10/02/15 CTJ    Vancomycin resistance NOT DETECTED NOT DETECTED Final  Culture, expectorated sputum-assessment     Status: None   Collection Time: 10/01/15 11:51 AM  Result Value Ref Range Status   Specimen Description EXPECTORATED SPUTUM  Final   Special Requests Normal  Final   Sputum evaluation THIS SPECIMEN IS ACCEPTABLE FOR SPUTUM CULTURE  Final   Report Status 10/01/2015 FINAL  Final  Culture, respiratory (NON-Expectorated)     Status: None   Collection Time: 10/01/15 11:51 AM  Result Value Ref Range Status   Specimen Description EXPECTORATED SPUTUM  Final   Special Requests Normal Reflexed from [id number]  Final   Gram Stain   Final    GOOD SPECIMEN - 80-90% WBCS MODERATE WBC SEEN FEW YEAST    Culture   Final    LIGHT GROWTH CANDIDA ALBICANS LIGHT GROWTH CANDIDA DUBLINIENSIS    Report  Status 10/06/2015 FINAL  Final    Medical History: Past Medical History  Diagnosis Date  . Super obesity (HCC)   . Tobacco abuse   . Chronic systolic CHF (congestive heart failure) (HCC)   . Atrial fibrillation (HCC) 09/2015    Medications:  Scheduled:  . albumin human  25 g Intravenous Q12H  . amiodarone  400 mg Oral BID  . antiseptic oral rinse  7 mL Mouth Rinse 10 times per day  . budesonide  0.25 mg Nebulization 4 times per day  . chlorhexidine gluconate  15 mL Mouth Rinse BID  . docusate  100 mg Oral BID  . escitalopram  5 mg Oral Daily  . [START ON 10/09/2015] famotidine  20 mg Per Tube QODAY  . feeding supplement (PRO-STAT SUGAR FREE 64)  30 mL Per Tube TID  . feeding supplement (VITAL HIGH PROTEIN)  1,000 mL Per Tube Q24H  . free water  30 mL Per Tube 6 times per day  . heparin subcutaneous  5,000 Units Subcutaneous 3 times per day  . Influenza vac split quadrivalent PF  0.5 mL Intramuscular Tomorrow-1000  . ipratropium-albuterol  3 mL  Nebulization Q6H  . metoprolol  5 mg Intravenous 4 times per day  . nystatin   Topical BID  . sennosides  5 mL Oral BID   Infusions:  . fentaNYL infusion INTRAVENOUS 125 mcg/hr (10/08/15 0600)  . norepinephrine (LEVOPHED) Adult infusion 8.96 mcg/min (10/08/15 0600)  . pureflow 3 each (10/07/15 1735)    Assessment: Pharmacy consulted to renally adjust medications in this 40 y/o M with respiratory failure, CHF, and ARF to begin CRRT.   Plan:  No medications require adjustment at present. Pharmacy will continue to monitor and adjust as needed.   Luisa Hart D 10/08/2015,11:44 AM

## 2015-10-08 NOTE — Progress Notes (Signed)
[facility] -  at [facility]   PATIENT NAME: Mitchell Morrow    MR#:  [id number]  DATE OF BIRTH:  [birth year]  SUBJECTIVE:  Patient is not following commands. Patient is status post tracheostomy.  REVIEW OF SYSTEMS:   Review of Systems  Unable to perform ROS: critical illness    DRUG ALLERGIES:  No Known Allergies  VITALS:  Blood pressure 96/59, pulse 97, temperature 98.9 F (37.2 C), temperature source Oral, resp. rate 29, height  (1.753 m), weight 292.6 kg (645 lb 1.1 oz), SpO2 94 %.  PHYSICAL EXAMINATION:   Physical Exam   GENERAL: 40 y.o.-year-old morbidly obese, patient lying in the bed with no respiratory distress. EYES: Pupils equal, round, reactive to light and accommodation. No scleral icterus. Extraocular muscles intact.  HEENT: Head atraumatic, normocephalic. Oropharynx and nasopharynx clear. No oropharyngeal erythema, moist oral mucosa  NECK: Supple, short and thick. Tracheostomy in place. no jugular venous distention. No thyroid enlargement, no tenderness.  LUNGS: bilateral wheezing Decreased bibasilar breath sounds. CARDIOVASCULAR: S1, S2 normal. No murmurs, rubs, or gallops.  ABDOMEN: Soft, nontender, nondistended. Bowel sounds present. No organomegaly or mass.  EXTREMITIES: No cyanosis, or clubbing. bilateral lower extremity lymphedema NEUROLOGIC: alert and following some simple commands and going back to sleep. PSYCHIATRIC: The patient has eyes open but not following commands Tracheostomy in place.  SKIN: Dry skin. Erythema in inframammary and groin folds.   LABORATORY PANEL:   CBC  Recent Labs Lab 10/08/15 0346  WBC 9.0  HGB 10.4*  HCT 34.5*  PLT 72*   ------------------------------------------------------------------------------------------------------------------  Chemistries   Recent Labs Lab 10/06/15 0500  10/08/15 1002  NA 141  < > 141  K 4.8  < > 4.6  CL 105  < > 104  CO2 28  < > 27   GLUCOSE 131*  < > 113*  BUN 113*  < > 93*  CREATININE 3.27*  < > 3.19*  CALCIUM 8.9  < > 8.7*  MG  --   < > 2.5*  AST 41  --   --   ALT 13*  --   --   ALKPHOS 74  --   --   BILITOT 3.6*  --   --   < > = values in this interval not displayed. ------------------------------------------------------------------------------------------------------------------  Cardiac Enzymes No results for input(s): TROPONINI in the last 168 hours. ------------------------------------------------------------------------------------------------------------------  RADIOLOGY:  Dg Chest Port 1 View  10/06/2015  CLINICAL DATA:  Central line placement EXAM: PORTABLE CHEST 1 VIEW COMPARISON:  10/06/2015 FINDINGS: Cardiomegaly again noted. Persistent central vascular congestion and mild interstitial edema bilaterally. Tracheostomy tube is unchanged in position. Stable left IJ central line position. There is new right IJ central line with tip in SVC right atrium junction. No pneumothorax. IMPRESSION: Persistent mild congestion/ edema. Cardiomegaly again noted. Stable left IJ central line position. There is new right IJ central line with tip in SVC right atrium junction. No pneumothorax. Electronically Signed   By: Natasha Mead M.D.   On: 10/06/2015 19:08    ASSESSMENT AND PLAN:   40 year old male with a history of morbid obesity and tobacco abuse who presented with lower extremity edema and found to have bilateral pneumonia.  1. Acute hypoxic hypercapnic respiratory failure-: This is multifactorial  due to obesity hypoventilation syndrome, community-acquired pneumonia and pulmonary edema versus ARDS.  -Intubated and on ventilator. Tracheostomy done on 2015-10-14. Continue vent management as per pulmonary Patient has been treated for pneumonia.   2. Bilateral  community-acquired pneumonia with septic shock He was on vancomycin and ceftazidime and he had completed course of treatment.   He was restarted levophed due  to hypotension once CRRT was started   3.New onset systolic congestive heart failure,  EF 45%  - had elevated BNP, edema and vascular congestion on chest x-ray  On dialysis now  4. Acute renal failure - Due to ATN and diuresis Appreciate nephrology consult Due to persistent oliguria patient was started on  CRRT from 10/06/15.  5. Nutrition-Currently has an NG tube with tube feeds. Will need a PEG tube. Due to his large body habitus,GI cannot do it endoscopically and as per IR team, patient will be too heavy for a CT table and also the fluoroscopic table due to his weight. So cannot be done by interventional radiology.  Patient will need to have an open procedure for his PEG tube placement by surgical team, however will be a high risk procedure considering his body habitus and also his other multiple medical problems. He will need to be done at bariatric center or teritiary care center- not accepted at [facility] [place] and [place] at this time Continue on NG tube at this time.   6. Depression; Continue Lexapro  7.Hyperbilirubinemia could be passive congestion from congestive heart failure. hepatitis panel and ultrasound of right upper quadrant suggestive of cirrhotic changes. US showed Cirrhosis  8. Atrial fibrillation, -known history of paroxysmal atrial fibrillation. Now in A. fib with RVR. Continue on oral amidarone Appreciate cardiology consult. Risk of long-term full dose anticoagulation risks outweighs benefit at this time as per cardiac.   CODE STATUS: FULl  TOTAL CRITICAL CARE TIME SPENT IN TAKING CARE OF THIS PATIENT: 33 critical care minutes.  He remains critically ill and high risk for cardiopulmonary arrest.     Mitchell Morrow M.D on 10/08/2015   Between 7am to 6pm - Pager - [phone]  After 6pm go to [email or web address] - password [email or web address] [facility]  [place] [facility]  Office  [phone]  CC: Primary care physician; No PCP Per Patient  Note: This dictation was prepared  with Dragon dictation along with smaller phrase technology. Any transcriptional errors that result from this process are unintentional.

## 2015-10-08 NOTE — Progress Notes (Signed)
Platelet fall from 130 to 83.  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  Patient is on CRRT - possibly contributing factor.  Pt was thrombocytopenic early in admission (09/08/2015), likely due to infection.  He has been on lovenox since 09/20/15, and was switched to heparin 2 days ago.  Unclear cause of thrombocytopenia at this time.  Will stop heparin DVT prophylaxis, but also has heparin ordered with CRRT.  If platelets continue to fall he will need further evaluation.  Kristeen Miss [facility] 10/08/2015, 12:42 AM

## 2015-10-08 NOTE — Progress Notes (Signed)
RN spoke with Dr. Sung Amabile regarding patient's foley catheter and made MD aware that catheter comes apart without manipulation where seal was intact and that catheter needs to be replaced for accurate I&Os and so that bacteria is not introduced. Dr. Sung Amabile gave order to replace foley.

## 2015-10-08 NOTE — Progress Notes (Signed)
Nutrition Follow-up    INTERVENTION:   EN: recommend continuing current TF regimen (Vital High Protein at 60 ml/hr, Prostat TID, minimal free water flushes for tube maintenance only). Continue to assess   NUTRITION DIAGNOSIS:   Inadequate oral intake related to inability to eat as evidenced by NPO status. Being addressed via TF  GOAL:   Patient will meet greater than or equal to 90% of their needs  MONITOR:    (Energy Intake, Digestive System, Pulmonary Profile, Electrolyte and Renal Profile, Anthropometrics)  REASON FOR ASSESSMENT:   Ventilator, Consult Enteral/tube feeding initiation and management  ASSESSMENT:   Pt admitted with acute hypoxic and hypercapnic resp failure with acute metabolic encephalopathy from cocaine abuse leading to CHF and encephalopathy per MD note. Pt intubated this am.  Pt remains on vent via trach, on CRRT with UF target of 100 ml/hr, on levophed (74mcg/min)  EN: tolerating Vital High Protein at rate of 60 ml/hr, minimal free water flushes, Prostat TID  Digestive System: no signs of TF intolerance Skin:  Reviewed, no issues  Last BM:  1/19   Electrolyte and Renal Profile:  Recent Labs Lab 10/07/15 2321 10/08/15 0346 10/08/15 1002  BUN 100* 96* 93*  CREATININE 3.44* 3.15* 3.19*  NA 142 143 141  K 4.7 4.7 4.6  MG 2.6* 2.4 2.5*  PHOS 5.0* 4.3 4.3   Glucose Profile:  Recent Labs  10/08/15 0419 10/08/15 0728 10/08/15 1151  GLUCAP 121* 105* 109*   Meds: fentanyl drip, levophed  Height:   Ht Readings from Last 1 Encounters:  09/20/15  (1.753 m)    Weight:   Wt Readings from Last 1 Encounters:  10/08/15 645 lb 1.1 oz (292.6 kg)   Filed Weights   10/06/15 0500 10/07/15 0749 10/08/15 0745  Weight: 636 lb 12.8 oz (288.851 kg) 643 lb 1.3 oz (291.7 kg) 645 lb 1.1 oz (292.6 kg)    BMI:  Body mass index is 95.22 kg/(m^2).  Estimated Nutritional Needs:   Kcal:  1600-1817kcals (22-25kcals/kg) using  IBW of  72.7kg  Protein:  145-181g protein (2.0-2.5g/kg) using IBW of 72.7kg  Fluid:  2181-2565mL of fluid (30-55mL/kg)  EDUCATION NEEDS:   No education needs identified at this time  HIGH Care Level  Romelle Starcher MS, RD, LDN [phone] Pager  [phone] Weekend/On-Call Pager

## 2015-10-08 NOTE — Progress Notes (Signed)
PULMONARY/CCM PROGRESS NOTE  40 year old massively obese male with acute CHF, volume overload. Now with acute renal failure, vent dependent respiratory failure status post trach.  IMPRESSION: --Acute on chronic hypercarbic respiratory failure , pulmonary edema. -- Obesity hypoventilation syndrome --Atelectasis --now with oliguric renal failure. The patient is seen by nephrology, was started on CRRT 1/18. Acute systolic CHF with volume overload.  --Depression; RN notes that pt appears tearful.   PLAN/RECS:  Cont vent support -  We will continue to wean down as tolerated. Chest x-ray from 1/19 reviewed, minimally changed from yesterday. Nephro following, currently on CRRT  We'll continue tube feeds via NG tube. We'll recheck CBC, electrolytes. Replace as needed. Continued SSRI.   Lines, Tubes, etc: ETT 01/02 >> . Patient self extubated, subsequently reintubated 09/25/2015 L IJ CVL 01/02 >>   Microbiology: Blood 12/30 >>  MRSA PCR 01/02 >> NEG Urine 01/04 >> NEG Blood 01/04 >> no growth   PCT 01/04: 0.74  Antibiotics:  Levofloxacin 12/30 >> 01/01 Ceftriaxone 01/02 >> 01/02 Azithro 01/02 >> 01/02 Levofloxacin 01/04 >> 01/05 Vanc 01/05 >> 1/9 Ceftaz 01/05 >>1/9   PCT 01/04: 0.74, 0.95, 0.77  Studies/Events: 12/31 TTE:  poor quality study, LVEF 45% 12/31 RUQ: Nodular contour the liver raising the question of cirrhosis. Gallbladder wall thickening, nonspecific in appearance 01/04 night - fever, hypotension. Cultures obtained. Abx expanded -failed self extubation   Best Practice: DVT: enoxaparin SUP: enteral famotidine Nutrition: TFs    Subj: Patient arousable, does not follow commands.     Obj: Filed Vitals:   10/08/15 1100 10/08/15 1115 10/08/15 1130 10/08/15 1200  BP: 96/59  Pulse: 101 101 97 97  Temp:    98.9 F (37.2 C)  TempSrc:    Oral  Resp: 28   29  Height:      Weight:      SpO2: 94% 94% 81% 94%    Gen: morbidly  obese HEENT:  WNL Neck: CVL site clean, JVP cannot be assessed Chest: clear today somewhat prolonged exp time Cardiac: distant HS, regular, no M noted Abd: obese, soft, + BS Ext: symmetric edema, chronic stasis changes Neuro: GCS<10T  BMET    Component Value Date/Time   NA 141 10/08/2015 1002   K 4.6 10/08/2015 1002   CL 104 10/08/2015 1002   CO2 27 10/08/2015 1002   GLUCOSE 113* 10/08/2015 1002   BUN 93* 10/08/2015 1002   CREATININE 3.19* 10/08/2015 1002   CALCIUM 8.7* 10/08/2015 1002   GFRNONAA 23* 10/08/2015 1002   GFRAA 26* 10/08/2015 1002    CBC    Component Value Date/Time   WBC 9.0 10/08/2015 0346   RBC 3.99* 10/08/2015 0346   HGB 10.4* 10/08/2015 0346   HCT 34.5* 10/08/2015 0346   PLT 72* 10/08/2015 0346   MCV 86.4 10/08/2015 0346   MCH 26.1 10/08/2015 0346   MCHC 30.2* 10/08/2015 0346   RDW 21.7* 10/08/2015 0346   LYMPHSABS 0.8* 10/07/2015 1116   MONOABS 1.4* 10/07/2015 1116   EOSABS 0.5 10/07/2015 1116   BASOSABS 0.1 10/07/2015 1116    CXR:  continued atelectasis in the right lung apex. Persistent bibasilar atelectasis and effusions        -Deep Nicholos Johns, M.D.   Critical Care Attestation.  I have personally obtained a history, examined the patient, evaluated laboratory and imaging results, formulated the assessment and plan and placed orders. The Patient requires high complexity decision making for assessment and support, frequent evaluation and titration of therapies, application  of advanced monitoring technologies and extensive interpretation of multiple databases. The patient has critical illness that could lead imminently to failure of 1 or more organ systems and requires the highest level of physician preparedness to intervene.  Critical Care Time devoted to patient care services described in this note is 35 minutes and is exclusive of time spent in procedures.

## 2015-10-08 NOTE — Progress Notes (Signed)
PHARMACY - CRITICAL CARE PROGRESS NOTE  Pharmacy Consult for Constipation Prevention.  Indication: ICU Status   No Known Allergies  Patient Measurements: Height:  (175.3 cm) Weight: (!) 645 lb 1.1 oz (292.6 kg) IBW/kg (Calculated) : 70.7   Vital Signs: Temp: 99.9 F (37.7 C) (01/20 0800) Temp Source: Oral (01/20 0800) BP: 98/56 mmHg (01/20 1130) Pulse Rate: 97 (01/20 1130) Intake/Output from previous day: 01/19 0701 - 01/20 0700 In: 2412.6 [I.V.:462.6; NG/GT:1680; IV Piggyback:200] Out: 1858 [Urine:140; Stool:1; Blood:191] Intake/Output from this shift: Total I/O In: 543.6 [I.V.:83.6; NG/GT:360; IV Piggyback:100] Out: 331 [Urine:10; Other:130; Blood:191] Vent settings for last 24 hours: Vent Mode:  [-] PRVC FiO2 (%):  [45 %-50 %] 45 % Set Rate:  [25 bmp] 25 bmp Vt Set:  [500 mL] 500 mL PEEP:  [8 cmH20] 8 cmH20 Plateau Pressure:  [22 cmH20] 22 cmH20  Labs:  Recent Labs  10/06/15 0500  10/07/15 0439 10/07/15 1116  10/07/15 2321 10/08/15 0346 10/08/15 1002  WBC 9.4  --   --  9.0  --   --  9.0  --   HGB 10.9*  --   --  10.5*  --   --  10.4*  --   HCT 37.0*  --   --  35.5*  --   --  34.5*  --   PLT 130*  --   --  83*  --   --  72*  --   APTT  --   --  32  --   --   --  60*  --   CREATININE 3.27*  < > 3.81*  --   < > 3.44* 3.15* 3.19*  MG  --   < > 2.7* 2.6*  2.5*  < > 2.6* 2.4 2.5*  PHOS  --   < > 6.8*  --   < > 5.0* 4.3 4.3  ALBUMIN 2.9*  < > 3.1*  --   < > 3.1* 3.1* 3.0*  PROT 7.0  --   --   --   --   --   --   --   AST 41  --   --   --   --   --   --   --   ALT 13*  --   --   --   --   --   --   --   ALKPHOS 74  --   --   --   --   --   --   --   BILITOT 3.6*  --   --   --   --   --   --   --   < > = values in this interval not displayed. Estimated Creatinine Clearance: 68.8 mL/min (by C-G formula based on Cr of 3.19).   Recent Labs  10/08/15 0011 10/08/15 0419 10/08/15 0728  GLUCAP 110* 121* 105*    Microbiology:   Medications:   Scheduled:  . albumin human  25 g Intravenous Q12H  . amiodarone  400 mg Oral BID  . antiseptic oral rinse  7 mL Mouth Rinse 10 times per day  . budesonide  0.25 mg Nebulization 4 times per day  . chlorhexidine gluconate  15 mL Mouth Rinse BID  . docusate  100 mg Oral BID  . escitalopram  5 mg Oral Daily  . [START ON 10/09/2015] famotidine  20 mg Per Tube QODAY  . feeding supplement (PRO-STAT SUGAR FREE 64)  30 mL  Per Tube TID  . feeding supplement (VITAL HIGH PROTEIN)  1,000 mL Per Tube Q24H  . free water  30 mL Per Tube 6 times per day  . heparin subcutaneous  5,000 Units Subcutaneous 3 times per day  . Influenza vac split quadrivalent PF  0.5 mL Intramuscular Tomorrow-1000  . ipratropium-albuterol  3 mL Nebulization Q6H  . metoprolol  5 mg Intravenous 4 times per day  . nystatin   Topical BID  . sennosides  5 mL Oral BID   Infusions:  . fentaNYL infusion INTRAVENOUS 125 mcg/hr (10/08/15 0600)  . norepinephrine (LEVOPHED) Adult infusion 8.96 mcg/min (10/08/15 0600)  . pureflow 3 each (10/07/15 1735)    Assessment: Pharmacy consulted for constipation management for 40 yo male ICU patient requiring mechanical ventilation.     Plan:  Patient with last BM 01/19. Will continue scheduled senna/docusate and Miralax daily prn and f/u AM.    Luisa Hart D 10/08/2015,11:43 AM

## 2015-10-08 NOTE — Progress Notes (Signed)
[facility]  ROUNDING NOTE   Subjective:  Patient tolerating CRRT well. Circuit clotted earlier this a.m. Remains on the ventilator at present.   Objective:  Vital signs in last 24 hours:  Temp:  [97.9 F (36.6 C)-99.5 F (37.5 C)] 99.5 F (37.5 C) (01/20 0400) Pulse Rate:  [93-116] 94 (01/20 0600) Resp:  [27-30] 27 (01/19 1800) BP: (78-109)/(47-82) 101/73 mmHg (01/20 0600) SpO2:  [82 %-99 %] 94 % (01/20 0738) FiO2 (%):  [50 %-60 %] 50 % (01/20 0738) Weight:  [292.6 kg (645 lb 1.1 oz)] 292.6 kg (645 lb 1.1 oz) (01/20 0745)  Weight change:  Filed Weights   10/06/15 0500 10/07/15 0749 10/08/15 0745  Weight: 288.851 kg (636 lb 12.8 oz) 291.7 kg (643 lb 1.3 oz) 292.6 kg (645 lb 1.1 oz)    Intake/Output: I/O last 3 completed shifts: In: 3354.2 [I.V.:587.2; Other:90; ZO/XW:9604; IV Piggyback:300] Out: 1813 [Urine:160; VWUJW:1191; Stool:1]   Intake/Output this shift:     Physical Exam: General: Critically ill  Head: [place]/AT difficult to assess hearing  Eyes: Mild icterus noted  Neck: Tracheostomy in place  Lungs:  Bilateral rhonchi, vent assisted, fio2 50%  Heart: S1S2 no rubs  Abdomen:  Soft, nontender, obese  Extremities: 3+ generalized edema.  Neurologic: Awake, will turn head on command  Skin: No lesions  GU: foley    Basic Metabolic Panel:  Recent Labs Lab 10/07/15 0439 10/07/15 1116 10/07/15 1444 10/07/15 1824 10/07/15 2321 10/08/15 0346  NA 145  --  141 141 142 143  K 5.4*  --  4.8 4.7 4.7 4.7  CL 107  --  104 105 104 104  CO2 16*  --  27 28 28 27   GLUCOSE 126*  --  136* 136* 125* 120*  BUN 127*  --  110* 105* 100* 96*  CREATININE 3.81*  --  3.62* 3.40* 3.44* 3.15*  CALCIUM 8.7*  --  9.0 8.8* 9.0 9.2  MG 2.7* 2.6*  2.5*  --  2.5* 2.6* 2.4  PHOS 6.8*  --  5.8* 5.1* 5.0* 4.3    Liver Function Tests:  Recent Labs Lab 10/06/15 0500  10/07/15 0439 10/07/15 1444 10/07/15 1824 10/07/15 2321 10/08/15 0346  AST 41  --   --   --    --   --   --   ALT 13*  --   --   --   --   --   --   ALKPHOS 74  --   --   --   --   --   --   BILITOT 3.6*  --   --   --   --   --   --   PROT 7.0  --   --   --   --   --   --   ALBUMIN 2.9*  < > 3.1* 3.1* 2.9* 3.1* 3.1*  < > = values in this interval not displayed. No results for input(s): LIPASE, AMYLASE in the last 168 hours. No results for input(s): AMMONIA in the last 168 hours.  CBC:  Recent Labs Lab 10/19/2015 0607 10/05/15 0532 10/06/15 0500 10/07/15 1116 10/08/15 0346  WBC 10.3 10.7* 9.4 9.0 9.0  NEUTROABS  --   --   --  6.1  --   HGB 11.4* 11.6* 10.9* 10.5* 10.4*  HCT 38.5* 38.7* 37.0* 35.5* 34.5*  MCV 86.4 86.4 85.0 87.6 86.4  PLT 137* 137* 130* 83* 72*    Cardiac Enzymes: No results for  input(s): CKTOTAL, CKMB, CKMBINDEX, TROPONINI in the last 168 hours.  BNP: Invalid input(s): POCBNP  CBG:  Recent Labs Lab 10/07/15 1626 10/07/15 2005 10/08/15 0011 10/08/15 0419 10/08/15 0728  GLUCAP 134* 121* 110* 121* 105*    Microbiology: Results for orders placed or performed during the hospital encounter of 08/26/2015  Blood culture (routine x 2)     Status: None   Collection Time: 08/30/2015 11:53 PM  Result Value Ref Range Status   Specimen Description BLOOD LEFT HAND  Final   Special Requests BOTTLES DRAWN AEROBIC AND ANAEROBIC 4CC  Final   Culture NO GROWTH 5 DAYS  Final   Report Status 09/23/2015 FINAL  Final  Blood culture (routine x 2)     Status: None   Collection Time: 09/02/2015 11:53 PM  Result Value Ref Range Status   Specimen Description BLOOD LEFT ARM  Final   Special Requests BOTTLES DRAWN AEROBIC AND ANAEROBIC 5CC  Final   Culture NO GROWTH 5 DAYS  Final   Report Status 09/23/2015 FINAL  Final  Urine culture     Status: None   Collection Time: 09/18/15 12:21 AM  Result Value Ref Range Status   Specimen Description URINE, RANDOM  Final   Special Requests NONE  Final   Culture MULTIPLE SPECIES PRESENT, SUGGEST RECOLLECTION  Final   Report  Status 09/19/2015 FINAL  Final  Rapid Influenza A&B Antigens ([facility] only)     Status: None   Collection Time: 09/18/15  6:26 AM  Result Value Ref Range Status   Influenza A ([facility]) NOT DETECTED  Final   Influenza B ([facility]) NOT DETECTED  Final  MRSA PCR Screening     Status: None   Collection Time: 09/20/15  6:30 AM  Result Value Ref Range Status   MRSA by PCR NEGATIVE NEGATIVE Final    Comment:        The GeneXpert MRSA Assay (FDA approved for NASAL specimens only), is one component of a comprehensive MRSA colonization surveillance program. It is not intended to diagnose MRSA infection nor to guide or monitor treatment for MRSA infections.   Culture, respiratory (NON-Expectorated)     Status: None   Collection Time: 09/22/15  3:13 PM  Result Value Ref Range Status   Specimen Description TRACHEAL ASPIRATE  Final   Special Requests NONE  Final   Gram Stain   Final    GOOD SPECIMEN - 80-90% WBCS MODERATE WBC SEEN RARE YEAST RARE GRAM NEGATIVE COCCOBACILLI    Culture LIGHT GROWTH CANDIDA TROPICALIS  Final   Report Status 09/26/2015 FINAL  Final  Urine culture     Status: None   Collection Time: 09/22/15 11:16 PM  Result Value Ref Range Status   Specimen Description URINE, RANDOM  Final   Special Requests NONE  Final   Culture NO GROWTH 2 DAYS  Final   Report Status 09/24/2015 FINAL  Final  CULTURE, BLOOD (ROUTINE X 2) w Reflex to PCR ID Panel     Status: None   Collection Time: 09/22/15 11:44 PM  Result Value Ref Range Status   Specimen Description BLOOD RIGHT ANTECUBITAL  Final   Special Requests BOTTLES DRAWN AEROBIC AND ANAEROBIC  Final   Culture NO GROWTH 5 DAYS  Final   Report Status 09/28/2015 FINAL  Final  CULTURE, BLOOD (ROUTINE X 2) w Reflex to PCR ID Panel     Status: None   Collection Time: 09/22/15 11:56 PM  Result Value Ref Range Status   Specimen  Description BLOOD LEFT HAND  Final   Special Requests BOTTLES DRAWN AEROBIC AND ANAEROBIC  Final    Culture NO GROWTH 5 DAYS  Final   Report Status 09/28/2015 FINAL  Final  Culture, respiratory (NON-Expectorated)     Status: None   Collection Time: 09/23/15 12:01 AM  Result Value Ref Range Status   Specimen Description TRACHEAL ASPIRATE  Final   Special Requests NONE  Final   Gram Stain   Final    GOOD SPECIMEN - 80-90% WBCS MODERATE WBC SEEN RARE YEAST RARE GRAM POSITIVE COCCI    Culture Consistent with normal respiratory flora.  Final   Report Status 09/25/2015 FINAL  Final  Urine culture     Status: None   Collection Time: 10/01/15 10:35 AM  Result Value Ref Range Status   Specimen Description URINE, RANDOM  Final   Special Requests NONE  Final   Culture NO GROWTH 2 DAYS  Final   Report Status 10/03/2015 FINAL  Final  CULTURE, BLOOD (ROUTINE X 2) w Reflex to PCR ID Panel     Status: None   Collection Time: 10/01/15 10:56 AM  Result Value Ref Range Status   Specimen Description BLOOD RIGHT ARM  Final   Special Requests   Final    BOTTLES DRAWN AEROBIC AND ANAEROBIC  AER 3CC ANA 5CC   Culture NO GROWTH 5 DAYS  Final   Report Status 10/06/2015 FINAL  Final  CULTURE, BLOOD (ROUTINE X 2) w Reflex to PCR ID Panel     Status: None   Collection Time: 10/01/15 11:14 AM  Result Value Ref Range Status   Specimen Description BLOOD LEFT HAND  Final   Special Requests   Final    BOTTLES DRAWN AEROBIC AND ANAEROBIC  AER 3CC ANA 4CC   Culture  Setup Time   Final    GRAM POSITIVE COCCI ANAEROBIC BOTTLE ONLY CRITICAL RESULT CALLED TO, READ BACK BY AND VERIFIED WITH: CRYSTAL SCARPENA AT 0915 ON 10/02/15 CTJ    Culture   Final    COAGULASE NEGATIVE STAPHYLOCOCCUS ANAEROBIC BOTTLE ONLY Results consistent with contamination.    Report Status 10/06/2015 FINAL  Final  Blood Culture ID Panel (Reflexed)     Status: Abnormal   Collection Time: 10/01/15 11:14 AM  Result Value Ref Range Status   Enterococcus species NOT DETECTED NOT DETECTED Final   Listeria monocytogenes NOT DETECTED NOT  DETECTED Final   Staphylococcus species DETECTED (A) NOT DETECTED Final   Staphylococcus aureus NOT DETECTED NOT DETECTED Final   Streptococcus species NOT DETECTED NOT DETECTED Final   Streptococcus agalactiae NOT DETECTED NOT DETECTED Final   Streptococcus pneumoniae NOT DETECTED NOT DETECTED Final   Streptococcus pyogenes NOT DETECTED NOT DETECTED Final   Acinetobacter baumannii NOT DETECTED NOT DETECTED Final   Enterobacteriaceae species NOT DETECTED NOT DETECTED Final   Enterobacter cloacae complex NOT DETECTED NOT DETECTED Final   Escherichia coli NOT DETECTED NOT DETECTED Final   Klebsiella oxytoca NOT DETECTED NOT DETECTED Final   Klebsiella pneumoniae NOT DETECTED NOT DETECTED Final   Proteus species NOT DETECTED NOT DETECTED Final   Serratia marcescens NOT DETECTED NOT DETECTED Final   Haemophilus influenzae NOT DETECTED NOT DETECTED Final   Neisseria meningitidis NOT DETECTED NOT DETECTED Final   Pseudomonas aeruginosa NOT DETECTED NOT DETECTED Final   Candida albicans NOT DETECTED NOT DETECTED Final   Candida glabrata NOT DETECTED NOT DETECTED Final   Candida krusei NOT DETECTED NOT DETECTED Final   Candida  parapsilosis NOT DETECTED NOT DETECTED Final   Candida tropicalis NOT DETECTED NOT DETECTED Final   Carbapenem resistance NOT DETECTED NOT DETECTED Final   Methicillin resistance DETECTED (A) NOT DETECTED Final    Comment: CRITICAL RESULT CALLED TO, READ BACK BY AND VERIFIED WITH: CRYSTAL SCARPENA FOR STAPHYLOCOCCUS SPECIES AND METHICILLIN RESISTANCE AT 0915 ON 10/02/15 CTJ    Vancomycin resistance NOT DETECTED NOT DETECTED Final  Culture, expectorated sputum-assessment     Status: None   Collection Time: 10/01/15 11:51 AM  Result Value Ref Range Status   Specimen Description EXPECTORATED SPUTUM  Final   Special Requests Normal  Final   Sputum evaluation THIS SPECIMEN IS ACCEPTABLE FOR SPUTUM CULTURE  Final   Report Status 10/01/2015 FINAL  Final  Culture,  respiratory (NON-Expectorated)     Status: None   Collection Time: 10/01/15 11:51 AM  Result Value Ref Range Status   Specimen Description EXPECTORATED SPUTUM  Final   Special Requests Normal Reflexed from [id number]  Final   Gram Stain   Final    GOOD SPECIMEN - 80-90% WBCS MODERATE WBC SEEN FEW YEAST    Culture   Final    LIGHT GROWTH CANDIDA ALBICANS LIGHT GROWTH CANDIDA DUBLINIENSIS    Report Status 10/06/2015 FINAL  Final    Coagulation Studies: No results for input(s): LABPROT, INR in the last 72 hours.  Urinalysis: No results for input(s): COLORURINE, LABSPEC, PHURINE, GLUCOSEU, HGBUR, BILIRUBINUR, KETONESUR, PROTEINUR, UROBILINOGEN, NITRITE, LEUKOCYTESUR in the last 72 hours.  Invalid input(s): APPERANCEUR    Imaging: Dg Chest Port 1 View  10/06/2015  CLINICAL DATA:  Central line placement EXAM: PORTABLE CHEST 1 VIEW COMPARISON:  10/06/2015 FINDINGS: Cardiomegaly again noted. Persistent central vascular congestion and mild interstitial edema bilaterally. Tracheostomy tube is unchanged in position. Stable left IJ central line position. There is new right IJ central line with tip in SVC right atrium junction. No pneumothorax. IMPRESSION: Persistent mild congestion/ edema. Cardiomegaly again noted. Stable left IJ central line position. There is new right IJ central line with tip in SVC right atrium junction. No pneumothorax. Electronically Signed   By: Natasha Mead M.D.   On: 10/06/2015 19:08     Medications:   . fentaNYL infusion INTRAVENOUS 125 mcg/hr (10/08/15 0600)  . norepinephrine (LEVOPHED) Adult infusion 8.96 mcg/min (10/08/15 0600)  . pureflow 3 each (10/07/15 1735)   . albumin human  25 g Intravenous Q12H  . amiodarone  400 mg Oral BID  . antiseptic oral rinse  7 mL Mouth Rinse 10 times per day  . budesonide  0.25 mg Nebulization 4 times per day  . chlorhexidine gluconate  15 mL Mouth Rinse BID  . docusate  100 mg Oral BID  . escitalopram  5 mg Oral Daily  .  [START ON 10/09/2015] famotidine  20 mg Per Tube QODAY  . feeding supplement (PRO-STAT SUGAR FREE 64)  30 mL Per Tube TID  . feeding supplement (VITAL HIGH PROTEIN)  1,000 mL Per Tube Q24H  . free water  30 mL Per Tube 6 times per day  . Influenza vac split quadrivalent PF  0.5 mL Intramuscular Tomorrow-1000  . ipratropium-albuterol  3 mL Nebulization Q6H  . metoprolol  5 mg Intravenous 4 times per day  . nystatin   Topical BID  . sennosides  5 mL Oral BID   acetaminophen **OR** acetaminophen, albuterol, bisacodyl, calcium carbonate, fentaNYL (SUBLIMAZE) injection, heparin, ibuprofen, LORazepam, ondansetron **OR** ondansetron (ZOFRAN) IV, polyethylene glycol, sodium chloride  Assessment/ Plan:  Mr.  Mitchell Morrow is a 40 y.o. black male with morbid obesity, tobacco abuse, without prescribed home medications, who was admitted to [facility] on 09/15/2015, s/p tracheostomy placement, persistent respiratory failure, multiple episodes of ARF  1. Acute Renal Failure:  Second episode of ARF, reconsulted 10/05/15, hypotension contributing now. Started renal replacement therapy 10/06/15. -  Patient tolerating CRRT well currently. Continue ultrafiltration target of 100 cc per hour. Continue to monitor renal function electrolytes and urine output.   2. Acute respiratory failure with bilateral pneumonia and acute exacerbation of systolic congestive heart failure:  FiO2 currently stable at 50%. Continue ventilatory support.  3.  Generalized edema:  Continue ultrafiltration target of 100 cc per hour.  4.  Hypotension: Continue Levophed to maintain map of 65 or greater..   LOS: 21 Mitchell Morrow 1/20/20177:51 AM

## 2015-10-09 ENCOUNTER — Inpatient Hospital Stay: Payer: Medicaid Other

## 2015-10-09 LAB — CBC
HEMATOCRIT: 33.7 % — AB (ref 40.0–52.0)
Hemoglobin: 10.2 g/dL — ABNORMAL LOW (ref 13.0–18.0)
MCH: 26.2 pg (ref 26.0–34.0)
MCHC: 30.2 g/dL — ABNORMAL LOW (ref 32.0–36.0)
MCV: 86.9 fL (ref 80.0–100.0)
PLATELETS: 58 10*3/uL — AB (ref 150–440)
RBC: 3.87 MIL/uL — ABNORMAL LOW (ref 4.40–5.90)
RDW: 22.3 % — AB (ref 11.5–14.5)
WBC: 8.4 10*3/uL (ref 3.8–10.6)

## 2015-10-09 LAB — RENAL FUNCTION PANEL
ALBUMIN: 3.2 g/dL — AB (ref 3.5–5.0)
ANION GAP: 10 (ref 5–15)
Albumin: 3.2 g/dL — ABNORMAL LOW (ref 3.5–5.0)
Albumin: 3.3 g/dL — ABNORMAL LOW (ref 3.5–5.0)
Anion gap: 9 (ref 5–15)
Anion gap: 9 (ref 5–15)
BUN: 73 mg/dL — ABNORMAL HIGH (ref 6–20)
BUN: 74 mg/dL — ABNORMAL HIGH (ref 6–20)
BUN: 80 mg/dL — AB (ref 6–20)
CHLORIDE: 106 mmol/L (ref 101–111)
CO2: 27 mmol/L (ref 22–32)
CO2: 27 mmol/L (ref 22–32)
CO2: 28 mmol/L (ref 22–32)
CREATININE: 2.79 mg/dL — AB (ref 0.61–1.24)
Calcium: 9.1 mg/dL (ref 8.9–10.3)
Calcium: 9.1 mg/dL (ref 8.9–10.3)
Calcium: 9.2 mg/dL (ref 8.9–10.3)
Chloride: 104 mmol/L (ref 101–111)
Chloride: 104 mmol/L (ref 101–111)
Creatinine, Ser: 2.69 mg/dL — ABNORMAL HIGH (ref 0.61–1.24)
Creatinine, Ser: 2.73 mg/dL — ABNORMAL HIGH (ref 0.61–1.24)
GFR calc Af Amer: 32 mL/min — ABNORMAL LOW (ref >60)
GFR calc Af Amer: 32 mL/min — ABNORMAL LOW (ref >60)
GFR calc non Af Amer: 28 mL/min — ABNORMAL LOW (ref >60)
GFR, EST AFRICAN AMERICAN: 31 mL/min — AB (ref >60)
GFR, EST NON AFRICAN AMERICAN: 27 mL/min — AB (ref >60)
GFR, EST NON AFRICAN AMERICAN: 27 mL/min — AB (ref >60)
Glucose, Bld: 119 mg/dL — ABNORMAL HIGH (ref 65–99)
Glucose, Bld: 128 mg/dL — ABNORMAL HIGH (ref 65–99)
Glucose, Bld: 134 mg/dL — ABNORMAL HIGH (ref 65–99)
PHOSPHORUS: 3.2 mg/dL (ref 2.5–4.6)
PHOSPHORUS: 3.4 mg/dL (ref 2.5–4.6)
POTASSIUM: 4.6 mmol/L (ref 3.5–5.1)
POTASSIUM: 4.6 mmol/L (ref 3.5–5.1)
Phosphorus: 4.1 mg/dL (ref 2.5–4.6)
Potassium: 5 mmol/L (ref 3.5–5.1)
Sodium: 141 mmol/L (ref 135–145)
Sodium: 141 mmol/L (ref 135–145)
Sodium: 142 mmol/L (ref 135–145)

## 2015-10-09 LAB — BASIC METABOLIC PANEL
Anion gap: 9 (ref 5–15)
Anion gap: 8 (ref 5–15)
BUN: 77 mg/dL — AB (ref 6–20)
BUN: 71 mg/dL — ABNORMAL HIGH (ref 6–20)
CALCIUM: 9 mg/dL (ref 8.9–10.3)
CO2: 28 mmol/L (ref 22–32)
CO2: 28 mmol/L (ref 22–32)
Calcium: 9.1 mg/dL (ref 8.9–10.3)
Chloride: 104 mmol/L (ref 101–111)
Chloride: 105 mmol/L (ref 101–111)
Creatinine, Ser: 2.65 mg/dL — ABNORMAL HIGH (ref 0.61–1.24)
Creatinine, Ser: 2.45 mg/dL — ABNORMAL HIGH (ref 0.61–1.24)
GFR calc Af Amer: 33 mL/min — ABNORMAL LOW (ref >60)
GFR calc Af Amer: 36 mL/min — ABNORMAL LOW (ref >60)
GFR calc non Af Amer: 31 mL/min — ABNORMAL LOW (ref >60)
GFR, EST NON AFRICAN AMERICAN: 28 mL/min — AB (ref >60)
GLUCOSE: 131 mg/dL — AB (ref 65–99)
Glucose, Bld: 155 mg/dL — ABNORMAL HIGH (ref 65–99)
POTASSIUM: 4.6 mmol/L (ref 3.5–5.1)
Potassium: 4.9 mmol/L (ref 3.5–5.1)
Sodium: 141 mmol/L (ref 135–145)
Sodium: 141 mmol/L (ref 135–145)

## 2015-10-09 LAB — GLUCOSE, CAPILLARY
GLUCOSE-CAPILLARY: 130 mg/dL — AB (ref 65–99)
GLUCOSE-CAPILLARY: 131 mg/dL — AB (ref 65–99)
Glucose-Capillary: 115 mg/dL — ABNORMAL HIGH (ref 65–99)
Glucose-Capillary: 125 mg/dL — ABNORMAL HIGH (ref 65–99)
Glucose-Capillary: 111 mg/dL — ABNORMAL HIGH (ref 65–99)

## 2015-10-09 LAB — MAGNESIUM
MAGNESIUM: 2.1 mg/dL (ref 1.7–2.4)
MAGNESIUM: 2.3 mg/dL (ref 1.7–2.4)
Magnesium: 2.2 mg/dL (ref 1.7–2.4)
Magnesium: 2.1 mg/dL (ref 1.7–2.4)

## 2015-10-09 LAB — BLOOD GAS, ARTERIAL
ACID-BASE DEFICIT: 2.2 mmol/L — AB (ref 0.0–2.0)
Allens test (pass/fail): POSITIVE — AB
BICARBONATE: 28.9 meq/L — AB (ref 21.0–28.0)
FIO2: 0.6
LHR: 16 {breaths}/min
O2 SAT: 90.3 %
PCO2 ART: 83 mmHg — AB (ref 32.0–48.0)
PEEP/CPAP: 5 cmH2O
PH ART: 7.15 — AB (ref 7.350–7.450)
Patient temperature: 37
VT: 500 mL
pO2, Arterial: 76 mmHg — ABNORMAL LOW (ref 83.0–108.0)

## 2015-10-09 LAB — ALBUMIN: Albumin: 3 g/dL — ABNORMAL LOW (ref 3.5–5.0)

## 2015-10-09 LAB — APTT: aPTT: 65 seconds — ABNORMAL HIGH (ref 24–36)

## 2015-10-09 LAB — PHOSPHORUS: Phosphorus: 3.3 mg/dL (ref 2.5–4.6)

## 2015-10-09 MED ORDER — ALTEPLASE 2 MG IJ SOLR
2.0000 mg | Freq: Once | INTRAMUSCULAR | Status: AC
  Administered 2015-10-09: 2 mg
  Filled 2015-10-09: qty 2

## 2015-10-09 MED ORDER — METOPROLOL TARTRATE 1 MG/ML IV SOLN
2.5000 mg | INTRAVENOUS | Status: DC | PRN
  Administered 2015-10-13: 5 mg via INTRAVENOUS
  Administered 2015-10-15 (×3): 2.5 mg via INTRAVENOUS
  Filled 2015-10-09 (×3): qty 5

## 2015-10-09 MED ORDER — ALTEPLASE 2 MG IJ SOLR
2.0000 mg | Freq: Once | INTRAMUSCULAR | Status: AC
  Administered 2015-10-09: 2 mg

## 2015-10-09 MED ORDER — AMIODARONE HCL 200 MG PO TABS
400.0000 mg | ORAL_TABLET | Freq: Two times a day (BID) | ORAL | Status: DC
  Administered 2015-10-09 – 2015-10-11 (×4): 400 mg
  Filled 2015-10-09 (×4): qty 2

## 2015-10-09 NOTE — Progress Notes (Signed)
Restarted CRRT.

## 2015-10-09 NOTE — Progress Notes (Signed)
eLink Physician-Brief Progress Note Patient Name: Mitchell Morrow DOB: October 28, 1975 MRN: [id number]   Date of Service  10/09/2015  HPI/Events of Note  ABG on 60%/PRVC 24/TV 500/P8 = 7.20/72/76.  eICU Interventions  Will order: 1. Increase PRVC to 28.  2. ABG in AM.      Intervention Category Major Interventions: Respiratory failure - evaluation and management  Mitchell Morrow 10/09/2015, 7:15 PM

## 2015-10-09 NOTE — Progress Notes (Signed)
[facility]  ROUNDING NOTE   Subjective:  Patient continues to have very little urine output. Ultrafiltration achieved yesterday was 1.9 L. Patient was slightly net positive yesterday. Remains on the ventilator at this time.   Objective:  Vital signs in last 24 hours:  Temp:  [97.6 F (36.4 C)-98.9 F (37.2 C)] 97.6 F (36.4 C) (01/21 0800) Pulse Rate:  [87-102] 94 (01/21 1000) Resp:  [18-29] 18 (01/21 1000) BP: (86-109)/(51-72) 103/63 mmHg (01/21 1000) SpO2:  [81 %-100 %] 95 % (01/21 1000) FiO2 (%):  [40 %-45 %] 40 % (01/21 0828) Weight:  [292.4 kg (644 lb 10 oz)] 292.4 kg (644 lb 10 oz) (01/21 0500)  Weight change: 0.9 kg (1 lb 15.8 oz) Filed Weights   10/07/15 0749 10/08/15 0745 10/09/15 0500  Weight: 291.7 kg (643 lb 1.3 oz) 292.6 kg (645 lb 1.1 oz) 292.4 kg (644 lb 10 oz)    Intake/Output: I/O last 3 completed shifts: In: 3758.3 [I.V.:768.3; Other:60; NG/GT:2630; IV Piggyback:300] Out: 3261 [Urine:125; NWGNF:6213; Blood:191]   Intake/Output this shift:  Total I/O In: 430 [I.V.:60; NG/GT:270; IV Piggyback:100] Out: 278 [Other:278]  Physical Exam: General: Critically ill  Head: [place]/AT difficult to assess hearing  Eyes: Mild icterus noted  Neck: Tracheostomy in place  Lungs:  Bilateral rhonchi, vent assisted, fio2 40%  Heart: S1S2 no rubs  Abdomen:  Soft, nontender, obese  Extremities: 3+ generalized edema.  Neurologic: Awake, will turn head on command  Skin: No lesions  GU: foley    Basic Metabolic Panel:  Recent Labs Lab 10/08/15 1530 10/08/15 1853 10/08/15 2332 10/09/15 0319 10/09/15 0809  NA 143 141 141 141 142  K 4.6 4.6 4.6 4.6 4.6  CL 103 104 104 104 106  CO2 GLUCOSE 118* 121* 134* 131* 119*  BUN 88* 84* 80* 77* 74*  CREATININE 3.03* 2.94* 2.73* 2.65* 2.79*  CALCIUM 9.2 9.0 9.1 9.0 9.1  MG 2.3 2.3 2.3 2.2 2.1  PHOS 3.8 3.6 3.4 3.3 3.2    Liver Function Tests:  Recent Labs Lab 10/06/15 0500   10/08/15 1530 10/08/15 1853 10/08/15 2332 10/09/15 0319 10/09/15 0809  AST 41  --   --   --   --   --   --   ALT 13*  --   --   --   --   --   --   ALKPHOS 74  --   --   --   --   --   --   BILITOT 3.6*  --   --   --   --   --   --   PROT 7.0  --   --   --   --   --   --   ALBUMIN 2.9*  < > 3.1* 3.1* 3.2* 3.0* 3.2*  < > = values in this interval not displayed. No results for input(s): LIPASE, AMYLASE in the last 168 hours. No results for input(s): AMMONIA in the last 168 hours.  CBC:  Recent Labs Lab 10/05/15 0532 10/06/15 0500 10/07/15 1116 10/08/15 0346 10/09/15 0319  WBC 10.7* 9.4 9.0 9.0 8.4  NEUTROABS  --   --  6.1  --   --   HGB 11.6* 10.9* 10.5* 10.4* 10.2*  HCT 38.7* 37.0* 35.5* 34.5* 33.7*  MCV 86.4 85.0 87.6 86.4 86.9  PLT 137* 130* 83* 72* 58*    Cardiac Enzymes: No results for input(s): CKTOTAL, CKMB, CKMBINDEX, TROPONINI in the last  168 hours.  BNP: Invalid input(s): POCBNP  CBG:  Recent Labs Lab 10/08/15 1647 10/08/15 1934 10/08/15 2345 10/09/15 0338 10/09/15 0734  GLUCAP 110* 110* 139* 130* 115*    Microbiology: Results for orders placed or performed during the hospital encounter of 09/16/2015  Blood culture (routine x 2)     Status: None   Collection Time: 08/21/2015 11:53 PM  Result Value Ref Range Status   Specimen Description BLOOD LEFT HAND  Final   Special Requests BOTTLES DRAWN AEROBIC AND ANAEROBIC 4CC  Final   Culture NO GROWTH 5 DAYS  Final   Report Status 09/23/2015 FINAL  Final  Blood culture (routine x 2)     Status: None   Collection Time: 09/15/2015 11:53 PM  Result Value Ref Range Status   Specimen Description BLOOD LEFT ARM  Final   Special Requests BOTTLES DRAWN AEROBIC AND ANAEROBIC 5CC  Final   Culture NO GROWTH 5 DAYS  Final   Report Status 09/23/2015 FINAL  Final  Urine culture     Status: None   Collection Time: 09/18/15 12:21 AM  Result Value Ref Range Status   Specimen Description URINE, RANDOM  Final   Special  Requests NONE  Final   Culture MULTIPLE SPECIES PRESENT, SUGGEST RECOLLECTION  Final   Report Status 09/19/2015 FINAL  Final  Rapid Influenza A&B Antigens ([facility] only)     Status: None   Collection Time: 09/18/15  6:26 AM  Result Value Ref Range Status   Influenza A ([facility]) NOT DETECTED  Final   Influenza B ([facility]) NOT DETECTED  Final  MRSA PCR Screening     Status: None   Collection Time: 09/20/15  6:30 AM  Result Value Ref Range Status   MRSA by PCR NEGATIVE NEGATIVE Final    Comment:        The GeneXpert MRSA Assay (FDA approved for NASAL specimens only), is one component of a comprehensive MRSA colonization surveillance program. It is not intended to diagnose MRSA infection nor to guide or monitor treatment for MRSA infections.   Culture, respiratory (NON-Expectorated)     Status: None   Collection Time: 09/22/15  3:13 PM  Result Value Ref Range Status   Specimen Description TRACHEAL ASPIRATE  Final   Special Requests NONE  Final   Gram Stain   Final    GOOD SPECIMEN - 80-90% WBCS MODERATE WBC SEEN RARE YEAST RARE GRAM NEGATIVE COCCOBACILLI    Culture LIGHT GROWTH CANDIDA TROPICALIS  Final   Report Status 09/26/2015 FINAL  Final  Urine culture     Status: None   Collection Time: 09/22/15 11:16 PM  Result Value Ref Range Status   Specimen Description URINE, RANDOM  Final   Special Requests NONE  Final   Culture NO GROWTH 2 DAYS  Final   Report Status 09/24/2015 FINAL  Final  CULTURE, BLOOD (ROUTINE X 2) w Reflex to PCR ID Panel     Status: None   Collection Time: 09/22/15 11:44 PM  Result Value Ref Range Status   Specimen Description BLOOD RIGHT ANTECUBITAL  Final   Special Requests BOTTLES DRAWN AEROBIC AND ANAEROBIC  Final   Culture NO GROWTH 5 DAYS  Final   Report Status 09/28/2015 FINAL  Final  CULTURE, BLOOD (ROUTINE X 2) w Reflex to PCR ID Panel     Status: None   Collection Time: 09/22/15 11:56 PM  Result Value Ref Range Status   Specimen Description  BLOOD LEFT HAND  Final  Special Requests BOTTLES DRAWN AEROBIC AND ANAEROBIC  Final   Culture NO GROWTH 5 DAYS  Final   Report Status 09/28/2015 FINAL  Final  Culture, respiratory (NON-Expectorated)     Status: None   Collection Time: 09/23/15 12:01 AM  Result Value Ref Range Status   Specimen Description TRACHEAL ASPIRATE  Final   Special Requests NONE  Final   Gram Stain   Final    GOOD SPECIMEN - 80-90% WBCS MODERATE WBC SEEN RARE YEAST RARE GRAM POSITIVE COCCI    Culture Consistent with normal respiratory flora.  Final   Report Status 09/25/2015 FINAL  Final  Urine culture     Status: None   Collection Time: 10/01/15 10:35 AM  Result Value Ref Range Status   Specimen Description URINE, RANDOM  Final   Special Requests NONE  Final   Culture NO GROWTH 2 DAYS  Final   Report Status 10/03/2015 FINAL  Final  CULTURE, BLOOD (ROUTINE X 2) w Reflex to PCR ID Panel     Status: None   Collection Time: 10/01/15 10:56 AM  Result Value Ref Range Status   Specimen Description BLOOD RIGHT ARM  Final   Special Requests   Final    BOTTLES DRAWN AEROBIC AND ANAEROBIC  AER 3CC ANA 5CC   Culture NO GROWTH 5 DAYS  Final   Report Status 10/06/2015 FINAL  Final  CULTURE, BLOOD (ROUTINE X 2) w Reflex to PCR ID Panel     Status: None   Collection Time: 10/01/15 11:14 AM  Result Value Ref Range Status   Specimen Description BLOOD LEFT HAND  Final   Special Requests   Final    BOTTLES DRAWN AEROBIC AND ANAEROBIC  AER 3CC ANA 4CC   Culture  Setup Time   Final    GRAM POSITIVE COCCI ANAEROBIC BOTTLE ONLY CRITICAL RESULT CALLED TO, READ BACK BY AND VERIFIED WITH: CRYSTAL SCARPENA AT 0915 ON 10/02/15 CTJ    Culture   Final    COAGULASE NEGATIVE STAPHYLOCOCCUS ANAEROBIC BOTTLE ONLY Results consistent with contamination.    Report Status 10/06/2015 FINAL  Final  Blood Culture ID Panel (Reflexed)     Status: Abnormal   Collection Time: 10/01/15 11:14 AM  Result Value Ref Range Status    Enterococcus species NOT DETECTED NOT DETECTED Final   Listeria monocytogenes NOT DETECTED NOT DETECTED Final   Staphylococcus species DETECTED (A) NOT DETECTED Final   Staphylococcus aureus NOT DETECTED NOT DETECTED Final   Streptococcus species NOT DETECTED NOT DETECTED Final   Streptococcus agalactiae NOT DETECTED NOT DETECTED Final   Streptococcus pneumoniae NOT DETECTED NOT DETECTED Final   Streptococcus pyogenes NOT DETECTED NOT DETECTED Final   Acinetobacter baumannii NOT DETECTED NOT DETECTED Final   Enterobacteriaceae species NOT DETECTED NOT DETECTED Final   Enterobacter cloacae complex NOT DETECTED NOT DETECTED Final   Escherichia coli NOT DETECTED NOT DETECTED Final   Klebsiella oxytoca NOT DETECTED NOT DETECTED Final   Klebsiella pneumoniae NOT DETECTED NOT DETECTED Final   Proteus species NOT DETECTED NOT DETECTED Final   Serratia marcescens NOT DETECTED NOT DETECTED Final   Haemophilus influenzae NOT DETECTED NOT DETECTED Final   Neisseria meningitidis NOT DETECTED NOT DETECTED Final   Pseudomonas aeruginosa NOT DETECTED NOT DETECTED Final   Candida albicans NOT DETECTED NOT DETECTED Final   Candida glabrata NOT DETECTED NOT DETECTED Final   Candida krusei NOT DETECTED NOT DETECTED Final   Candida parapsilosis NOT DETECTED NOT DETECTED Final   Candida  tropicalis NOT DETECTED NOT DETECTED Final   Carbapenem resistance NOT DETECTED NOT DETECTED Final   Methicillin resistance DETECTED (A) NOT DETECTED Final    Comment: CRITICAL RESULT CALLED TO, READ BACK BY AND VERIFIED WITH: CRYSTAL SCARPENA FOR STAPHYLOCOCCUS SPECIES AND METHICILLIN RESISTANCE AT 0915 ON 10/02/15 CTJ    Vancomycin resistance NOT DETECTED NOT DETECTED Final  Culture, expectorated sputum-assessment     Status: None   Collection Time: 10/01/15 11:51 AM  Result Value Ref Range Status   Specimen Description EXPECTORATED SPUTUM  Final   Special Requests Normal  Final   Sputum evaluation THIS SPECIMEN IS  ACCEPTABLE FOR SPUTUM CULTURE  Final   Report Status 10/01/2015 FINAL  Final  Culture, respiratory (NON-Expectorated)     Status: None   Collection Time: 10/01/15 11:51 AM  Result Value Ref Range Status   Specimen Description EXPECTORATED SPUTUM  Final   Special Requests Normal Reflexed from [id number]  Final   Gram Stain   Final    GOOD SPECIMEN - 80-90% WBCS MODERATE WBC SEEN FEW YEAST    Culture   Final    LIGHT GROWTH CANDIDA ALBICANS LIGHT GROWTH CANDIDA DUBLINIENSIS    Report Status 10/06/2015 FINAL  Final  Culture, expectorated sputum-assessment     Status: None   Collection Time: 10/08/15  3:30 PM  Result Value Ref Range Status   Specimen Description TRACHEAL ASPIRATE  Final   Special Requests NONE  Final   Sputum evaluation THIS SPECIMEN IS ACCEPTABLE FOR SPUTUM CULTURE  Final   Report Status 10/08/2015 FINAL  Final    Coagulation Studies: No results for input(s): LABPROT, INR in the last 72 hours.  Urinalysis: No results for input(s): COLORURINE, LABSPEC, PHURINE, GLUCOSEU, HGBUR, BILIRUBINUR, KETONESUR, PROTEINUR, UROBILINOGEN, NITRITE, LEUKOCYTESUR in the last 72 hours.  Invalid input(s): APPERANCEUR    Imaging: Dg Chest 1 View  10/09/2015  CLINICAL DATA:  Dyspnea. EXAM: CHEST 1 VIEW COMPARISON:  10/06/2015 FINDINGS: Study is degraded by motion artifact. Tracheostomy tube, right IJ central venous catheter sheath and left IJ central venous catheter are stable. Enteric catheter is poorly visualized. The cardiac silhouette is enlarged. Mediastinal contours appear intact. There is no evidence of focal airspace consolidation, pleural effusion or pneumothorax. There is low lung volume with bibasilar streaky opacities, likely representing atelectasis, left worse than right. There is mild pulmonary vascular congestion. Osseous structures are without acute abnormality. Soft tissues are grossly normal. IMPRESSION: Low lung volumes with bibasilar subsegmental atelectasis, left worse  than right. Enlarged cardiac silhouette. Persistent mild pulmonary vascular congestion. Electronically Signed   By: Ted Mcalpine M.D.   On: 10/09/2015 09:56     Medications:   . fentaNYL infusion INTRAVENOUS 125 mcg/hr (10/09/15 1000)  . norepinephrine (LEVOPHED) Adult infusion 8 mcg/min (10/09/15 0300)  . pureflow 2,500 mL (10/09/15 0125)   . amiodarone  400 mg Per Tube BID  . antiseptic oral rinse  7 mL Mouth Rinse 10 times per day  . budesonide  0.25 mg Nebulization 4 times per day  . chlorhexidine gluconate  15 mL Mouth Rinse BID  . docusate  100 mg Oral BID  . famotidine  20 mg Per Tube QODAY  . feeding supplement (PRO-STAT SUGAR FREE 64)  30 mL Per Tube TID  . feeding supplement (VITAL HIGH PROTEIN)  1,000 mL Per Tube Q24H  . free water  30 mL Per Tube 6 times per day  . heparin subcutaneous  5,000 Units Subcutaneous 3 times per day  . Influenza vac  split quadrivalent PF  0.5 mL Intramuscular Tomorrow-1000  . ipratropium-albuterol  3 mL Nebulization Q6H  . nystatin   Topical BID  . sennosides  5 mL Oral BID   acetaminophen **OR** acetaminophen, albuterol, bisacodyl, calcium carbonate, fentaNYL (SUBLIMAZE) injection, heparin, LORazepam, metoprolol, ondansetron **OR** ondansetron (ZOFRAN) IV, polyethylene glycol, sodium chloride  Assessment/ Plan:  Mr. Mitchell Morrow is a 40 y.o. black male with morbid obesity, tobacco abuse, without prescribed home medications, who was admitted to [facility] on October 10, 2015, s/p tracheostomy placement, persistent respiratory failure, multiple episodes of ARF  1. Acute Renal Failure:  Second episode of ARF, reconsulted 10/05/15, hypotension contributing now. Started renal replacement therapy 10/06/15. -  Oliguria persists at this time. Continue continuous renal replacement therapy. Increase ultrafiltration target to 130 cc per hour..   2. Acute respiratory failure with bilateral pneumonia and acute exacerbation of systolic congestive heart failure:   FiO2 currently down to 40%. We will continue ultrafiltration with continuous renal placement therapy which should help lower FiO2 percentage over time.  3.  Generalized edema:  Increase ultrafiltration target to 130 cc per hour..  4.  Hypotension: Maintain map of 65 or greater to help improve renal perfusion.   LOS: 22 Burrell Hodapp 1/21/201710:29 AM

## 2015-10-09 NOTE — Progress Notes (Addendum)
[facility] - [place] [facility]   PATIENT NAME: Mitchell Morrow    MR#:  [id number]  DATE OF BIRTH:  02-23-1976  SUBJECTIVE:  Patient does not follow commands.  REVIEW OF SYSTEMS:   Review of Systems  Unable to perform ROS: critical illness    DRUG ALLERGIES:  No Known Allergies  VITALS:  Blood pressure 103/63, pulse 94, temperature 97.6 F (36.4 C), temperature source Oral, resp. rate 18, height  (1.753 m), weight 292.4 kg (644 lb 10 oz), SpO2 95 %.  PHYSICAL EXAMINATION:   Physical Exam  Constitutional: He is well-developed, well-nourished, and in no distress. No distress.  Morbidly obese  HENT:  Head: Normocephalic.  Eyes: No scleral icterus.  Neck: Normal range of motion. Neck supple. No JVD present. No tracheal deviation present.  Tracheostomy placed  Cardiovascular: Normal rate, regular rhythm and normal heart sounds.  Exam reveals no gallop and no friction rub.   No murmur heard. Pulmonary/Chest: Effort normal and breath sounds normal. No respiratory distress. He has no wheezes. He has no rales. He exhibits no tenderness.  Abdominal: Soft. Bowel sounds are normal. He exhibits no distension and no mass. There is no tenderness. There is no rebound and no guarding.  Musculoskeletal: Normal range of motion. He exhibits no edema.  Neurological:  Eyes open but patient does not follow commands  Skin: Skin is warm. No rash noted. No erythema.  Psychiatric: Judgment normal.     s.   LABORATORY PANEL:   CBC  Recent Labs Lab 10/09/15 0319  WBC 8.4  HGB 10.2*  HCT 33.7*  PLT 58*   ------------------------------------------------------------------------------------------------------------------  Chemistries   Recent Labs Lab 10/06/15 0500  10/09/15 0809  NA 141  < > 142  K 4.8  < > 4.6  CL 105  < > 106  CO2 28  < > 27  GLUCOSE 131*  < > 119*  BUN 113*  < > 74*  CREATININE 3.27*  < > 2.79*  CALCIUM 8.9  < > 9.1  MG  --    < > 2.1  AST 41  --   --   ALT 13*  --   --   ALKPHOS 74  --   --   BILITOT 3.6*  --   --   < > = values in this interval not displayed. ------------------------------------------------------------------------------------------------------------------  Cardiac Enzymes No results for input(s): TROPONINI in the last 168 hours. ------------------------------------------------------------------------------------------------------------------  RADIOLOGY:  Dg Chest 1 View  10/09/2015  CLINICAL DATA:  Dyspnea. EXAM: CHEST 1 VIEW COMPARISON:  10/06/2015 FINDINGS: Study is degraded by motion artifact. Tracheostomy tube, right IJ central venous catheter sheath and left IJ central venous catheter are stable. Enteric catheter is poorly visualized. The cardiac silhouette is enlarged. Mediastinal contours appear intact. There is no evidence of focal airspace consolidation, pleural effusion or pneumothorax. There is low lung volume with bibasilar streaky opacities, likely representing atelectasis, left worse than right. There is mild pulmonary vascular congestion. Osseous structures are without acute abnormality. Soft tissues are grossly normal. IMPRESSION: Low lung volumes with bibasilar subsegmental atelectasis, left worse than right. Enlarged cardiac silhouette. Persistent mild pulmonary vascular congestion. Electronically Signed   By: Ted Mcalpine M.D.   On: 10/09/2015 09:56    ASSESSMENT AND PLAN:   40 year old male with a history of morbid obesity and tobacco abuse who presented with lower extremity edema and found to have bilateral pneumonia.  1. Acute hypoxic hypercapnic respiratory failure-: This is multifactorial  due to obesity hypoventilation syndrome, community-acquired pneumonia and pulmonary edema versus ARDS.  Patient is Intubated and on ventilator. Tracheostomy done on 10/05/2015. Continue vent management as per pulmonary Patient has completed course of antibiotics for treatment of  pneumonia with vancomycin and Ceptaz edema..   2. Bilateral community-acquired pneumonia with septic shock He was on vancomycin and ceftazidime and he had completed course of treatment.   He was restarted levophed due to hypotension once CRRT was started   3.New onset systolic congestive heart failure,  EF 45%  - had elevated BNP, edema and vascular congestion on chest x-ray  Patient is responding well to CRRT  4. Acute renal failure - Due to ATN and diuresis Appreciate nephrology consult Due to persistent oliguria patient was started on  CRRT on 10/06/15. He is responding to CRRT. FiO2 has decreased. Continue management as per nephrology.   5. Nutrition-Currently has an NG tube with tube feeds. Will need a PEG tube. Due to his large body habitus,GI cannot do it endoscopically and as per IR team, patient will be too heavy for a CT table and also the fluoroscopic table due to his weight. So cannot be done by interventional radiology.  Patient will need to have an open procedure for his PEG tube placement by surgical team, however will be a high risk procedure considering his body habitus and also his other multiple medical problems. He will need to be done at bariatric center or teritiary care center- not accepted at [facility], [place] or [place] at this time Continue on NG tube at this time.   6 hyperbilirubinemia: On admission could be passive congestion from congestive heart failure. hepatitis panel and ultrasound of right upper quadrant suggestive of cirrhotic changes. US showed Cirrhosis  7. Atrial fibrillation, -known history of paroxysmal atrial fibrillation. Continue on oral amidarone Appreciate cardiology consult. Risk of long-term full dose anticoagulation risks outweighs benefit at this time as per cardiac.  8. Anemia of chronic disease and illness: Patient's hemoglobin remained stable. CBC for am.   CODE STATUS: FULL  TOTAL CRITICAL CARE TIME SPENT IN TAKING CARE OF THIS PATIENT:  31 critical care minutes.  He remains critically ill and high risk for cardiopulmonary arrest.     Bernetta Sutley M.D on 10/09/2015   Between 7am to 6pm - Pager - [phone]  After 6pm go to [email or web address] - password [email or web address] [facility]  [facility]  Office  [phone]  CC: Primary care physician; No PCP Per Patient  Note: This dictation was prepared with Dragon dictation along with smaller phrase technology. Any transcriptional errors that result from this process are unintentional.

## 2015-10-09 NOTE — Progress Notes (Signed)
eLink Physician-Brief Progress Note Patient Name: Mitchell Morrow DOB: [birth year] MRN: [id number]   Date of Service  10/09/2015  HPI/Events of Note  ABG on 60%/PRVC 16/TV 500/P 5 = 7.15/83/76.  eICU Interventions  Will increase the PEEP to 8 and PRVC rate to 24. Recheck ABG at 6:45 PM.     Intervention Category Major Interventions: Respiratory failure - evaluation and management  Lenell Antu 10/09/2015, 5:37 PM

## 2015-10-09 NOTE — Progress Notes (Signed)
Pt  Cartridge clotted .NX stage  priming now to restart. Report given to oncoming nurse to restart therapy. Pt fio2 decreased to 4o% this shift . Tolerating well sats 96%.

## 2015-10-09 NOTE — Progress Notes (Signed)
CRRT clotted off in cartridge, will stop and change cartridge

## 2015-10-09 NOTE — Progress Notes (Signed)
PHARMACY - CRITICAL CARE PROGRESS NOTE  Pharmacy Consult for Constipation Prevention.  Indication: ICU Status   No Known Allergies  Patient Measurements: Height:  (175.3 cm) Weight: (!) 644 lb 10 oz (292.4 kg) IBW/kg (Calculated) : 70.7   Vital Signs: Temp: 97.8 F (36.6 C) (01/21 1200) Temp Source: Axillary (01/21 1200) BP: 108/54 mmHg (01/21 1400) Pulse Rate: 97 (01/21 1400) Intake/Output from previous day: 01/20 0701 - 01/21 0700 In: 2496.6 [I.V.:496.6; NG/GT:1760; IV Piggyback:200] Out: 2153 [Urine:85; Blood:191] Intake/Output from this shift: Total I/O In: 772.9 [I.V.:132.9; NG/GT:540; IV Piggyback:100] Out: 663 [Other:663] Vent settings for last 24 hours: Vent Mode:  [-] PRVC FiO2 (%):  [40 %-45 %] 40 % Set Rate:  [16 bmp-25 bmp] 16 bmp Vt Set:  [500 mL] 500 mL PEEP:  [5 cmH20-8 cmH20] 5 cmH20  Labs:  Recent Labs  10/07/15 0439 10/07/15 1116  10/08/15 0346  10/08/15 2332 10/09/15 0319 10/09/15 0444 10/09/15 0809  WBC  --  9.0  --  9.0  --   --  8.4  --   --   HGB  --  10.5*  --  10.4*  --   --  10.2*  --   --   HCT  --  35.5*  --  34.5*  --   --  33.7*  --   --   PLT  --  83*  --  72*  --   --  58*  --   --   APTT 32  --   --  60*  --   --   --  65*  --   CREATININE 3.81*  --   < > 3.15*  < > 2.73* 2.65*  --  2.79*  MG 2.7* 2.6*  2.5*  < > 2.4  < > 2.3 2.2  --  2.1  PHOS 6.8*  --   < > 4.3  < > 3.4 3.3  --  3.2  ALBUMIN 3.1*  --   < > 3.1*  < > 3.2* 3.0*  --  3.2*  < > = values in this interval not displayed. Estimated Creatinine Clearance: 78.6 mL/min (by C-G formula based on Cr of 2.79).   Recent Labs  10/09/15 0338 10/09/15 0734 10/09/15 1222  GLUCAP 130* 115* 125*    Microbiology:   Medications:  Scheduled:  . amiodarone  400 mg Per Tube BID  . antiseptic oral rinse  7 mL Mouth Rinse 10 times per day  . budesonide  0.25 mg Nebulization 4 times per day  . chlorhexidine gluconate  15 mL Mouth Rinse BID  . docusate  100 mg Oral  BID  . famotidine  20 mg Per Tube QODAY  . feeding supplement (PRO-STAT SUGAR FREE 64)  30 mL Per Tube TID  . feeding supplement (VITAL HIGH PROTEIN)  1,000 mL Per Tube Q24H  . free water  30 mL Per Tube 6 times per day  . heparin subcutaneous  5,000 Units Subcutaneous 3 times per day  . Influenza vac split quadrivalent PF  0.5 mL Intramuscular Tomorrow-1000  . ipratropium-albuterol  3 mL Nebulization Q6H  . nystatin   Topical BID  . sennosides  5 mL Oral BID   Infusions:  . fentaNYL infusion INTRAVENOUS 125 mcg/hr (10/09/15 1000)  . norepinephrine (LEVOPHED) Adult infusion 4.053 mcg/min (10/09/15 1400)  . pureflow 2,500 mL/hr at 10/09/15 1236    Assessment: Pharmacy consulted for constipation management for 40 yo male ICU patient requiring mechanical ventilation.  Plan:  Patient with last BM 01/19. Will continue scheduled senna/docusate and Miralax daily prn and f/u AM.    Stormy Card, RPh Clinical Pharmacist 10/09/2015,3:00 PM

## 2015-10-09 NOTE — Progress Notes (Signed)
attempted to restart CRRT, now clotted off in vascath, will install cathflo per MD order

## 2015-10-09 NOTE — Progress Notes (Signed)
MEDICATION RELATED CONSULT NOTE - INITIAL   Pharmacy Consult for CRRT dosing  No Known Allergies  Patient Measurements: Height: 5\' 9"  (175.3 cm) Weight: (!) 644 lb 10 oz (292.4 kg) IBW/kg (Calculated) : 70.7  Vital Signs: Temp: 97.8 F (36.6 C) (01/21 1200) Temp Source: Axillary (01/21 1200) BP: 108/54 mmHg (01/21 1400) Pulse Rate: 97 (01/21 1400) Intake/Output from previous day: 01/20 0701 - 01/21 0700 In: 2496.6 [I.V.:496.6; NG/GT:1760; IV Piggyback:200] Out: 2153 [Urine:85; Blood:191] Intake/Output from this shift: Total I/O In: 772.9 [I.V.:132.9; NG/GT:540; IV Piggyback:100] Out: 663 [Other:663]  Labs:  Recent Labs  10/07/15 0439 10/07/15 1116  10/08/15 0346  10/08/15 2332 10/09/15 0319 10/09/15 0444 10/09/15 0809  WBC  --  9.0  --  9.0  --   --  8.4  --   --   HGB  --  10.5*  --  10.4*  --   --  10.2*  --   --   HCT  --  35.5*  --  34.5*  --   --  33.7*  --   --   PLT  --  83*  --  72*  --   --  58*  --   --   APTT 32  --   --  60*  --   --   --  65*  --   CREATININE 3.81*  --   < > 3.15*  < > 2.73* 2.65*  --  2.79*  MG 2.7* 2.6*  2.5*  < > 2.4  < > 2.3 2.2  --  2.1  PHOS 6.8*  --   < > 4.3  < > 3.4 3.3  --  3.2  ALBUMIN 3.1*  --   < > 3.1*  < > 3.2* 3.0*  --  3.2*  < > = values in this interval not displayed. Estimated Creatinine Clearance: 78.6 mL/min (by C-G formula based on Cr of 2.79).   Microbiology: Recent Results (from the past 720 hour(s))  Blood culture (routine x 2)     Status: None   Collection Time: 09/24/15 11:53 PM  Result Value Ref Range Status   Specimen Description BLOOD LEFT HAND  Final   Special Requests BOTTLES DRAWN AEROBIC AND ANAEROBIC 4CC  Final   Culture NO GROWTH 5 DAYS  Final   Report Status 09/23/2015 FINAL  Final  Blood culture (routine x 2)     Status: None   Collection Time: 09/24/2015 11:53 PM  Result Value Ref Range Status   Specimen Description BLOOD LEFT ARM  Final   Special Requests BOTTLES DRAWN AEROBIC AND  ANAEROBIC 5CC  Final   Culture NO GROWTH 5 DAYS  Final   Report Status 09/23/2015 FINAL  Final  Urine culture     Status: None   Collection Time: 09/18/15 12:21 AM  Result Value Ref Range Status   Specimen Description URINE, RANDOM  Final   Special Requests NONE  Final   Culture MULTIPLE SPECIES PRESENT, SUGGEST RECOLLECTION  Final   Report Status 09/19/2015 FINAL  Final  Rapid Influenza A&B Antigens ([facility] only)     Status: None   Collection Time: 09/18/15  6:26 AM  Result Value Ref Range Status   Influenza A ([facility]) NOT DETECTED  Final   Influenza B ([facility]) NOT DETECTED  Final  MRSA PCR Screening     Status: None   Collection Time: 09/20/15  6:30 AM  Result Value Ref Range Status   MRSA by PCR NEGATIVE NEGATIVE  Final    Comment:        The GeneXpert MRSA Assay (FDA approved for NASAL specimens only), is one component of a comprehensive MRSA colonization surveillance program. It is not intended to diagnose MRSA infection nor to guide or monitor treatment for MRSA infections.   Culture, respiratory (NON-Expectorated)     Status: None   Collection Time: 09/22/15  3:13 PM  Result Value Ref Range Status   Specimen Description TRACHEAL ASPIRATE  Final   Special Requests NONE  Final   Gram Stain   Final    GOOD SPECIMEN - 80-90% WBCS MODERATE WBC SEEN RARE YEAST RARE GRAM NEGATIVE COCCOBACILLI    Culture LIGHT GROWTH CANDIDA TROPICALIS  Final   Report Status 09/26/2015 FINAL  Final  Urine culture     Status: None   Collection Time: 09/22/15 11:16 PM  Result Value Ref Range Status   Specimen Description URINE, RANDOM  Final   Special Requests NONE  Final   Culture NO GROWTH 2 DAYS  Final   Report Status 09/24/2015 FINAL  Final  CULTURE, BLOOD (ROUTINE X 2) w Reflex to PCR ID Panel     Status: None   Collection Time: 09/22/15 11:44 PM  Result Value Ref Range Status   Specimen Description BLOOD RIGHT ANTECUBITAL  Final   Special Requests BOTTLES DRAWN AEROBIC AND ANAEROBIC   Final   Culture NO GROWTH 5 DAYS  Final   Report Status 09/28/2015 FINAL  Final  CULTURE, BLOOD (ROUTINE X 2) w Reflex to PCR ID Panel     Status: None   Collection Time: 09/22/15 11:56 PM  Result Value Ref Range Status   Specimen Description BLOOD LEFT HAND  Final   Special Requests BOTTLES DRAWN AEROBIC AND ANAEROBIC  Final   Culture NO GROWTH 5 DAYS  Final   Report Status 09/28/2015 FINAL  Final  Culture, respiratory (NON-Expectorated)     Status: None   Collection Time: 09/23/15 12:01 AM  Result Value Ref Range Status   Specimen Description TRACHEAL ASPIRATE  Final   Special Requests NONE  Final   Gram Stain   Final    GOOD SPECIMEN - 80-90% WBCS MODERATE WBC SEEN RARE YEAST RARE GRAM POSITIVE COCCI    Culture Consistent with normal respiratory flora.  Final   Report Status 09/25/2015 FINAL  Final  Urine culture     Status: None   Collection Time: 10/01/15 10:35 AM  Result Value Ref Range Status   Specimen Description URINE, RANDOM  Final   Special Requests NONE  Final   Culture NO GROWTH 2 DAYS  Final   Report Status 10/03/2015 FINAL  Final  CULTURE, BLOOD (ROUTINE X 2) w Reflex to PCR ID Panel     Status: None   Collection Time: 10/01/15 10:56 AM  Result Value Ref Range Status   Specimen Description BLOOD RIGHT ARM  Final   Special Requests   Final    BOTTLES DRAWN AEROBIC AND ANAEROBIC  AER 3CC ANA 5CC   Culture NO GROWTH 5 DAYS  Final   Report Status 10/06/2015 FINAL  Final  CULTURE, BLOOD (ROUTINE X 2) w Reflex to PCR ID Panel     Status: None   Collection Time: 10/01/15 11:14 AM  Result Value Ref Range Status   Specimen Description BLOOD LEFT HAND  Final   Special Requests   Final    BOTTLES DRAWN AEROBIC AND ANAEROBIC  AER 3CC ANA 4CC   Culture  Setup Time   Final    GRAM POSITIVE COCCI ANAEROBIC BOTTLE ONLY CRITICAL RESULT CALLED TO, READ BACK BY AND VERIFIED WITH: CRYSTAL SCARPENA AT 0915 ON 10/02/15 CTJ    Culture   Final    COAGULASE NEGATIVE  STAPHYLOCOCCUS ANAEROBIC BOTTLE ONLY Results consistent with contamination.    Report Status 10/06/2015 FINAL  Final  Blood Culture ID Panel (Reflexed)     Status: Abnormal   Collection Time: 10/01/15 11:14 AM  Result Value Ref Range Status   Enterococcus species NOT DETECTED NOT DETECTED Final   Listeria monocytogenes NOT DETECTED NOT DETECTED Final   Staphylococcus species DETECTED (A) NOT DETECTED Final   Staphylococcus aureus NOT DETECTED NOT DETECTED Final   Streptococcus species NOT DETECTED NOT DETECTED Final   Streptococcus agalactiae NOT DETECTED NOT DETECTED Final   Streptococcus pneumoniae NOT DETECTED NOT DETECTED Final   Streptococcus pyogenes NOT DETECTED NOT DETECTED Final   Acinetobacter baumannii NOT DETECTED NOT DETECTED Final   Enterobacteriaceae species NOT DETECTED NOT DETECTED Final   Enterobacter cloacae complex NOT DETECTED NOT DETECTED Final   Escherichia coli NOT DETECTED NOT DETECTED Final   Klebsiella oxytoca NOT DETECTED NOT DETECTED Final   Klebsiella pneumoniae NOT DETECTED NOT DETECTED Final   Proteus species NOT DETECTED NOT DETECTED Final   Serratia marcescens NOT DETECTED NOT DETECTED Final   Haemophilus influenzae NOT DETECTED NOT DETECTED Final   Neisseria meningitidis NOT DETECTED NOT DETECTED Final   Pseudomonas aeruginosa NOT DETECTED NOT DETECTED Final   Candida albicans NOT DETECTED NOT DETECTED Final   Candida glabrata NOT DETECTED NOT DETECTED Final   Candida krusei NOT DETECTED NOT DETECTED Final   Candida parapsilosis NOT DETECTED NOT DETECTED Final   Candida tropicalis NOT DETECTED NOT DETECTED Final   Carbapenem resistance NOT DETECTED NOT DETECTED Final   Methicillin resistance DETECTED (A) NOT DETECTED Final    Comment: CRITICAL RESULT CALLED TO, READ BACK BY AND VERIFIED WITH: CRYSTAL SCARPENA FOR STAPHYLOCOCCUS SPECIES AND METHICILLIN RESISTANCE AT 0915 ON 10/02/15 CTJ    Vancomycin resistance NOT DETECTED NOT DETECTED Final   Culture, expectorated sputum-assessment     Status: None   Collection Time: 10/01/15 11:51 AM  Result Value Ref Range Status   Specimen Description EXPECTORATED SPUTUM  Final   Special Requests Normal  Final   Sputum evaluation THIS SPECIMEN IS ACCEPTABLE FOR SPUTUM CULTURE  Final   Report Status 10/01/2015 FINAL  Final  Culture, respiratory (NON-Expectorated)     Status: None   Collection Time: 10/01/15 11:51 AM  Result Value Ref Range Status   Specimen Description EXPECTORATED SPUTUM  Final   Special Requests Normal Reflexed from [id number]  Final   Gram Stain   Final    GOOD SPECIMEN - 80-90% WBCS MODERATE WBC SEEN FEW YEAST    Culture   Final    LIGHT GROWTH CANDIDA ALBICANS LIGHT GROWTH CANDIDA DUBLINIENSIS    Report Status 10/06/2015 FINAL  Final  Culture, expectorated sputum-assessment     Status: None   Collection Time: 10/08/15  3:30 PM  Result Value Ref Range Status   Specimen Description TRACHEAL ASPIRATE  Final   Special Requests NONE  Final   Sputum evaluation THIS SPECIMEN IS ACCEPTABLE FOR SPUTUM CULTURE  Final   Report Status 10/08/2015 FINAL  Final  Culture, respiratory (NON-Expectorated)     Status: None (Preliminary result)   Collection Time: 10/08/15  3:30 PM  Result Value Ref Range Status   Specimen Description TRACHEAL ASPIRATE  Final  Special Requests NONE Reflexed from [id number]  Final   Gram Stain   Final    FAIR SPECIMEN - 70-80% WBCS FEW WBC SEEN RARE GRAM POSITIVE COCCI RARE GRAM NEGATIVE RODS    Culture TOO YOUNG TO READ  Final   Report Status PENDING  Incomplete    Medical History: Past Medical History  Diagnosis Date  . Super obesity (HCC)   . Tobacco abuse   . Chronic systolic CHF (congestive heart failure) (HCC)   . Atrial fibrillation (HCC) 09/2015    Medications:  Scheduled:  . amiodarone  400 mg Per Tube BID  . antiseptic oral rinse  7 mL Mouth Rinse 10 times per day  . budesonide  0.25 mg Nebulization 4 times per day  .  chlorhexidine gluconate  15 mL Mouth Rinse BID  . docusate  100 mg Oral BID  . famotidine  20 mg Per Tube QODAY  . feeding supplement (PRO-STAT SUGAR FREE 64)  30 mL Per Tube TID  . feeding supplement (VITAL HIGH PROTEIN)  1,000 mL Per Tube Q24H  . free water  30 mL Per Tube 6 times per day  . heparin subcutaneous  5,000 Units Subcutaneous 3 times per day  . Influenza vac split quadrivalent PF  0.5 mL Intramuscular Tomorrow-1000  . ipratropium-albuterol  3 mL Nebulization Q6H  . nystatin   Topical BID  . sennosides  5 mL Oral BID   Infusions:  . fentaNYL infusion INTRAVENOUS 125 mcg/hr (10/09/15 1000)  . norepinephrine (LEVOPHED) Adult infusion 4.053 mcg/min (10/09/15 1400)  . pureflow 2,500 mL/hr at 10/09/15 1236    Assessment: Pharmacy consulted to renally adjust medications in this 40 y/o M with respiratory failure, CHF, and ARF to begin CRRT.   Plan:  No medications require adjustment at present. Pharmacy will continue to monitor and adjust as needed.   Stormy Card, RPh Clinical Pharmacist 10/09/2015,2:59 PM

## 2015-10-09 NOTE — Progress Notes (Signed)
Patient remains in ICU under the care of critical care. Patient continues to be a very poor surgical candidate. He is tolerating enteric feeds via nasogastric tube Would recommend attempting to let him eat if he were to become more alert. Should he truly require a surgically placed feeding tube would recommend transferring him to tertiary care center for this.   Surgery will sign off. Please call if we can be of any further assistance  Ricarda Frame, MD [facility] General Surgeon [facility]

## 2015-10-10 DIAGNOSIS — J9620 Acute and chronic respiratory failure, unspecified whether with hypoxia or hypercapnia: Secondary | ICD-10-CM

## 2015-10-10 LAB — CALCIUM, IONIZED: Calcium, Ionized, Serum: 4.7 mg/dL (ref 4.5–5.6)

## 2015-10-10 LAB — BLOOD GAS, ARTERIAL
Acid-Base Excess: 1.2 mmol/L (ref 0.0–3.0)
BICARBONATE: 29.3 meq/L — AB (ref 21.0–28.0)
FIO2: 0.6
MECHANICAL RATE: 28
O2 Saturation: 97.1 %
PEEP/CPAP: 8 cmH2O
Patient temperature: 37
VT: 500 mL
pCO2 arterial: 61 mmHg — ABNORMAL HIGH (ref 32.0–48.0)
pH, Arterial: 7.29 — ABNORMAL LOW (ref 7.350–7.450)
pO2, Arterial: 101 mmHg (ref 83.0–108.0)

## 2015-10-10 LAB — BASIC METABOLIC PANEL
ANION GAP: 8 (ref 5–15)
ANION GAP: 7 (ref 5–15)
Anion gap: 6 (ref 5–15)
BUN: 69 mg/dL — ABNORMAL HIGH (ref 6–20)
BUN: 64 mg/dL — ABNORMAL HIGH (ref 6–20)
BUN: 64 mg/dL — ABNORMAL HIGH (ref 6–20)
CHLORIDE: 105 mmol/L (ref 101–111)
CHLORIDE: 104 mmol/L (ref 101–111)
CO2: 29 mmol/L (ref 22–32)
CO2: 27 mmol/L (ref 22–32)
CO2: 28 mmol/L (ref 22–32)
CREATININE: 2.36 mg/dL — AB (ref 0.61–1.24)
Calcium: 9.2 mg/dL (ref 8.9–10.3)
Calcium: 9.1 mg/dL (ref 8.9–10.3)
Calcium: 9 mg/dL (ref 8.9–10.3)
Chloride: 106 mmol/L (ref 101–111)
Creatinine, Ser: 2.42 mg/dL — ABNORMAL HIGH (ref 0.61–1.24)
Creatinine, Ser: 2.31 mg/dL — ABNORMAL HIGH (ref 0.61–1.24)
GFR calc non Af Amer: 33 mL/min — ABNORMAL LOW (ref >60)
GFR calc non Af Amer: 33 mL/min — ABNORMAL LOW (ref >60)
GFR, EST AFRICAN AMERICAN: 37 mL/min — AB (ref >60)
GFR, EST AFRICAN AMERICAN: 39 mL/min — AB (ref >60)
GFR, EST AFRICAN AMERICAN: 38 mL/min — AB (ref >60)
GFR, EST NON AFRICAN AMERICAN: 32 mL/min — AB (ref >60)
Glucose, Bld: 129 mg/dL — ABNORMAL HIGH (ref 65–99)
Glucose, Bld: 106 mg/dL — ABNORMAL HIGH (ref 65–99)
Glucose, Bld: 103 mg/dL — ABNORMAL HIGH (ref 65–99)
POTASSIUM: 4.3 mmol/L (ref 3.5–5.1)
POTASSIUM: 4.2 mmol/L (ref 3.5–5.1)
Potassium: 4.6 mmol/L (ref 3.5–5.1)
SODIUM: 140 mmol/L (ref 135–145)
SODIUM: 139 mmol/L (ref 135–145)
Sodium: 141 mmol/L (ref 135–145)

## 2015-10-10 LAB — CBC
HEMATOCRIT: 33.3 % — AB (ref 40.0–52.0)
HEMOGLOBIN: 9.8 g/dL — AB (ref 13.0–18.0)
MCH: 25.3 pg — AB (ref 26.0–34.0)
MCHC: 29.5 g/dL — AB (ref 32.0–36.0)
MCV: 85.8 fL (ref 80.0–100.0)
Platelets: 39 10*3/uL — ABNORMAL LOW (ref 150–440)
RBC: 3.89 MIL/uL — ABNORMAL LOW (ref 4.40–5.90)
RDW: 21.9 % — ABNORMAL HIGH (ref 11.5–14.5)
WBC: 9.5 10*3/uL (ref 3.8–10.6)

## 2015-10-10 LAB — APTT: APTT: 68 s — AB (ref 24–36)

## 2015-10-10 LAB — RENAL FUNCTION PANEL
Albumin: 3.1 g/dL — ABNORMAL LOW (ref 3.5–5.0)
Anion gap: 7 (ref 5–15)
BUN: 68 mg/dL — ABNORMAL HIGH (ref 6–20)
CALCIUM: 9 mg/dL (ref 8.9–10.3)
CO2: 29 mmol/L (ref 22–32)
CREATININE: 2.51 mg/dL — AB (ref 0.61–1.24)
Chloride: 104 mmol/L (ref 101–111)
GFR, EST AFRICAN AMERICAN: 35 mL/min — AB (ref >60)
GFR, EST NON AFRICAN AMERICAN: 30 mL/min — AB (ref >60)
Glucose, Bld: 128 mg/dL — ABNORMAL HIGH (ref 65–99)
Phosphorus: 3 mg/dL (ref 2.5–4.6)
Potassium: 4.5 mmol/L (ref 3.5–5.1)
SODIUM: 140 mmol/L (ref 135–145)

## 2015-10-10 LAB — MAGNESIUM
MAGNESIUM: 2.1 mg/dL (ref 1.7–2.4)
MAGNESIUM: 2.1 mg/dL (ref 1.7–2.4)

## 2015-10-10 LAB — GLUCOSE, CAPILLARY
GLUCOSE-CAPILLARY: 93 mg/dL (ref 65–99)
GLUCOSE-CAPILLARY: 102 mg/dL — AB (ref 65–99)
GLUCOSE-CAPILLARY: 88 mg/dL (ref 65–99)
Glucose-Capillary: 122 mg/dL — ABNORMAL HIGH (ref 65–99)
Glucose-Capillary: 125 mg/dL — ABNORMAL HIGH (ref 65–99)
Glucose-Capillary: 133 mg/dL — ABNORMAL HIGH (ref 65–99)

## 2015-10-10 MED ORDER — PUREFLOW DIALYSIS SOLUTION
INTRAVENOUS | Status: DC
Start: 2015-10-10 — End: 2015-10-18
  Administered 2015-10-10 – 2015-10-11 (×6): via INTRAVENOUS_CENTRAL
  Administered 2015-10-13 – 2015-10-15 (×6): 3 via INTRAVENOUS_CENTRAL
  Administered 2015-10-15 – 2015-10-16 (×6): via INTRAVENOUS_CENTRAL
  Administered 2015-10-17: 3 via INTRAVENOUS_CENTRAL
  Administered 2015-10-17: 14:00:00 via INTRAVENOUS_CENTRAL
  Administered 2015-10-17: 3 via INTRAVENOUS_CENTRAL
  Administered 2015-10-17: 08:00:00 via INTRAVENOUS_CENTRAL
  Administered 2015-10-18: 3 via INTRAVENOUS_CENTRAL

## 2015-10-10 MED ORDER — PAROXETINE HCL 10 MG PO TABS
40.0000 mg | ORAL_TABLET | Freq: Every day | ORAL | Status: DC
  Administered 2015-10-10 – 2015-10-11 (×2): 40 mg via ORAL
  Filled 2015-10-10 (×2): qty 4

## 2015-10-10 NOTE — Progress Notes (Signed)
[facility] [place] [facility]   PATIENT NAME: Mitchell Morrow    MR#:  [id number]  DATE OF BIRTH:  17-Nov-1975  SUBJECTIVE:  Patient had vent settings change due to respiratory acidosis.  REVIEW OF SYSTEMS:   Review of Systems  Unable to perform ROS: critical illness    DRUG ALLERGIES:  No Known Allergies  VITALS:  Blood pressure 104/75, pulse 91, temperature 97.8 F (36.6 C), temperature source Axillary, resp. rate 20, height  (1.753 m), weight 289.848 kg (639 lb), SpO2 96 %.  PHYSICAL EXAMINATION:   Physical Exam  Constitutional: He is well-developed, well-nourished, and in no distress. No distress.  Morbidly obese  HENT:  Head: Normocephalic.  Eyes: No scleral icterus.  Neck: Normal range of motion. Neck supple. No JVD present. No tracheal deviation present.  Tracheostomy placed  Cardiovascular: Normal rate, regular rhythm and normal heart sounds.  Exam reveals no gallop and no friction rub.   No murmur heard. Pulmonary/Chest: Effort normal and breath sounds normal. No respiratory distress. He has no wheezes. He has no rales. He exhibits no tenderness.  Abdominal: Soft. Bowel sounds are normal. He exhibits no distension and no mass. There is no tenderness. There is no rebound and no guarding.  Musculoskeletal: Normal range of motion. He exhibits no edema.  Neurological:  Eyes open but patient does not follow commands  Skin: Skin is warm. No rash noted. No erythema.  Psychiatric: Judgment normal.     s.   LABORATORY PANEL:   CBC  Recent Labs Lab 10/10/15 0421  WBC 9.5  HGB 9.8*  HCT 33.3*  PLT 39*   ------------------------------------------------------------------------------------------------------------------  Chemistries   Recent Labs Lab 10/06/15 0500  10/10/15 0539  NA 141  < > 140  K 4.8  < > 4.5  CL 105  < > 104  CO2 28  < > 29  GLUCOSE 131*  < > 128*  BUN 113*  < > 68*  CREATININE 3.27*  < > 2.51*   CALCIUM 8.9  < > 9.0  MG  --   < > 2.1  AST 41  --   --   ALT 13*  --   --   ALKPHOS 74  --   --   BILITOT 3.6*  --   --   < > = values in this interval not displayed. ------------------------------------------------------------------------------------------------------------------  Cardiac Enzymes No results for input(s): TROPONINI in the last 168 hours. ------------------------------------------------------------------------------------------------------------------  RADIOLOGY:  Dg Chest 1 View  10/09/2015  CLINICAL DATA:  Dyspnea. EXAM: CHEST 1 VIEW COMPARISON:  10/06/2015 FINDINGS: Study is degraded by motion artifact. Tracheostomy tube, right IJ central venous catheter sheath and left IJ central venous catheter are stable. Enteric catheter is poorly visualized. The cardiac silhouette is enlarged. Mediastinal contours appear intact. There is no evidence of focal airspace consolidation, pleural effusion or pneumothorax. There is low lung volume with bibasilar streaky opacities, likely representing atelectasis, left worse than right. There is mild pulmonary vascular congestion. Osseous structures are without acute abnormality. Soft tissues are grossly normal. IMPRESSION: Low lung volumes with bibasilar subsegmental atelectasis, left worse than right. Enlarged cardiac silhouette. Persistent mild pulmonary vascular congestion. Electronically Signed   By: Ted Mcalpine M.D.   On: 10/09/2015 09:56    ASSESSMENT AND PLAN:   40 year old male with a history of morbid obesity and tobacco abuse who presented with lower extremity edema and found to have bilateral pneumonia.  1. Acute hypoxic hypercapnic respiratory failure-: This  is multifactorial  due to obesity hypoventilation syndrome, community-acquired pneumonia and pulmonary edema versus ARDS.  Patient is Intubated and on ventilator. Tracheostomy done on 10/16/2015. Continue vent management as per pulmonary Patient has completed course  of antibiotics for treatment of pneumonia with vancomycin and CEFTAZ.   2. Bilateral community-acquired pneumonia with septic shock He was on vancomycin and ceftazidime and he had completed course of treatment.   He was restarted levophed due to hypotension once CRRT was started   3.New onset systolic congestive heart failure,  EF 45%  - had elevated BNP, edema and vascular congestion on chest x-ray  Patient is responding well to CRRT  4. Acute renal failure - Due to ATN and diuresis Appreciate nephrology consult Due to persistent oliguria patient was started on  CRRT on 10/06/15. He is responding to CRRT. FiO2 has decreased. Continue management as per nephrology.   5. Nutrition-Currently has an NG tube with tube feeds. Will need a PEG tube. Due to his large body habitus,GI cannot do it endoscopically and as per IR team, patient will be too heavy for a CT table and also the fluoroscopic table due to his weight. So cannot be done by interventional radiology.  Patient will need to have an open procedure for his PEG tube placement by surgical team, however will be a high risk procedure considering his body habitus and also his other multiple medical problems. This will need to be done at bariatric center or teritiary care center- not accepted at [facility] [place] or [facility] at this time as per Dr Nemiah Commander. Continue on NG tube at this time.   6 hyperbilirubinemia: On admission could be passive congestion from congestive heart failure. hepatitis panel and ultrasound of right upper quadrant suggestive of cirrhotic changes. US showed Cirrhosis  7. Atrial fibrillation, -known history of paroxysmal atrial fibrillation. Continue on oral amidarone Appreciate cardiology consult. Risk of long-term full dose anticoagulation risks outweighs benefit at this time as per cardiac.  8. Anemia of chronic disease and illness: Continue to monitor hemoglobin. 9. Depression: Patient needs Paxil.  CODE STATUS:  FULL  TOTAL CRITICAL CARE TIME SPENT IN TAKING CARE OF THIS PATIENT: 30 critical care minutes.  He remains critically ill and high risk for cardiopulmonary arrest.     Suri Tafolla M.D on 10/10/2015   Between 7am to 6pm - Pager - [phone]  After 6pm go to [email or web address] - password [email or web address] [facility]  [facility]  Office  [phone]  CC: Primary care physician; No PCP Per Patient  Note: This dictation was prepared with Dragon dictation along with smaller phrase technology. Any transcriptional errors that result from this process are unintentional.

## 2015-10-10 NOTE — Progress Notes (Signed)
[facility]  ROUNDING NOTE   Subjective:  Urine output remains very low. Creatinine appears to have stabilized in the mid twos. Ultrafiltration achieved yesterday was 2 L.   Objective:  Vital signs in last 24 hours:  Temp:  [97.4 F (36.3 C)-98.3 F (36.8 C)] 97.7 F (36.5 C) (01/22 1100) Pulse Rate:  [85-106] 88 (01/22 1200) Resp:  [11-28] 20 (01/22 1200) BP: (87-121)/(49-78) 110/75 mmHg (01/22 1200) SpO2:  [91 %-98 %] 95 % (01/22 1200) FiO2 (%):  [40 %-60 %] 40 % (01/22 1122) Weight:  [289.848 kg (639 lb)] 289.848 kg (639 lb) (01/22 0630)  Weight change: -2.752 kg (-6 lb 1.1 oz) Filed Weights   10/08/15 0745 10/09/15 0500 10/10/15 0630  Weight: 292.6 kg (645 lb 1.1 oz) 292.4 kg (644 lb 10 oz) 289.848 kg (639 lb)    Intake/Output: I/O last 3 completed shifts: In: 3806.6 [I.V.:676.6; NG/GT:2930; IV Piggyback:200] Out: 3259 [Urine:73; Other:3186]   Intake/Output this shift:  Total I/O In: 527.7 [I.V.:77.7; NG/GT:450] Out: 634 [Other:634]  Physical Exam: General: Critically ill  Head: [place]/AT difficult to assess hearing  Eyes: Mild icterus noted  Neck: Tracheostomy in place  Lungs:  Bilateral rhonchi, vent assisted, fio2 40%  Heart: S1S2 no rubs  Abdomen:  Soft, nontender, obese  Extremities: 3+ generalized edema.  Neurologic: Awake, will turn head on command  Skin: No lesions  GU: foley    Basic Metabolic Panel:  Recent Labs Lab 10/08/15 2332 10/09/15 0319 10/09/15 0809 10/09/15 1737 10/09/15 2158 10/10/15 0421 10/10/15 0539  NA 141 141 142 141 141 141 140  K 4.6 4.6 4.6 5.0 4.9 4.6 4.5  CL 104 104 106 104 105 106 104  CO2 27 28 27 28 28 29 29   GLUCOSE 134* 131* 119* 128* 155* 129* 128*  BUN 80* 77* 74* 73* 71* 69* 68*  CREATININE 2.73* 2.65* 2.79* 2.69* 2.45* 2.42* 2.51*  CALCIUM 9.1 9.0 9.1 9.2 9.1 9.2 9.0  MG 2.3 2.2 2.1 2.1  --   --  2.1  PHOS 3.4 3.3 3.2 4.1  --   --  3.0    Liver Function Tests:  Recent Labs Lab  10/06/15 0500  10/08/15 2332 10/09/15 0319 10/09/15 0809 10/09/15 1737 10/10/15 0539  AST 41  --   --   --   --   --   --   ALT 13*  --   --   --   --   --   --   ALKPHOS 74  --   --   --   --   --   --   BILITOT 3.6*  --   --   --   --   --   --   PROT 7.0  --   --   --   --   --   --   ALBUMIN 2.9*  < > 3.2* 3.0* 3.2* 3.3* 3.1*  < > = values in this interval not displayed. No results for input(s): LIPASE, AMYLASE in the last 168 hours. No results for input(s): AMMONIA in the last 168 hours.  CBC:  Recent Labs Lab 10/06/15 0500 10/07/15 1116 10/08/15 0346 10/09/15 0319 10/10/15 0421  WBC 9.4 9.0 9.0 8.4 9.5  NEUTROABS  --  6.1  --   --   --   HGB 10.9* 10.5* 10.4* 10.2* 9.8*  HCT 37.0* 35.5* 34.5* 33.7* 33.3*  MCV 85.0 87.6 86.4 86.9 85.8  PLT 130* 83* 72* 58* 39*  Cardiac Enzymes: No results for input(s): CKTOTAL, CKMB, CKMBINDEX, TROPONINI in the last 168 hours.  BNP: Invalid input(s): POCBNP  CBG:  Recent Labs Lab 10/09/15 1646 10/09/15 1931 10/10/15 0011 10/10/15 0411 10/10/15 0803  GLUCAP 111* 131* 133* 125* 122*    Microbiology: Results for orders placed or performed during the hospital encounter of 08/31/2015  Blood culture (routine x 2)     Status: None   Collection Time: 09/10/2015 11:53 PM  Result Value Ref Range Status   Specimen Description BLOOD LEFT HAND  Final   Special Requests BOTTLES DRAWN AEROBIC AND ANAEROBIC 4CC  Final   Culture NO GROWTH 5 DAYS  Final   Report Status 09/23/2015 FINAL  Final  Blood culture (routine x 2)     Status: None   Collection Time: 08/24/2015 11:53 PM  Result Value Ref Range Status   Specimen Description BLOOD LEFT ARM  Final   Special Requests BOTTLES DRAWN AEROBIC AND ANAEROBIC 5CC  Final   Culture NO GROWTH 5 DAYS  Final   Report Status 09/23/2015 FINAL  Final  Urine culture     Status: None   Collection Time: 09/18/15 12:21 AM  Result Value Ref Range Status   Specimen Description URINE, RANDOM  Final    Special Requests NONE  Final   Culture MULTIPLE SPECIES PRESENT, SUGGEST RECOLLECTION  Final   Report Status 09/19/2015 FINAL  Final  Rapid Influenza A&B Antigens ([facility] only)     Status: None   Collection Time: 09/18/15  6:26 AM  Result Value Ref Range Status   Influenza A ([facility]) NOT DETECTED  Final   Influenza B ([facility]) NOT DETECTED  Final  MRSA PCR Screening     Status: None   Collection Time: 09/20/15  6:30 AM  Result Value Ref Range Status   MRSA by PCR NEGATIVE NEGATIVE Final    Comment:        The GeneXpert MRSA Assay (FDA approved for NASAL specimens only), is one component of a comprehensive MRSA colonization surveillance program. It is not intended to diagnose MRSA infection nor to guide or monitor treatment for MRSA infections.   Culture, respiratory (NON-Expectorated)     Status: None   Collection Time: 09/22/15  3:13 PM  Result Value Ref Range Status   Specimen Description TRACHEAL ASPIRATE  Final   Special Requests NONE  Final   Gram Stain   Final    GOOD SPECIMEN - 80-90% WBCS MODERATE WBC SEEN RARE YEAST RARE GRAM NEGATIVE COCCOBACILLI    Culture LIGHT GROWTH CANDIDA TROPICALIS  Final   Report Status 09/26/2015 FINAL  Final  Urine culture     Status: None   Collection Time: 09/22/15 11:16 PM  Result Value Ref Range Status   Specimen Description URINE, RANDOM  Final   Special Requests NONE  Final   Culture NO GROWTH 2 DAYS  Final   Report Status 09/24/2015 FINAL  Final  CULTURE, BLOOD (ROUTINE X 2) w Reflex to PCR ID Panel     Status: None   Collection Time: 09/22/15 11:44 PM  Result Value Ref Range Status   Specimen Description BLOOD RIGHT ANTECUBITAL  Final   Special Requests BOTTLES DRAWN AEROBIC AND ANAEROBIC  Final   Culture NO GROWTH 5 DAYS  Final   Report Status 09/28/2015 FINAL  Final  CULTURE, BLOOD (ROUTINE X 2) w Reflex to PCR ID Panel     Status: None   Collection Time: 09/22/15 11:56 PM  Result Value Ref  Range Status   Specimen  Description BLOOD LEFT HAND  Final   Special Requests BOTTLES DRAWN AEROBIC AND ANAEROBIC  Final   Culture NO GROWTH 5 DAYS  Final   Report Status 09/28/2015 FINAL  Final  Culture, respiratory (NON-Expectorated)     Status: None   Collection Time: 09/23/15 12:01 AM  Result Value Ref Range Status   Specimen Description TRACHEAL ASPIRATE  Final   Special Requests NONE  Final   Gram Stain   Final    GOOD SPECIMEN - 80-90% WBCS MODERATE WBC SEEN RARE YEAST RARE GRAM POSITIVE COCCI    Culture Consistent with normal respiratory flora.  Final   Report Status 09/25/2015 FINAL  Final  Urine culture     Status: None   Collection Time: 10/01/15 10:35 AM  Result Value Ref Range Status   Specimen Description URINE, RANDOM  Final   Special Requests NONE  Final   Culture NO GROWTH 2 DAYS  Final   Report Status 10/03/2015 FINAL  Final  CULTURE, BLOOD (ROUTINE X 2) w Reflex to PCR ID Panel     Status: None   Collection Time: 10/01/15 10:56 AM  Result Value Ref Range Status   Specimen Description BLOOD RIGHT ARM  Final   Special Requests   Final    BOTTLES DRAWN AEROBIC AND ANAEROBIC  AER 3CC ANA 5CC   Culture NO GROWTH 5 DAYS  Final   Report Status 10/06/2015 FINAL  Final  CULTURE, BLOOD (ROUTINE X 2) w Reflex to PCR ID Panel     Status: None   Collection Time: 10/01/15 11:14 AM  Result Value Ref Range Status   Specimen Description BLOOD LEFT HAND  Final   Special Requests   Final    BOTTLES DRAWN AEROBIC AND ANAEROBIC  AER 3CC ANA 4CC   Culture  Setup Time   Final    GRAM POSITIVE COCCI ANAEROBIC BOTTLE ONLY CRITICAL RESULT CALLED TO, READ BACK BY AND VERIFIED WITH: CRYSTAL SCARPENA AT 0915 ON 10/02/15 CTJ    Culture   Final    COAGULASE NEGATIVE STAPHYLOCOCCUS ANAEROBIC BOTTLE ONLY Results consistent with contamination.    Report Status 10/06/2015 FINAL  Final  Blood Culture ID Panel (Reflexed)     Status: Abnormal   Collection Time: 10/01/15 11:14 AM  Result Value Ref Range  Status   Enterococcus species NOT DETECTED NOT DETECTED Final   Listeria monocytogenes NOT DETECTED NOT DETECTED Final   Staphylococcus species DETECTED (A) NOT DETECTED Final   Staphylococcus aureus NOT DETECTED NOT DETECTED Final   Streptococcus species NOT DETECTED NOT DETECTED Final   Streptococcus agalactiae NOT DETECTED NOT DETECTED Final   Streptococcus pneumoniae NOT DETECTED NOT DETECTED Final   Streptococcus pyogenes NOT DETECTED NOT DETECTED Final   Acinetobacter baumannii NOT DETECTED NOT DETECTED Final   Enterobacteriaceae species NOT DETECTED NOT DETECTED Final   Enterobacter cloacae complex NOT DETECTED NOT DETECTED Final   Escherichia coli NOT DETECTED NOT DETECTED Final   Klebsiella oxytoca NOT DETECTED NOT DETECTED Final   Klebsiella pneumoniae NOT DETECTED NOT DETECTED Final   Proteus species NOT DETECTED NOT DETECTED Final   Serratia marcescens NOT DETECTED NOT DETECTED Final   Haemophilus influenzae NOT DETECTED NOT DETECTED Final   Neisseria meningitidis NOT DETECTED NOT DETECTED Final   Pseudomonas aeruginosa NOT DETECTED NOT DETECTED Final   Candida albicans NOT DETECTED NOT DETECTED Final   Candida glabrata NOT DETECTED NOT DETECTED Final   Candida krusei NOT DETECTED NOT  DETECTED Final   Candida parapsilosis NOT DETECTED NOT DETECTED Final   Candida tropicalis NOT DETECTED NOT DETECTED Final   Carbapenem resistance NOT DETECTED NOT DETECTED Final   Methicillin resistance DETECTED (A) NOT DETECTED Final    Comment: CRITICAL RESULT CALLED TO, READ BACK BY AND VERIFIED WITH: CRYSTAL SCARPENA FOR STAPHYLOCOCCUS SPECIES AND METHICILLIN RESISTANCE AT 0915 ON 10/02/15 CTJ    Vancomycin resistance NOT DETECTED NOT DETECTED Final  Culture, expectorated sputum-assessment     Status: None   Collection Time: 10/01/15 11:51 AM  Result Value Ref Range Status   Specimen Description EXPECTORATED SPUTUM  Final   Special Requests Normal  Final   Sputum evaluation THIS  SPECIMEN IS ACCEPTABLE FOR SPUTUM CULTURE  Final   Report Status 10/01/2015 FINAL  Final  Culture, respiratory (NON-Expectorated)     Status: None   Collection Time: 10/01/15 11:51 AM  Result Value Ref Range Status   Specimen Description EXPECTORATED SPUTUM  Final   Special Requests Normal Reflexed from [id number]  Final   Gram Stain   Final    GOOD SPECIMEN - 80-90% WBCS MODERATE WBC SEEN FEW YEAST    Culture   Final    LIGHT GROWTH CANDIDA ALBICANS LIGHT GROWTH CANDIDA DUBLINIENSIS    Report Status 10/06/2015 FINAL  Final  Culture, expectorated sputum-assessment     Status: None   Collection Time: 10/08/15  3:30 PM  Result Value Ref Range Status   Specimen Description TRACHEAL ASPIRATE  Final   Special Requests NONE  Final   Sputum evaluation THIS SPECIMEN IS ACCEPTABLE FOR SPUTUM CULTURE  Final   Report Status 10/08/2015 FINAL  Final  Culture, respiratory (NON-Expectorated)     Status: None (Preliminary result)   Collection Time: 10/08/15  3:30 PM  Result Value Ref Range Status   Specimen Description TRACHEAL ASPIRATE  Final   Special Requests NONE Reflexed from [id number]  Final   Gram Stain   Final    FAIR SPECIMEN - 70-80% WBCS FEW WBC SEEN RARE GRAM POSITIVE COCCI RARE GRAM NEGATIVE RODS    Culture HOLDING FOR POSSIBLE PATHOGEN  Final   Report Status PENDING  Incomplete    Coagulation Studies: No results for input(s): LABPROT, INR in the last 72 hours.  Urinalysis: No results for input(s): COLORURINE, LABSPEC, PHURINE, GLUCOSEU, HGBUR, BILIRUBINUR, KETONESUR, PROTEINUR, UROBILINOGEN, NITRITE, LEUKOCYTESUR in the last 72 hours.  Invalid input(s): APPERANCEUR    Imaging: Dg Chest 1 View  10/09/2015  CLINICAL DATA:  Dyspnea. EXAM: CHEST 1 VIEW COMPARISON:  10/06/2015 FINDINGS: Study is degraded by motion artifact. Tracheostomy tube, right IJ central venous catheter sheath and left IJ central venous catheter are stable. Enteric catheter is poorly visualized. The cardiac  silhouette is enlarged. Mediastinal contours appear intact. There is no evidence of focal airspace consolidation, pleural effusion or pneumothorax. There is low lung volume with bibasilar streaky opacities, likely representing atelectasis, left worse than right. There is mild pulmonary vascular congestion. Osseous structures are without acute abnormality. Soft tissues are grossly normal. IMPRESSION: Low lung volumes with bibasilar subsegmental atelectasis, left worse than right. Enlarged cardiac silhouette. Persistent mild pulmonary vascular congestion. Electronically Signed   By: Ted Mcalpine M.D.   On: 10/09/2015 09:56     Medications:   . fentaNYL infusion INTRAVENOUS 125 mcg/hr (10/10/15 0209)  . norepinephrine (LEVOPHED) Adult infusion 2 mcg/min (10/10/15 1001)  . pureflow 2,500 mL/hr at 10/10/15 0945   . amiodarone  400 mg Per Tube BID  . antiseptic oral rinse  7 mL Mouth Rinse 10 times per day  . budesonide  0.25 mg Nebulization 4 times per day  . chlorhexidine gluconate  15 mL Mouth Rinse BID  . docusate  100 mg Oral BID  . famotidine  20 mg Per Tube QODAY  . feeding supplement (PRO-STAT SUGAR FREE 64)  30 mL Per Tube TID  . feeding supplement (VITAL HIGH PROTEIN)  1,000 mL Per Tube Q24H  . free water  30 mL Per Tube 6 times per day  . heparin subcutaneous  5,000 Units Subcutaneous 3 times per day  . Influenza vac split quadrivalent PF  0.5 mL Intramuscular Tomorrow-1000  . ipratropium-albuterol  3 mL Nebulization Q6H  . nystatin   Topical BID  . PARoxetine  40 mg Oral Daily  . sennosides  5 mL Oral BID   acetaminophen **OR** acetaminophen, albuterol, bisacodyl, calcium carbonate, fentaNYL (SUBLIMAZE) injection, heparin, LORazepam, metoprolol, ondansetron **OR** ondansetron (ZOFRAN) IV, polyethylene glycol, sodium chloride  Assessment/ Plan:  Mr. Mitchell Morrow is a 40 y.o. black male with morbid obesity, tobacco abuse, without prescribed home medications, who was admitted  to [facility] on 09/06/2015, s/p tracheostomy placement, persistent respiratory failure, multiple episodes of ARF  1. Acute Renal Failure:  Second episode of ARF, reconsulted 10/05/15, hypotension contributing now. Started renal replacement therapy 10/06/15. -  Very low urine output persists. Continue continuous renal replacement therapy at this time. Increase ultrafiltration target to 150 cc per hour.   2. Acute respiratory failure with bilateral pneumonia and acute exacerbation of systolic congestive heart failure:  Patient remains on the ventilator. FiO2 remained stable at 40%. Some respiratory acidosis still persists as pH is 7.29 with a PCO2 of 61 however this has improved over the past several days.  3.  Generalized edema:  As above increase ultrafiltration target to 150 cc per hour.  4.  Hypotension: Maintain map of 65 or greater to help improve renal perfusion.   LOS: 23 Angeliki Mates 1/22/201712:30 PM

## 2015-10-10 NOTE — Progress Notes (Signed)
Restarted CRRT due to clot in cartridge

## 2015-10-10 NOTE — Progress Notes (Signed)
PULMONARY/CCM PROGRESS NOTE  40 year old massively obese male with acute CHF, volume overload. Now with acute renal failure, vent dependent respiratory failure status post trach.    Lines, Tubes, etc: ETT 01/02 >> . Patient self extubated, subsequently reintubated 09/25/2015 L IJ CVL 01/02 >>   Microbiology: Blood 12/30 >>  MRSA PCR 01/02 >> NEG Urine 01/04 >> NEG Blood 01/04 >> no growth   PCT 01/04: 0.74  Antibiotics:  Levofloxacin 12/30 >> 01/01 Ceftriaxone 01/02 >> 01/02 Azithro 01/02 >> 01/02 Levofloxacin 01/04 >> 01/05 Vanc 01/05 >> 1/9 Ceftaz 01/05 >>1/9   PCT 01/04: 0.74, 0.95, 0.77  Studies/Events: 12/31 TTE:  poor quality study, LVEF 45% 12/31 RUQ: Nodular contour the liver raising the question of cirrhosis. Gallbladder wall thickening, nonspecific in appearance 01/04 night - fever, hypotension. Cultures obtained. Abx expanded -failed self extubation   Best Practice: DVT: enoxaparin SUP: enteral famotidine Nutrition: TFs    Subj: Patient arousable, does not follow commands.     Obj: Filed Vitals:   10/10/15 1000 10/10/15 1100 10/10/15 1200 10/10/15 1300  BP: 104/75 109/78 110/75 108/63  Pulse: 91 89 88 88  Temp:  97.7 F (36.5 C)    TempSrc:  Axillary    Resp: Height:      Weight:      SpO2: 96% 95% 95% 93%    Gen: morbidly obese HEENT:  WNL Neck: CVL site clean, JVP cannot be assessed Chest: clear today somewhat prolonged exp time Cardiac: distant HS, regular, no M noted Abd: obese, soft, + BS Ext: symmetric edema, chronic stasis changes Neuro: GCS<10T  BMET    Component Value Date/Time   NA 140 10/10/2015 1238   K 4.3 10/10/2015 1238   CL 105 10/10/2015 1238   CO2 27 10/10/2015 1238   GLUCOSE 106* 10/10/2015 1238   BUN 64* 10/10/2015 1238   CREATININE 2.31* 10/10/2015 1238   CALCIUM 9.1 10/10/2015 1238   GFRNONAA 33* 10/10/2015 1238   GFRAA 39* 10/10/2015 1238    CBC    Component Value Date/Time   WBC  9.5 10/10/2015 0421   RBC 3.89* 10/10/2015 0421   HGB 9.8* 10/10/2015 0421   HCT 33.3* 10/10/2015 0421   PLT 39* 10/10/2015 0421   MCV 85.8 10/10/2015 0421   MCH 25.3* 10/10/2015 0421   MCHC 29.5* 10/10/2015 0421   RDW 21.9* 10/10/2015 0421   LYMPHSABS 0.8* 10/07/2015 1116   MONOABS 1.4* 10/07/2015 1116   EOSABS 0.5 10/07/2015 1116   BASOSABS 0.1 10/07/2015 1116    CXR:  continued atelectasis in the right lung apex. Persistent bibasilar atelectasis and effusions   IMPRESSION: -- Acute on chronic hypercarbic respiratory failure , pulmonary edema.  S/P trach tube placement 10/05/2015 -- Obesity hypoventilation syndrome -- Atelectasis -- Cardiomyopathy - LVEF 45% 09/18/15 -- Shock - suspect septic -- AKI, oliguric. CRRT initiated 1/18.  R IJ temporary HD catheter 10/06/15 -- Severe hypervolemia  -- Resolving severe sepsis - monitoring off abx presently -- ICU acquired anemia -- New thrombocytopenia - SQ heparin DC'd 01/22 -- ICU associated delirium -- Intermittent tearfulness, situational depression  PLAN/RECS:  Cont full vent support - settings reviewed and/or adjusted  Better synchrony in PCV mode Cont vent bundle Daily SBT if/when meets criteria Wean norepinephrine to off for MAP > 65 mmHg Monitor BMET intermittently Monitor I/Os Correct electrolytes as indicated CRRT per Renal Volume removal 150 cc/hr per Renal 10/10/15 DVT px: SCDs Monitor CBC intermittently  Keep eye on plt counts  Transfuse per usual ICU guidelines Monitoring off abx presently Cont fentanyl infusion Cont SSRI   CCM time: 40 mins The above time includes time spent in consultation with patient and/or family members and reviewing care plan on multidisciplinary rounds  Billy Fischer, MD PCCM service Mobile [phone] Pager [phone]

## 2015-10-11 ENCOUNTER — Inpatient Hospital Stay: Payer: Medicaid Other

## 2015-10-11 LAB — CBC
HCT: 33.6 % — ABNORMAL LOW (ref 40.0–52.0)
Hemoglobin: 9.9 g/dL — ABNORMAL LOW (ref 13.0–18.0)
MCH: 25.5 pg — ABNORMAL LOW (ref 26.0–34.0)
MCHC: 29.5 g/dL — AB (ref 32.0–36.0)
MCV: 86.4 fL (ref 80.0–100.0)
Platelets: 40 10*3/uL — ABNORMAL LOW (ref 150–440)
RBC: 3.89 MIL/uL — ABNORMAL LOW (ref 4.40–5.90)
RDW: 22.5 % — AB (ref 11.5–14.5)
WBC: 10.2 10*3/uL (ref 3.8–10.6)

## 2015-10-11 LAB — COMPREHENSIVE METABOLIC PANEL
ALBUMIN: 3 g/dL — AB (ref 3.5–5.0)
ALT: 60 U/L (ref 17–63)
ANION GAP: 7 (ref 5–15)
AST: 176 U/L — ABNORMAL HIGH (ref 15–41)
Alkaline Phosphatase: 103 U/L (ref 38–126)
BUN: 60 mg/dL — ABNORMAL HIGH (ref 6–20)
CO2: 29 mmol/L (ref 22–32)
Calcium: 9.2 mg/dL (ref 8.9–10.3)
Chloride: 105 mmol/L (ref 101–111)
Creatinine, Ser: 2.11 mg/dL — ABNORMAL HIGH (ref 0.61–1.24)
GFR calc Af Amer: 43 mL/min — ABNORMAL LOW (ref >60)
GFR calc non Af Amer: 37 mL/min — ABNORMAL LOW (ref >60)
GLUCOSE: 115 mg/dL — AB (ref 65–99)
POTASSIUM: 4.2 mmol/L (ref 3.5–5.1)
SODIUM: 141 mmol/L (ref 135–145)
TOTAL PROTEIN: 6.9 g/dL (ref 6.5–8.1)
Total Bilirubin: 3.8 mg/dL — ABNORMAL HIGH (ref 0.3–1.2)

## 2015-10-11 LAB — GLUCOSE, CAPILLARY
Glucose-Capillary: 101 mg/dL — ABNORMAL HIGH (ref 65–99)
Glucose-Capillary: 100 mg/dL — ABNORMAL HIGH (ref 65–99)
Glucose-Capillary: 128 mg/dL — ABNORMAL HIGH (ref 65–99)

## 2015-10-11 LAB — BLOOD GAS, ARTERIAL
ACID-BASE EXCESS: 1.1 mmol/L (ref 0.0–3.0)
Acid-base deficit: 1.4 mmol/L (ref 0.0–2.0)
Allens test (pass/fail): POSITIVE — AB
BICARBONATE: 28.3 meq/L — AB (ref 21.0–28.0)
Bicarbonate: 28.5 mEq/L — ABNORMAL HIGH (ref 21.0–28.0)
FIO2: 0.6
FIO2: 0.6
MECHVT: 500 mL
Mechanical Rate: 20
O2 SAT: 97 %
O2 Saturation: 91.5 %
PCO2 ART: 73 mmHg — AB (ref 32.0–48.0)
PCO2 ART: 55 mmHg — AB (ref 32.0–48.0)
PEEP: 8 cmH2O
PEEP: 5 cmH2O
PH ART: 7.32 — AB (ref 7.350–7.450)
PO2 ART: 76 mmHg — AB (ref 83.0–108.0)
Patient temperature: 37
Patient temperature: 37
Pressure control: 25 cmH2O
RATE: 24 resp/min
pH, Arterial: 7.2 — ABNORMAL LOW (ref 7.350–7.450)
pO2, Arterial: 97 mmHg (ref 83.0–108.0)

## 2015-10-11 LAB — MAGNESIUM
MAGNESIUM: 2.1 mg/dL (ref 1.7–2.4)
Magnesium: 2 mg/dL (ref 1.7–2.4)

## 2015-10-11 LAB — BASIC METABOLIC PANEL
Anion gap: 12 (ref 5–15)
Anion gap: 8 (ref 5–15)
BUN: 55 mg/dL — AB (ref 6–20)
BUN: 55 mg/dL — AB (ref 6–20)
CALCIUM: 9.2 mg/dL (ref 8.9–10.3)
CALCIUM: 9.1 mg/dL (ref 8.9–10.3)
CO2: 25 mmol/L (ref 22–32)
CO2: 27 mmol/L (ref 22–32)
CREATININE: 2.07 mg/dL — AB (ref 0.61–1.24)
Chloride: 102 mmol/L (ref 101–111)
Chloride: 104 mmol/L (ref 101–111)
Creatinine, Ser: 2.09 mg/dL — ABNORMAL HIGH (ref 0.61–1.24)
GFR calc Af Amer: 44 mL/min — ABNORMAL LOW (ref >60)
GFR calc Af Amer: 44 mL/min — ABNORMAL LOW (ref >60)
GFR, EST NON AFRICAN AMERICAN: 38 mL/min — AB (ref >60)
GFR, EST NON AFRICAN AMERICAN: 38 mL/min — AB (ref >60)
GLUCOSE: 115 mg/dL — AB (ref 65–99)
GLUCOSE: 135 mg/dL — AB (ref 65–99)
Potassium: 4.3 mmol/L (ref 3.5–5.1)
Potassium: 4.2 mmol/L (ref 3.5–5.1)
Sodium: 139 mmol/L (ref 135–145)
Sodium: 139 mmol/L (ref 135–145)

## 2015-10-11 LAB — CULTURE, RESPIRATORY W GRAM STAIN: Culture: NORMAL

## 2015-10-11 LAB — OCCULT BLOOD X 1 CARD TO LAB, STOOL: Fecal Occult Bld: POSITIVE — AB

## 2015-10-11 LAB — APTT: APTT: 63 s — AB (ref 24–36)

## 2015-10-11 MED ORDER — MIDAZOLAM HCL 2 MG/2ML IJ SOLN
1.0000 mg | INTRAMUSCULAR | Status: DC | PRN
  Administered 2015-10-11 – 2015-10-13 (×7): 2 mg via INTRAVENOUS
  Filled 2015-10-11 (×7): qty 2

## 2015-10-11 MED ORDER — AMIODARONE HCL 200 MG PO TABS
200.0000 mg | ORAL_TABLET | Freq: Two times a day (BID) | ORAL | Status: DC
  Administered 2015-10-11 – 2015-10-18 (×14): 200 mg
  Filled 2015-10-11 (×16): qty 1

## 2015-10-11 MED ORDER — DEXMEDETOMIDINE HCL IN NACL 400 MCG/100ML IV SOLN
0.0000 ug/kg/h | INTRAVENOUS | Status: DC
  Administered 2015-10-11: 0.5 ug/kg/h via INTRAVENOUS
  Administered 2015-10-11: 0.8 ug/kg/h via INTRAVENOUS
  Administered 2015-10-11 – 2015-10-12 (×2): 0.3 ug/kg/h via INTRAVENOUS
  Administered 2015-10-12: 0.174 ug/kg/h via INTRAVENOUS
  Filled 2015-10-11 (×6): qty 100
  Filled 2015-10-11: qty 50
  Filled 2015-10-11: qty 100
  Filled 2015-10-11: qty 50

## 2015-10-11 NOTE — Progress Notes (Signed)
[facility]  ROUNDING NOTE   Subjective:   Off norepinephrine since 10pm  UF 2390 UOP 66 Net -386.5  Objective:  Vital signs in last 24 hours:  Temp:  [97.6 F (36.4 C)-98.5 F (36.9 C)] 98.5 F (36.9 C) (01/23 0445) Pulse Rate:  [85-97] 94 (01/23 0800) Resp:  [6-32] 32 (01/23 0800) BP: (97-117)/(57-83) 109/69 mmHg (01/23 0800) SpO2:  [90 %-97 %] 95 % (01/23 0800) FiO2 (%):  [40 %-60 %] 60 % (01/23 0811) Weight:  [289.2 kg (637 lb 9.1 oz)] 289.2 kg (637 lb 9.1 oz) (01/23 0500)  Weight change: -0.648 kg (-1 lb 6.9 oz) Filed Weights   10/09/15 0500 10/10/15 0630 10/11/15 0500  Weight: 292.4 kg (644 lb 10 oz) 289.848 kg (639 lb) 289.2 kg (637 lb 9.1 oz)    Intake/Output: I/O last 3 completed shifts: In: 3387.6 [I.V.:537.6; NG/GT:2850] Out: 3859 [Urine:89; Other:3770]   Intake/Output this shift:     Physical Exam: General: Critically ill  Head: [place]/AT difficult to assess hearing  Eyes: Mild icterus noted  Neck: Tracheostomy in place  Lungs:  Bilateral rhonchi, vent assisted, Pressure Control Fio2 60%  Heart: S1S2 no rubs  Abdomen:  Soft, nontender, obese  Extremities: 3+ generalized edema.  Neurologic: Awake, will turn head on command  Skin: No lesions  Access Right IJ temp HD catheter 1/18 Dr. Gilda Crease  GU: Foley    Basic Metabolic Panel:  Recent Labs Lab 10/08/15 2332 10/09/15 0319 10/09/15 0981 10/09/15 1737  10/10/15 0421 10/10/15 1914 10/10/15 1238 10/10/15 1811 10/10/15 1938 10/11/15 0429 10/11/15 0626  NA 141 141 142 141  < > 141 140 140  --  139 141  --   K 4.6 4.6 4.6 5.0  < > 4.6 4.5 4.3  --  4.2 4.2  --   CL 104 104 106 104  < > 106 104 105  --  104 105  --   CO2 < > --  28 29  --   GLUCOSE 134* 131* 119* 128*  < > 129* 128* 106*  --  103* 115*  --   BUN 80* 77* 74* 73*  < > 69* 68* 64*  --  64* 60*  --   CREATININE 2.73* 2.65* 2.79* 2.69*  < > 2.42* 2.51* 2.31*  --  2.36* 2.11*  --   CALCIUM 9.1 9.0  9.1 9.2  < > 9.2 9.0 9.1  --  9.0 9.2  --   MG 2.3 2.2 2.1 2.1  --   --  2.1  --  2.1  --   --  2.1  PHOS 3.4 3.3 3.2 4.1  --   --  3.0  --   --   --   --   --   < > = values in this interval not displayed.  Liver Function Tests:  Recent Labs Lab 10/06/15 0500  10/09/15 0319 10/09/15 0809 10/09/15 1737 10/10/15 0539 10/11/15 0429  AST 41  --   --   --   --   --  176*  ALT 13*  --   --   --   --   --  60  ALKPHOS 74  --   --   --   --   --  103  BILITOT 3.6*  --   --   --   --   --  3.8*  PROT 7.0  --   --   --   --   --  6.9  ALBUMIN 2.9*  < > 3.0* 3.2* 3.3* 3.1* 3.0*  < > = values in this interval not displayed. No results for input(s): LIPASE, AMYLASE in the last 168 hours. No results for input(s): AMMONIA in the last 168 hours.  CBC:  Recent Labs Lab 10/07/15 1116 10/08/15 0346 10/09/15 0319 10/10/15 0421 10/11/15 0429  WBC 9.0 9.0 8.4 9.5 10.2  NEUTROABS 6.1  --   --   --   --   HGB 10.5* 10.4* 10.2* 9.8* 9.9*  HCT 35.5* 34.5* 33.7* 33.3* 33.6*  MCV 87.6 86.4 86.9 85.8 86.4  PLT 83* 72* 58* 39* 40*    Cardiac Enzymes: No results for input(s): CKTOTAL, CKMB, CKMBINDEX, TROPONINI in the last 168 hours.  BNP: Invalid input(s): POCBNP  CBG:  Recent Labs Lab 10/10/15 1242 10/10/15 1800 10/10/15 2006 10/11/15 0052 10/11/15 0725  GLUCAP 93 102* 88 101* 100*    Microbiology: Results for orders placed or performed during the hospital encounter of 09/05/2015  Blood culture (routine x 2)     Status: None   Collection Time: 09/12/2015 11:53 PM  Result Value Ref Range Status   Specimen Description BLOOD LEFT HAND  Final   Special Requests BOTTLES DRAWN AEROBIC AND ANAEROBIC 4CC  Final   Culture NO GROWTH 5 DAYS  Final   Report Status 09/23/2015 FINAL  Final  Blood culture (routine x 2)     Status: None   Collection Time: 09/16/2015 11:53 PM  Result Value Ref Range Status   Specimen Description BLOOD LEFT ARM  Final   Special Requests BOTTLES DRAWN AEROBIC AND  ANAEROBIC 5CC  Final   Culture NO GROWTH 5 DAYS  Final   Report Status 09/23/2015 FINAL  Final  Urine culture     Status: None   Collection Time: 09/18/15 12:21 AM  Result Value Ref Range Status   Specimen Description URINE, RANDOM  Final   Special Requests NONE  Final   Culture MULTIPLE SPECIES PRESENT, SUGGEST RECOLLECTION  Final   Report Status 09/19/2015 FINAL  Final  Rapid Influenza A&B Antigens ([facility] only)     Status: None   Collection Time: 09/18/15  6:26 AM  Result Value Ref Range Status   Influenza A ([facility]) NOT DETECTED  Final   Influenza B ([facility]) NOT DETECTED  Final  MRSA PCR Screening     Status: None   Collection Time: 09/20/15  6:30 AM  Result Value Ref Range Status   MRSA by PCR NEGATIVE NEGATIVE Final    Comment:        The GeneXpert MRSA Assay (FDA approved for NASAL specimens only), is one component of a comprehensive MRSA colonization surveillance program. It is not intended to diagnose MRSA infection nor to guide or monitor treatment for MRSA infections.   Culture, respiratory (NON-Expectorated)     Status: None   Collection Time: 09/22/15  3:13 PM  Result Value Ref Range Status   Specimen Description TRACHEAL ASPIRATE  Final   Special Requests NONE  Final   Gram Stain   Final    GOOD SPECIMEN - 80-90% WBCS MODERATE WBC SEEN RARE YEAST RARE GRAM NEGATIVE COCCOBACILLI    Culture LIGHT GROWTH CANDIDA TROPICALIS  Final   Report Status 09/26/2015 FINAL  Final  Urine culture     Status: None   Collection Time: 09/22/15 11:16 PM  Result Value Ref Range Status   Specimen Description URINE, RANDOM  Final   Special Requests NONE  Final   Culture  NO GROWTH 2 DAYS  Final   Report Status 09/24/2015 FINAL  Final  CULTURE, BLOOD (ROUTINE X 2) w Reflex to PCR ID Panel     Status: None   Collection Time: 09/22/15 11:44 PM  Result Value Ref Range Status   Specimen Description BLOOD RIGHT ANTECUBITAL  Final   Special Requests BOTTLES DRAWN AEROBIC AND ANAEROBIC   Final   Culture NO GROWTH 5 DAYS  Final   Report Status 09/28/2015 FINAL  Final  CULTURE, BLOOD (ROUTINE X 2) w Reflex to PCR ID Panel     Status: None   Collection Time: 09/22/15 11:56 PM  Result Value Ref Range Status   Specimen Description BLOOD LEFT HAND  Final   Special Requests BOTTLES DRAWN AEROBIC AND ANAEROBIC  Final   Culture NO GROWTH 5 DAYS  Final   Report Status 09/28/2015 FINAL  Final  Culture, respiratory (NON-Expectorated)     Status: None   Collection Time: 09/23/15 12:01 AM  Result Value Ref Range Status   Specimen Description TRACHEAL ASPIRATE  Final   Special Requests NONE  Final   Gram Stain   Final    GOOD SPECIMEN - 80-90% WBCS MODERATE WBC SEEN RARE YEAST RARE GRAM POSITIVE COCCI    Culture Consistent with normal respiratory flora.  Final   Report Status 09/25/2015 FINAL  Final  Urine culture     Status: None   Collection Time: 10/01/15 10:35 AM  Result Value Ref Range Status   Specimen Description URINE, RANDOM  Final   Special Requests NONE  Final   Culture NO GROWTH 2 DAYS  Final   Report Status 10/03/2015 FINAL  Final  CULTURE, BLOOD (ROUTINE X 2) w Reflex to PCR ID Panel     Status: None   Collection Time: 10/01/15 10:56 AM  Result Value Ref Range Status   Specimen Description BLOOD RIGHT ARM  Final   Special Requests   Final    BOTTLES DRAWN AEROBIC AND ANAEROBIC  AER 3CC ANA 5CC   Culture NO GROWTH 5 DAYS  Final   Report Status 10/06/2015 FINAL  Final  CULTURE, BLOOD (ROUTINE X 2) w Reflex to PCR ID Panel     Status: None   Collection Time: 10/01/15 11:14 AM  Result Value Ref Range Status   Specimen Description BLOOD LEFT HAND  Final   Special Requests   Final    BOTTLES DRAWN AEROBIC AND ANAEROBIC  AER 3CC ANA 4CC   Culture  Setup Time   Final    GRAM POSITIVE COCCI ANAEROBIC BOTTLE ONLY CRITICAL RESULT CALLED TO, READ BACK BY AND VERIFIED WITH: CRYSTAL SCARPENA AT 0915 ON 10/02/15 CTJ    Culture   Final    COAGULASE NEGATIVE  STAPHYLOCOCCUS ANAEROBIC BOTTLE ONLY Results consistent with contamination.    Report Status 10/06/2015 FINAL  Final  Blood Culture ID Panel (Reflexed)     Status: Abnormal   Collection Time: 10/01/15 11:14 AM  Result Value Ref Range Status   Enterococcus species NOT DETECTED NOT DETECTED Final   Listeria monocytogenes NOT DETECTED NOT DETECTED Final   Staphylococcus species DETECTED (A) NOT DETECTED Final   Staphylococcus aureus NOT DETECTED NOT DETECTED Final   Streptococcus species NOT DETECTED NOT DETECTED Final   Streptococcus agalactiae NOT DETECTED NOT DETECTED Final   Streptococcus pneumoniae NOT DETECTED NOT DETECTED Final   Streptococcus pyogenes NOT DETECTED NOT DETECTED Final   Acinetobacter baumannii NOT DETECTED NOT DETECTED Final   Enterobacteriaceae species  NOT DETECTED NOT DETECTED Final   Enterobacter cloacae complex NOT DETECTED NOT DETECTED Final   Escherichia coli NOT DETECTED NOT DETECTED Final   Klebsiella oxytoca NOT DETECTED NOT DETECTED Final   Klebsiella pneumoniae NOT DETECTED NOT DETECTED Final   Proteus species NOT DETECTED NOT DETECTED Final   Serratia marcescens NOT DETECTED NOT DETECTED Final   Haemophilus influenzae NOT DETECTED NOT DETECTED Final   Neisseria meningitidis NOT DETECTED NOT DETECTED Final   Pseudomonas aeruginosa NOT DETECTED NOT DETECTED Final   Candida albicans NOT DETECTED NOT DETECTED Final   Candida glabrata NOT DETECTED NOT DETECTED Final   Candida krusei NOT DETECTED NOT DETECTED Final   Candida parapsilosis NOT DETECTED NOT DETECTED Final   Candida tropicalis NOT DETECTED NOT DETECTED Final   Carbapenem resistance NOT DETECTED NOT DETECTED Final   Methicillin resistance DETECTED (A) NOT DETECTED Final    Comment: CRITICAL RESULT CALLED TO, READ BACK BY AND VERIFIED WITH: CRYSTAL SCARPENA FOR STAPHYLOCOCCUS SPECIES AND METHICILLIN RESISTANCE AT 0915 ON 10/02/15 CTJ    Vancomycin resistance NOT DETECTED NOT DETECTED Final   Culture, expectorated sputum-assessment     Status: None   Collection Time: 10/01/15 11:51 AM  Result Value Ref Range Status   Specimen Description EXPECTORATED SPUTUM  Final   Special Requests Normal  Final   Sputum evaluation THIS SPECIMEN IS ACCEPTABLE FOR SPUTUM CULTURE  Final   Report Status 10/01/2015 FINAL  Final  Culture, respiratory (NON-Expectorated)     Status: None   Collection Time: 10/01/15 11:51 AM  Result Value Ref Range Status   Specimen Description EXPECTORATED SPUTUM  Final   Special Requests Normal Reflexed from [id number]  Final   Gram Stain   Final    GOOD SPECIMEN - 80-90% WBCS MODERATE WBC SEEN FEW YEAST    Culture   Final    LIGHT GROWTH CANDIDA ALBICANS LIGHT GROWTH CANDIDA DUBLINIENSIS    Report Status 10/06/2015 FINAL  Final  Culture, expectorated sputum-assessment     Status: None   Collection Time: 10/08/15  3:30 PM  Result Value Ref Range Status   Specimen Description TRACHEAL ASPIRATE  Final   Special Requests NONE  Final   Sputum evaluation THIS SPECIMEN IS ACCEPTABLE FOR SPUTUM CULTURE  Final   Report Status 10/08/2015 FINAL  Final  Culture, respiratory (NON-Expectorated)     Status: None (Preliminary result)   Collection Time: 10/08/15  3:30 PM  Result Value Ref Range Status   Specimen Description TRACHEAL ASPIRATE  Final   Special Requests NONE Reflexed from [id number]  Final   Gram Stain   Final    FAIR SPECIMEN - 70-80% WBCS FEW WBC SEEN RARE GRAM POSITIVE COCCI RARE GRAM NEGATIVE RODS    Culture HOLDING FOR POSSIBLE PATHOGEN  Final   Report Status PENDING  Incomplete    Coagulation Studies: No results for input(s): LABPROT, INR in the last 72 hours.  Urinalysis: No results for input(s): COLORURINE, LABSPEC, PHURINE, GLUCOSEU, HGBUR, BILIRUBINUR, KETONESUR, PROTEINUR, UROBILINOGEN, NITRITE, LEUKOCYTESUR in the last 72 hours.  Invalid input(s): APPERANCEUR    Imaging: Dg Chest Port 1 View  10/11/2015  CLINICAL DATA:  Respiratory  failure. EXAM: PORTABLE CHEST 1 VIEW COMPARISON:  10/09/2015 FINDINGS: Study limited by body habitus and portable nature of the study. Tracheostomy and right central line are unchanged. Cardiomegaly with bilateral airspace opacities, worsening since prior study, likely worsening edema. Probable superimposed bibasilar atelectasis. Cannot exclude small effusions. IMPRESSION: Worsening bilateral airspace disease, likely a combination of worsening edema/  CHF and bibasilar atelectasis. Possible small layering effusions. Electronically Signed   By: Charlett Nose M.D.   On: 10/11/2015 07:26     Medications:   . fentaNYL infusion INTRAVENOUS 125 mcg/hr (10/10/15 1957)  . norepinephrine (LEVOPHED) Adult infusion Stopped (10/10/15 2121)  . pureflow 2,500 mL/hr at 10/11/15 0500   . amiodarone  400 mg Per Tube BID  . antiseptic oral rinse  7 mL Mouth Rinse 10 times per day  . budesonide  0.25 mg Nebulization 4 times per day  . chlorhexidine gluconate  15 mL Mouth Rinse BID  . docusate  100 mg Oral BID  . famotidine  20 mg Per Tube QODAY  . feeding supplement (PRO-STAT SUGAR FREE 64)  30 mL Per Tube TID  . feeding supplement (VITAL HIGH PROTEIN)  1,000 mL Per Tube Q24H  . free water  30 mL Per Tube 6 times per day  . Influenza vac split quadrivalent PF  0.5 mL Intramuscular Tomorrow-1000  . ipratropium-albuterol  3 mL Nebulization Q6H  . nystatin   Topical BID  . PARoxetine  40 mg Oral Daily  . sennosides  5 mL Oral BID   acetaminophen **OR** acetaminophen, albuterol, bisacodyl, calcium carbonate, fentaNYL (SUBLIMAZE) injection, heparin, LORazepam, metoprolol, ondansetron **OR** ondansetron (ZOFRAN) IV, polyethylene glycol, sodium chloride  Assessment/ Plan:  Mr. Mitchell Morrow is a 40 y.o. black male with morbid obesity, tobacco abuse, without prescribed home medications, who was admitted to [facility] on 09/05/2015, s/p tracheostomy placement, persistent respiratory failure, multiple episodes of ARF  1.  Acute Renal Failure:  Second episode of ARF, reconsulted on 10/05/15, hypotension contributing now. Started renal replacement therapy 10/06/15. Baseline creatinine of 1.15 on admission.  Anuric. Now off vasopressors.  - Continue CRRT until cartridge expires. Then will reassess renal replacement therapy. Does not seem to be a good candidate for outpatient hemodialysis at this time.  - Continue to monitor volume status, urine output, serum electrolytes and renal function.  - Renally dose all medications   2. Acute hypercapnic respiratory failure with bilateral pneumonia and acute exacerbation of systolic congestive heart failure:  With underlying obesity hypoventilation syndrome. Tracheostomy Oct 30, 2015. Ventilator dependent. ABG has been improving. CXR reviewed.  - Appreciate pulm input.   3.  Anemia with renal failure: hemoglobin down to 9.9.  - No acute indication for epo or transfusion - continue to monitor.   4. Hypotension/sepsis: now off vasopressors. Completed course of antibiotics.  - Continue to monitor.    LOS: 24 Mitchell Morrow 1/23/20178:40 AM

## 2015-10-11 NOTE — Progress Notes (Signed)
MEDICATION RELATED CONSULT NOTE - INITIAL   Pharmacy Consult for CRRT dosing  No Known Allergies  Patient Measurements: Height: 5\' 9"  (175.3 cm) Weight: (!) 637 lb 9.1 oz (289.2 kg) IBW/kg (Calculated) : 70.7  Vital Signs: Temp: 98.3 F (36.8 C) (01/23 0800) Temp Source: Oral (01/23 0800) BP: 95/57 mmHg (01/23 1000) Pulse Rate: 94 (01/23 1000) Intake/Output from previous day: 01/22 0701 - 01/23 0700 In: 2065.7 [I.V.:325.7; NG/GT:1740] Out: 2458 [Urine:66] Intake/Output from this shift: Total I/O In: 255 [I.V.:45; NG/GT:210] Out: 250 [Other:250]  Labs:  Recent Labs  10/09/15 0319 10/09/15 0444 10/09/15 0809 10/09/15 1737  10/10/15 0421 10/10/15 0539 10/10/15 1238 10/10/15 1811 10/10/15 1938 10/11/15 0429 10/11/15 0626  WBC 8.4  --   --   --   --  9.5  --   --   --   --  10.2  --   HGB 10.2*  --   --   --   --  9.8*  --   --   --   --  9.9*  --   HCT 33.7*  --   --   --   --  33.3*  --   --   --   --  33.6*  --   PLT 58*  --   --   --   --  39*  --   --   --   --  40*  --   APTT  --  65*  --   --   --  68*  --   --   --   --  63*  --   CREATININE 2.65*  --  2.79* 2.69*  < > 2.42* 2.51* 2.31*  --  2.36* 2.11*  --   MG 2.2  --  2.1 2.1  --   --  2.1  --  2.1  --   --  2.1  PHOS 3.3  --  3.2 4.1  --   --  3.0  --   --   --   --   --   ALBUMIN 3.0*  --  3.2* 3.3*  --   --  3.1*  --   --   --  3.0*  --   PROT  --   --   --   --   --   --   --   --   --   --  6.9  --   AST  --   --   --   --   --   --   --   --   --   --  176*  --   ALT  --   --   --   --   --   --   --   --   --   --  60  --   ALKPHOS  --   --   --   --   --   --   --   --   --   --  103  --   BILITOT  --   --   --   --   --   --   --   --   --   --  3.8*  --   < > = values in this interval not displayed. Estimated Creatinine Clearance: 103 mL/min (by C-G formula based on Cr of 2.11).   Microbiology: Recent Results (from the past 720 hour(s))  Blood culture (routine x 2)     Status: None   Collection Time: 10-13-2015 11:53 PM  Result Value Ref Range Status   Specimen Description BLOOD LEFT HAND  Final   Special Requests BOTTLES DRAWN AEROBIC AND ANAEROBIC 4CC  Final   Culture NO GROWTH 5 DAYS  Final   Report Status 09/23/2015 FINAL  Final  Blood culture (routine x 2)     Status: None   Collection Time: October 13, 2015 11:53 PM  Result Value Ref Range Status   Specimen Description BLOOD LEFT ARM  Final   Special Requests BOTTLES DRAWN AEROBIC AND ANAEROBIC 5CC  Final   Culture NO GROWTH 5 DAYS  Final   Report Status 09/23/2015 FINAL  Final  Urine culture     Status: None   Collection Time: 09/18/15 12:21 AM  Result Value Ref Range Status   Specimen Description URINE, RANDOM  Final   Special Requests NONE  Final   Culture MULTIPLE SPECIES PRESENT, SUGGEST RECOLLECTION  Final   Report Status 09/19/2015 FINAL  Final  Rapid Influenza A&B Antigens ([facility] only)     Status: None   Collection Time: 09/18/15  6:26 AM  Result Value Ref Range Status   Influenza A ([facility]) NOT DETECTED  Final   Influenza B ([facility]) NOT DETECTED  Final  MRSA PCR Screening     Status: None   Collection Time: 09/20/15  6:30 AM  Result Value Ref Range Status   MRSA by PCR NEGATIVE NEGATIVE Final    Comment:        The GeneXpert MRSA Assay (FDA approved for NASAL specimens only), is one component of a comprehensive MRSA colonization surveillance program. It is not intended to diagnose MRSA infection nor to guide or monitor treatment for MRSA infections.   Culture, respiratory (NON-Expectorated)     Status: None   Collection Time: 09/22/15  3:13 PM  Result Value Ref Range Status   Specimen Description TRACHEAL ASPIRATE  Final   Special Requests NONE  Final   Gram Stain   Final    GOOD SPECIMEN - 80-90% WBCS MODERATE WBC SEEN RARE YEAST RARE GRAM NEGATIVE COCCOBACILLI    Culture LIGHT GROWTH CANDIDA TROPICALIS  Final   Report Status 09/26/2015 FINAL  Final  Urine culture     Status: None    Collection Time: 09/22/15 11:16 PM  Result Value Ref Range Status   Specimen Description URINE, RANDOM  Final   Special Requests NONE  Final   Culture NO GROWTH 2 DAYS  Final   Report Status 09/24/2015 FINAL  Final  CULTURE, BLOOD (ROUTINE X 2) w Reflex to PCR ID Panel     Status: None   Collection Time: 09/22/15 11:44 PM  Result Value Ref Range Status   Specimen Description BLOOD RIGHT ANTECUBITAL  Final   Special Requests BOTTLES DRAWN AEROBIC AND ANAEROBIC  Final   Culture NO GROWTH 5 DAYS  Final   Report Status 09/28/2015 FINAL  Final  CULTURE, BLOOD (ROUTINE X 2) w Reflex to PCR ID Panel     Status: None   Collection Time: 09/22/15 11:56 PM  Result Value Ref Range Status   Specimen Description BLOOD LEFT HAND  Final   Special Requests BOTTLES DRAWN AEROBIC AND ANAEROBIC  Final   Culture NO GROWTH 5 DAYS  Final   Report Status 09/28/2015 FINAL  Final  Culture, respiratory (NON-Expectorated)     Status: None   Collection Time: 09/23/15 12:01 AM  Result Value  Ref Range Status   Specimen Description TRACHEAL ASPIRATE  Final   Special Requests NONE  Final   Gram Stain   Final    GOOD SPECIMEN - 80-90% WBCS MODERATE WBC SEEN RARE YEAST RARE GRAM POSITIVE COCCI    Culture Consistent with normal respiratory flora.  Final   Report Status 09/25/2015 FINAL  Final  Urine culture     Status: None   Collection Time: 10/01/15 10:35 AM  Result Value Ref Range Status   Specimen Description URINE, RANDOM  Final   Special Requests NONE  Final   Culture NO GROWTH 2 DAYS  Final   Report Status 10/03/2015 FINAL  Final  CULTURE, BLOOD (ROUTINE X 2) w Reflex to PCR ID Panel     Status: None   Collection Time: 10/01/15 10:56 AM  Result Value Ref Range Status   Specimen Description BLOOD RIGHT ARM  Final   Special Requests   Final    BOTTLES DRAWN AEROBIC AND ANAEROBIC  AER 3CC ANA 5CC   Culture NO GROWTH 5 DAYS  Final   Report Status 10/06/2015 FINAL  Final  CULTURE, BLOOD  (ROUTINE X 2) w Reflex to PCR ID Panel     Status: None   Collection Time: 10/01/15 11:14 AM  Result Value Ref Range Status   Specimen Description BLOOD LEFT HAND  Final   Special Requests   Final    BOTTLES DRAWN AEROBIC AND ANAEROBIC  AER 3CC ANA 4CC   Culture  Setup Time   Final    GRAM POSITIVE COCCI ANAEROBIC BOTTLE ONLY CRITICAL RESULT CALLED TO, READ BACK BY AND VERIFIED WITH: CRYSTAL SCARPENA AT 0915 ON 10/02/15 CTJ    Culture   Final    COAGULASE NEGATIVE STAPHYLOCOCCUS ANAEROBIC BOTTLE ONLY Results consistent with contamination.    Report Status 10/06/2015 FINAL  Final  Blood Culture ID Panel (Reflexed)     Status: Abnormal   Collection Time: 10/01/15 11:14 AM  Result Value Ref Range Status   Enterococcus species NOT DETECTED NOT DETECTED Final   Listeria monocytogenes NOT DETECTED NOT DETECTED Final   Staphylococcus species DETECTED (A) NOT DETECTED Final   Staphylococcus aureus NOT DETECTED NOT DETECTED Final   Streptococcus species NOT DETECTED NOT DETECTED Final   Streptococcus agalactiae NOT DETECTED NOT DETECTED Final   Streptococcus pneumoniae NOT DETECTED NOT DETECTED Final   Streptococcus pyogenes NOT DETECTED NOT DETECTED Final   Acinetobacter baumannii NOT DETECTED NOT DETECTED Final   Enterobacteriaceae species NOT DETECTED NOT DETECTED Final   Enterobacter cloacae complex NOT DETECTED NOT DETECTED Final   Escherichia coli NOT DETECTED NOT DETECTED Final   Klebsiella oxytoca NOT DETECTED NOT DETECTED Final   Klebsiella pneumoniae NOT DETECTED NOT DETECTED Final   Proteus species NOT DETECTED NOT DETECTED Final   Serratia marcescens NOT DETECTED NOT DETECTED Final   Haemophilus influenzae NOT DETECTED NOT DETECTED Final   Neisseria meningitidis NOT DETECTED NOT DETECTED Final   Pseudomonas aeruginosa NOT DETECTED NOT DETECTED Final   Candida albicans NOT DETECTED NOT DETECTED Final   Candida glabrata NOT DETECTED NOT DETECTED Final   Candida krusei NOT  DETECTED NOT DETECTED Final   Candida parapsilosis NOT DETECTED NOT DETECTED Final   Candida tropicalis NOT DETECTED NOT DETECTED Final   Carbapenem resistance NOT DETECTED NOT DETECTED Final   Methicillin resistance DETECTED (A) NOT DETECTED Final    Comment: CRITICAL RESULT CALLED TO, READ BACK BY AND VERIFIED WITH: CRYSTAL SCARPENA FOR STAPHYLOCOCCUS SPECIES AND METHICILLIN RESISTANCE  AT 0915 ON 10/02/15 CTJ    Vancomycin resistance NOT DETECTED NOT DETECTED Final  Culture, expectorated sputum-assessment     Status: None   Collection Time: 10/01/15 11:51 AM  Result Value Ref Range Status   Specimen Description EXPECTORATED SPUTUM  Final   Special Requests Normal  Final   Sputum evaluation THIS SPECIMEN IS ACCEPTABLE FOR SPUTUM CULTURE  Final   Report Status 10/01/2015 FINAL  Final  Culture, respiratory (NON-Expectorated)     Status: None   Collection Time: 10/01/15 11:51 AM  Result Value Ref Range Status   Specimen Description EXPECTORATED SPUTUM  Final   Special Requests Normal Reflexed from [id number]  Final   Gram Stain   Final    GOOD SPECIMEN - 80-90% WBCS MODERATE WBC SEEN FEW YEAST    Culture   Final    LIGHT GROWTH CANDIDA ALBICANS LIGHT GROWTH CANDIDA DUBLINIENSIS    Report Status 10/06/2015 FINAL  Final  Culture, expectorated sputum-assessment     Status: None   Collection Time: 10/08/15  3:30 PM  Result Value Ref Range Status   Specimen Description TRACHEAL ASPIRATE  Final   Special Requests NONE  Final   Sputum evaluation THIS SPECIMEN IS ACCEPTABLE FOR SPUTUM CULTURE  Final   Report Status 10/08/2015 FINAL  Final  Culture, respiratory (NON-Expectorated)     Status: None   Collection Time: 10/08/15  3:30 PM  Result Value Ref Range Status   Specimen Description TRACHEAL ASPIRATE  Final   Special Requests NONE Reflexed from [id number]  Final   Gram Stain   Final    FAIR SPECIMEN - 70-80% WBCS FEW WBC SEEN RARE GRAM POSITIVE COCCI RARE GRAM NEGATIVE RODS    Culture  Consistent with normal respiratory flora.  Final   Report Status 10/11/2015 FINAL  Final    Medical History: Past Medical History  Diagnosis Date  . Super obesity (HCC)   . Tobacco abuse   . Chronic systolic CHF (congestive heart failure) (HCC)   . Atrial fibrillation (HCC) 09/2015    Medications:  Scheduled:  . amiodarone  200 mg Per Tube BID  . antiseptic oral rinse  7 mL Mouth Rinse 10 times per day  . budesonide  0.25 mg Nebulization 4 times per day  . chlorhexidine gluconate  15 mL Mouth Rinse BID  . docusate  100 mg Oral BID  . famotidine  20 mg Per Tube QODAY  . feeding supplement (PRO-STAT SUGAR FREE 64)  30 mL Per Tube TID  . feeding supplement (VITAL HIGH PROTEIN)  1,000 mL Per Tube Q24H  . free water  30 mL Per Tube 6 times per day  . Influenza vac split quadrivalent PF  0.5 mL Intramuscular Tomorrow-1000  . ipratropium-albuterol  3 mL Nebulization Q6H  . nystatin   Topical BID  . PARoxetine  40 mg Oral Daily  . sennosides  5 mL Oral BID   Infusions:  . fentaNYL infusion INTRAVENOUS 200 mcg/hr (10/11/15 1145)  . norepinephrine (LEVOPHED) Adult infusion Stopped (10/10/15 2121)  . pureflow 2,500 mL/hr at 10/11/15 1145    Assessment: Pharmacy consulted to renally adjust medications in this 40 y/o M with respiratory failure, CHF, and ARF to begin CRRT.   Plan:  No medications require adjustment at present. Pharmacy will continue to monitor and adjust as needed.   Valentina Gu, RPh Clinical Pharmacist 10/11/2015,12:31 PM

## 2015-10-11 NOTE — Progress Notes (Signed)
Spoke with Elink MD about Pt's condition. Pt difficult to arouse and still showing signs of labored breathing. Levophed gtt restarted for Pt's BP. Precedex gtt decreased since start of shift. MD also informed of positive fecal occult blood result. No new orders given. Will continue current treatment and will monitor Pt closely.

## 2015-10-11 NOTE — Progress Notes (Signed)
ELink notified of positive fecal occult blood result.

## 2015-10-11 NOTE — Progress Notes (Addendum)
PULMONARY/CCM PROGRESS NOTE  40 year old massively obese male with acute CHF, volume overload. Now with acute renal failure, vent dependent respiratory failure status post trach.    Lines, Tubes, etc: ETT 01/02 >> . Patient self extubated, subsequently reintubated 09/25/2015 L IJ CVL 01/02 >>   Microbiology: Blood 12/30 >>  MRSA PCR 01/02 >> NEG Urine 01/04 >> NEG Blood 01/04 >> no growth   PCT 01/04: 0.74  Antibiotics:  Levofloxacin 12/30 >> 01/01 Ceftriaxone 01/02 >> 01/02 Azithro 01/02 >> 01/02 Levofloxacin 01/04 >> 01/05 Vanc 01/05 >> 1/9 Ceftaz 01/05 >>1/9   PCT 01/04: 0.74, 0.95, 0.77  Studies/Events: 12/31 TTE:  poor quality study, LVEF 45% 12/31 RUQ: Nodular contour the liver raising the question of cirrhosis. Gallbladder wall thickening, nonspecific in appearance 01/04 night - fever, hypotension. Cultures obtained. Abx expanded -failed self extubation   Best Practice: DVT: enoxaparin SUP: enteral famotidine Nutrition: TFs    Subj: Patient arousable, does not follow commands. More agitated this AM, sedation increased.  Obj: Filed Vitals:   10/11/15 0600 10/11/15 0700 10/11/15 0800 10/11/15 0900  BP: 107/73 102/66 109/69 104/61  Pulse: 97 94 94 92  Temp:      TempSrc:      Resp: 21 6 32 22  Height:      Weight:      SpO2: 93% 95% 95% 94%    Gen: morbidly obese HEENT:  WNL Neck: CVL site clean, JVP cannot be assessed Chest: clear today somewhat prolonged exp time Cardiac: distant HS, regular, no M noted Abd: obese, soft, + BS Ext: symmetric edema, chronic stasis changes Neuro: GCS<10T  BMET    Component Value Date/Time   NA 141 10/11/2015 0429   K 4.2 10/11/2015 0429   CL 105 10/11/2015 0429   CO2 29 10/11/2015 0429   GLUCOSE 115* 10/11/2015 0429   BUN 60* 10/11/2015 0429   CREATININE 2.11* 10/11/2015 0429   CALCIUM 9.2 10/11/2015 0429   GFRNONAA 37* 10/11/2015 0429   GFRAA 43* 10/11/2015 0429    CBC    Component Value  Date/Time   WBC 10.2 10/11/2015 0429   RBC 3.89* 10/11/2015 0429   HGB 9.9* 10/11/2015 0429   HCT 33.6* 10/11/2015 0429   PLT 40* 10/11/2015 0429   MCV 86.4 10/11/2015 0429   MCH 25.5* 10/11/2015 0429   MCHC 29.5* 10/11/2015 0429   RDW 22.5* 10/11/2015 0429   LYMPHSABS 0.8* 10/07/2015 1116   MONOABS 1.4* 10/07/2015 1116   EOSABS 0.5 10/07/2015 1116   BASOSABS 0.1 10/07/2015 1116    CXR:  continued atelectasis in the right lung apex. Persistent bibasilar atelectasis and effusions   IMPRESSION: -- Acute on chronic hypercarbic respiratory failure , pulmonary edema.  S/P trach tube placement 2015-10-05 -- Obesity hypoventilation syndrome -- Atelectasis -- Cardiomyopathy - LVEF 45% 09/18/15 -- Shock - suspect septic -- AKI, oliguric. CRRT initiated 1/18.  R IJ temporary HD catheter 10/06/15 -- Severe hypervolemia  -- Resolving severe sepsis - monitoring off abx presently -- ICU acquired anemia -- New thrombocytopenia - SQ heparin DC'd 01/22, on CRRT -- ICU associated delirium -- Intermittent tearfulness, situational depression  PLAN/RECS:  Cont full vent support - settings reviewed and/or adjusted  Better synchrony in PCV mode Cont vent bundle Daily SBT if/when meets criteria Wean norepinephrine to off for MAP > 65 mmHg Monitor BMET intermittently Monitor I/Os Correct electrolytes as indicated CRRT per Renal, no a good candidate for outpt HD Monitor Plts closely, could be related to CRRT cartridge -  Renal plan to stop CRRT today or when cartridge clots off/finished DVT px: SCDs Monitor CBC intermittently  Keep eye on plt counts Transfuse per usual ICU guidelines Monitoring off abx presently Cont fentanyl infusion Cont SSRI   CCM time: 45 mins The above time includes time spent in consultation with patient and/or family members and reviewing care plan on multidisciplinary rounds  Stephanie Acre, MD [facility] Pager [phone] (please  enter 7-digits) On Call Pager - [phone] (please enter 7-digits)    Addendum - patient with depressed neuro status, given large fat distribution volume his sedation was adjusted to the following - Precedex gtt, prn Versed - please avoid propofol, fentanyl and ativan.   Stephanie Acre, MD [facility] Pager [phone] (please enter 7-digits) On Call Pager - [phone] (please enter 7-digits)

## 2015-10-11 NOTE — Progress Notes (Signed)
[facility] - [place] at [facility]   PATIENT NAME: Mitchell Morrow    MR#:  [id number]  DATE OF BIRTH:  1976-07-02  SUBJECTIVE:  Patient remains on the ventilator. No acute changes noted. He is off pressors.  REVIEW OF SYSTEMS:   Review of Systems  Unable to perform ROS: critical illness    DRUG ALLERGIES:  No Known Allergies  VITALS:  Blood pressure 95/57, pulse 94, temperature 98.3 F (36.8 C), temperature source Oral, resp. rate 17, height  (1.753 m), weight 289.2 kg (637 lb 9.1 oz), SpO2 96 %.  PHYSICAL EXAMINATION:   Physical Exam  Constitutional: He is well-developed, well-nourished, and in no distress. No distress.  Morbidly obese  HENT:  Head: Normocephalic.  Eyes: No scleral icterus.  Neck: Normal range of motion. Neck supple. No JVD present. No tracheal deviation present.  Tracheostomy placed  Cardiovascular: Normal rate, regular rhythm and normal heart sounds.  Exam reveals no gallop and no friction rub.   No murmur heard. Pulmonary/Chest: Effort normal and breath sounds normal. No respiratory distress. He has no wheezes. He has no rales. He exhibits no tenderness.  Abdominal: Soft. Bowel sounds are normal. He exhibits no distension and no mass. There is no tenderness. There is no rebound and no guarding.  Musculoskeletal: Normal range of motion. He exhibits no edema.  Neurological:  Eyes open but patient does not follow commands  Skin: Skin is warm. No rash noted. No erythema.  Psychiatric: Judgment normal.     s.   LABORATORY PANEL:   CBC  Recent Labs Lab 10/11/15 0429  WBC 10.2  HGB 9.9*  HCT 33.6*  PLT 40*   ------------------------------------------------------------------------------------------------------------------  Chemistries   Recent Labs Lab 10/11/15 0429 10/11/15 0626  NA 141  --   K 4.2  --   CL 105  --   CO2 29  --   GLUCOSE 115*  --   BUN 60*  --   CREATININE 2.11*  --   CALCIUM 9.2  --    MG  --  2.1  AST 176*  --   ALT 60  --   ALKPHOS 103  --   BILITOT 3.8*  --    ------------------------------------------------------------------------------------------------------------------  Cardiac Enzymes No results for input(s): TROPONINI in the last 168 hours. ------------------------------------------------------------------------------------------------------------------  RADIOLOGY:  Dg Chest Port 1 View  10/11/2015  CLINICAL DATA:  Respiratory failure. EXAM: PORTABLE CHEST 1 VIEW COMPARISON:  10/09/2015 FINDINGS: Study limited by body habitus and portable nature of the study. Tracheostomy and right central line are unchanged. Cardiomegaly with bilateral airspace opacities, worsening since prior study, likely worsening edema. Probable superimposed bibasilar atelectasis. Cannot exclude small effusions. IMPRESSION: Worsening bilateral airspace disease, likely a combination of worsening edema/ CHF and bibasilar atelectasis. Possible small layering effusions. Electronically Signed   By: Charlett Nose M.D.   On: 10/11/2015 07:26    ASSESSMENT AND PLAN:   40 year old male with a history of morbid obesity and tobacco abuse who presented with lower extremity edema and found to have bilateral pneumonia.  1. Acute hypoxic hypercapnic respiratory failure-: This is multifactorial  due to obesity hypoventilation syndrome, community-acquired pneumonia and pulmonary edema versus ARDS.  Patient is Intubated and on ventilator. Tracheostomy done on 09/28/2015. Continue vent management as per pulmonary Patient has completed course of antibiotics for treatment of pneumonia with vancomycin and CEFTAZ. He is currently on fentanyl for sedation.  2. Bilateral community-acquired pneumonia with septic shock He was on vancomycin and ceftazidime  and he had completed course of treatment.   He was restarted levophed due to hypotension once CRRT was started. He is currently off of pressors.   3.New onset  systolic congestive heart failure,  EF 45%  - had elevated BNP, edema and vascular congestion on chest x-ray  Patient is responding well to CRRT  4. Acute renal failure - Due to ATN and diuresis Appreciate nephrology consult Due to persistent oliguria patient was started on  CRRT on 10/06/15. He is responding to CRRT. FiO2 has decreased.  As per nephrology consultation ,continue CRRT until cartridge expires. Then will reassess renal replacement therapy. Does not seem to be a good candidate for outpatient hemodialysis at this time  5. Nutrition-Currently has an NG tube with tube feeds. Will need a PEG tube. Due to his large body habitus,GI cannot do it endoscopically and as per IR team, patient will be too heavy for a CT table and also the fluoroscopic table due to his weight. So cannot be done by interventional radiology.  Patient will need to have an open procedure for his PEG tube placement by surgical team, however will be a high risk procedure considering his body habitus and also his other multiple medical problems. This will need to be done at bariatric center or teritiary care center- not accepted at [facility] [place] or [facility] at this time as per Dr Nemiah Commander. Continue on NG tube at this time.   6 hyperbilirubinemia: On admission could be passive congestion from congestive heart failure. hepatitis panel and ultrasound of right upper quadrant suggestive of cirrhotic changes. US showed Cirrhosis  7. Atrial fibrillation, -known history of paroxysmal atrial fibrillation. Continue on oral amidarone. I will change dose to 200 mg twice a day Appreciate cardiology consult. Risk of long-term full dose anticoagulation risks outweighs benefit at this time as per cardiac.  8. Anemia of chronic disease and illness: Continue to monitor hemoglobin. 9. Depression: Patient needs Paxil.  CODE STATUS: FULL  TOTAL CRITICAL CARE TIME SPENT IN TAKING CARE OF THIS PATIENT: 30 critical care minutes.  He  remains critically ill and high risk for cardiopulmonary arrest.     Mell Guia M.D on 10/11/2015   Between 7am to 6pm - Pager - [phone]  After 6pm go to [email or web address] - password [email or web address] [facility]  [facility]  Office  [phone]  CC: Primary care physician; No PCP Per Patient  Note: This dictation was prepared with Dragon dictation along with smaller phrase technology. Any transcriptional errors that result from this process are unintentional.

## 2015-10-11 NOTE — Progress Notes (Signed)
Patient switched from fentanyl gtt to precedex gtt/prn versed IVP.  CRRT continues to cycle, though blood flow rate has been decreased to 290 d/t increasing venous pressures.  Patient has had accessory muscle use on vent entire shift, with prolonged expiratory phase, Dr Dema Severin made aware of this.  Palliative care suggested to Dr Dema Severin, deferred at this time.  Patient became hypotensive when precedex first started, precedex paused until bp normalized.  Stool sent down for hemoccult.  Platelet count is 40.  MD Mungal made aware.  Heparin antibody sent to lab.  Awaiting results/

## 2015-10-11 NOTE — Progress Notes (Signed)
PHARMACY - CRITICAL CARE PROGRESS NOTE  Pharmacy Consult for Constipation Prevention.  Indication: ICU Status   No Known Allergies  Patient Measurements: Height:  (175.3 cm) Weight: (!) 637 lb 9.1 oz (289.2 kg) IBW/kg (Calculated) : 70.7   Vital Signs: Temp: 98.3 F (36.8 C) (01/23 0800) Temp Source: Oral (01/23 0800) BP: 95/57 mmHg (01/23 1000) Pulse Rate: 94 (01/23 1000) Intake/Output from previous day: 01/22 0701 - 01/23 0700 In: 2065.7 [I.V.:325.7; NG/GT:1740] Out: 2458 [Urine:66] Intake/Output from this shift: Total I/O In: 255 [I.V.:45; NG/GT:210] Out: 250 [Other:250] Vent settings for last 24 hours: Vent Mode:  [-] PCV FiO2 (%):  [40 %-60 %] 60 % Set Rate:  [20 bmp] 20 bmp PEEP:  [5 cmH20] 5 cmH20  Labs:  Recent Labs  10/09/15 0319 10/09/15 0444 10/09/15 0809 10/09/15 1737  10/10/15 0421 10/10/15 0539 10/10/15 1238 10/10/15 1811 10/10/15 1938 10/11/15 0429 10/11/15 0626  WBC 8.4  --   --   --   --  9.5  --   --   --   --  10.2  --   HGB 10.2*  --   --   --   --  9.8*  --   --   --   --  9.9*  --   HCT 33.7*  --   --   --   --  33.3*  --   --   --   --  33.6*  --   PLT 58*  --   --   --   --  39*  --   --   --   --  40*  --   APTT  --  65*  --   --   --  68*  --   --   --   --  63*  --   CREATININE 2.65*  --  2.79* 2.69*  < > 2.42* 2.51* 2.31*  --  2.36* 2.11*  --   MG 2.2  --  2.1 2.1  --   --  2.1  --  2.1  --   --  2.1  PHOS 3.3  --  3.2 4.1  --   --  3.0  --   --   --   --   --   ALBUMIN 3.0*  --  3.2* 3.3*  --   --  3.1*  --   --   --  3.0*  --   PROT  --   --   --   --   --   --   --   --   --   --  6.9  --   AST  --   --   --   --   --   --   --   --   --   --  176*  --   ALT  --   --   --   --   --   --   --   --   --   --  60  --   ALKPHOS  --   --   --   --   --   --   --   --   --   --  103  --   BILITOT  --   --   --   --   --   --   --   --   --   --  3.8*  --   < > =  values in this interval not displayed. Estimated Creatinine  Clearance: 103 mL/min (by C-G formula based on Cr of 2.11).   Recent Labs  10/10/15 2006 10/11/15 0052 10/11/15 0725  GLUCAP 88 101* 100*    Microbiology:   Medications:  Scheduled:  . amiodarone  200 mg Per Tube BID  . antiseptic oral rinse  7 mL Mouth Rinse 10 times per day  . budesonide  0.25 mg Nebulization 4 times per day  . chlorhexidine gluconate  15 mL Mouth Rinse BID  . docusate  100 mg Oral BID  . famotidine  20 mg Per Tube QODAY  . feeding supplement (PRO-STAT SUGAR FREE 64)  30 mL Per Tube TID  . feeding supplement (VITAL HIGH PROTEIN)  1,000 mL Per Tube Q24H  . free water  30 mL Per Tube 6 times per day  . Influenza vac split quadrivalent PF  0.5 mL Intramuscular Tomorrow-1000  . ipratropium-albuterol  3 mL Nebulization Q6H  . nystatin   Topical BID  . PARoxetine  40 mg Oral Daily  . sennosides  5 mL Oral BID   Infusions:  . fentaNYL infusion INTRAVENOUS 200 mcg/hr (10/11/15 1145)  . norepinephrine (LEVOPHED) Adult infusion Stopped (10/10/15 2121)  . pureflow 2,500 mL/hr at 10/11/15 1145    Assessment: Pharmacy consulted for constipation management for 40 yo male ICU patient requiring mechanical ventilation.     Plan:  Patient with last BM 01/22. Will continue scheduled senna/docusate and Miralax daily prn and f/u AM.    Valentina Gu, RPh Clinical Pharmacist 10/11/2015,12:28 PM

## 2015-10-11 NOTE — Progress Notes (Signed)
Nutrition Follow-up   INTERVENTION:   EN: Continue current TF regimen as ordered. Will continue to follow and make recommendations accordingly   NUTRITION DIAGNOSIS:   Inadequate oral intake related to inability to eat as evidenced by NPO status. Being addressed with TF.  GOAL:   Patient will meet greater than or equal to 90% of their needs; ongoing  MONITOR:    (Energy Intake, Digestive System, Pulmonary Profile, Electrolyte and Renal Profile, Anthropometrics)  REASON FOR ASSESSMENT:   Ventilator, Consult Enteral/tube feeding initiation and management  ASSESSMENT:   Pt admitted with acute hypoxic and hypercapnic resp failure with acute metabolic encephalopathy from cocaine abuse leading to CHF and encephalopathy per MD note. Pt intubated this am.  Pt remains intubated on the vent via trach, currently on CRRT to be continued until cartridge expires and then will reassess per Nephrology.  Diet Order:    NPO pt remains with NG tube in place  Current EN: Vital high Protein at 37mL/hr with free water of 30mL q 4 hours   Gastrointestinal Profile: Last BM: 10/10/2015  Ouptut in last 24 hours: UF  UOP: 66mL   Scheduled Medications:  . amiodarone  200 mg Per Tube BID  . antiseptic oral rinse  7 mL Mouth Rinse 10 times per day  . budesonide  0.25 mg Nebulization 4 times per day  . chlorhexidine gluconate  15 mL Mouth Rinse BID  . docusate  100 mg Oral BID  . famotidine  20 mg Per Tube QODAY  . feeding supplement (PRO-STAT SUGAR FREE 64)  30 mL Per Tube TID  . feeding supplement (VITAL HIGH PROTEIN)  1,000 mL Per Tube Q24H  . free water  30 mL Per Tube 6 times per day  . Influenza vac split quadrivalent PF  0.5 mL Intramuscular Tomorrow-1000  . ipratropium-albuterol  3 mL Nebulization Q6H  . nystatin   Topical BID  . PARoxetine  40 mg Oral Daily  . sennosides  5 mL Oral BID    Continuous Medications:  . fentaNYL infusion INTRAVENOUS 200 mcg/hr (10/11/15 1145)   . norepinephrine (LEVOPHED) Adult infusion Stopped (10/10/15 2121)  . pureflow 2,500 mL/hr at 10/11/15 1145     Electrolyte/Renal Profile and Glucose Profile:   Recent Labs Lab 10/09/15 0809 10/09/15 1737  10/10/15 0539 10/10/15 1238 10/10/15 1811 10/10/15 1938 10/11/15 0429 10/11/15 0626  NA 142 141  < > 140 140  --  139 141  --   K 4.6 5.0  < > 4.5 4.3  --  4.2 4.2  --   CL 106 104  < > 104 105  --  104 105  --   CO2 27 28  < > 29 27  --  28 29  --   BUN 74* 73*  < > 68* 64*  --  64* 60*  --   CREATININE 2.79* 2.69*  < > 2.51* 2.31*  --  2.36* 2.11*  --   CALCIUM 9.1 9.2  < > 9.0 9.1  --  9.0 9.2  --   MG 2.1 2.1  --  2.1  --  2.1  --   --  2.1  PHOS 3.2 4.1  --  3.0  --   --   --   --   --   GLUCOSE 119* 128*  < > 128* 106*  --  103* 115*  --   < > = values in this interval not displayed. Protein Profile:  Recent Labs Lab  10/09/15 1737 10/10/15 0539 10/11/15 0429  ALBUMIN 3.3* 3.1* 3.0*     Weight Trend since Admission: Filed Weights   10/09/15 0500 10/10/15 0630 10/11/15 0500  Weight: 644 lb 10 oz (292.4 kg) 639 lb (289.848 kg) 637 lb 9.1 oz (289.2 kg)   RD notes weight decreasing   Skin:  Reviewed, no issues  BMI:  Body mass index is 94.11 kg/(m^2).  Estimated Nutritional Needs:   Kcal:  1600-1817kcals (22-25kcals/kg) using  IBW of 72.7kg  Protein:  145-181g protein (2.0-2.5g/kg) using IBW of 72.7kg  Fluid:  2181-2512mL of fluid (30-54mL/kg)  EDUCATION NEEDS:   No education needs identified at this time   HIGH Care Level  Leda Quail, RD, LDN Pager [phone] Weekend/On-Call Pager [phone]

## 2015-10-11 NOTE — Progress Notes (Signed)
CRRT cartridge clotted off and CRRT stopped per MD order. MD will reassess Pt in AM per MD note. Pt's catheter packed with heparin and clamped. Will continue to strictly monitor Pt's I/O's

## 2015-10-12 ENCOUNTER — Inpatient Hospital Stay (HOSPITAL_COMMUNITY)
Admit: 2015-10-12 | Discharge: 2015-10-12 | Disposition: A | Discharge status: Home / Self Care | Payer: Medicaid Other | Attending: Student | Admitting: Student

## 2015-10-12 DIAGNOSIS — Z4659 Encounter for fitting and adjustment of other gastrointestinal appliance and device: Secondary | ICD-10-CM | POA: Insufficient documentation

## 2015-10-12 DIAGNOSIS — I509 Heart failure, unspecified: Secondary | ICD-10-CM

## 2015-10-12 DIAGNOSIS — J0410 Acute tracheitis without obstruction: Secondary | ICD-10-CM

## 2015-10-12 DIAGNOSIS — R451 Restlessness and agitation: Secondary | ICD-10-CM

## 2015-10-12 DIAGNOSIS — J398 Other specified diseases of upper respiratory tract: Secondary | ICD-10-CM

## 2015-10-12 LAB — CBC
HCT: 33.5 % — ABNORMAL LOW (ref 40.0–52.0)
HCT: 34.4 % — ABNORMAL LOW (ref 40.0–52.0)
Hemoglobin: 9.8 g/dL — ABNORMAL LOW (ref 13.0–17.0)
Hemoglobin: 10.1 g/dL — ABNORMAL LOW (ref 13.0–18.0)
MCH: 25.3 pg — ABNORMAL LOW (ref 26.0–34.0)
MCH: 26 pg (ref 26.0–34.0)
MCHC: 29.1 g/dL — AB (ref 32.0–36.0)
MCHC: 29.4 g/dL — ABNORMAL LOW (ref 32.0–36.0)
MCV: 87 fL (ref 80.0–100.0)
MCV: 88.4 fL (ref 80.0–100.0)
PLATELETS: 26 10*3/uL — AB (ref 150–400)
PLATELETS: 32 10*3/uL — AB (ref 150–440)
RBC: 3.85 MIL/uL — ABNORMAL LOW (ref 4.40–5.90)
RBC: 3.89 MIL/uL — AB (ref 4.40–5.90)
RDW: 23 % — AB (ref 11.5–14.5)
RDW: 23.7 % — AB (ref 11.5–14.5)
WBC: 11.9 10*3/uL — AB (ref 3.8–10.6)
WBC: 14.1 10*3/uL — ABNORMAL HIGH (ref 3.8–10.6)

## 2015-10-12 LAB — BASIC METABOLIC PANEL
ANION GAP: 9 (ref 5–15)
BUN: 67 mg/dL — ABNORMAL HIGH (ref 6–20)
CHLORIDE: 104 mmol/L (ref 101–111)
CO2: 25 mmol/L (ref 22–32)
Calcium: 9.1 mg/dL (ref 8.9–10.3)
Creatinine, Ser: 2.36 mg/dL — ABNORMAL HIGH (ref 0.61–1.24)
GFR calc Af Amer: 38 mL/min — ABNORMAL LOW (ref >60)
GFR calc non Af Amer: 33 mL/min — ABNORMAL LOW (ref >60)
GLUCOSE: 116 mg/dL — AB (ref 65–99)
POTASSIUM: 4.5 mmol/L (ref 3.5–5.1)
Sodium: 138 mmol/L (ref 135–145)

## 2015-10-12 LAB — BLOOD GAS, ARTERIAL
ACID-BASE EXCESS: 0.6 mmol/L (ref 0.0–3.0)
Allens test (pass/fail): POSITIVE — AB
BICARBONATE: 27.9 meq/L (ref 21.0–28.0)
FIO2: 0.5
O2 Saturation: 89.7 %
PATIENT TEMPERATURE: 37
PEEP/CPAP: 5 cmH2O
PH ART: 7.29 — AB (ref 7.350–7.450)
PO2 ART: 65 mmHg — AB (ref 83.0–108.0)
PRESSURE CONTROL: 25 cmH2O
RATE: 20 resp/min
pCO2 arterial: 58 mmHg — ABNORMAL HIGH (ref 32.0–48.0)

## 2015-10-12 LAB — HEPARIN INDUCED PLATELET AB (HIT ANTIBODY): Heparin Induced Plt Ab: 0.466 OD — ABNORMAL HIGH (ref 0.000–0.400)

## 2015-10-12 LAB — APTT: aPTT: 68 seconds — ABNORMAL HIGH (ref 24–36)

## 2015-10-12 LAB — GLUCOSE, CAPILLARY
GLUCOSE-CAPILLARY: 110 mg/dL — AB (ref 65–99)
Glucose-Capillary: 94 mg/dL (ref 65–99)
Glucose-Capillary: 105 mg/dL — ABNORMAL HIGH (ref 65–99)

## 2015-10-12 MED ORDER — VITAL HIGH PROTEIN PO LIQD
1000.0000 mL | ORAL | Status: DC
  Administered 2015-10-12: 1000 mL

## 2015-10-12 MED ORDER — PANTOPRAZOLE SODIUM 40 MG IV SOLR
40.0000 mg | Freq: Two times a day (BID) | INTRAVENOUS | Status: DC
  Administered 2015-10-12 – 2015-10-14 (×5): 40 mg via INTRAVENOUS
  Filled 2015-10-12 (×5): qty 40

## 2015-10-12 MED ORDER — VECURONIUM BROMIDE 10 MG IV SOLR
10.0000 mg | Freq: Once | INTRAVENOUS | Status: AC
  Administered 2015-10-12: 10 mg via INTRAVENOUS
  Filled 2015-10-12: qty 10

## 2015-10-12 MED ORDER — ALBUMIN HUMAN 25 % IV SOLN
12.5000 g | Freq: Once | INTRAVENOUS | Status: AC
  Administered 2015-10-12: 12.5 g via INTRAVENOUS
  Filled 2015-10-12: qty 50

## 2015-10-12 MED ORDER — TUBERCULIN PPD 5 UNIT/0.1ML ID SOLN
5.0000 [IU] | Freq: Once | INTRADERMAL | Status: AC
  Administered 2015-10-12: 5 [IU] via INTRADERMAL
  Filled 2015-10-12: qty 0.1

## 2015-10-12 MED ORDER — FAMOTIDINE 20 MG PO TABS: 20.0000 mg | ORAL_TABLET | Freq: Every day | ORAL | Status: DC

## 2015-10-12 MED ORDER — FENTANYL CITRATE (PF) 100 MCG/2ML IJ SOLN
25.0000 ug | INTRAMUSCULAR | Status: DC | PRN
Start: 2015-10-12 — End: 2015-10-19
  Administered 2015-10-12 – 2015-10-14 (×3): 50 ug via INTRAVENOUS
  Filled 2015-10-12 (×3): qty 2

## 2015-10-12 MED ORDER — ALPRAZOLAM 0.5 MG PO TABS
0.5000 mg | ORAL_TABLET | Freq: Three times a day (TID) | ORAL | Status: DC
  Administered 2015-10-12 – 2015-10-18 (×19): 0.5 mg via ORAL
  Filled 2015-10-12 (×20): qty 1

## 2015-10-12 MED ORDER — DEXTROSE 5 % IV SOLN
2.0000 g | Freq: Three times a day (TID) | INTRAVENOUS | Status: DC
  Administered 2015-10-12 – 2015-10-13 (×3): 2 g via INTRAVENOUS
  Filled 2015-10-12 (×5): qty 2

## 2015-10-12 NOTE — Procedures (Signed)
Date: 10/12/2015,     PHYSICIAN:  Carmin Alvidrez  Indications/Preliminary Diagnosis: persistent thick secretions, hypotension  Consent: (Place X beside choice/s below)  The benefits, risks and possible complications of the procedure were        explained to:  ___ patient  _x__ patient's family (mother)  ___ other:___________  who verbalized understanding and gave:  ___ verbal  ___ written  ___ verbal and written  __x_ telephone  ___ other:________ consent.      Unable to obtain consent; procedure performed on emergent basis.     Other:       PRESEDATION ASSESSMENT: History and Physical has been performed. Patient meds and allergies have been reviewed. Presedation airway examination has been performed and documented. Baseline vital signs, sedation score, oxygenation status, and cardiac rhythm were reviewed. Patient was deemed to be in satisfactory condition to undergo the procedure.  PREMEDICATIONS:   Sedative/Narcotic Amt Dose   Versed  mg   Fentanyl  mcg  Diprivan  mg        Airway Prep (Place X beside choice below)   1% Transtracheal Lidocaine Anesthetization 7 cc   Patient prepped per Bronchoscopy Lab Policy       Insertion Route (Place X beside choice below)   Nasal   Oral   Endotracheal Tube  x Tracheostomy   INTRAPROCEDURE MEDICATIONS:  Sedative/Narcotic Amt Dose   Versed  mg   Fentanyl  mcg  Diprivan  mg       Medication Amt Dose  Medication Amt Dose  Xylocaine 2%  cc  Epinephrine 1:10,000 sol  cc  Xylocaine 4%  cc  Cocaine  cc   TECHNICAL PROCEDURES: (Place X beside choice below)   Procedures  Description    None     Electrocautery     Cryotherapy     Balloon Dilatation     Bronchography     Stent Placement   x  Therapeutic Aspiration     Laser/Argon Plasma    Brachytherapy Catheter Placement    Foreign Body Removal     SPECIMENS (Sites): (Place X beside choice below)  Specimens Description   No Specimens Obtained     Washings   x  Lavage RML   Biopsies    Fine Needle Aspirates    Brushings    Sputum    FINDINGS:  Trachea - erythematous mucosa with thick purulent looking secretions  Right Airway - RMS and carina to RUL with thick purulent looking secretions, mild to moderate mucosal edema and erythema  Left Airway - LMS with mild thick purulent secretions, mild to moderate mucosal edema and erythema    ESTIMATED BLOOD LOSS: none  COMPLICATIONS/RESOLUTION: none  PROCEDURE DETAILS: Timeout performed and correct patient, name, & ID confirmed. Following prep per Pulmonary policy, appropriate sedation was administered.  Airway exam proceeded with findings, technical procedures, and specimen collection as noted below. At the end of exam the scope was withdrawn without incident. Impression and Plan as noted below.   Inspection:see above  Procedure: RML BAL - cloudy, 20cc  IMPLANTED DEVICE(S):none  IMPRESSION:POST-PROCEDURE NF:AOZH  RECOMMENDATION/PLAN:  Cont with vent support Start antibiotics for possible Tracheitis   ADDITIONAL COMMENTS:   Procedure Time:about 10 mins   Stephanie Acre, MD [facility] Pager : [phone] (Please enter 7 [phone]) On call pager [phone] (please enter 7-digits) Clinic - [phone]

## 2015-10-12 NOTE — Progress Notes (Signed)
Spoke with Dr. Dema Severin at bedside. Pt continuously has increased work of breathing since Bronchoscopy this morning. Even after giving PRN versed, pt has not experienced any relief. Per Dr. Dema Severin, give 10 mg of Vecuronium. After Vecuronium pt became synchronous with vent but sats began to drop since pt was not receiving the needed tidal volume. Pt bagged with Ambu bag for 5 minutes then placed back on set rate on ventilator.

## 2015-10-12 NOTE — Progress Notes (Signed)
Nutrition Follow-up    INTERVENTION:   EN: continue TF as tolerated by pt at current rate as per order set  NUTRITION DIAGNOSIS:   Inadequate oral intake related to inability to eat as evidenced by NPO status.  GOAL:   Patient will meet greater than or equal to 90% of their needs  MONITOR:    (Energy Intake, Digestive System, Pulmonary Profile, Electrolyte and Renal Profile, Anthropometrics)  REASON FOR ASSESSMENT:   Ventilator, Consult Enteral/tube feeding initiation and management  ASSESSMENT:   Pt remains on vent via trach, off CRRT, currently off levophed  EN: tolerating Vital High Protein at rate of 60 ml/hr, Prostat TID  Skin:  Reviewed, no issues  Last BM:  1/23   Electrolyte and Renal Profile:  Recent Labs Lab 10/09/15 0809 10/09/15 1737  10/10/15 0539  10/10/15 1811  10/11/15 0626 10/11/15 1336 10/11/15 2006 10/12/15 0501  BUN 74* 73*  < > 68*  < >  --   < >  --  55* 55* 67*  CREATININE 2.79* 2.69*  < > 2.51*  < >  --   < >  --  2.07* 2.09* 2.36*  NA 142 141  < > 140  < >  --   < >  --  139 139 138  K 4.6 5.0  < > 4.5  < >  --   < >  --  4.3 4.2 4.5  MG 2.1 2.1  --  2.1  --  2.1  --  2.1  --  2.0  --   PHOS 3.2 4.1  --  3.0  --   --   --   --   --   --   --   < > = values in this interval not displayed. Glucose Profile:  Recent Labs  10/11/15 0725 10/11/15 1623 10/12/15 0730  GLUCAP 100* 128* 94   Meds: reviewed  Height:   Ht Readings from Last 1 Encounters:  09/20/15  (1.753 m)    Weight:   Wt Readings from Last 1 Encounters:  10/12/15 637 lb 9.6 oz (289.213 kg)    Filed Weights   10/10/15 0630 10/11/15 0500 10/12/15 0500  Weight: 639 lb (289.848 kg) 637 lb 9.1 oz (289.2 kg) 637 lb 9.6 oz (289.213 kg)    BMI:  Body mass index is 94.11 kg/(m^2).  Estimated Nutritional Needs:   Kcal:  1600-1817kcals (22-25kcals/kg) using  IBW of 72.7kg  Protein:  145-181g protein (2.0-2.5g/kg) using IBW of 72.7kg  Fluid:   2181-2560mL of fluid (30-67mL/kg)  EDUCATION NEEDS:   No education needs identified at this time  HIGH Care Level  Romelle Starcher MS, RD, LDN [phone] Pager  [phone] Weekend/On-Call Pager

## 2015-10-12 NOTE — Progress Notes (Signed)
eLink Physician-Brief Progress Note Patient Name: Mitchell Morrow DOB: Apr 05, 1976 MRN: [id number]   Date of Service  10/12/2015  HPI/Events of Note  Platelet count now = 26K which has decreased from 40K. Heparin [place] has been stopped and HIT Antibody is pending. No evidence of bleeding.  eICU Interventions  Await HIT Antibody study results.      Intervention Category Intermediate Interventions: Thrombocytopenia - evaluation and management  [name] 10/12/2015, 5:59 AM

## 2015-10-12 NOTE — Progress Notes (Signed)
[facility] - [place] [facility]   PATIENT NAME: Mitchell Morrow    MR#:  [id number]  DATE OF BIRTH:  1975-12-28  SUBJECTIVE:  Patient remains on the ventilator. He is off of CRRT. No change in mental status.  REVIEW OF SYSTEMS:   Review of Systems  Unable to perform ROS: critical illness    DRUG ALLERGIES:  No Known Allergies  VITALS:  Blood pressure 84/61, pulse 85, temperature 100.4 F (38 C), temperature source Oral, resp. rate 24, height  (1.753 m), weight 289.213 kg (637 lb 9.6 oz), SpO2 99 %.  PHYSICAL EXAMINATION:   Physical Exam  Constitutional: He is well-developed, well-nourished, and in no distress. No distress.  Morbidly obese  HENT:  Head: Normocephalic.  Eyes: No scleral icterus.  Neck: Normal range of motion. Neck supple. No JVD present. No tracheal deviation present.  Tracheostomy placed  Cardiovascular: Normal rate, regular rhythm and normal heart sounds.  Exam reveals no gallop and no friction rub.   No murmur heard. Pulmonary/Chest: Effort normal and breath sounds normal. No respiratory distress. He has no wheezes. He has no rales. He exhibits no tenderness.  Abdominal: Soft. Bowel sounds are normal. He exhibits no distension and no mass. There is no tenderness. There is no rebound and no guarding.  Musculoskeletal: Normal range of motion. He exhibits no edema.  Neurological:  Eyes open but patient does not follow commands  Skin: Skin is warm. No rash noted. No erythema.  Psychiatric: Judgment normal.     s.   LABORATORY PANEL:   CBC  Recent Labs Lab 10/12/15 0501  WBC 11.9*  HGB 9.8*  HCT 33.5*  PLT 26*   ------------------------------------------------------------------------------------------------------------------  Chemistries   Recent Labs Lab 10/11/15 0429  10/11/15 2006 10/12/15 0501  NA 141  < > 139 138  K 4.2  < > 4.2 4.5  CL 105  < > 104 104  CO2 29  < > 27 25  GLUCOSE 115*  < > 135*  116*  BUN 60*  < > 55* 67*  CREATININE 2.11*  < > 2.09* 2.36*  CALCIUM 9.2  < > 9.1 9.1  MG  --   < > 2.0  --   AST 176*  --   --   --   ALT 60  --   --   --   ALKPHOS 103  --   --   --   BILITOT 3.8*  --   --   --   < > = values in this interval not displayed. ------------------------------------------------------------------------------------------------------------------  Cardiac Enzymes No results for input(s): TROPONINI in the last 168 hours. ------------------------------------------------------------------------------------------------------------------  RADIOLOGY:  Dg Chest Port 1 View  10/11/2015  CLINICAL DATA:  Respiratory failure. EXAM: PORTABLE CHEST 1 VIEW COMPARISON:  10/09/2015 FINDINGS: Study limited by body habitus and portable nature of the study. Tracheostomy and right central line are unchanged. Cardiomegaly with bilateral airspace opacities, worsening since prior study, likely worsening edema. Probable superimposed bibasilar atelectasis. Cannot exclude small effusions. IMPRESSION: Worsening bilateral airspace disease, likely a combination of worsening edema/ CHF and bibasilar atelectasis. Possible small layering effusions. Electronically Signed   By: Charlett Nose M.D.   On: 10/11/2015 07:26    ASSESSMENT AND PLAN:   40 year old male with a history of morbid obesity and tobacco abuse who presented with lower extremity edema and found to have bilateral pneumonia.  1. Acute hypoxic hypercapnic respiratory failure-: This is multifactorial  due to obesity hypoventilation syndrome,  community-acquired pneumonia and pulmonary edema versus ARDS.  Patient is Intubated and on ventilator. Tracheostomy done on 09/28/2015. Continue vent management as per pulmonary Patient has completed course of antibiotics for treatment of pneumonia with vancomycin and CEFTAZ. He is currently on fentanyl and Precedex for sedation.  2. Bilateral community-acquired pneumonia with septic shock He  was on vancomycin and ceftazidime and he had completed course of treatment.   He was restarted levophed due to hypotension once CRRT was started. He is currently off of pressors.   3.New onset systolic congestive heart failure,  EF 45%  - had elevated BNP, edema and vascular congestion on chest x-ray  Patient is responding well to CRRT  4. Acute renal failure - Due to ATN and diuresis Appreciate nephrology consult Due to persistent oliguria patient was started on  CRRT on 10/06/15. He is now off of CRRT and nephrology will plan on intermittent hemodialysis. Patient is unable to tolerate this then we'll need to go back to CRRT. Marland Kitchen Does not seem to be a good candidate for outpatient hemodialysis at this time  5. Nutrition-Currently has an NG tube with tube feeds. Will need a PEG tube. Due to his large body habitus,GI cannot do it endoscopically and as per IR team, patient will be too heavy for a CT table and also the fluoroscopic table due to his weight. So cannot be done by interventional radiology.  Patient will need to have an open procedure for his PEG tube placement by surgical team, however will be a high risk procedure considering his body habitus and also his other multiple medical problems. This will need to be done at bariatric center or teritiary care center- not accepted at [facility] [place] or [facility] at this time as per Dr Nemiah Commander. Continue on NG tube at this time.   6 hyperbilirubinemia: On admission could be passive congestion from congestive heart failure. hepatitis panel and ultrasound of right upper quadrant suggestive of cirrhotic changes. US showed Cirrhosis  7. Atrial fibrillation, -known history of paroxysmal atrial fibrillation. Continue on oral amidarone. I will change dose to 200 mg twice a day Appreciate cardiology consult. Risk of long-term full dose anticoagulation risks outweighs benefit at this time as per cardiac.  8. Anemia of chronic disease and illness: Continue to  monitor hemoglobin. No indication for blood transfusion. 9. Depression: Patient needs Paxil.  CODE STATUS: FULL  TOTAL CRITICAL CARE TIME SPENT IN TAKING CARE OF THIS PATIENT: 30 critical care minutes.  He remains critically ill and high risk for cardiopulmonary arrest.     Andreea Arca M.D on 10/12/2015   Between 7am to 6pm - Pager - [phone]  After 6pm go to [email or web address] - password [email or web address]  [facility]  Office  [phone]  CC: Primary care physician; No PCP Per Patient  Note: This dictation was prepared with Dragon dictation along with smaller phrase technology. Any transcriptional errors that result from this process are unintentional.

## 2015-10-12 NOTE — Progress Notes (Signed)
ELink notified of critical platelets value of 26

## 2015-10-12 NOTE — Progress Notes (Signed)
PULMONARY/CCM PROGRESS NOTE  40 year old massively obese male with acute CHF, volume overload. Now with acute renal failure, vent dependent respiratory failure status post trach.    Lines, Tubes, etc: ETT 01/02 >> . Patient self extubated, subsequently reintubated 09/25/2015 L IJ CVL 01/02 >>   Microbiology: Blood 12/30 >>  MRSA PCR 01/02 >> NEG Urine 01/04 >> NEG Blood 01/04 >> no growth BAL 1/24>>  PCT 01/04: 0.74  Antibiotics:  Levofloxacin 12/30 >> 01/01 Ceftriaxone 01/02 >> 01/02 Azithro 01/02 >> 01/02 Levofloxacin 01/04 >> 01/05 Vanc 01/05 >> 1/9 Ceftaz 01/05 >>1/9   PCT 01/04: 0.74, 0.95, 0.77  Studies/Events: 12/31 TTE:  poor quality study, LVEF 45% 12/31 RUQ: Nodular contour the liver raising the question of cirrhosis. Gallbladder wall thickening, nonspecific in appearance 01/04 night - fever, hypotension. Cultures obtained. Abx expanded -failed self extubation 1/24 Bronchoscopy - thick purulent secretions, mainly in the upper airways, mucosal edema and erythema noted, BAL on RML.   Best Practice: DVT: enoxaparin SUP: enteral famotidine Nutrition: TFs    Subj: Patient sedated, PAD regiment changed yesterday.  Plts down this AM.  CRRT stopped.  Required low dose levophed lastnight for a short period of time.  Obj: Filed Vitals:   10/12/15 0600 10/12/15 0700 10/12/15 0800 10/12/15 0900  BP: 96/65 117/82 97/59 100/58  Pulse: 107 117 88 86  Temp: 99.2 F (37.3 C)     TempSrc: Axillary     Resp: Height:      Weight:      SpO2: 94% 87% 96% 95%    Gen: morbidly obese HEENT:  WNL Neck: CVL site clean, JVP cannot be assessed Chest: clear today somewhat prolonged exp time Cardiac: distant HS, regular, no M noted Abd: obese, soft, + BS Ext: symmetric edema, chronic stasis changes Neuro: GCS<10T  BMET    Component Value Date/Time   NA 138 10/12/2015 0501   K 4.5 10/12/2015 0501   CL 104 10/12/2015 0501   CO2 25 10/12/2015 0501   GLUCOSE 116* 10/12/2015 0501   BUN 67* 10/12/2015 0501   CREATININE 2.36* 10/12/2015 0501   CALCIUM 9.1 10/12/2015 0501   GFRNONAA 33* 10/12/2015 0501   GFRAA 38* 10/12/2015 0501    CBC    Component Value Date/Time   WBC 11.9* 10/12/2015 0501   RBC 3.85* 10/12/2015 0501   HGB 9.8* 10/12/2015 0501   HCT 33.5* 10/12/2015 0501   PLT 26* 10/12/2015 0501   MCV 87.0 10/12/2015 0501   MCH 25.3* 10/12/2015 0501   MCHC 29.1* 10/12/2015 0501   RDW 23.0* 10/12/2015 0501   LYMPHSABS 0.8* 10/07/2015 1116   MONOABS 1.4* 10/07/2015 1116   EOSABS 0.5 10/07/2015 1116   BASOSABS 0.1 10/07/2015 1116    CXR:  continued atelectasis in the right lung apex. Persistent bibasilar atelectasis and effusions   IMPRESSION: -- Acute on chronic hypercarbic respiratory failure , pulmonary edema.  S/P trach tube placement 09-Oct-2015 -- Obesity hypoventilation syndrome -- Atelectasis -- Cardiomyopathy - LVEF 45% 09/18/15 -- Shock - suspect septic -- AKI, oliguric. CRRT initiated 1/18.  R IJ temporary HD catheter 10/06/15 -- Severe hypervolemia  -- Resolving severe sepsis - monitoring off abx presently -- ICU acquired anemia -- New thrombocytopenia - SQ heparin DC'd 01/22, on CRRT >>stopped 1/24 -- ICU associated delirium -- Intermittent tearfulness, situational depression --Tracheitis  PLAN/RECS:  Cont full vent support - settings reviewed and/or adjusted  Better synchrony in PCV mode Cont vent bundle Daily SBT if/when meets  criteria Reassess cardia Fcn, may need to repeat ECHO  Wean norepinephrine to off for MAP > 65 mmHg Monitor BMET intermittently Monitor I/Os Correct electrolytes as indicated CRRT per Renal, stopped on 1/24, Renal will start HD Monitor Plts closely, still down, CRRT stopped.  Plt currently >20K DVT px: SCDs Monitor CBC intermittently  Keep eye on plt counts Transfuse per usual ICU guidelines Monitoring off abx presently Wean of precedex due to persistent hypotension,  PRN fentanyl and Versed, low dose Xanax Cont SSRI Start cefepime then deescalate base on BAL results.    CCM time: 45 mins The above time includes time spent in consultation with patient and/or family members and reviewing care plan on multidisciplinary rounds  Stephanie Acre, MD [facility] Pager [phone] (please enter 7-digits) On Call Pager - [phone] (please enter 7-digits)

## 2015-10-12 NOTE — Progress Notes (Signed)
[facility]  ROUNDING NOTE   Subjective:   Off CRRT since 10pm. Required norepinephrine overnight.  UOP 35 UF 1638  Platelets 36 (40)  Objective:  Vital signs in last 24 hours:  Temp:  [98.2 F (36.8 C)-99.3 F (37.4 C)] 99.2 F (37.3 C) (01/24 0600) Pulse Rate:  [74-117] 86 (01/24 0900) Resp:  [15-26] 25 (01/24 0900) BP: (79-131)/(40-86) 100/58 mmHg (01/24 0900) SpO2:  [87 %-99 %] 95 % (01/24 0900) FiO2 (%):  [50 %-60 %] 50 % (01/24 0321) Weight:  [289.213 kg (637 lb 9.6 oz)] 289.213 kg (637 lb 9.6 oz) (01/24 0500)  Weight change: 0.013 kg (0.5 oz) Filed Weights   10/10/15 0630 10/11/15 0500 10/12/15 0500  Weight: 289.848 kg (639 lb) 289.2 kg (637 lb 9.1 oz) 289.213 kg (637 lb 9.6 oz)    Intake/Output: I/O last 3 completed shifts: In: 3082.8 [I.V.:652.8; NG/GT:2430] Out: 3015 [Urine:66; Other:2949]   Intake/Output this shift:  Total I/O In: 111.7 [I.V.:21.7; NG/GT:90] Out: -   Physical Exam: General: Critically ill  Head: [place]/AT difficult to assess hearing  Eyes: Mild icterus noted  Neck: Tracheostomy in place  Lungs:  Bilateral rhonchi, vent assisted, Pressure Control Fio2 50%  Heart: S1S2 no rubs  Abdomen:  Soft, nontender, obese  Extremities: 3+ generalized edema.  Neurologic: Awake, will turn head on command  Skin: No lesions  Access Right IJ temp HD catheter 1/18 Dr. Gilda Crease  GU: Foley    Basic Metabolic Panel:  Recent Labs Lab 10/08/15 2332 10/09/15 0319 10/09/15 0809 10/09/15 1737  10/10/15 2725  10/10/15 1811 10/10/15 1938 10/11/15 0429 10/11/15 0626 10/11/15 1336 10/11/15 2006 10/12/15 0501  NA 141 141 142 141  < > 140  < >  --  139 141  --  139 139 138  K 4.6 4.6 4.6 5.0  < > 4.5  < >  --  4.2 4.2  --  4.3 4.2 4.5  CL 104 104 106 104  < > 104  < >  --  104 105  --  102 104 104  CO2 < > 29  < >  --  28 29  --  GLUCOSE 134* 131* 119* 128*  < > 128*  < >  --  103* 115*  --  115* 135* 116*  BUN 80*  77* 74* 73*  < > 68*  < >  --  64* 60*  --  55* 55* 67*  CREATININE 2.73* 2.65* 2.79* 2.69*  < > 2.51*  < >  --  2.36* 2.11*  --  2.07* 2.09* 2.36*  CALCIUM 9.1 9.0 9.1 9.2  < > 9.0  < >  --  9.0 9.2  --  9.2 9.1 9.1  MG 2.3 2.2 2.1 2.1  --  2.1  --  2.1  --   --  2.1  --  2.0  --   PHOS 3.4 3.3 3.2 4.1  --  3.0  --   --   --   --   --   --   --   --   < > = values in this interval not displayed.  Liver Function Tests:  Recent Labs Lab 10/06/15 0500  10/09/15 0319 10/09/15 0809 10/09/15 1737 10/10/15 0539 10/11/15 0429  AST 41  --   --   --   --   --  176*  ALT 13*  --   --   --   --   --  60  ALKPHOS 74  --   --   --   --   --  103  BILITOT 3.6*  --   --   --   --   --  3.8*  PROT 7.0  --   --   --   --   --  6.9  ALBUMIN 2.9*  < > 3.0* 3.2* 3.3* 3.1* 3.0*  < > = values in this interval not displayed. No results for input(s): LIPASE, AMYLASE in the last 168 hours. No results for input(s): AMMONIA in the last 168 hours.  CBC:  Recent Labs Lab 10/07/15 1116 10/08/15 0346 10/09/15 0319 10/10/15 0421 10/11/15 0429 10/12/15 0501  WBC 9.0 9.0 8.4 9.5 10.2 11.9*  NEUTROABS 6.1  --   --   --   --   --   HGB 10.5* 10.4* 10.2* 9.8* 9.9* 9.8*  HCT 35.5* 34.5* 33.7* 33.3* 33.6* 33.5*  MCV 87.6 86.4 86.9 85.8 86.4 87.0  PLT 83* 72* 58* 39* 40* 26*    Cardiac Enzymes: No results for input(s): CKTOTAL, CKMB, CKMBINDEX, TROPONINI in the last 168 hours.  BNP: Invalid input(s): POCBNP  CBG:  Recent Labs Lab 10/10/15 2006 10/11/15 0052 10/11/15 0725 10/11/15 1623 10/12/15 0730  GLUCAP 88 101* 100* 128* 94    Microbiology: Results for orders placed or performed during the hospital encounter of 08/23/2015  Blood culture (routine x 2)     Status: None   Collection Time: 09/02/2015 11:53 PM  Result Value Ref Range Status   Specimen Description BLOOD LEFT HAND  Final   Special Requests BOTTLES DRAWN AEROBIC AND ANAEROBIC 4CC  Final   Culture NO GROWTH 5 DAYS  Final    Report Status 09/23/2015 FINAL  Final  Blood culture (routine x 2)     Status: None   Collection Time: 08/27/2015 11:53 PM  Result Value Ref Range Status   Specimen Description BLOOD LEFT ARM  Final   Special Requests BOTTLES DRAWN AEROBIC AND ANAEROBIC 5CC  Final   Culture NO GROWTH 5 DAYS  Final   Report Status 09/23/2015 FINAL  Final  Urine culture     Status: None   Collection Time: 09/18/15 12:21 AM  Result Value Ref Range Status   Specimen Description URINE, RANDOM  Final   Special Requests NONE  Final   Culture MULTIPLE SPECIES PRESENT, SUGGEST RECOLLECTION  Final   Report Status 09/19/2015 FINAL  Final  Rapid Influenza A&B Antigens ([facility] only)     Status: None   Collection Time: 09/18/15  6:26 AM  Result Value Ref Range Status   Influenza A ([facility]) NOT DETECTED  Final   Influenza B ([facility]) NOT DETECTED  Final  MRSA PCR Screening     Status: None   Collection Time: 09/20/15  6:30 AM  Result Value Ref Range Status   MRSA by PCR NEGATIVE NEGATIVE Final    Comment:        The GeneXpert MRSA Assay (FDA approved for NASAL specimens only), is one component of a comprehensive MRSA colonization surveillance program. It is not intended to diagnose MRSA infection nor to guide or monitor treatment for MRSA infections.   Culture, respiratory (NON-Expectorated)     Status: None   Collection Time: 09/22/15  3:13 PM  Result Value Ref Range Status   Specimen Description TRACHEAL ASPIRATE  Final   Special Requests NONE  Final   Gram Stain   Final    GOOD SPECIMEN - 80-90% WBCS  MODERATE WBC SEEN RARE YEAST RARE GRAM NEGATIVE COCCOBACILLI    Culture LIGHT GROWTH CANDIDA TROPICALIS  Final   Report Status 09/26/2015 FINAL  Final  Urine culture     Status: None   Collection Time: 09/22/15 11:16 PM  Result Value Ref Range Status   Specimen Description URINE, RANDOM  Final   Special Requests NONE  Final   Culture NO GROWTH 2 DAYS  Final   Report Status 09/24/2015 FINAL  Final   CULTURE, BLOOD (ROUTINE X 2) w Reflex to PCR ID Panel     Status: None   Collection Time: 09/22/15 11:44 PM  Result Value Ref Range Status   Specimen Description BLOOD RIGHT ANTECUBITAL  Final   Special Requests BOTTLES DRAWN AEROBIC AND ANAEROBIC  Final   Culture NO GROWTH 5 DAYS  Final   Report Status 09/28/2015 FINAL  Final  CULTURE, BLOOD (ROUTINE X 2) w Reflex to PCR ID Panel     Status: None   Collection Time: 09/22/15 11:56 PM  Result Value Ref Range Status   Specimen Description BLOOD LEFT HAND  Final   Special Requests BOTTLES DRAWN AEROBIC AND ANAEROBIC  Final   Culture NO GROWTH 5 DAYS  Final   Report Status 09/28/2015 FINAL  Final  Culture, respiratory (NON-Expectorated)     Status: None   Collection Time: 09/23/15 12:01 AM  Result Value Ref Range Status   Specimen Description TRACHEAL ASPIRATE  Final   Special Requests NONE  Final   Gram Stain   Final    GOOD SPECIMEN - 80-90% WBCS MODERATE WBC SEEN RARE YEAST RARE GRAM POSITIVE COCCI    Culture Consistent with normal respiratory flora.  Final   Report Status 09/25/2015 FINAL  Final  Urine culture     Status: None   Collection Time: 10/01/15 10:35 AM  Result Value Ref Range Status   Specimen Description URINE, RANDOM  Final   Special Requests NONE  Final   Culture NO GROWTH 2 DAYS  Final   Report Status 10/03/2015 FINAL  Final  CULTURE, BLOOD (ROUTINE X 2) w Reflex to PCR ID Panel     Status: None   Collection Time: 10/01/15 10:56 AM  Result Value Ref Range Status   Specimen Description BLOOD RIGHT ARM  Final   Special Requests   Final    BOTTLES DRAWN AEROBIC AND ANAEROBIC  AER 3CC ANA 5CC   Culture NO GROWTH 5 DAYS  Final   Report Status 10/06/2015 FINAL  Final  CULTURE, BLOOD (ROUTINE X 2) w Reflex to PCR ID Panel     Status: None   Collection Time: 10/01/15 11:14 AM  Result Value Ref Range Status   Specimen Description BLOOD LEFT HAND  Final   Special Requests   Final    BOTTLES DRAWN AEROBIC  AND ANAEROBIC  AER 3CC ANA 4CC   Culture  Setup Time   Final    GRAM POSITIVE COCCI ANAEROBIC BOTTLE ONLY CRITICAL RESULT CALLED TO, READ BACK BY AND VERIFIED WITH: CRYSTAL SCARPENA AT 0915 ON 10/02/15 CTJ    Culture   Final    COAGULASE NEGATIVE STAPHYLOCOCCUS ANAEROBIC BOTTLE ONLY Results consistent with contamination.    Report Status 10/06/2015 FINAL  Final  Blood Culture ID Panel (Reflexed)     Status: Abnormal   Collection Time: 10/01/15 11:14 AM  Result Value Ref Range Status   Enterococcus species NOT DETECTED NOT DETECTED Final   Listeria monocytogenes NOT DETECTED NOT DETECTED Final  Staphylococcus species DETECTED (A) NOT DETECTED Final   Staphylococcus aureus NOT DETECTED NOT DETECTED Final   Streptococcus species NOT DETECTED NOT DETECTED Final   Streptococcus agalactiae NOT DETECTED NOT DETECTED Final   Streptococcus pneumoniae NOT DETECTED NOT DETECTED Final   Streptococcus pyogenes NOT DETECTED NOT DETECTED Final   Acinetobacter baumannii NOT DETECTED NOT DETECTED Final   Enterobacteriaceae species NOT DETECTED NOT DETECTED Final   Enterobacter cloacae complex NOT DETECTED NOT DETECTED Final   Escherichia coli NOT DETECTED NOT DETECTED Final   Klebsiella oxytoca NOT DETECTED NOT DETECTED Final   Klebsiella pneumoniae NOT DETECTED NOT DETECTED Final   Proteus species NOT DETECTED NOT DETECTED Final   Serratia marcescens NOT DETECTED NOT DETECTED Final   Haemophilus influenzae NOT DETECTED NOT DETECTED Final   Neisseria meningitidis NOT DETECTED NOT DETECTED Final   Pseudomonas aeruginosa NOT DETECTED NOT DETECTED Final   Candida albicans NOT DETECTED NOT DETECTED Final   Candida glabrata NOT DETECTED NOT DETECTED Final   Candida krusei NOT DETECTED NOT DETECTED Final   Candida parapsilosis NOT DETECTED NOT DETECTED Final   Candida tropicalis NOT DETECTED NOT DETECTED Final   Carbapenem resistance NOT DETECTED NOT DETECTED Final   Methicillin resistance DETECTED  (A) NOT DETECTED Final    Comment: CRITICAL RESULT CALLED TO, READ BACK BY AND VERIFIED WITH: CRYSTAL SCARPENA FOR STAPHYLOCOCCUS SPECIES AND METHICILLIN RESISTANCE AT 0915 ON 10/02/15 CTJ    Vancomycin resistance NOT DETECTED NOT DETECTED Final  Culture, expectorated sputum-assessment     Status: None   Collection Time: 10/01/15 11:51 AM  Result Value Ref Range Status   Specimen Description EXPECTORATED SPUTUM  Final   Special Requests Normal  Final   Sputum evaluation THIS SPECIMEN IS ACCEPTABLE FOR SPUTUM CULTURE  Final   Report Status 10/01/2015 FINAL  Final  Culture, respiratory (NON-Expectorated)     Status: None   Collection Time: 10/01/15 11:51 AM  Result Value Ref Range Status   Specimen Description EXPECTORATED SPUTUM  Final   Special Requests Normal Reflexed from [id number]  Final   Gram Stain   Final    GOOD SPECIMEN - 80-90% WBCS MODERATE WBC SEEN FEW YEAST    Culture   Final    LIGHT GROWTH CANDIDA ALBICANS LIGHT GROWTH CANDIDA DUBLINIENSIS    Report Status 10/06/2015 FINAL  Final  Culture, expectorated sputum-assessment     Status: None   Collection Time: 10/08/15  3:30 PM  Result Value Ref Range Status   Specimen Description TRACHEAL ASPIRATE  Final   Special Requests NONE  Final   Sputum evaluation THIS SPECIMEN IS ACCEPTABLE FOR SPUTUM CULTURE  Final   Report Status 10/08/2015 FINAL  Final  Culture, respiratory (NON-Expectorated)     Status: None   Collection Time: 10/08/15  3:30 PM  Result Value Ref Range Status   Specimen Description TRACHEAL ASPIRATE  Final   Special Requests NONE Reflexed from [id number]  Final   Gram Stain   Final    FAIR SPECIMEN - 70-80% WBCS FEW WBC SEEN RARE GRAM POSITIVE COCCI RARE GRAM NEGATIVE RODS    Culture Consistent with normal respiratory flora.  Final   Report Status 10/11/2015 FINAL  Final    Coagulation Studies: No results for input(s): LABPROT, INR in the last 72 hours.  Urinalysis: No results for input(s):  COLORURINE, LABSPEC, PHURINE, GLUCOSEU, HGBUR, BILIRUBINUR, KETONESUR, PROTEINUR, UROBILINOGEN, NITRITE, LEUKOCYTESUR in the last 72 hours.  Invalid input(s): APPERANCEUR    Imaging: Dg Chest [facility] 1 [address]  10/11/2015  CLINICAL DATA:  Respiratory failure. EXAM: PORTABLE CHEST 1 VIEW COMPARISON:  10/09/2015 FINDINGS: Study limited by body habitus and portable nature of the study. Tracheostomy and right central line are unchanged. Cardiomegaly with bilateral airspace opacities, worsening since prior study, likely worsening edema. Probable superimposed bibasilar atelectasis. Cannot exclude small effusions. IMPRESSION: Worsening bilateral airspace disease, likely a combination of worsening edema/ CHF and bibasilar atelectasis. Possible small layering effusions. Electronically Signed   By: Charlett Nose M.D.   On: 10/11/2015 07:26     Medications:   . dexmedetomidine 0.3 mcg/kg/hr (10/12/15 0700)  . norepinephrine (LEVOPHED) Adult infusion Stopped (10/12/15 0540)  . pureflow Stopped (10/11/15 2210)   . amiodarone  200 mg Per Tube BID  . antiseptic oral rinse  7 mL Mouth Rinse 10 times per day  . budesonide  0.25 mg Nebulization 4 times per day  . chlorhexidine gluconate  15 mL Mouth Rinse BID  . docusate  100 mg Oral BID  . famotidine  20 mg Per Tube QODAY  . feeding supplement (PRO-STAT SUGAR FREE 64)  30 mL Per Tube TID  . feeding supplement (VITAL HIGH PROTEIN)  1,000 mL Per Tube Q24H  . free water  30 mL Per Tube 6 times per day  . Influenza vac split quadrivalent PF  0.5 mL Intramuscular Tomorrow-1000  . ipratropium-albuterol  3 mL Nebulization Q6H  . nystatin   Topical BID  . sennosides  5 mL Oral BID   acetaminophen **OR** acetaminophen, albuterol, bisacodyl, calcium carbonate, heparin, metoprolol, midazolam, ondansetron **OR** ondansetron (ZOFRAN) IV, polyethylene glycol, sodium chloride  Assessment/ Plan:  Mr. Mitchell Morrow is a 40 y.o. black male with morbid obesity, tobacco  abuse, without prescribed home medications, who was admitted to [facility] on 09/03/2015, s/p tracheostomy placement, persistent respiratory failure, multiple episodes of ARF  1. Acute Renal Failure:  Second episode of ARF, reconsulted on 10/05/15, hypotension contributing now. Started renal replacement therapy 10/06/15. Baseline creatinine of 1.15 on admission.  Anuric. Now off vasopressors.  - Will do intermittent hemodialysis today. If unable to tolerate, will go back to CRRT.  Does not seem to be a good candidate for outpatient hemodialysis at this time.  - Continue to monitor volume status, urine output, serum electrolytes and renal function.  - Renally dose all medications   2. Acute hypercapnic respiratory failure with bilateral pneumonia and acute exacerbation of systolic congestive heart failure:  With underlying obesity hypoventilation syndrome. Tracheostomy 10/07/2015. Ventilator dependent. ABG has been improving. CXR reviewed.  - Appreciate pulm input.   3.  Anemia with renal failure: hemoglobin down to 9.8. With worsening thrombocytopenia - No acute indication for epo or transfusion. - Check HIT. Avoid heparin products.  - continue to monitor.   4. Hypotension/sepsis: now off vasopressors. Completed course of antibiotics. Concern that hypotension is cardiogenic.  - Continue to monitor.    LOS: 25 Katerina Zurn 1/24/20179:32 AM

## 2015-10-12 NOTE — Progress Notes (Signed)
*  PRELIMINARY RESULTS* Echocardiogram 2D Echocardiogram has been performed.  Garrel Ridgel Stills 10/12/2015, 9:06 PM

## 2015-10-13 DIAGNOSIS — I5031 Acute diastolic (congestive) heart failure: Secondary | ICD-10-CM

## 2015-10-13 LAB — CBC
HCT: 33.5 % — ABNORMAL LOW (ref 40.0–52.0)
Hemoglobin: 9.9 g/dL — ABNORMAL LOW (ref 13.0–18.0)
MCH: 26.1 pg (ref 26.0–34.0)
MCHC: 29.4 g/dL — AB (ref 32.0–36.0)
MCV: 88.8 fL (ref 80.0–100.0)
PLATELETS: 45 10*3/uL — AB (ref 150–440)
RBC: 3.78 MIL/uL — ABNORMAL LOW (ref 4.40–5.90)
RDW: 24.3 % — AB (ref 11.5–14.5)
WBC: 14.2 10*3/uL — AB (ref 3.8–10.6)

## 2015-10-13 LAB — RENAL FUNCTION PANEL
ALBUMIN: 2.6 g/dL — AB (ref 3.5–5.0)
ANION GAP: 14 (ref 5–15)
ANION GAP: 12 (ref 5–15)
Albumin: 2.6 g/dL — ABNORMAL LOW (ref 3.5–5.0)
BUN: 81 mg/dL — ABNORMAL HIGH (ref 6–20)
BUN: 78 mg/dL — ABNORMAL HIGH (ref 6–20)
CALCIUM: 8.9 mg/dL (ref 8.9–10.3)
CHLORIDE: 102 mmol/L (ref 101–111)
CO2: 24 mmol/L (ref 22–32)
CO2: 23 mmol/L (ref 22–32)
Calcium: 8.9 mg/dL (ref 8.9–10.3)
Chloride: 100 mmol/L — ABNORMAL LOW (ref 101–111)
Creatinine, Ser: 2.96 mg/dL — ABNORMAL HIGH (ref 0.61–1.24)
Creatinine, Ser: 2.7 mg/dL — ABNORMAL HIGH (ref 0.61–1.24)
GFR calc non Af Amer: 25 mL/min — ABNORMAL LOW (ref >60)
GFR, EST AFRICAN AMERICAN: 29 mL/min — AB (ref >60)
GFR, EST AFRICAN AMERICAN: 32 mL/min — AB (ref >60)
GFR, EST NON AFRICAN AMERICAN: 28 mL/min — AB (ref >60)
GLUCOSE: 114 mg/dL — AB (ref 65–99)
Glucose, Bld: 126 mg/dL — ABNORMAL HIGH (ref 65–99)
PHOSPHORUS: 3.1 mg/dL (ref 2.5–4.6)
POTASSIUM: 4.6 mmol/L (ref 3.5–5.1)
POTASSIUM: 4.5 mmol/L (ref 3.5–5.1)
Phosphorus: 3.4 mg/dL (ref 2.5–4.6)
SODIUM: 138 mmol/L (ref 135–145)
Sodium: 137 mmol/L (ref 135–145)

## 2015-10-13 LAB — BASIC METABOLIC PANEL
ANION GAP: 6 (ref 5–15)
BUN: 73 mg/dL — ABNORMAL HIGH (ref 6–20)
CALCIUM: 8 mg/dL — AB (ref 8.9–10.3)
CHLORIDE: 103 mmol/L (ref 101–111)
CO2: 25 mmol/L (ref 22–32)
Creatinine, Ser: 2.94 mg/dL — ABNORMAL HIGH (ref 0.61–1.24)
GFR calc Af Amer: 29 mL/min — ABNORMAL LOW (ref >60)
GFR calc non Af Amer: 25 mL/min — ABNORMAL LOW (ref >60)
GLUCOSE: 105 mg/dL — AB (ref 65–99)
POTASSIUM: 4.8 mmol/L (ref 3.5–5.1)
Sodium: 134 mmol/L — ABNORMAL LOW (ref 135–145)

## 2015-10-13 LAB — GLUCOSE, CAPILLARY
GLUCOSE-CAPILLARY: 102 mg/dL — AB (ref 65–99)
GLUCOSE-CAPILLARY: 132 mg/dL — AB (ref 65–99)
Glucose-Capillary: 104 mg/dL — ABNORMAL HIGH (ref 65–99)
Glucose-Capillary: 107 mg/dL — ABNORMAL HIGH (ref 65–99)

## 2015-10-13 LAB — BLOOD GAS, ARTERIAL
Acid-base deficit: 3 mmol/L — ABNORMAL HIGH (ref 0.0–2.0)
Allens test (pass/fail): POSITIVE — AB
Bicarbonate: 26.3 mEq/L (ref 21.0–28.0)
FIO2: 0.85
LHR: 22 {breaths}/min
O2 Saturation: 82.7 %
PEEP/CPAP: 5 cmH2O
PH ART: 7.17 — AB (ref 7.350–7.450)
Patient temperature: 37
VT: 500 mL
pCO2 arterial: 72 mmHg (ref 32.0–48.0)
pO2, Arterial: 60 mmHg — ABNORMAL LOW (ref 83.0–108.0)

## 2015-10-13 LAB — CORTISOL: Cortisol, Plasma: 27.8 ug/dL

## 2015-10-13 LAB — HEPATITIS B SURFACE ANTIGEN: HEP B S AG: NEGATIVE

## 2015-10-13 LAB — HEPATITIS B SURFACE ANTIBODY,QUALITATIVE: Hep B S Ab: NONREACTIVE

## 2015-10-13 LAB — APTT: APTT: 25 s (ref 24–36)

## 2015-10-13 LAB — HEPATITIS B CORE ANTIBODY, TOTAL: Hep B Core Total Ab: NEGATIVE

## 2015-10-13 MED ORDER — HYDROCORTISONE NA SUCCINATE PF 100 MG IJ SOLR
50.0000 mg | Freq: Four times a day (QID) | INTRAMUSCULAR | Status: AC
  Administered 2015-10-13 – 2015-10-16 (×12): 50 mg via INTRAVENOUS
  Filled 2015-10-13 (×12): qty 2

## 2015-10-13 MED ORDER — SENNOSIDES-DOCUSATE SODIUM 8.6-50 MG PO TABS
1.0000 | ORAL_TABLET | Freq: Two times a day (BID) | ORAL | Status: DC
  Administered 2015-10-13 – 2015-10-18 (×11): 1 via ORAL
  Filled 2015-10-13 (×12): qty 1

## 2015-10-13 MED ORDER — HEPARIN SODIUM (PORCINE) 1000 UNIT/ML DIALYSIS: 1000.0000 [IU] | INTRAMUSCULAR | Status: DC | PRN

## 2015-10-13 MED ORDER — DEXTROSE 5 % IV SOLN
2.0000 g | Freq: Two times a day (BID) | INTRAVENOUS | Status: DC
  Administered 2015-10-13 – 2015-10-14 (×2): 2 g via INTRAVENOUS
  Filled 2015-10-13 (×3): qty 2

## 2015-10-13 MED ORDER — VITAL HIGH PROTEIN PO LIQD
1000.0000 mL | ORAL | Status: DC
  Administered 2015-10-13 – 2015-10-18 (×6): 1000 mL

## 2015-10-13 MED ORDER — VANCOMYCIN HCL 10 G IV SOLR
1500.0000 mg | INTRAVENOUS | Status: DC
  Administered 2015-10-13 – 2015-10-14 (×2): 1500 mg via INTRAVENOUS
  Filled 2015-10-13 (×3): qty 1500

## 2015-10-13 NOTE — Progress Notes (Signed)
[facility]  ROUNDING NOTE   Subjective:   Hemodialysis last night. Did not tolerate well. Had to be taken off early with minimal ultrafiltration. Increased norepinephrine requirement.   Norepinephrine 38mcg/min  Platelets 45 (36) (40)  Objective:  Vital signs in last 24 hours:  Temp:  [97.2 F (36.2 C)-100.9 F (38.3 C)] 100.3 F (37.9 C) (01/25 0700) Pulse Rate:  [85-125] 109 (01/25 0900) Resp:  [22-45] 30 (01/25 0900) BP: (74-113)/(46-84) 80/53 mmHg (01/25 0900) SpO2:  [89 %-99 %] 98 % (01/25 0900) FiO2 (%):  [60 %-90 %] 85 % (01/25 0813) Weight:  [291 kg (641 lb 8.6 oz)] 291 kg (641 lb 8.6 oz) (01/25 0500)  Weight change: 1.787 kg (3 lb 15 oz) Filed Weights   10/11/15 0500 10/12/15 0500 10/13/15 0500  Weight: 289.2 kg (637 lb 9.1 oz) 289.213 kg (637 lb 9.6 oz) 291 kg (641 lb 8.6 oz)    Intake/Output: I/O last 3 completed shifts: In: 3425 [I.V.:765; NG/GT:2510; IV Piggyback:150] Out: 440 [Urine:65; Other:375]   Intake/Output this shift:  Total I/O In: 30 [NG/GT:30] Out: -   Physical Exam: General: Critically ill  Head: [place]/AT difficult to assess hearing  Eyes: Mild icterus noted  Neck: Tracheostomy in place  Lungs:  Bilateral rhonchi, vent assisted, Pressure Control Fio2 85%  Heart: S1S2 no rubs  Abdomen:  Soft, nontender, obese  Extremities: 3+ generalized edema.  Neurologic: Awake, will turn head on command  Skin: No lesions  Access Right IJ temp HD catheter 1/18 Dr. Gilda Crease  GU: Foley    Basic Metabolic Panel:  Recent Labs Lab 10/08/15 2332 10/09/15 0319 10/09/15 0809 10/09/15 1737  10/10/15 1610  10/10/15 1811  10/11/15 0429 10/11/15 0626 10/11/15 1336 10/11/15 2006 10/12/15 0501 10/13/15 0457  NA 141 141 142 141  < > 140  < >  --   < > 141  --  139 139 138 134*  K 4.6 4.6 4.6 5.0  < > 4.5  < >  --   < > 4.2  --  4.3 4.2 4.5 4.8  CL 104 104 106 104  < > 104  < >  --   < > 105  --  102 104 104 103  CO2 27 28 27 28   < > 29  <  >  --   < > 29  --  25 27 25 25   GLUCOSE 134* 131* 119* 128*  < > 128*  < >  --   < > 115*  --  115* 135* 116* 105*  BUN 80* 77* 74* 73*  < > 68*  < >  --   < > 60*  --  55* 55* 67* 73*  CREATININE 2.73* 2.65* 2.79* 2.69*  < > 2.51*  < >  --   < > 2.11*  --  2.07* 2.09* 2.36* 2.94*  CALCIUM 9.1 9.0 9.1 9.2  < > 9.0  < >  --   < > 9.2  --  9.2 9.1 9.1 8.0*  MG 2.3 2.2 2.1 2.1  --  2.1  --  2.1  --   --  2.1  --  2.0  --   --   PHOS 3.4 3.3 3.2 4.1  --  3.0  --   --   --   --   --   --   --   --   --   < > = values in this interval not displayed.  Liver Function  Tests:  Recent Labs Lab 10/09/15 0319 10/09/15 0809 10/09/15 1737 10/10/15 0539 10/11/15 0429  AST  --   --   --   --  176*  ALT  --   --   --   --  60  ALKPHOS  --   --   --   --  103  BILITOT  --   --   --   --  3.8*  PROT  --   --   --   --  6.9  ALBUMIN 3.0* 3.2* 3.3* 3.1* 3.0*   No results for input(s): LIPASE, AMYLASE in the last 168 hours. No results for input(s): AMMONIA in the last 168 hours.  CBC:  Recent Labs Lab 10/07/15 1116  10/10/15 0421 10/11/15 0429 10/12/15 0501 10/12/15 1525 10/13/15 0607  WBC 9.0  < > 9.5 10.2 11.9* 14.1* 14.2*  NEUTROABS 6.1  --   --   --   --   --   --   HGB 10.5*  < > 9.8* 9.9* 9.8* 10.1* 9.9*  HCT 35.5*  < > 33.3* 33.6* 33.5* 34.4* 33.5*  MCV 87.6  < > 85.8 86.4 87.0 88.4 88.8  PLT 83*  < > 39* 40* 26* 32* 45*  < > = values in this interval not displayed.  Cardiac Enzymes: No results for input(s): CKTOTAL, CKMB, CKMBINDEX, TROPONINI in the last 168 hours.  BNP: Invalid input(s): POCBNP  CBG:  Recent Labs Lab 10/12/15 0006 10/12/15 0730 10/12/15 1541 10/13/15 0104 10/13/15 0754  GLUCAP 110* 94 105* 104* 102*    Microbiology: Results for orders placed or performed during the hospital encounter of 09/16/2015  Blood culture (routine x 2)     Status: None   Collection Time: 08/21/2015 11:53 PM  Result Value Ref Range Status   Specimen Description BLOOD LEFT  HAND  Final   Special Requests BOTTLES DRAWN AEROBIC AND ANAEROBIC 4CC  Final   Culture NO GROWTH 5 DAYS  Final   Report Status 09/23/2015 FINAL  Final  Blood culture (routine x 2)     Status: None   Collection Time: 08/21/2015 11:53 PM  Result Value Ref Range Status   Specimen Description BLOOD LEFT ARM  Final   Special Requests BOTTLES DRAWN AEROBIC AND ANAEROBIC 5CC  Final   Culture NO GROWTH 5 DAYS  Final   Report Status 09/23/2015 FINAL  Final  Urine culture     Status: None   Collection Time: 09/18/15 12:21 AM  Result Value Ref Range Status   Specimen Description URINE, RANDOM  Final   Special Requests NONE  Final   Culture MULTIPLE SPECIES PRESENT, SUGGEST RECOLLECTION  Final   Report Status 09/19/2015 FINAL  Final  Rapid Influenza A&B Antigens ([facility] only)     Status: None   Collection Time: 09/18/15  6:26 AM  Result Value Ref Range Status   Influenza A ([facility]) NOT DETECTED  Final   Influenza B ([facility]) NOT DETECTED  Final  MRSA PCR Screening     Status: None   Collection Time: 09/20/15  6:30 AM  Result Value Ref Range Status   MRSA by PCR NEGATIVE NEGATIVE Final    Comment:        The GeneXpert MRSA Assay (FDA approved for NASAL specimens only), is one component of a comprehensive MRSA colonization surveillance program. It is not intended to diagnose MRSA infection nor to guide or monitor treatment for MRSA infections.   Culture, respiratory (NON-Expectorated)  Status: None   Collection Time: 09/22/15  3:13 PM  Result Value Ref Range Status   Specimen Description TRACHEAL ASPIRATE  Final   Special Requests NONE  Final   Gram Stain   Final    GOOD SPECIMEN - 80-90% WBCS MODERATE WBC SEEN RARE YEAST RARE GRAM NEGATIVE COCCOBACILLI    Culture LIGHT GROWTH CANDIDA TROPICALIS  Final   Report Status 09/26/2015 FINAL  Final  Urine culture     Status: None   Collection Time: 09/22/15 11:16 PM  Result Value Ref Range Status   Specimen Description URINE, RANDOM  Final    Special Requests NONE  Final   Culture NO GROWTH 2 DAYS  Final   Report Status 09/24/2015 FINAL  Final  CULTURE, BLOOD (ROUTINE X 2) w Reflex to PCR ID Panel     Status: None   Collection Time: 09/22/15 11:44 PM  Result Value Ref Range Status   Specimen Description BLOOD RIGHT ANTECUBITAL  Final   Special Requests BOTTLES DRAWN AEROBIC AND ANAEROBIC  Final   Culture NO GROWTH 5 DAYS  Final   Report Status 09/28/2015 FINAL  Final  CULTURE, BLOOD (ROUTINE X 2) w Reflex to PCR ID Panel     Status: None   Collection Time: 09/22/15 11:56 PM  Result Value Ref Range Status   Specimen Description BLOOD LEFT HAND  Final   Special Requests BOTTLES DRAWN AEROBIC AND ANAEROBIC  Final   Culture NO GROWTH 5 DAYS  Final   Report Status 09/28/2015 FINAL  Final  Culture, respiratory (NON-Expectorated)     Status: None   Collection Time: 09/23/15 12:01 AM  Result Value Ref Range Status   Specimen Description TRACHEAL ASPIRATE  Final   Special Requests NONE  Final   Gram Stain   Final    GOOD SPECIMEN - 80-90% WBCS MODERATE WBC SEEN RARE YEAST RARE GRAM POSITIVE COCCI    Culture Consistent with normal respiratory flora.  Final   Report Status 09/25/2015 FINAL  Final  Urine culture     Status: None   Collection Time: 10/01/15 10:35 AM  Result Value Ref Range Status   Specimen Description URINE, RANDOM  Final   Special Requests NONE  Final   Culture NO GROWTH 2 DAYS  Final   Report Status 10/03/2015 FINAL  Final  CULTURE, BLOOD (ROUTINE X 2) w Reflex to PCR ID Panel     Status: None   Collection Time: 10/01/15 10:56 AM  Result Value Ref Range Status   Specimen Description BLOOD RIGHT ARM  Final   Special Requests   Final    BOTTLES DRAWN AEROBIC AND ANAEROBIC  AER 3CC ANA 5CC   Culture NO GROWTH 5 DAYS  Final   Report Status 10/06/2015 FINAL  Final  CULTURE, BLOOD (ROUTINE X 2) w Reflex to PCR ID Panel     Status: None   Collection Time: 10/01/15 11:14 AM  Result Value Ref Range  Status   Specimen Description BLOOD LEFT HAND  Final   Special Requests   Final    BOTTLES DRAWN AEROBIC AND ANAEROBIC  AER 3CC ANA 4CC   Culture  Setup Time   Final    GRAM POSITIVE COCCI ANAEROBIC BOTTLE ONLY CRITICAL RESULT CALLED TO, READ BACK BY AND VERIFIED WITH: CRYSTAL SCARPENA AT 0915 ON 10/02/15 CTJ    Culture   Final    COAGULASE NEGATIVE STAPHYLOCOCCUS ANAEROBIC BOTTLE ONLY Results consistent with contamination.    Report Status 10/06/2015  FINAL  Final  Blood Culture ID Panel (Reflexed)     Status: Abnormal   Collection Time: 10/01/15 11:14 AM  Result Value Ref Range Status   Enterococcus species NOT DETECTED NOT DETECTED Final   Listeria monocytogenes NOT DETECTED NOT DETECTED Final   Staphylococcus species DETECTED (A) NOT DETECTED Final   Staphylococcus aureus NOT DETECTED NOT DETECTED Final   Streptococcus species NOT DETECTED NOT DETECTED Final   Streptococcus agalactiae NOT DETECTED NOT DETECTED Final   Streptococcus pneumoniae NOT DETECTED NOT DETECTED Final   Streptococcus pyogenes NOT DETECTED NOT DETECTED Final   Acinetobacter baumannii NOT DETECTED NOT DETECTED Final   Enterobacteriaceae species NOT DETECTED NOT DETECTED Final   Enterobacter cloacae complex NOT DETECTED NOT DETECTED Final   Escherichia coli NOT DETECTED NOT DETECTED Final   Klebsiella oxytoca NOT DETECTED NOT DETECTED Final   Klebsiella pneumoniae NOT DETECTED NOT DETECTED Final   Proteus species NOT DETECTED NOT DETECTED Final   Serratia marcescens NOT DETECTED NOT DETECTED Final   Haemophilus influenzae NOT DETECTED NOT DETECTED Final   Neisseria meningitidis NOT DETECTED NOT DETECTED Final   Pseudomonas aeruginosa NOT DETECTED NOT DETECTED Final   Candida albicans NOT DETECTED NOT DETECTED Final   Candida glabrata NOT DETECTED NOT DETECTED Final   Candida krusei NOT DETECTED NOT DETECTED Final   Candida parapsilosis NOT DETECTED NOT DETECTED Final   Candida tropicalis NOT DETECTED NOT  DETECTED Final   Carbapenem resistance NOT DETECTED NOT DETECTED Final   Methicillin resistance DETECTED (A) NOT DETECTED Final    Comment: CRITICAL RESULT CALLED TO, READ BACK BY AND VERIFIED WITH: CRYSTAL SCARPENA FOR STAPHYLOCOCCUS SPECIES AND METHICILLIN RESISTANCE AT 0915 ON 10/02/15 CTJ    Vancomycin resistance NOT DETECTED NOT DETECTED Final  Culture, expectorated sputum-assessment     Status: None   Collection Time: 10/01/15 11:51 AM  Result Value Ref Range Status   Specimen Description EXPECTORATED SPUTUM  Final   Special Requests Normal  Final   Sputum evaluation THIS SPECIMEN IS ACCEPTABLE FOR SPUTUM CULTURE  Final   Report Status 10/01/2015 FINAL  Final  Culture, respiratory (NON-Expectorated)     Status: None   Collection Time: 10/01/15 11:51 AM  Result Value Ref Range Status   Specimen Description EXPECTORATED SPUTUM  Final   Special Requests Normal Reflexed from [id number]  Final   Gram Stain   Final    GOOD SPECIMEN - 80-90% WBCS MODERATE WBC SEEN FEW YEAST    Culture   Final    LIGHT GROWTH CANDIDA ALBICANS LIGHT GROWTH CANDIDA DUBLINIENSIS    Report Status 10/06/2015 FINAL  Final  Culture, expectorated sputum-assessment     Status: None   Collection Time: 10/08/15  3:30 PM  Result Value Ref Range Status   Specimen Description TRACHEAL ASPIRATE  Final   Special Requests NONE  Final   Sputum evaluation THIS SPECIMEN IS ACCEPTABLE FOR SPUTUM CULTURE  Final   Report Status 10/08/2015 FINAL  Final  Culture, respiratory (NON-Expectorated)     Status: None   Collection Time: 10/08/15  3:30 PM  Result Value Ref Range Status   Specimen Description TRACHEAL ASPIRATE  Final   Special Requests NONE Reflexed from [id number]  Final   Gram Stain   Final    FAIR SPECIMEN - 70-80% WBCS FEW WBC SEEN RARE GRAM POSITIVE COCCI RARE GRAM NEGATIVE RODS    Culture Consistent with normal respiratory flora.  Final   Report Status 10/11/2015 FINAL  Final  Culture, bal-quantitative  Status: None (Preliminary result)   Collection Time: 10/12/15 12:16 PM  Result Value Ref Range Status   Specimen Description BRONCHIAL ALVEOLAR LAVAGE  Final   Special Requests Immunocompromised  Final   Gram Stain PENDING  Incomplete   Culture Consistent with normal respiratory flora.  Final   Report Status PENDING  Incomplete    Coagulation Studies: No results for input(s): LABPROT, INR in the last 72 hours.  Urinalysis: No results for input(s): COLORURINE, LABSPEC, PHURINE, GLUCOSEU, HGBUR, BILIRUBINUR, KETONESUR, PROTEINUR, UROBILINOGEN, NITRITE, LEUKOCYTESUR in the last 72 hours.  Invalid input(s): APPERANCEUR    Imaging: No results found.   Medications:   . dexmedetomidine Stopped (10/13/15 0436)  . norepinephrine (LEVOPHED) Adult infusion 15 mcg/min (10/13/15 0931)  . pureflow Stopped (10/11/15 2210)   . ALPRAZolam  0.5 mg Oral TID  . amiodarone  200 mg Per Tube BID  . antiseptic oral rinse  7 mL Mouth Rinse 10 times per day  . budesonide  0.25 mg Nebulization 4 times per day  . ceFEPime (MAXIPIME) IV  2 g Intravenous 3 times per day  . chlorhexidine gluconate  15 mL Mouth Rinse BID  . feeding supplement (PRO-STAT SUGAR FREE 64)  30 mL Per Tube TID  . feeding supplement (VITAL HIGH PROTEIN)  1,000 mL Per Tube Q24H  . free water  30 mL Per Tube 6 times per day  . Influenza vac split quadrivalent PF  0.5 mL Intramuscular Tomorrow-1000  . ipratropium-albuterol  3 mL Nebulization Q6H  . nystatin   Topical BID  . pantoprazole (PROTONIX) IV  40 mg Intravenous Q12H  . tuberculin  5 Units Intradermal Once   acetaminophen **OR** acetaminophen, albuterol, bisacodyl, fentaNYL (SUBLIMAZE) injection, metoprolol, midazolam, ondansetron **OR** ondansetron (ZOFRAN) IV, polyethylene glycol, sodium chloride  Assessment/ Plan:  Mr. Mitchell Morrow is a 40 y.o. black male with morbid obesity, tobacco abuse, without prescribed home medications, who was admitted to [facility] on 08/25/2015,  s/p tracheostomy placement, persistent respiratory failure, multiple episodes of ARF  1. Acute Renal Failure:  Second episode of ARF, reconsulted on 10/05/15, acute renal failure anuric. Baseline creatinine of 1.15 on admission. Started renal replacement therapy 10/06/15, trial of intermittent hemodialysis 1/24.  - restart CRRT today. 4K bath BFR 350 DFR 2K - Continue to monitor volume status, urine output, serum electrolytes and renal function.  - Renally dose all medications   2. Acute hypercapnic respiratory failure with bilateral pneumonia and acute exacerbation of systolic congestive heart failure with respiratory acidosis:  With underlying obesity hypoventilation syndrome. Tracheostomy 09/19/2015. Ventilator dependent. Increasing oxygen requirement - Appreciate pulm input.   3.  Anemia with renal failure: hemoglobin down to 9.9. With thrombocytopenia - No acute indication for epo or transfusion. - Avoid heparin products. HIT pending - continue to monitor.   4. Hypotension/sepsis: on vasopressors: norepinephrine.  Completed course of antibiotics. Concern that hypotension is cardiogenic.  - Continue to monitor.    LOS: 26 Mitchell Morrow 1/25/201710:27 AM

## 2015-10-13 NOTE — Progress Notes (Addendum)
Patient's access pressure starting to decrease below lower range.  Current blood flow rate at 361ml/hr. Blood flow rate increased to 37ml/hr at 2300. Will continue to monitor.

## 2015-10-13 NOTE — Progress Notes (Signed)
CRRT started. 3 mL of heparin and blood aspirated from venous port and 3mL of heparin and blood aspirated from arterial port of dialysis catheter. BP 101/52 (67). Increased levophed to 18 mcg from the current 15 mcg to compensate for CRRT beginning. Will continue to try to titrate down.

## 2015-10-13 NOTE — Progress Notes (Signed)
PULMONARY/CCM PROGRESS NOTE  40 year old massively obese male with acute CHF, volume overload. Now with acute renal failure, vent dependent respiratory failure status post trach.    Lines, Tubes, etc: ETT 01/02 >> . Patient self extubated, subsequently reintubated 09/25/2015 L IJ CVL 01/02 >>   Microbiology: Blood 12/30 >>  MRSA PCR 01/02 >> NEG Urine 01/04 >> NEG Blood 01/04 >> no growth BAL 1/24>>normal respiratory flora Cefepime 1/24>> Vanc 1/24>>  PCT 01/04: 0.74  Antibiotics:  Levofloxacin 12/30 >> 01/01 Ceftriaxone 01/02 >> 01/02 Azithro 01/02 >> 01/02 Levofloxacin 01/04 >> 01/05 Vanc 01/05 >> 1/9 Ceftaz 01/05 >>1/9   PCT 01/04: 0.74, 0.95, 0.77  Studies/Events: 12/31 TTE:  poor quality study, LVEF 45% 12/31 RUQ: Nodular contour the liver raising the question of cirrhosis. Gallbladder wall thickening, nonspecific in appearance 01/04 night - fever, hypotension. Cultures obtained. Abx expanded -failed self extubation 1/24 Bronchoscopy - thick purulent secretions, mainly in the upper airways, mucosal edema and erythema noted, BAL on RML.   Best Practice: DVT: enoxaparin SUP: enteral famotidine Nutrition: TFs    Subj: Patient sedated, had some agitation last night with inc WOB, sedation given.  Currently on PRVC. Fevers noted overnight.  Obj: Filed Vitals:   10/13/15 0700 10/13/15 0736 10/13/15 0800 10/13/15 0900  BP: 91/55  85/53 80/53  Pulse: 112  103 109  Temp: 100.3 F (37.9 C)     TempSrc: Oral     Resp: Height:      Weight:      SpO2: 96% 94% 96% 98%    Gen: morbidly obese HEENT:  WNL.  Mild blood tinged secretions noted.  Neck: CVL site clean, JVP cannot be assessed Chest: clear today somewhat prolonged exp time Cardiac: distant HS, regular, no M noted Abd: obese, soft, + BS Ext: symmetric edema, chronic stasis changes Neuro: GCS<10T  BMET    Component Value Date/Time   NA 134* 10/13/2015 0457   K 4.8 10/13/2015 0457   CL 103 10/13/2015 0457   CO2 25 10/13/2015 0457   GLUCOSE 105* 10/13/2015 0457   BUN 73* 10/13/2015 0457   CREATININE 2.94* 10/13/2015 0457   CALCIUM 8.0* 10/13/2015 0457   GFRNONAA 25* 10/13/2015 0457   GFRAA 29* 10/13/2015 0457    CBC    Component Value Date/Time   WBC 14.2* 10/13/2015 0607   RBC 3.78* 10/13/2015 0607   HGB 9.9* 10/13/2015 0607   HCT 33.5* 10/13/2015 0607   PLT 45* 10/13/2015 0607   MCV 88.8 10/13/2015 0607   MCH 26.1 10/13/2015 0607   MCHC 29.4* 10/13/2015 0607   RDW 24.3* 10/13/2015 0607   LYMPHSABS 0.8* 10/07/2015 1116   MONOABS 1.4* 10/07/2015 1116   EOSABS 0.5 10/07/2015 1116   BASOSABS 0.1 10/07/2015 1116    CXR:  continued atelectasis in the right lung apex. Persistent bibasilar atelectasis and effusions   IMPRESSION: -- Acute on chronic hypercarbic respiratory failure , pulmonary edema.  S/P trach tube placement 10/13/2015 -- Obesity hypoventilation syndrome -- Atelectasis -- Cardiomyopathy - LVEF 45% 09/18/15, repeat ECHO 1/24 EF 50% mod RA Dil and elevated RVSP -- Shock - suspect septic -- AKI, oliguric. CRRT initiated 1/18.  R IJ temporary HD catheter 10/06/15 -- Severe hypervolemia  -- Resolving severe sepsis - monitoring off abx presently -- ICU acquired anemia -- New thrombocytopenia - SQ heparin DC'd 01/22 -- ICU associated delirium -- Intermittent tearfulness, situational depression --Tracheitis  PLAN/RECS:  Cont full vent support - settings reviewed and/or adjusted -  switched to [facility] on 1/24 due to vent dyssynchrony.  Cont vent bundle Daily SBT if/when meets criteria Repeat ECHO performed 1/24, see above,elevated RVSP, dil RV and RA, ? PE, hep stopped due to thrombocytopenia  Wean norepinephrine to off for MAP > 65 mmHg Monitor BMET intermittently Monitor I/Os Correct electrolytes as indicated CRRT per Renal, stopped on 1/24, did not tolerated HD well, restarted CRRT with new cartridges on 1/25. Monitor Plts closely, now  improving.  DVT px: SCDs Monitor CBC intermittently  Keep eye on plt counts Transfuse per usual ICU guidelines PRN fentanyl and Versed, low dose Xanax Cont SSRI Cefepime 1/24, added vanc 1/25 due to having fevers.    CCM time: 45 mins I have reviewed images and labs, incorporated those finding to medical decision making.  The above time includes time spent in consultation with patient and/or family members and reviewing care plan on multidisciplinary rounds  Stephanie Acre, MD [facility] Pager [phone] (please enter 7-digits) On Call Pager - [phone] (please enter 7-digits)

## 2015-10-13 NOTE — Progress Notes (Signed)
Nutrition Follow-up    INTERVENTION:   EN: recommend continuing current TF regimen, continue to assess   NUTRITION DIAGNOSIS:   Inadequate oral intake related to inability to eat as evidenced by NPO status.  GOAL:   Patient will meet greater than or equal to 90% of their needs  MONITOR:    (Energy Intake, Digestive System, Pulmonary Profile, Electrolyte and Renal Profile, Anthropometrics)  REASON FOR ASSESSMENT:   Ventilator, Consult Enteral/tube feeding initiation and management  ASSESSMENT:   Pt on levophed, started on CRRT, febrile, s/p bronch yesterday  EN: tolerating Vital High Protein at rate of 60 ml/hr, Prostat TID  Digestive System: no signs of TF intolerance, +smear this AM, last BM 1/23  Skin:  Reviewed, no issues  Last BM:  1/23   Electrolyte and Renal Profile:  Recent Labs Lab 10/09/15 0809 10/09/15 1737  10/10/15 0539  10/10/15 1811  10/11/15 0626  10/11/15 2006 10/12/15 0501 10/13/15 0457  BUN 74* 73*  < > 68*  < >  --   < >  --   < > 55* 67* 73*  CREATININE 2.79* 2.69*  < > 2.51*  < >  --   < >  --   < > 2.09* 2.36* 2.94*  NA 142 141  < > 140  < >  --   < >  --   < > 139 138 134*  K 4.6 5.0  < > 4.5  < >  --   < >  --   < > 4.2 4.5 4.8  MG 2.1 2.1  --  2.1  --  2.1  --  2.1  --  2.0  --   --   PHOS 3.2 4.1  --  3.0  --   --   --   --   --   --   --   --   < > = values in this interval not displayed. Glucose Profile:  Recent Labs  10/12/15 1541 10/13/15 0104 10/13/15 0754  GLUCAP 105* 104* 102*   Meds: reviewed  Height:   Ht Readings from Last 1 Encounters:  09/20/15  (1.753 m)    Weight:   Wt Readings from Last 1 Encounters:  10/13/15 641 lb 8.6 oz (291 kg)   BMI:  Body mass index is 94.7 kg/(m^2).  Estimated Nutritional Needs:   Kcal:  1600-1817kcals (22-25kcals/kg) using  IBW of 72.7kg  Protein:  145-181g protein (2.0-2.5g/kg) using IBW of 72.7kg  Fluid:  2181-2568mL of fluid (30-69mL/kg)  EDUCATION NEEDS:    No education needs identified at this time  Romelle Starcher MS, RD, LDN [phone] Pager  [phone] Weekend/On-Call Pager

## 2015-10-13 NOTE — Clinical Social Work Note (Signed)
Currently with patient being on CRRT and not hemodialysis, this keeps CSW from being able to look for a vent/snf/dialysis facility for patient at this time. If patient were on hemodialysis and able to tolerate this, then CSW would look into out of state vent/snf/dialysis facilities. CSW would strongly encourage Palliative Care to be consulted to discuss goals of care. York Spaniel MSW,LCSW [phone]

## 2015-10-13 NOTE — Care Management (Signed)
Patient did not tolerate hemodialysis well last pm and is now on Levophed.   Continues to spike temp.  During progression discussed that care team continues to anticipate patient will  successfully wean from vent.  Discussed possibility that patient will require vent snf and that patient will require a peg.  Informed that tertiary facilities have been contacted and all have declined to place Peg.  Will discuss with attending.

## 2015-10-13 NOTE — Progress Notes (Signed)
[facility] - [place] at [facility]   PATIENT NAME: Mitchell Morrow    MR#:  [id number]  DATE OF BIRTH:  [birth year]  SUBJECTIVE:   Patient did not tolerate hemodialysis yesterday and therefore will be replaced on CRRT.   REVIEW OF SYSTEMS:   Review of Systems  Unable to perform ROS: critical illness    DRUG ALLERGIES:  No Known Allergies  VITALS:  Blood pressure 80/53, pulse 109, temperature 100.3 F (37.9 C), temperature source Oral, resp. rate 30, height  (1.753 m), weight 291 kg (641 lb 8.6 oz), SpO2 98 %.  PHYSICAL EXAMINATION:   Physical Exam  Constitutional: He is well-developed, well-nourished, and in no distress. No distress.  Morbidly obese  HENT:  Head: Normocephalic.  Eyes: No scleral icterus.  Neck: Neck supple. No JVD present. No tracheal deviation present.  Tracheostomy placed  Cardiovascular: Normal rate, regular rhythm and normal heart sounds.  Exam reveals no gallop and no friction rub.   No murmur heard. Pulmonary/Chest: Effort normal and breath sounds normal. No respiratory distress. He has no wheezes. He has no rales. He exhibits no tenderness.  Abdominal: Soft. Bowel sounds are normal. He exhibits no distension and no mass. There is no tenderness. There is no rebound and no guarding.  Musculoskeletal: Normal range of motion. He exhibits edema.  Neurological:  Eyes open but patient does not follow commands  Skin: Skin is warm. No rash noted. No erythema.       LABORATORY PANEL:   CBC  Recent Labs Lab 10/13/15 0607  WBC 14.2*  HGB 9.9*  HCT 33.5*  PLT 45*   ------------------------------------------------------------------------------------------------------------------  Chemistries   Recent Labs Lab 10/11/15 0429  10/11/15 2006  10/13/15 0457  NA 141  < > 139  < > 134*  K 4.2  < > 4.2  < > 4.8  CL 105  < > 104  < > 103  CO2 29  < > 27  < > 25  GLUCOSE 115*  < > 135*  < > 105*  BUN 60*  < > 55*  < >  73*  CREATININE 2.11*  < > 2.09*  < > 2.94*  CALCIUM 9.2  < > 9.1  < > 8.0*  MG  --   < > 2.0  --   --   AST 176*  --   --   --   --   ALT 60  --   --   --   --   ALKPHOS 103  --   --   --   --   BILITOT 3.8*  --   --   --   --   < > = values in this interval not displayed. ------------------------------------------------------------------------------------------------------------------  Cardiac Enzymes No results for input(s): TROPONINI in the last 168 hours. ------------------------------------------------------------------------------------------------------------------  RADIOLOGY:  No results found.  ASSESSMENT AND PLAN:   40 year old male with a history of morbid obesity and tobacco abuse who presented with lower extremity edema and found to have bilateral pneumonia.  1. Acute hypoxic hypercapnic respiratory failure-: This is multifactorial  due to obesity hypoventilation syndrome, community-acquired pneumonia and pulmonary edema versus ARDS.  Patient remains Intubated. Tracheostomy done on 10/12/2015. Continue vent management as per pulmonary Patient has completed course of antibiotics for treatment of pneumonia with vancomycin and CEFTAZ.   2. Bilateral community-acquired pneumonia with septic shock He was on vancomycin and ceftazidime and he has completed course of treatment.   He was restarted  on pressors due to hypotension after hemodialysis.  3.New onset systolic congestive heart failure,  EF 45% : Continue CRRT  4. Acute renal failure - Due to ATN and diuresis Appreciate nephrology consult Due to persistent oliguria patient was started on  CRRT on 10/06/15. Hemodialysis was attempted however patient did not tolerate this well. He will go back on CRRT.   5. Nutrition-Currently has an NG tube with tube feeds. Will need a PEG tube. Due to his large body habitus,GI cannot do it endoscopically and as per IR team, patient will be too heavy for a CT table and also the  fluoroscopic table due to his weight. So cannot be done by interventional radiology. Surgery services also say they cannot place the PEG tube. Patient will need to have an open procedure for his PEG tube placement by surgical team, however will be a high risk procedure considering his body habitus and also his other multiple medical problems. This will need to be done at bariatric center or teritiary care center- not accepted at [facility] [place] or [facility] at this time as per Dr Nemiah Commander. Continue on NG tube at this time.   6 hyperbilirubinemia: On admission could be passive congestion from congestive heart failure. hepatitis panel and ultrasound of right upper quadrant suggestive of cirrhotic changes.   7. Atrial fibrillation, -known history of paroxysmal atrial fibrillation. Continue on oral amidarone 200 mg by mouth twice a day. Consider decreasing this to 200 mg daily in one week.  Appreciate cardiology consult. Risk of long-term full dose anticoagulation risks outweighs benefit at this time as per cardiac.  8. Anemia of chronic disease and illness: Continue to monitor hemoglobin. No indication for blood transfusion. 9. Depression: Patient needs Paxil.  CODE STATUS: FULL  TOTAL CRITICAL CARE TIME SPENT IN TAKING CARE OF THIS PATIENT: 30 critical care minutes.  He remains critically ill and high risk for cardiopulmonary arrest.     Natacha Jepsen M.D on 10/13/2015   Between 7am to 6pm - Pager - [phone]  After 6pm go to [email or web address] - password [email or web address] [facility]  [place] [facility]  Office  [phone]  CC: Primary care physician; No PCP Per Patient  Note: This dictation was prepared with Dragon dictation along with smaller phrase technology. Any transcriptional errors that result from this process are unintentional.

## 2015-10-13 NOTE — Progress Notes (Signed)
Chaplain rounded in the unit and provided a presence and support with silent prayer. Jefm Petty [phone]

## 2015-10-14 ENCOUNTER — Inpatient Hospital Stay: Payer: Medicaid Other

## 2015-10-14 DIAGNOSIS — I5033 Acute on chronic diastolic (congestive) heart failure: Secondary | ICD-10-CM

## 2015-10-14 LAB — BLOOD CULTURE ID PANEL (REFLEXED)
ACINETOBACTER BAUMANNII: NOT DETECTED
CANDIDA GLABRATA: NOT DETECTED
CANDIDA KRUSEI: NOT DETECTED
CANDIDA TROPICALIS: NOT DETECTED
CARBAPENEM RESISTANCE: NOT DETECTED
Candida albicans: NOT DETECTED
Candida parapsilosis: NOT DETECTED
ENTEROBACTER CLOACAE COMPLEX: NOT DETECTED
ESCHERICHIA COLI: NOT DETECTED
Enterobacteriaceae species: NOT DETECTED
Enterococcus species: NOT DETECTED
Haemophilus influenzae: NOT DETECTED
KLEBSIELLA PNEUMONIAE: NOT DETECTED
Klebsiella oxytoca: NOT DETECTED
Listeria monocytogenes: NOT DETECTED
METHICILLIN RESISTANCE: DETECTED — AB
Neisseria meningitidis: NOT DETECTED
PROTEUS SPECIES: NOT DETECTED
Pseudomonas aeruginosa: NOT DETECTED
SERRATIA MARCESCENS: NOT DETECTED
STREPTOCOCCUS PNEUMONIAE: NOT DETECTED
STREPTOCOCCUS PYOGENES: NOT DETECTED
Staphylococcus aureus (BCID): NOT DETECTED
Staphylococcus species: DETECTED — AB
Streptococcus agalactiae: NOT DETECTED
Streptococcus species: NOT DETECTED
Vancomycin resistance: NOT DETECTED

## 2015-10-14 LAB — RENAL FUNCTION PANEL
ALBUMIN: 2.5 g/dL — AB (ref 3.5–5.0)
ALBUMIN: 2.5 g/dL — AB (ref 3.5–5.0)
ANION GAP: 9 (ref 5–15)
ANION GAP: 10 (ref 5–15)
ANION GAP: 12 (ref 5–15)
Albumin: 2.5 g/dL — ABNORMAL LOW (ref 3.5–5.0)
Albumin: 2.6 g/dL — ABNORMAL LOW (ref 3.5–5.0)
Anion gap: 11 (ref 5–15)
BUN: 79 mg/dL — ABNORMAL HIGH (ref 6–20)
BUN: 81 mg/dL — ABNORMAL HIGH (ref 6–20)
BUN: 75 mg/dL — AB (ref 6–20)
BUN: 75 mg/dL — AB (ref 6–20)
CALCIUM: 9 mg/dL (ref 8.9–10.3)
CALCIUM: 9.1 mg/dL (ref 8.9–10.3)
CALCIUM: 8.7 mg/dL — AB (ref 8.9–10.3)
CHLORIDE: 103 mmol/L (ref 101–111)
CHLORIDE: 101 mmol/L (ref 101–111)
CO2: 26 mmol/L (ref 22–32)
CO2: 25 mmol/L (ref 22–32)
CO2: 26 mmol/L (ref 22–32)
CO2: 24 mmol/L (ref 22–32)
CREATININE: 2.35 mg/dL — AB (ref 0.61–1.24)
Calcium: 8.9 mg/dL (ref 8.9–10.3)
Chloride: 102 mmol/L (ref 101–111)
Chloride: 100 mmol/L — ABNORMAL LOW (ref 101–111)
Creatinine, Ser: 2.66 mg/dL — ABNORMAL HIGH (ref 0.61–1.24)
Creatinine, Ser: 2.58 mg/dL — ABNORMAL HIGH (ref 0.61–1.24)
Creatinine, Ser: 2.16 mg/dL — ABNORMAL HIGH (ref 0.61–1.24)
GFR calc Af Amer: 42 mL/min — ABNORMAL LOW (ref >60)
GFR calc non Af Amer: 36 mL/min — ABNORMAL LOW (ref >60)
GFR, EST AFRICAN AMERICAN: 33 mL/min — AB (ref >60)
GFR, EST AFRICAN AMERICAN: 34 mL/min — AB (ref >60)
GFR, EST AFRICAN AMERICAN: 38 mL/min — AB (ref >60)
GFR, EST NON AFRICAN AMERICAN: 28 mL/min — AB (ref >60)
GFR, EST NON AFRICAN AMERICAN: 29 mL/min — AB (ref >60)
GFR, EST NON AFRICAN AMERICAN: 33 mL/min — AB (ref >60)
GLUCOSE: 130 mg/dL — AB (ref 65–99)
GLUCOSE: 120 mg/dL — AB (ref 65–99)
Glucose, Bld: 130 mg/dL — ABNORMAL HIGH (ref 65–99)
Glucose, Bld: 116 mg/dL — ABNORMAL HIGH (ref 65–99)
PHOSPHORUS: 3.2 mg/dL (ref 2.5–4.6)
POTASSIUM: 4.6 mmol/L (ref 3.5–5.1)
POTASSIUM: 4.5 mmol/L (ref 3.5–5.1)
POTASSIUM: 4.2 mmol/L (ref 3.5–5.1)
Phosphorus: 3.2 mg/dL (ref 2.5–4.6)
Phosphorus: 3.1 mg/dL (ref 2.5–4.6)
Phosphorus: UNDETERMINED mg/dL (ref 2.5–4.6)
Potassium: 4.3 mmol/L (ref 3.5–5.1)
SODIUM: 137 mmol/L (ref 135–145)
SODIUM: 138 mmol/L (ref 135–145)
SODIUM: 136 mmol/L (ref 135–145)
Sodium: 138 mmol/L (ref 135–145)

## 2015-10-14 LAB — CBC
HEMATOCRIT: 31.2 % — AB (ref 40.0–52.0)
Hemoglobin: 9.2 g/dL — ABNORMAL LOW (ref 13.0–18.0)
MCH: 25.1 pg — AB (ref 26.0–34.0)
MCHC: 29.5 g/dL — ABNORMAL LOW (ref 32.0–36.0)
MCV: 85.1 fL (ref 80.0–100.0)
PLATELETS: 59 10*3/uL — AB (ref 150–440)
RBC: 3.67 MIL/uL — AB (ref 4.40–5.90)
RDW: 23.7 % — ABNORMAL HIGH (ref 11.5–14.5)
WBC: 16.8 10*3/uL — AB (ref 3.8–10.6)

## 2015-10-14 LAB — MAGNESIUM: Magnesium: 2 mg/dL (ref 1.7–2.4)

## 2015-10-14 LAB — BLOOD GAS, ARTERIAL
Acid-base deficit: 2.6 mmol/L — ABNORMAL HIGH (ref 0.0–2.0)
Bicarbonate: 25.5 mEq/L (ref 21.0–28.0)
FIO2: 70
MECHANICAL RATE: 30
MECHVT: 500 mL
O2 Saturation: 82.4 %
PEEP: 6 cmH2O
PH ART: 7.23 — AB (ref 7.350–7.450)
Patient temperature: 37
RATE: 30 resp/min
pCO2 arterial: 61 mmHg — ABNORMAL HIGH (ref 32.0–48.0)
pO2, Arterial: 56 mmHg — ABNORMAL LOW (ref 83.0–108.0)

## 2015-10-14 LAB — GLUCOSE, CAPILLARY
GLUCOSE-CAPILLARY: 122 mg/dL — AB (ref 65–99)
GLUCOSE-CAPILLARY: 110 mg/dL — AB (ref 65–99)

## 2015-10-14 LAB — VANCOMYCIN, RANDOM: VANCOMYCIN RM: 15 ug/mL

## 2015-10-14 MED ORDER — SODIUM CHLORIDE 0.9 % IV SOLN
1.0000 g | Freq: Two times a day (BID) | INTRAVENOUS | Status: DC
  Administered 2015-10-14 – 2015-10-18 (×8): 1 g via INTRAVENOUS
  Filled 2015-10-14 (×9): qty 1

## 2015-10-14 MED ORDER — ALTEPLASE 2 MG IJ SOLR
2.0000 mg | Freq: Once | INTRAMUSCULAR | Status: AC
  Administered 2015-10-14: 2 mg
  Filled 2015-10-14: qty 2

## 2015-10-14 MED ORDER — ALTEPLASE 2 MG IJ SOLR: 2.0000 mg | Freq: Once | INTRAMUSCULAR | Status: DC

## 2015-10-14 MED ORDER — VANCOMYCIN HCL 10 G IV SOLR
1500.0000 mg | INTRAVENOUS | Status: DC
  Administered 2015-10-15 – 2015-10-18 (×4): 1500 mg via INTRAVENOUS
  Filled 2015-10-14 (×5): qty 1500

## 2015-10-14 MED ORDER — PANTOPRAZOLE SODIUM 40 MG PO PACK
40.0000 mg | PACK | Freq: Two times a day (BID) | ORAL | Status: DC
  Administered 2015-10-14 – 2015-10-18 (×8): 40 mg
  Filled 2015-10-14 (×9): qty 20

## 2015-10-14 NOTE — Progress Notes (Signed)
0230:  CRRT stopped due to increased pressures; blood returned to patient and lines flushed with saline.  0430:  Attempted to restart CRRT.  Initially, patient lines flushed well an blood return noted.  Once connected to CRRT machine, venous pressures were high.  Upon flushing and pulling back venous line, no blood return noted.  Cartridge discarded and 1.33ml tPa instilled in both lumens at 0530.

## 2015-10-14 NOTE — Progress Notes (Signed)
[facility]  ROUNDING NOTE   Subjective:   Restarted on CRRT. Clotted overnight. Given TPA.  Now restarted but BFR are low at 250 and high venous pressures.   UOP 135 UF 288  Norepinephrine  Blood cultures with MRSA - vanco ordered  Objective:  Vital signs in last 24 hours:  Temp:  [99.4 F (37.4 C)-101.6 F (38.7 C)] 100.2 F (37.9 C) (01/26 0800) Pulse Rate:  [96-110] 100 (01/26 0900) Resp:  [13-33] 28 (01/26 0900) BP: (94-111)/(52-71) 97/56 mmHg (01/26 0900) SpO2:  [88 %-98 %] 96 % (01/26 0900) FiO2 (%):  [70 %-80 %] 70 % (01/26 0750) Weight:  [293.4 kg (646 lb 13.3 oz)] 293.4 kg (646 lb 13.3 oz) (01/26 0500)  Weight change: 2.4 kg (5 lb 4.7 oz) Filed Weights   10/12/15 0500 10/13/15 0500 10/14/15 0500  Weight: 289.213 kg (637 lb 9.6 oz) 291 kg (641 lb 8.6 oz) 293.4 kg (646 lb 13.3 oz)    Intake/Output: I/O last 3 completed shifts: In: 3883.3 [I.V.:503.3; Other:30; NG/GT:2650; IV Piggyback:700] Out: 508 [Urine:185; Emesis/NG output:35; Other:288]   Intake/Output this shift:  Total I/O In: 99.4 [I.V.:9.4; NG/GT:90] Out: -   Physical Exam: General: Critically ill  Head: +OGT  Eyes: Mild icterus noted  Neck: Tracheostomy in place  Lungs:  Bilateral rhonchi, vent assisted, PRVC Fio2 70%  Heart: S1S2 no rubs  Abdomen:  Soft, nontender, obese  Extremities: 3+ generalized edema.  Neurologic: sedated  Skin: No lesions  Access Right IJ temp HD catheter 1/18 Dr. Gilda Crease  GU: Foley    Basic Metabolic Panel:  Recent Labs Lab 10/09/15 1737  10/10/15 0539  10/10/15 1811  10/11/15 1610  10/11/15 2006 10/12/15 0501 10/13/15 0457 10/13/15 1615 10/13/15 2228 10/14/15 0400  NA 141  < > 140  < >  --   < >  --   < > 139 138 134* 138 137 138  K 5.0  < > 4.5  < >  --   < >  --   < > 4.2 4.5 4.8 4.6 4.5 4.6  CL 104  < > 104  < >  --   < >  --   < > 104 104 103 100* 102 103  CO2 28  < > 29  < >  --   < >  --   < > GLUCOSE  128*  < > 128*  < >  --   < >  --   < > 135* 116* 105* 114* 126* 130*  BUN 73*  < > 68*  < >  --   < >  --   < > 55* 67* 73* 81* 78* 79*  CREATININE 2.69*  < > 2.51*  < >  --   < >  --   < > 2.09* 2.36* 2.94* 2.96* 2.70* 2.66*  CALCIUM 9.2  < > 9.0  < >  --   < >  --   < > 9.1 9.1 8.0* 8.9 8.9 8.9  MG 2.1  --  2.1  --  2.1  --  2.1  --  2.0  --   --   --   --  2.0  PHOS 4.1  --  3.0  --   --   --   --   --   --   --   --  3.4 3.1 3.2  < > = values in this  interval not displayed.  Liver Function Tests:  Recent Labs Lab 10/10/15 0539 10/11/15 0429 10/13/15 1615 10/13/15 2228 10/14/15 0400  AST  --  176*  --   --   --   ALT  --  60  --   --   --   ALKPHOS  --  103  --   --   --   BILITOT  --  3.8*  --   --   --   PROT  --  6.9  --   --   --   ALBUMIN 3.1* 3.0* 2.6* 2.6* 2.5*   No results for input(s): LIPASE, AMYLASE in the last 168 hours. No results for input(s): AMMONIA in the last 168 hours.  CBC:  Recent Labs Lab 10/07/15 1116  10/11/15 0429 10/12/15 0501 10/12/15 1525 10/13/15 0607 10/14/15 0400  WBC 9.0  < > 10.2 11.9* 14.1* 14.2* 16.8*  NEUTROABS 6.1  --   --   --   --   --   --   HGB 10.5*  < > 9.9* 9.8* 10.1* 9.9* 9.2*  HCT 35.5*  < > 33.6* 33.5* 34.4* 33.5* 31.2*  MCV 87.6  < > 86.4 87.0 88.4 88.8 85.1  PLT 83*  < > 40* 26* 32* 45* 59*  < > = values in this interval not displayed.  Cardiac Enzymes: No results for input(s): CKTOTAL, CKMB, CKMBINDEX, TROPONINI in the last 168 hours.  BNP: Invalid input(s): POCBNP  CBG:  Recent Labs Lab 10/13/15 0104 10/13/15 0754 10/13/15 1617 10/13/15 2351 10/14/15 0738  GLUCAP 104* 102* 107* 132* 122*    Microbiology: Results for orders placed or performed during the hospital encounter of 08/21/2015  Blood culture (routine x 2)     Status: None   Collection Time: 09/09/2015 11:53 PM  Result Value Ref Range Status   Specimen Description BLOOD LEFT HAND  Final   Special Requests BOTTLES DRAWN AEROBIC AND ANAEROBIC  4CC  Final   Culture NO GROWTH 5 DAYS  Final   Report Status 09/23/2015 FINAL  Final  Blood culture (routine x 2)     Status: None   Collection Time: 09/05/2015 11:53 PM  Result Value Ref Range Status   Specimen Description BLOOD LEFT ARM  Final   Special Requests BOTTLES DRAWN AEROBIC AND ANAEROBIC 5CC  Final   Culture NO GROWTH 5 DAYS  Final   Report Status 09/23/2015 FINAL  Final  Urine culture     Status: None   Collection Time: 09/18/15 12:21 AM  Result Value Ref Range Status   Specimen Description URINE, RANDOM  Final   Special Requests NONE  Final   Culture MULTIPLE SPECIES PRESENT, SUGGEST RECOLLECTION  Final   Report Status 09/19/2015 FINAL  Final  Rapid Influenza A&B Antigens ([facility] only)     Status: None   Collection Time: 09/18/15  6:26 AM  Result Value Ref Range Status   Influenza A ([facility]) NOT DETECTED  Final   Influenza B ([facility]) NOT DETECTED  Final  MRSA PCR Screening     Status: None   Collection Time: 09/20/15  6:30 AM  Result Value Ref Range Status   MRSA by PCR NEGATIVE NEGATIVE Final    Comment:        The GeneXpert MRSA Assay (FDA approved for NASAL specimens only), is one component of a comprehensive MRSA colonization surveillance program. It is not intended to diagnose MRSA infection nor to guide or monitor treatment for MRSA infections.  Culture, respiratory (NON-Expectorated)     Status: None   Collection Time: 09/22/15  3:13 PM  Result Value Ref Range Status   Specimen Description TRACHEAL ASPIRATE  Final   Special Requests NONE  Final   Gram Stain   Final    GOOD SPECIMEN - 80-90% WBCS MODERATE WBC SEEN RARE YEAST RARE GRAM NEGATIVE COCCOBACILLI    Culture LIGHT GROWTH CANDIDA TROPICALIS  Final   Report Status 09/26/2015 FINAL  Final  Urine culture     Status: None   Collection Time: 09/22/15 11:16 PM  Result Value Ref Range Status   Specimen Description URINE, RANDOM  Final   Special Requests NONE  Final   Culture NO GROWTH 2 DAYS  Final    Report Status 09/24/2015 FINAL  Final  CULTURE, BLOOD (ROUTINE X 2) w Reflex to PCR ID Panel     Status: None   Collection Time: 09/22/15 11:44 PM  Result Value Ref Range Status   Specimen Description BLOOD RIGHT ANTECUBITAL  Final   Special Requests BOTTLES DRAWN AEROBIC AND ANAEROBIC  Final   Culture NO GROWTH 5 DAYS  Final   Report Status 09/28/2015 FINAL  Final  CULTURE, BLOOD (ROUTINE X 2) w Reflex to PCR ID Panel     Status: None   Collection Time: 09/22/15 11:56 PM  Result Value Ref Range Status   Specimen Description BLOOD LEFT HAND  Final   Special Requests BOTTLES DRAWN AEROBIC AND ANAEROBIC  Final   Culture NO GROWTH 5 DAYS  Final   Report Status 09/28/2015 FINAL  Final  Culture, respiratory (NON-Expectorated)     Status: None   Collection Time: 09/23/15 12:01 AM  Result Value Ref Range Status   Specimen Description TRACHEAL ASPIRATE  Final   Special Requests NONE  Final   Gram Stain   Final    GOOD SPECIMEN - 80-90% WBCS MODERATE WBC SEEN RARE YEAST RARE GRAM POSITIVE COCCI    Culture Consistent with normal respiratory flora.  Final   Report Status 09/25/2015 FINAL  Final  Urine culture     Status: None   Collection Time: 10/01/15 10:35 AM  Result Value Ref Range Status   Specimen Description URINE, RANDOM  Final   Special Requests NONE  Final   Culture NO GROWTH 2 DAYS  Final   Report Status 10/03/2015 FINAL  Final  CULTURE, BLOOD (ROUTINE X 2) w Reflex to PCR ID Panel     Status: None   Collection Time: 10/01/15 10:56 AM  Result Value Ref Range Status   Specimen Description BLOOD RIGHT ARM  Final   Special Requests   Final    BOTTLES DRAWN AEROBIC AND ANAEROBIC  AER 3CC ANA 5CC   Culture NO GROWTH 5 DAYS  Final   Report Status 10/06/2015 FINAL  Final  CULTURE, BLOOD (ROUTINE X 2) w Reflex to PCR ID Panel     Status: None   Collection Time: 10/01/15 11:14 AM  Result Value Ref Range Status   Specimen Description BLOOD LEFT HAND  Final   Special  Requests   Final    BOTTLES DRAWN AEROBIC AND ANAEROBIC  AER 3CC ANA 4CC   Culture  Setup Time   Final    GRAM POSITIVE COCCI ANAEROBIC BOTTLE ONLY CRITICAL RESULT CALLED TO, READ BACK BY AND VERIFIED WITH: CRYSTAL SCARPENA AT 0915 ON 10/02/15 CTJ    Culture   Final    COAGULASE NEGATIVE STAPHYLOCOCCUS ANAEROBIC BOTTLE ONLY Results consistent with  contamination.    Report Status 10/06/2015 FINAL  Final  Blood Culture ID Panel (Reflexed)     Status: Abnormal   Collection Time: 10/01/15 11:14 AM  Result Value Ref Range Status   Enterococcus species NOT DETECTED NOT DETECTED Final   Listeria monocytogenes NOT DETECTED NOT DETECTED Final   Staphylococcus species DETECTED (A) NOT DETECTED Final   Staphylococcus aureus NOT DETECTED NOT DETECTED Final   Streptococcus species NOT DETECTED NOT DETECTED Final   Streptococcus agalactiae NOT DETECTED NOT DETECTED Final   Streptococcus pneumoniae NOT DETECTED NOT DETECTED Final   Streptococcus pyogenes NOT DETECTED NOT DETECTED Final   Acinetobacter baumannii NOT DETECTED NOT DETECTED Final   Enterobacteriaceae species NOT DETECTED NOT DETECTED Final   Enterobacter cloacae complex NOT DETECTED NOT DETECTED Final   Escherichia coli NOT DETECTED NOT DETECTED Final   Klebsiella oxytoca NOT DETECTED NOT DETECTED Final   Klebsiella pneumoniae NOT DETECTED NOT DETECTED Final   Proteus species NOT DETECTED NOT DETECTED Final   Serratia marcescens NOT DETECTED NOT DETECTED Final   Haemophilus influenzae NOT DETECTED NOT DETECTED Final   Neisseria meningitidis NOT DETECTED NOT DETECTED Final   Pseudomonas aeruginosa NOT DETECTED NOT DETECTED Final   Candida albicans NOT DETECTED NOT DETECTED Final   Candida glabrata NOT DETECTED NOT DETECTED Final   Candida krusei NOT DETECTED NOT DETECTED Final   Candida parapsilosis NOT DETECTED NOT DETECTED Final   Candida tropicalis NOT DETECTED NOT DETECTED Final   Carbapenem resistance NOT DETECTED NOT DETECTED  Final   Methicillin resistance DETECTED (A) NOT DETECTED Final    Comment: CRITICAL RESULT CALLED TO, READ BACK BY AND VERIFIED WITH: CRYSTAL SCARPENA FOR STAPHYLOCOCCUS SPECIES AND METHICILLIN RESISTANCE AT 0915 ON 10/02/15 CTJ    Vancomycin resistance NOT DETECTED NOT DETECTED Final  Culture, expectorated sputum-assessment     Status: None   Collection Time: 10/01/15 11:51 AM  Result Value Ref Range Status   Specimen Description EXPECTORATED SPUTUM  Final   Special Requests Normal  Final   Sputum evaluation THIS SPECIMEN IS ACCEPTABLE FOR SPUTUM CULTURE  Final   Report Status 10/01/2015 FINAL  Final  Culture, respiratory (NON-Expectorated)     Status: None   Collection Time: 10/01/15 11:51 AM  Result Value Ref Range Status   Specimen Description EXPECTORATED SPUTUM  Final   Special Requests Normal Reflexed from [id number]  Final   Gram Stain   Final    GOOD SPECIMEN - 80-90% WBCS MODERATE WBC SEEN FEW YEAST    Culture   Final    LIGHT GROWTH CANDIDA ALBICANS LIGHT GROWTH CANDIDA DUBLINIENSIS    Report Status 10/06/2015 FINAL  Final  Culture, expectorated sputum-assessment     Status: None   Collection Time: 10/08/15  3:30 PM  Result Value Ref Range Status   Specimen Description TRACHEAL ASPIRATE  Final   Special Requests NONE  Final   Sputum evaluation THIS SPECIMEN IS ACCEPTABLE FOR SPUTUM CULTURE  Final   Report Status 10/08/2015 FINAL  Final  Culture, respiratory (NON-Expectorated)     Status: None   Collection Time: 10/08/15  3:30 PM  Result Value Ref Range Status   Specimen Description TRACHEAL ASPIRATE  Final   Special Requests NONE Reflexed from [id number]  Final   Gram Stain   Final    FAIR SPECIMEN - 70-80% WBCS FEW WBC SEEN RARE GRAM POSITIVE COCCI RARE GRAM NEGATIVE RODS    Culture Consistent with normal respiratory flora.  Final   Report Status 10/11/2015 FINAL  Final  Culture, bal-quantitative     Status: None (Preliminary result)   Collection Time: 10/12/15  12:16 PM  Result Value Ref Range Status   Specimen Description BRONCHIAL ALVEOLAR LAVAGE  Final   Special Requests Immunocompromised  Final   Gram Stain PENDING  Incomplete   Culture Consistent with normal respiratory flora.  Final   Report Status PENDING  Incomplete  CULTURE, BLOOD (ROUTINE X 2) w Reflex to PCR ID Panel     Status: None (Preliminary result)   Collection Time: 10/12/15  3:59 PM  Result Value Ref Range Status   Specimen Description BLOOD RIGHT ARM  Final   Special Requests   Final    BOTTLES DRAWN AEROBIC AND ANAEROBIC 7CC AERO 9CC ANA   Culture NO GROWTH 2 DAYS  Final   Report Status PENDING  Incomplete  CULTURE, BLOOD (ROUTINE X 2) w Reflex to PCR ID Panel     Status: None (Preliminary result)   Collection Time: 10/12/15  4:07 PM  Result Value Ref Range Status   Specimen Description BLOOD LEFT HAND  Final   Special Requests   Final    BOTTLES DRAWN AEROBIC AND ANAEROBIC 7CC AERO 9CC ANA   Culture  Setup Time   Final    GRAM POSITIVE COCCI AEROBIC BOTTLE ONLY CRITICAL RESULT CALLED TO, READ BACK BY AND VERIFIED WITH: MATT MCBANE AT 9604 10/14/15 DV    Culture   Final    STAPHYLOCOCCUS SPECIES (COAGULASE NEGATIVE) Results consistent with contamination.    Report Status PENDING  Incomplete  Blood Culture ID Panel (Reflexed)     Status: Abnormal   Collection Time: 10/12/15  4:07 PM  Result Value Ref Range Status   Enterococcus species NOT DETECTED NOT DETECTED Final   Listeria monocytogenes NOT DETECTED NOT DETECTED Final   Staphylococcus species DETECTED (A) NOT DETECTED Final    Comment: CRITICAL RESULT CALLED TO, READ BACK BY AND VERIFIED WITH: MATT MCBANE AT 5409 10/14/15 DV    Staphylococcus aureus NOT DETECTED NOT DETECTED Final   Streptococcus species NOT DETECTED NOT DETECTED Final   Streptococcus agalactiae NOT DETECTED NOT DETECTED Final   Streptococcus pneumoniae NOT DETECTED NOT DETECTED Final   Streptococcus pyogenes NOT DETECTED NOT DETECTED Final    Acinetobacter baumannii NOT DETECTED NOT DETECTED Final   Enterobacteriaceae species NOT DETECTED NOT DETECTED Final   Enterobacter cloacae complex NOT DETECTED NOT DETECTED Final   Escherichia coli NOT DETECTED NOT DETECTED Final   Klebsiella oxytoca NOT DETECTED NOT DETECTED Final   Klebsiella pneumoniae NOT DETECTED NOT DETECTED Final   Proteus species NOT DETECTED NOT DETECTED Final   Serratia marcescens NOT DETECTED NOT DETECTED Final   Haemophilus influenzae NOT DETECTED NOT DETECTED Final   Neisseria meningitidis NOT DETECTED NOT DETECTED Final   Pseudomonas aeruginosa NOT DETECTED NOT DETECTED Final   Candida albicans NOT DETECTED NOT DETECTED Final   Candida glabrata NOT DETECTED NOT DETECTED Final   Candida krusei NOT DETECTED NOT DETECTED Final   Candida parapsilosis NOT DETECTED NOT DETECTED Final   Candida tropicalis NOT DETECTED NOT DETECTED Final   Carbapenem resistance NOT DETECTED NOT DETECTED Final   Methicillin resistance DETECTED (A) NOT DETECTED Final    Comment: CRITICAL RESULT CALLED TO, READ BACK BY AND VERIFIED WITH: MATT MCBANE AT 8119 10/14/15 DV    Vancomycin resistance NOT DETECTED NOT DETECTED Final    Coagulation Studies: No results for input(s): LABPROT, INR in the last 72 hours.  Urinalysis: No results for  input(s): COLORURINE, LABSPEC, PHURINE, GLUCOSEU, HGBUR, BILIRUBINUR, KETONESUR, PROTEINUR, UROBILINOGEN, NITRITE, LEUKOCYTESUR in the last 72 hours.  Invalid input(s): APPERANCEUR    Imaging: Dg Chest Port 1 View  10/14/2015  CLINICAL DATA:  Respiratory failure. EXAM: PORTABLE CHEST 1 VIEW COMPARISON:  October 11, 2015 FINDINGS: There is a There is a new line entering via a left subclavian approach terminating in the SVC. A the right central line is stable. No pneumothorax. Increased interstitial markings in the lungs consistent with edema. The left retrocardiac region is not well assessed but there is obscuration of the left hemidiaphragm,  stable. No change in the cardiomediastinal silhouette. IMPRESSION: There is a new left-sided line in good position. No pneumothorax. Continued of pulmonary edema. Electronically Signed   By: Gerome Sam III M.D   On: 10/14/2015 07:24     Medications:   . dexmedetomidine Stopped (10/13/15 0436)  . feeding supplement (VITAL HIGH PROTEIN) 1,000 mL (10/14/15 0800)  . norepinephrine (LEVOPHED) Adult infusion 10.027 mcg/min (10/14/15 0800)  . pureflow 3 each (10/13/15 2000)   . ALPRAZolam  0.5 mg Oral TID  . amiodarone  200 mg Per Tube BID  . antiseptic oral rinse  7 mL Mouth Rinse 10 times per day  . budesonide  0.25 mg Nebulization 4 times per day  . ceFEPime (MAXIPIME) IV  2 g Intravenous Q12H  . chlorhexidine gluconate  15 mL Mouth Rinse BID  . feeding supplement (PRO-STAT SUGAR FREE 64)  30 mL Per Tube TID  . free water  30 mL Per Tube 6 times per day  . hydrocortisone sod succinate (SOLU-CORTEF) inj  50 mg Intravenous Q6H  . Influenza vac split quadrivalent PF  0.5 mL Intramuscular Tomorrow-1000  . ipratropium-albuterol  3 mL Nebulization Q6H  . nystatin   Topical BID  . pantoprazole (PROTONIX) IV  40 mg Intravenous Q12H  . senna-docusate  1 tablet Oral BID  . tuberculin  5 Units Intradermal Once  . vancomycin  1,500 mg Intravenous Q24H   acetaminophen **OR** acetaminophen, albuterol, bisacodyl, fentaNYL (SUBLIMAZE) injection, metoprolol, midazolam, ondansetron **OR** ondansetron (ZOFRAN) IV, polyethylene glycol, sodium chloride  Assessment/ Plan:  Mr. Mitchell Morrow is a 40 y.o. black male with morbid obesity, tobacco abuse, without prescribed home medications, who was admitted to [facility] on 09/13/2015, s/p tracheostomy placement, persistent respiratory failure, multiple episodes of ARF  1. Acute Renal Failure:  Second episode of ARF, reconsulted on 10/05/15, acute renal failure anuric. Baseline creatinine of 1.15 on admission. Started renal replacement therapy 10/06/15, trial of  intermittent hemodialysis 1/24. Restarted 1/25 CRRT.   - CRRT 4K bath BFR 250 DFR 2K - Continue to monitor volume status, urine output, serum electrolytes and renal function.  - Renally dose all medications   2. Acute hypercapnic respiratory failure with bilateral pneumonia and acute exacerbation of systolic congestive heart failure with respiratory acidosis:  With underlying obesity hypoventilation syndrome. Tracheostomy 09/23/2015. Ventilator dependent.  - Appreciate pulm input.   3.  Anemia with renal failure: hemoglobin down to 9.2. With thrombocytopenia - No acute indication for epo or transfusion. - Avoid heparin products. HIT pending - Will give TPA and monitor closely.   4. Hypotension/sepsis: on vasopressors: norepinephrine. Wbc trending upward. Blood cultures showing MRSA - cefepime and vanco   LOS: 27 Eleonor Ocon 1/26/20179:54 AM

## 2015-10-14 NOTE — Procedures (Signed)
ELECTROENCEPHALOGRAM REPORT   Patient: Mitchell Morrow       Room #: IC10A-AA EEG No. ID: [id number] Age: 40 y.o.        Sex: male Referring Physician: Allena Katz Report Date:  10/14/2015        Interpreting Physician: Thana Farr  History: [name] is an 40 y.o. male with altered mental status  Medications:  Scheduled: . ALPRAZolam  0.5 mg Oral TID  . amiodarone  200 mg Per Tube BID  . antiseptic oral rinse  7 mL Mouth Rinse 10 times per day  . budesonide  0.25 mg Nebulization 4 times per day  . ceFEPime (MAXIPIME) IV  2 g Intravenous Q12H  . chlorhexidine gluconate  15 mL Mouth Rinse BID  . feeding supplement (PRO-STAT SUGAR FREE 64)  30 mL Per Tube TID  . free water  30 mL Per Tube 6 times per day  . hydrocortisone sod succinate (SOLU-CORTEF) inj  50 mg Intravenous Q6H  . Influenza vac split quadrivalent PF  0.5 mL Intramuscular Tomorrow-1000  . ipratropium-albuterol  3 mL Nebulization Q6H  . nystatin   Topical BID  . pantoprazole sodium  40 mg Per Tube BID  . senna-docusate  1 tablet Oral BID  . vancomycin  1,500 mg Intravenous Q24H    Conditions of Recording:  This is a 16 channel EEG carried out with the patient in the intubated and unresponsive state.  Description:  The background activity is very low voltage.  Sensitivity is increased to 3uV/mm to evaluate a background rhythm.  Background activity is is slow and poorly organized consisting of a diffusely distributed polymorphic delta activity.  There e are some occasional faster rhythms noted that resemble sleep spindles and sharp transients that suggest vertex central sharp transients of sleep.   No epileptiform activity is noted.   Hyperventilation and intermittent photic stimulation were not performed.  IMPRESSION: This electroencephalogram consists of slow, low voltage background activity with intermixed transients that suggest sleep.  No epileptiform activity is noted.     Thana Farr,  MD Neurology [phone] 10/14/2015, 2:42 PM

## 2015-10-14 NOTE — Progress Notes (Signed)
PHARMACY - CRITICAL CARE PROGRESS NOTE  Pharmacy Consult for Meropenem, Vancomycin, CRRT medication management, Constipation Prevention, Electrolyte Management      No Known Allergies  Patient Measurements: Height:  (175.3 cm) Weight: (!) 646 lb 13.3 oz (293.4 kg) IBW/kg (Calculated) : 70.7   Vital Signs: Temp: 100 F (37.8 C) (01/26 2100) Temp Source: Rectal (01/26 2000) BP: 93/55 mmHg (01/26 2100) Pulse Rate: 100 (01/26 2100) Intake/Output from previous day: 01/25 0701 - 01/26 0700 In: 2711.4 [I.V.:321.4; NG/GT:1810; IV Piggyback:550] Out: 423 [Urine:135] Intake/Output from this shift: Total I/O In: 178.2 [I.V.:28.2; NG/GT:150] Out: -  Vent settings for last 24 hours: Vent Mode:  [-] PRVC FiO2 (%):  [70 %] 70 % Set Rate:  [30 bmp] 30 bmp Vt Set:  [500 mL] 500 mL PEEP:  [6 cmH20] 6 cmH20  Labs:  Recent Labs  10/12/15 0501 10/12/15 1525 10/13/15 0457 10/13/15 0607  10/14/15 0400 10/14/15 1045 10/14/15 1706  WBC 11.9* 14.1*  --  14.2*  --  16.8*  --   --   HGB 9.8* 10.1*  --  9.9*  --  9.2*  --   --   HCT 33.5* 34.4*  --  33.5*  --  31.2*  --   --   PLT 26* 32*  --  45*  --  59*  --   --   APTT 68*  --  25  --   --   --   --   --   CREATININE 2.36*  --  2.94*  --   < > 2.66* 2.58* 2.35*  MG  --   --   --   --   --  2.0  --   --   PHOS  --   --   --   --   < > 3.2 3.2 3.1  ALBUMIN  --   --   --   --   < > 2.5* 2.5* 2.6*  < > = values in this interval not displayed. Estimated Creatinine Clearance: 93.5 mL/min (by C-G formula based on Cr of 2.35).   Recent Labs  10/13/15 2351 10/14/15 0738 10/14/15 1630  GLUCAP 132* 122* 110*    Microbiology: Recent Results (from the past 720 hour(s))  Blood culture (routine x 2)     Status: None   Collection Time: 09/10/2015 11:53 PM  Result Value Ref Range Status   Specimen Description BLOOD LEFT HAND  Final   Special Requests BOTTLES DRAWN AEROBIC AND ANAEROBIC 4CC  Final   Culture NO GROWTH 5 DAYS  Final    Report Status 09/23/2015 FINAL  Final  Blood culture (routine x 2)     Status: None   Collection Time: 09/16/2015 11:53 PM  Result Value Ref Range Status   Specimen Description BLOOD LEFT ARM  Final   Special Requests BOTTLES DRAWN AEROBIC AND ANAEROBIC 5CC  Final   Culture NO GROWTH 5 DAYS  Final   Report Status 09/23/2015 FINAL  Final  Urine culture     Status: None   Collection Time: 09/18/15 12:21 AM  Result Value Ref Range Status   Specimen Description URINE, RANDOM  Final   Special Requests NONE  Final   Culture MULTIPLE SPECIES PRESENT, SUGGEST RECOLLECTION  Final   Report Status 09/19/2015 FINAL  Final  Rapid Influenza A&B Antigens ([facility] only)     Status: None   Collection Time: 09/18/15  6:26 AM  Result Value Ref Range Status   Influenza A (  [facility]) NOT DETECTED  Final   Influenza B ([facility]) NOT DETECTED  Final  MRSA PCR Screening     Status: None   Collection Time: 09/20/15  6:30 AM  Result Value Ref Range Status   MRSA by PCR NEGATIVE NEGATIVE Final    Comment:        The GeneXpert MRSA Assay (FDA approved for NASAL specimens only), is one component of a comprehensive MRSA colonization surveillance program. It is not intended to diagnose MRSA infection nor to guide or monitor treatment for MRSA infections.   Culture, respiratory (NON-Expectorated)     Status: None   Collection Time: 09/22/15  3:13 PM  Result Value Ref Range Status   Specimen Description TRACHEAL ASPIRATE  Final   Special Requests NONE  Final   Gram Stain   Final    GOOD SPECIMEN - 80-90% WBCS MODERATE WBC SEEN RARE YEAST RARE GRAM NEGATIVE COCCOBACILLI    Culture LIGHT GROWTH CANDIDA TROPICALIS  Final   Report Status 09/26/2015 FINAL  Final  Urine culture     Status: None   Collection Time: 09/22/15 11:16 PM  Result Value Ref Range Status   Specimen Description URINE, RANDOM  Final   Special Requests NONE  Final   Culture NO GROWTH 2 DAYS  Final   Report Status 09/24/2015 FINAL  Final   CULTURE, BLOOD (ROUTINE X 2) w Reflex to PCR ID Panel     Status: None   Collection Time: 09/22/15 11:44 PM  Result Value Ref Range Status   Specimen Description BLOOD RIGHT ANTECUBITAL  Final   Special Requests BOTTLES DRAWN AEROBIC AND ANAEROBIC  Final   Culture NO GROWTH 5 DAYS  Final   Report Status 09/28/2015 FINAL  Final  CULTURE, BLOOD (ROUTINE X 2) w Reflex to PCR ID Panel     Status: None   Collection Time: 09/22/15 11:56 PM  Result Value Ref Range Status   Specimen Description BLOOD LEFT HAND  Final   Special Requests BOTTLES DRAWN AEROBIC AND ANAEROBIC  Final   Culture NO GROWTH 5 DAYS  Final   Report Status 09/28/2015 FINAL  Final  Culture, respiratory (NON-Expectorated)     Status: None   Collection Time: 09/23/15 12:01 AM  Result Value Ref Range Status   Specimen Description TRACHEAL ASPIRATE  Final   Special Requests NONE  Final   Gram Stain   Final    GOOD SPECIMEN - 80-90% WBCS MODERATE WBC SEEN RARE YEAST RARE GRAM POSITIVE COCCI    Culture Consistent with normal respiratory flora.  Final   Report Status 09/25/2015 FINAL  Final  Urine culture     Status: None   Collection Time: 10/01/15 10:35 AM  Result Value Ref Range Status   Specimen Description URINE, RANDOM  Final   Special Requests NONE  Final   Culture NO GROWTH 2 DAYS  Final   Report Status 10/03/2015 FINAL  Final  CULTURE, BLOOD (ROUTINE X 2) w Reflex to PCR ID Panel     Status: None   Collection Time: 10/01/15 10:56 AM  Result Value Ref Range Status   Specimen Description BLOOD RIGHT ARM  Final   Special Requests   Final    BOTTLES DRAWN AEROBIC AND ANAEROBIC  AER 3CC ANA 5CC   Culture NO GROWTH 5 DAYS  Final   Report Status 10/06/2015 FINAL  Final  CULTURE, BLOOD (ROUTINE X 2) w Reflex to PCR ID Panel     Status: None  Collection Time: 10/01/15 11:14 AM  Result Value Ref Range Status   Specimen Description BLOOD LEFT HAND  Final   Special Requests   Final    BOTTLES DRAWN AEROBIC  AND ANAEROBIC  AER 3CC ANA 4CC   Culture  Setup Time   Final    GRAM POSITIVE COCCI ANAEROBIC BOTTLE ONLY CRITICAL RESULT CALLED TO, READ BACK BY AND VERIFIED WITH: CRYSTAL SCARPENA AT 0915 ON 10/02/15 CTJ    Culture   Final    COAGULASE NEGATIVE STAPHYLOCOCCUS ANAEROBIC BOTTLE ONLY Results consistent with contamination.    Report Status 10/06/2015 FINAL  Final  Blood Culture ID Panel (Reflexed)     Status: Abnormal   Collection Time: 10/01/15 11:14 AM  Result Value Ref Range Status   Enterococcus species NOT DETECTED NOT DETECTED Final   Listeria monocytogenes NOT DETECTED NOT DETECTED Final   Staphylococcus species DETECTED (A) NOT DETECTED Final   Staphylococcus aureus NOT DETECTED NOT DETECTED Final   Streptococcus species NOT DETECTED NOT DETECTED Final   Streptococcus agalactiae NOT DETECTED NOT DETECTED Final   Streptococcus pneumoniae NOT DETECTED NOT DETECTED Final   Streptococcus pyogenes NOT DETECTED NOT DETECTED Final   Acinetobacter baumannii NOT DETECTED NOT DETECTED Final   Enterobacteriaceae species NOT DETECTED NOT DETECTED Final   Enterobacter cloacae complex NOT DETECTED NOT DETECTED Final   Escherichia coli NOT DETECTED NOT DETECTED Final   Klebsiella oxytoca NOT DETECTED NOT DETECTED Final   Klebsiella pneumoniae NOT DETECTED NOT DETECTED Final   Proteus species NOT DETECTED NOT DETECTED Final   Serratia marcescens NOT DETECTED NOT DETECTED Final   Haemophilus influenzae NOT DETECTED NOT DETECTED Final   Neisseria meningitidis NOT DETECTED NOT DETECTED Final   Pseudomonas aeruginosa NOT DETECTED NOT DETECTED Final   Candida albicans NOT DETECTED NOT DETECTED Final   Candida glabrata NOT DETECTED NOT DETECTED Final   Candida krusei NOT DETECTED NOT DETECTED Final   Candida parapsilosis NOT DETECTED NOT DETECTED Final   Candida tropicalis NOT DETECTED NOT DETECTED Final   Carbapenem resistance NOT DETECTED NOT DETECTED Final   Methicillin resistance DETECTED  (A) NOT DETECTED Final    Comment: CRITICAL RESULT CALLED TO, READ BACK BY AND VERIFIED WITH: CRYSTAL SCARPENA FOR STAPHYLOCOCCUS SPECIES AND METHICILLIN RESISTANCE AT 0915 ON 10/02/15 CTJ    Vancomycin resistance NOT DETECTED NOT DETECTED Final  Culture, expectorated sputum-assessment     Status: None   Collection Time: 10/01/15 11:51 AM  Result Value Ref Range Status   Specimen Description EXPECTORATED SPUTUM  Final   Special Requests Normal  Final   Sputum evaluation THIS SPECIMEN IS ACCEPTABLE FOR SPUTUM CULTURE  Final   Report Status 10/01/2015 FINAL  Final  Culture, respiratory (NON-Expectorated)     Status: None   Collection Time: 10/01/15 11:51 AM  Result Value Ref Range Status   Specimen Description EXPECTORATED SPUTUM  Final   Special Requests Normal Reflexed from [id number]  Final   Gram Stain   Final    GOOD SPECIMEN - 80-90% WBCS MODERATE WBC SEEN FEW YEAST    Culture   Final    LIGHT GROWTH CANDIDA ALBICANS LIGHT GROWTH CANDIDA DUBLINIENSIS    Report Status 10/06/2015 FINAL  Final  Culture, expectorated sputum-assessment     Status: None   Collection Time: 10/08/15  3:30 PM  Result Value Ref Range Status   Specimen Description TRACHEAL ASPIRATE  Final   Special Requests NONE  Final   Sputum evaluation THIS SPECIMEN IS ACCEPTABLE FOR SPUTUM CULTURE  Final   Report Status 10/08/2015 FINAL  Final  Culture, respiratory (NON-Expectorated)     Status: None   Collection Time: 10/08/15  3:30 PM  Result Value Ref Range Status   Specimen Description TRACHEAL ASPIRATE  Final   Special Requests NONE Reflexed from [id number]  Final   Gram Stain   Final    FAIR SPECIMEN - 70-80% WBCS FEW WBC SEEN RARE GRAM POSITIVE COCCI RARE GRAM NEGATIVE RODS    Culture Consistent with normal respiratory flora.  Final   Report Status 10/11/2015 FINAL  Final  Culture, bal-quantitative     Status: None (Preliminary result)   Collection Time: 10/12/15 12:16 PM  Result Value Ref Range Status    Specimen Description BRONCHIAL ALVEOLAR LAVAGE  Final   Special Requests Immunocompromised  Final   Gram Stain   Final    FEW WBC SEEN FEW GRAM POSITIVE COCCI FAIR SPECIMEN - 70-80% WBCS    Culture HOLDING FOR POSSIBLE PATHOGEN  Final   Report Status PENDING  Incomplete  CULTURE, BLOOD (ROUTINE X 2) w Reflex to PCR ID Panel     Status: None (Preliminary result)   Collection Time: 10/12/15  3:59 PM  Result Value Ref Range Status   Specimen Description BLOOD RIGHT ARM  Final   Special Requests   Final    BOTTLES DRAWN AEROBIC AND ANAEROBIC 7CC AERO 9CC ANA   Culture NO GROWTH 2 DAYS  Final   Report Status PENDING  Incomplete  CULTURE, BLOOD (ROUTINE X 2) w Reflex to PCR ID Panel     Status: None (Preliminary result)   Collection Time: 10/12/15  4:07 PM  Result Value Ref Range Status   Specimen Description BLOOD LEFT HAND  Final   Special Requests   Final    BOTTLES DRAWN AEROBIC AND ANAEROBIC 7CC AERO 9CC ANA   Culture  Setup Time   Final    GRAM POSITIVE COCCI AEROBIC BOTTLE ONLY CRITICAL RESULT CALLED TO, READ BACK BY AND VERIFIED WITH: MATT MCBANE AT 0454 10/14/15 DV    Culture   Final    STAPHYLOCOCCUS SPECIES (COAGULASE NEGATIVE) Results consistent with contamination.    Report Status PENDING  Incomplete  Blood Culture ID Panel (Reflexed)     Status: Abnormal   Collection Time: 10/12/15  4:07 PM  Result Value Ref Range Status   Enterococcus species NOT DETECTED NOT DETECTED Final   Listeria monocytogenes NOT DETECTED NOT DETECTED Final   Staphylococcus species DETECTED (A) NOT DETECTED Final    Comment: CRITICAL RESULT CALLED TO, READ BACK BY AND VERIFIED WITH: MATT MCBANE AT 0981 10/14/15 DV    Staphylococcus aureus NOT DETECTED NOT DETECTED Final   Streptococcus species NOT DETECTED NOT DETECTED Final   Streptococcus agalactiae NOT DETECTED NOT DETECTED Final   Streptococcus pneumoniae NOT DETECTED NOT DETECTED Final   Streptococcus pyogenes NOT DETECTED NOT DETECTED  Final   Acinetobacter baumannii NOT DETECTED NOT DETECTED Final   Enterobacteriaceae species NOT DETECTED NOT DETECTED Final   Enterobacter cloacae complex NOT DETECTED NOT DETECTED Final   Escherichia coli NOT DETECTED NOT DETECTED Final   Klebsiella oxytoca NOT DETECTED NOT DETECTED Final   Klebsiella pneumoniae NOT DETECTED NOT DETECTED Final   Proteus species NOT DETECTED NOT DETECTED Final   Serratia marcescens NOT DETECTED NOT DETECTED Final   Haemophilus influenzae NOT DETECTED NOT DETECTED Final   Neisseria meningitidis NOT DETECTED NOT DETECTED Final   Pseudomonas aeruginosa NOT DETECTED NOT DETECTED Final   Candida albicans  NOT DETECTED NOT DETECTED Final   Candida glabrata NOT DETECTED NOT DETECTED Final   Candida krusei NOT DETECTED NOT DETECTED Final   Candida parapsilosis NOT DETECTED NOT DETECTED Final   Candida tropicalis NOT DETECTED NOT DETECTED Final   Carbapenem resistance NOT DETECTED NOT DETECTED Final   Methicillin resistance DETECTED (A) NOT DETECTED Final    Comment: CRITICAL RESULT CALLED TO, READ BACK BY AND VERIFIED WITH: MATT MCBANE AT 1610 10/14/15 DV    Vancomycin resistance NOT DETECTED NOT DETECTED Final    Medications:  Scheduled:  . ALPRAZolam  0.5 mg Oral TID  . alteplase  2 mg Intracatheter Once  . alteplase  2 mg Intracatheter Once  . amiodarone  200 mg Per Tube BID  . antiseptic oral rinse  7 mL Mouth Rinse 10 times per day  . budesonide  0.25 mg Nebulization 4 times per day  . chlorhexidine gluconate  15 mL Mouth Rinse BID  . feeding supplement (PRO-STAT SUGAR FREE 64)  30 mL Per Tube TID  . free water  30 mL Per Tube 6 times per day  . hydrocortisone sod succinate (SOLU-CORTEF) inj  50 mg Intravenous Q6H  . Influenza vac split quadrivalent PF  0.5 mL Intramuscular Tomorrow-1000  . ipratropium-albuterol  3 mL Nebulization Q6H  . meropenem (MERREM) IV  1 g Intravenous Q12H  . nystatin   Topical BID  . pantoprazole sodium  40 mg Per Tube  BID  . senna-docusate  1 tablet Oral BID  . [START ON 10/15/2015] vancomycin  1,500 mg Intravenous Q24H   Infusions:  . feeding supplement (VITAL HIGH PROTEIN) 1,000 mL (10/14/15 1124)  . norepinephrine (LEVOPHED) Adult infusion 15 mcg/min (10/14/15 1900)  . pureflow 3 each (10/14/15 1445)    Assessment: Pharmacy consulted for medication management for 40 yo male critical care patient requiring CRRT and mechanical ventilation.   Plan:  1. Patient previously ordered cefepime, stated 1/24. ID transitioned to meropenem on 1/26. Will continue patient on meropenem 1g IV Q12hr.    2. Patient started on vancomycin 1500mg  IV Q24hr while on CRRT. Patient's CRRT access in tenuous. Will continue to check random levels prior to dosing. 1/26 random level was 15. Next random scheduled for 1200 on 1/27.   3. No further adjustments warranted at this time.    4. Continue senna/docusate 1 tab BID. If no bowel movement overnight, will increase to 2 tabs BID.   5. No replacement warranted at this time. Will recheck with am labs.  Pharmacy will continue to monitor and adjust per consult.     Simpson,Michael L 10/14/2015,10:51 PM

## 2015-10-14 NOTE — Progress Notes (Signed)
Brief ID E note Reviewed history, labs. WIll see pt in AM Developed fever and leukocytosis 1/24.  BCX with CNS. Sputum cx pending.   Rec  Cont vanco Change cefepime to meropenem Consider empiric antifungal coverage if worsens.

## 2015-10-14 NOTE — Care Management (Signed)
Neurology consulted.  Brain activity on EEG so does not qualify for brain death but there is concern for brain injury.  Discussed palliative care consult 1/25 during progression and was informed that it is too early  in plan of care for the service.

## 2015-10-14 NOTE — Consult Note (Addendum)
Reason for Consult:Altered mental status Referring Physician: Allena Katz  CC: Altered mental status  HPI: Mitchell Morrow is an 40 y.o. male admitted with bilateral pneumonia and congestive heart failure who eventually required intubation and tracheostomy.  Seemed to be doing well cognitively prior to trach which occurred on 1/16.  After trach was noted to be awake and only following some simple commands per MD notes.  By the 19th the patient was still alert but not following commands. Has had period of hypotension and hypoxia.  Currently on Levophed.  On Precedex until 1/25.  Last Fentanyl given 1/25.  Last versed given the morning of 1/25.  Has not been on Propofol for some time (>10days).  Renal function impaired.  Has had atrial fibrillation.    Past Medical History  Diagnosis Date  . Super obesity (HCC)   . Tobacco abuse   . Chronic systolic CHF (congestive heart failure) (HCC)   . Atrial fibrillation (HCC) 09/2015    Past Surgical History  Procedure Laterality Date  . Tracheostomy tube placement N/A 10/03/2015    Procedure: TRACHEOSTOMY;  Surgeon: Vernie Murders, MD;  Location: [facility] ORS;  Service: ENT;  Laterality: N/A;    Family History  Problem Relation Age of Onset  . Diabetes Other     Social History:  reports that he has been smoking.  He does not have any smokeless tobacco history on file. He reports that he does not drink alcohol. His drug history is not on file.  No Known Allergies  Medications:  I have reviewed the patient's current medications. Prior to Admission:  No prescriptions prior to admission   Scheduled: . ALPRAZolam  0.5 mg Oral TID  . amiodarone  200 mg Per Tube BID  . antiseptic oral rinse  7 mL Mouth Rinse 10 times per day  . budesonide  0.25 mg Nebulization 4 times per day  . ceFEPime (MAXIPIME) IV  2 g Intravenous Q12H  . chlorhexidine gluconate  15 mL Mouth Rinse BID  . feeding supplement (PRO-STAT SUGAR FREE 64)  30 mL Per Tube TID  . free water  30 mL  Per Tube 6 times per day  . hydrocortisone sod succinate (SOLU-CORTEF) inj  50 mg Intravenous Q6H  . Influenza vac split quadrivalent PF  0.5 mL Intramuscular Tomorrow-1000  . ipratropium-albuterol  3 mL Nebulization Q6H  . nystatin   Topical BID  . pantoprazole sodium  40 mg Per Tube BID  . senna-docusate  1 tablet Oral BID  . vancomycin  1,500 mg Intravenous Q24H    ROS: Unable to obtain  Physical Examination: Blood pressure 101/62, pulse 95, temperature 99.4 F (37.4 C), temperature source Oral, resp. rate 26, height 5\' 9"  (1.753 m), weight 293.4 kg (646 lb 13.3 oz), SpO2 96 %.  HEENT-  Normocephalic, no lesions, without obvious abnormality.  Normal external eye and conjunctiva.  Normal TM's bilaterally.  Normal auditory canals and external ears. Normal external nose, mucus membranes and septum.  Normal pharynx. Cardiovascular- S1, S2 normal, pulses palpable throughout   Lungs- chest clear, no wheezing, rales, normal symmetric air entry, Heart exam - S1, S2 normal, no murmur, no gallop, rate regular Abdomen- obese Extremities- edema throughout Lymph-no adenopathy palpable Musculoskeletal-no joint tenderness, deformity Skin-warm and dry, no hyperpigmentation, vitiligo, or suspicious lesions  Neurological Examination Mental Status: Patient does not respond to verbal stimuli.  Does not respond to deep sternal rub.  Does not follow commands.  No verbalizations noted.  Cranial Nerves: II: patient does not  respond confrontation bilaterally, pupils right 5 mm, left 5 mm,and reactive bilaterally III,IV,VI: doll's response present bilaterally. Right gaze preference. V,VII: corneal reflex reduced bilaterally but patient grimaces when procedure performed VIII: patient does not respond to verbal stimuli IX,X: gag reflex reduced, XI: trapezius strength unable to test bilaterally XII: tongue strength unable to test Motor: Extremities flaccid throughout.  Some movement noted to painful  stimuli when the LLE tested Sensory: Movement noted with noxious stimuli provided to the LLE in the LLE only.  No other responses noted to extremity stimulation.   Deep Tendon Reflexes:  2 in the upper extremities, absent in the lower extremities Plantars: mute bilaterally Cerebellar: Unable to perform  Laboratory Studies:   Basic Metabolic Panel:  Recent Labs Lab 10/10/15 0539  10/10/15 1811  10/11/15 0626  10/11/15 2006  10/13/15 0457 10/13/15 1615 10/13/15 2228 10/14/15 0400 10/14/15 1045  NA 140  < >  --   < >  --   < > 139  < > 134* 138 137 138 137  K 4.5  < >  --   < >  --   < > 4.2  < > 4.8 4.6 4.5 4.6 4.5  CL 104  < >  --   < >  --   < > 104  < > 103 100* 102 103 102  CO2 29  < >  --   < >  --   < > 27  < > 25 24 23 26 25   GLUCOSE 128*  < >  --   < >  --   < > 135*  < > 105* 114* 126* 130* 130*  BUN 68*  < >  --   < >  --   < > 55*  < > 73* 81* 78* 79* 81*  CREATININE 2.51*  < >  --   < >  --   < > 2.09*  < > 2.94* 2.96* 2.70* 2.66* 2.58*  CALCIUM 9.0  < >  --   < >  --   < > 9.1  < > 8.0* 8.9 8.9 8.9 9.0  MG 2.1  --  2.1  --  2.1  --  2.0  --   --   --   --  2.0  --   PHOS 3.0  --   --   --   --   --   --   --   --  3.4 3.1 3.2 3.2  < > = values in this interval not displayed.  Liver Function Tests:  Recent Labs Lab 10/11/15 0429 10/13/15 1615 10/13/15 2228 10/14/15 0400 10/14/15 1045  AST 176*  --   --   --   --   ALT 60  --   --   --   --   ALKPHOS 103  --   --   --   --   BILITOT 3.8*  --   --   --   --   PROT 6.9  --   --   --   --   ALBUMIN 3.0* 2.6* 2.6* 2.5* 2.5*   No results for input(s): LIPASE, AMYLASE in the last 168 hours. No results for input(s): AMMONIA in the last 168 hours.  CBC:  Recent Labs Lab 10/11/15 0429 10/12/15 0501 10/12/15 1525 10/13/15 0607 10/14/15 0400  WBC 10.2 11.9* 14.1* 14.2* 16.8*  HGB 9.9* 9.8* 10.1* 9.9* 9.2*  HCT 33.6* 33.5* 34.4* 33.5* 31.2*  MCV 86.4 87.0 88.4 88.8 85.1  PLT 40* 26* 32* 45* 59*     Cardiac Enzymes: No results for input(s): CKTOTAL, CKMB, CKMBINDEX, TROPONINI in the last 168 hours.  BNP: Invalid input(s): POCBNP  CBG:  Recent Labs Lab 10/13/15 0104 10/13/15 0754 10/13/15 1617 10/13/15 2351 10/14/15 0738  GLUCAP 104* 102* 107* 132* 122*    Microbiology: Results for orders placed or performed during the hospital encounter of 08/20/2015  Blood culture (routine x 2)     Status: None   Collection Time: 09/14/2015 11:53 PM  Result Value Ref Range Status   Specimen Description BLOOD LEFT HAND  Final   Special Requests BOTTLES DRAWN AEROBIC AND ANAEROBIC 4CC  Final   Culture NO GROWTH 5 DAYS  Final   Report Status 09/23/2015 FINAL  Final  Blood culture (routine x 2)     Status: None   Collection Time: 09/04/2015 11:53 PM  Result Value Ref Range Status   Specimen Description BLOOD LEFT ARM  Final   Special Requests BOTTLES DRAWN AEROBIC AND ANAEROBIC 5CC  Final   Culture NO GROWTH 5 DAYS  Final   Report Status 09/23/2015 FINAL  Final  Urine culture     Status: None   Collection Time: 09/18/15 12:21 AM  Result Value Ref Range Status   Specimen Description URINE, RANDOM  Final   Special Requests NONE  Final   Culture MULTIPLE SPECIES PRESENT, SUGGEST RECOLLECTION  Final   Report Status 09/19/2015 FINAL  Final  Rapid Influenza A&B Antigens ([facility] only)     Status: None   Collection Time: 09/18/15  6:26 AM  Result Value Ref Range Status   Influenza A ([facility]) NOT DETECTED  Final   Influenza B ([facility]) NOT DETECTED  Final  MRSA PCR Screening     Status: None   Collection Time: 09/20/15  6:30 AM  Result Value Ref Range Status   MRSA by PCR NEGATIVE NEGATIVE Final    Comment:        The GeneXpert MRSA Assay (FDA approved for NASAL specimens only), is one component of a comprehensive MRSA colonization surveillance program. It is not intended to diagnose MRSA infection nor to guide or monitor treatment for MRSA infections.   Culture, respiratory  (NON-Expectorated)     Status: None   Collection Time: 09/22/15  3:13 PM  Result Value Ref Range Status   Specimen Description TRACHEAL ASPIRATE  Final   Special Requests NONE  Final   Gram Stain   Final    GOOD SPECIMEN - 80-90% WBCS MODERATE WBC SEEN RARE YEAST RARE GRAM NEGATIVE COCCOBACILLI    Culture LIGHT GROWTH CANDIDA TROPICALIS  Final   Report Status 09/26/2015 FINAL  Final  Urine culture     Status: None   Collection Time: 09/22/15 11:16 PM  Result Value Ref Range Status   Specimen Description URINE, RANDOM  Final   Special Requests NONE  Final   Culture NO GROWTH 2 DAYS  Final   Report Status 09/24/2015 FINAL  Final  CULTURE, BLOOD (ROUTINE X 2) w Reflex to PCR ID Panel     Status: None   Collection Time: 09/22/15 11:44 PM  Result Value Ref Range Status   Specimen Description BLOOD RIGHT ANTECUBITAL  Final   Special Requests BOTTLES DRAWN AEROBIC AND ANAEROBIC  Final   Culture NO GROWTH 5 DAYS  Final   Report Status 09/28/2015 FINAL  Final  CULTURE, BLOOD (ROUTINE X 2) w Reflex to PCR ID Panel  Status: None   Collection Time: 09/22/15 11:56 PM  Result Value Ref Range Status   Specimen Description BLOOD LEFT HAND  Final   Special Requests BOTTLES DRAWN AEROBIC AND ANAEROBIC  Final   Culture NO GROWTH 5 DAYS  Final   Report Status 09/28/2015 FINAL  Final  Culture, respiratory (NON-Expectorated)     Status: None   Collection Time: 09/23/15 12:01 AM  Result Value Ref Range Status   Specimen Description TRACHEAL ASPIRATE  Final   Special Requests NONE  Final   Gram Stain   Final    GOOD SPECIMEN - 80-90% WBCS MODERATE WBC SEEN RARE YEAST RARE GRAM POSITIVE COCCI    Culture Consistent with normal respiratory flora.  Final   Report Status 09/25/2015 FINAL  Final  Urine culture     Status: None   Collection Time: 10/01/15 10:35 AM  Result Value Ref Range Status   Specimen Description URINE, RANDOM  Final   Special Requests NONE  Final   Culture NO  GROWTH 2 DAYS  Final   Report Status 10/03/2015 FINAL  Final  CULTURE, BLOOD (ROUTINE X 2) w Reflex to PCR ID Panel     Status: None   Collection Time: 10/01/15 10:56 AM  Result Value Ref Range Status   Specimen Description BLOOD RIGHT ARM  Final   Special Requests   Final    BOTTLES DRAWN AEROBIC AND ANAEROBIC  AER 3CC ANA 5CC   Culture NO GROWTH 5 DAYS  Final   Report Status 10/06/2015 FINAL  Final  CULTURE, BLOOD (ROUTINE X 2) w Reflex to PCR ID Panel     Status: None   Collection Time: 10/01/15 11:14 AM  Result Value Ref Range Status   Specimen Description BLOOD LEFT HAND  Final   Special Requests   Final    BOTTLES DRAWN AEROBIC AND ANAEROBIC  AER 3CC ANA 4CC   Culture  Setup Time   Final    GRAM POSITIVE COCCI ANAEROBIC BOTTLE ONLY CRITICAL RESULT CALLED TO, READ BACK BY AND VERIFIED WITH: CRYSTAL SCARPENA AT 0915 ON 10/02/15 CTJ    Culture   Final    COAGULASE NEGATIVE STAPHYLOCOCCUS ANAEROBIC BOTTLE ONLY Results consistent with contamination.    Report Status 10/06/2015 FINAL  Final  Blood Culture ID Panel (Reflexed)     Status: Abnormal   Collection Time: 10/01/15 11:14 AM  Result Value Ref Range Status   Enterococcus species NOT DETECTED NOT DETECTED Final   Listeria monocytogenes NOT DETECTED NOT DETECTED Final   Staphylococcus species DETECTED (A) NOT DETECTED Final   Staphylococcus aureus NOT DETECTED NOT DETECTED Final   Streptococcus species NOT DETECTED NOT DETECTED Final   Streptococcus agalactiae NOT DETECTED NOT DETECTED Final   Streptococcus pneumoniae NOT DETECTED NOT DETECTED Final   Streptococcus pyogenes NOT DETECTED NOT DETECTED Final   Acinetobacter baumannii NOT DETECTED NOT DETECTED Final   Enterobacteriaceae species NOT DETECTED NOT DETECTED Final   Enterobacter cloacae complex NOT DETECTED NOT DETECTED Final   Escherichia coli NOT DETECTED NOT DETECTED Final   Klebsiella oxytoca NOT DETECTED NOT DETECTED Final   Klebsiella pneumoniae NOT DETECTED  NOT DETECTED Final   Proteus species NOT DETECTED NOT DETECTED Final   Serratia marcescens NOT DETECTED NOT DETECTED Final   Haemophilus influenzae NOT DETECTED NOT DETECTED Final   Neisseria meningitidis NOT DETECTED NOT DETECTED Final   Pseudomonas aeruginosa NOT DETECTED NOT DETECTED Final   Candida albicans NOT DETECTED NOT DETECTED Final   Candida  glabrata NOT DETECTED NOT DETECTED Final   Candida krusei NOT DETECTED NOT DETECTED Final   Candida parapsilosis NOT DETECTED NOT DETECTED Final   Candida tropicalis NOT DETECTED NOT DETECTED Final   Carbapenem resistance NOT DETECTED NOT DETECTED Final   Methicillin resistance DETECTED (A) NOT DETECTED Final    Comment: CRITICAL RESULT CALLED TO, READ BACK BY AND VERIFIED WITH: CRYSTAL SCARPENA FOR STAPHYLOCOCCUS SPECIES AND METHICILLIN RESISTANCE AT 0915 ON 10/02/15 CTJ    Vancomycin resistance NOT DETECTED NOT DETECTED Final  Culture, expectorated sputum-assessment     Status: None   Collection Time: 10/01/15 11:51 AM  Result Value Ref Range Status   Specimen Description EXPECTORATED SPUTUM  Final   Special Requests Normal  Final   Sputum evaluation THIS SPECIMEN IS ACCEPTABLE FOR SPUTUM CULTURE  Final   Report Status 10/01/2015 FINAL  Final  Culture, respiratory (NON-Expectorated)     Status: None   Collection Time: 10/01/15 11:51 AM  Result Value Ref Range Status   Specimen Description EXPECTORATED SPUTUM  Final   Special Requests Normal Reflexed from [id number]  Final   Gram Stain   Final    GOOD SPECIMEN - 80-90% WBCS MODERATE WBC SEEN FEW YEAST    Culture   Final    LIGHT GROWTH CANDIDA ALBICANS LIGHT GROWTH CANDIDA DUBLINIENSIS    Report Status 10/06/2015 FINAL  Final  Culture, expectorated sputum-assessment     Status: None   Collection Time: 10/08/15  3:30 PM  Result Value Ref Range Status   Specimen Description TRACHEAL ASPIRATE  Final   Special Requests NONE  Final   Sputum evaluation THIS SPECIMEN IS ACCEPTABLE FOR  SPUTUM CULTURE  Final   Report Status 10/08/2015 FINAL  Final  Culture, respiratory (NON-Expectorated)     Status: None   Collection Time: 10/08/15  3:30 PM  Result Value Ref Range Status   Specimen Description TRACHEAL ASPIRATE  Final   Special Requests NONE Reflexed from [id number]  Final   Gram Stain   Final    FAIR SPECIMEN - 70-80% WBCS FEW WBC SEEN RARE GRAM POSITIVE COCCI RARE GRAM NEGATIVE RODS    Culture Consistent with normal respiratory flora.  Final   Report Status 10/11/2015 FINAL  Final  Culture, bal-quantitative     Status: None (Preliminary result)   Collection Time: 10/12/15 12:16 PM  Result Value Ref Range Status   Specimen Description BRONCHIAL ALVEOLAR LAVAGE  Final   Special Requests Immunocompromised  Final   Gram Stain   Final    FEW WBC SEEN FEW GRAM POSITIVE COCCI FAIR SPECIMEN - 70-80% WBCS    Culture HOLDING FOR POSSIBLE PATHOGEN  Final   Report Status PENDING  Incomplete  CULTURE, BLOOD (ROUTINE X 2) w Reflex to PCR ID Panel     Status: None (Preliminary result)   Collection Time: 10/12/15  3:59 PM  Result Value Ref Range Status   Specimen Description BLOOD RIGHT ARM  Final   Special Requests   Final    BOTTLES DRAWN AEROBIC AND ANAEROBIC 7CC AERO 9CC ANA   Culture NO GROWTH 2 DAYS  Final   Report Status PENDING  Incomplete  CULTURE, BLOOD (ROUTINE X 2) w Reflex to PCR ID Panel     Status: None (Preliminary result)   Collection Time: 10/12/15  4:07 PM  Result Value Ref Range Status   Specimen Description BLOOD LEFT HAND  Final   Special Requests   Final    BOTTLES DRAWN AEROBIC AND ANAEROBIC 7CC AERO  9CC ANA   Culture  Setup Time   Final    GRAM POSITIVE COCCI AEROBIC BOTTLE ONLY CRITICAL RESULT CALLED TO, READ BACK BY AND VERIFIED WITH: MATT MCBANE AT 1610 10/14/15 DV    Culture   Final    STAPHYLOCOCCUS SPECIES (COAGULASE NEGATIVE) Results consistent with contamination.    Report Status PENDING  Incomplete  Blood Culture ID Panel (Reflexed)      Status: Abnormal   Collection Time: 10/12/15  4:07 PM  Result Value Ref Range Status   Enterococcus species NOT DETECTED NOT DETECTED Final   Listeria monocytogenes NOT DETECTED NOT DETECTED Final   Staphylococcus species DETECTED (A) NOT DETECTED Final    Comment: CRITICAL RESULT CALLED TO, READ BACK BY AND VERIFIED WITH: MATT MCBANE AT 9604 10/14/15 DV    Staphylococcus aureus NOT DETECTED NOT DETECTED Final   Streptococcus species NOT DETECTED NOT DETECTED Final   Streptococcus agalactiae NOT DETECTED NOT DETECTED Final   Streptococcus pneumoniae NOT DETECTED NOT DETECTED Final   Streptococcus pyogenes NOT DETECTED NOT DETECTED Final   Acinetobacter baumannii NOT DETECTED NOT DETECTED Final   Enterobacteriaceae species NOT DETECTED NOT DETECTED Final   Enterobacter cloacae complex NOT DETECTED NOT DETECTED Final   Escherichia coli NOT DETECTED NOT DETECTED Final   Klebsiella oxytoca NOT DETECTED NOT DETECTED Final   Klebsiella pneumoniae NOT DETECTED NOT DETECTED Final   Proteus species NOT DETECTED NOT DETECTED Final   Serratia marcescens NOT DETECTED NOT DETECTED Final   Haemophilus influenzae NOT DETECTED NOT DETECTED Final   Neisseria meningitidis NOT DETECTED NOT DETECTED Final   Pseudomonas aeruginosa NOT DETECTED NOT DETECTED Final   Candida albicans NOT DETECTED NOT DETECTED Final   Candida glabrata NOT DETECTED NOT DETECTED Final   Candida krusei NOT DETECTED NOT DETECTED Final   Candida parapsilosis NOT DETECTED NOT DETECTED Final   Candida tropicalis NOT DETECTED NOT DETECTED Final   Carbapenem resistance NOT DETECTED NOT DETECTED Final   Methicillin resistance DETECTED (A) NOT DETECTED Final    Comment: CRITICAL RESULT CALLED TO, READ BACK BY AND VERIFIED WITH: MATT MCBANE AT 5409 10/14/15 DV    Vancomycin resistance NOT DETECTED NOT DETECTED Final    Coagulation Studies: No results for input(s): LABPROT, INR in the last 72 hours.  Urinalysis: No results for  input(s): COLORURINE, LABSPEC, PHURINE, GLUCOSEU, HGBUR, BILIRUBINUR, KETONESUR, PROTEINUR, UROBILINOGEN, NITRITE, LEUKOCYTESUR in the last 168 hours.  Invalid input(s): APPERANCEUR  Lipid Panel:     Component Value Date/Time   TRIG 140 09/23/2015 0448    HgbA1C: No results found for: HGBA1C  Urine Drug Screen:     Component Value Date/Time   LABOPIA NONE DETECTED 09/18/2015 0021   LABBENZ NONE DETECTED 09/18/2015 0021   AMPHETMU NONE DETECTED 09/18/2015 0021   THCU NONE DETECTED 09/18/2015 0021   LABBARB NONE DETECTED 09/18/2015 0021    Alcohol Level: No results for input(s): ETH in the last 168 hours.  Other results: EKG: sinus rhythm at 92 bpm, 1st degree AV block.  Imaging: Dg Chest Port 1 View  10/14/2015  CLINICAL DATA:  Respiratory failure. EXAM: PORTABLE CHEST 1 VIEW COMPARISON:  October 11, 2015 FINDINGS: There is a There is a new line entering via a left subclavian approach terminating in the SVC. A the right central line is stable. No pneumothorax. Increased interstitial markings in the lungs consistent with edema. The left retrocardiac region is not well assessed but there is obscuration of the left hemidiaphragm, stable. No change in the  cardiomediastinal silhouette. IMPRESSION: There is a new left-sided line in good position. No pneumothorax. Continued of pulmonary edema. Electronically Signed   By: Gerome Sam III M.D   On: 10/14/2015 07:24     Assessment/Plan: 40 year old male admitted with CHF and bilateral PNA eventually requiring intubation and tracheostomy.  Has had what appears to be a gradual decline since placement of tracheostomy.  Although patient was on sedation previously and has evidence of end-organ damage that would be expected to slow its clearance, patient was actually doing better while on these medications therefore I doubt that clearance issues are the reason for his current decline.  Although unresponsive, he does have brain activity on EEG,  that appears to resemble sleep, and does not fulfill criteria for brain death.  With his atrial fibrillation, hypotension and episodes of hypoxia, the possibility of brain injury is high.  Patient too obese for cranial imaging.  Echocardiogram was suboptimal but showed no obvious abnormalities.  Unable to be on anticoagulation or antiplatelet therapy due to thrombocytopenia.  Due to multiple comorbidities, prognosis is poor.     Recommendations: 1.  Agree with current management  Thana Farr, MD Neurology [phone] 10/14/2015, 12:36 PM

## 2015-10-14 NOTE — Progress Notes (Signed)
Per Dr. Wynelle Link at bedside, if catheter clots off again, use tPA a total of 3 rounds before calling Dr. Wynelle Link for further orders.

## 2015-10-14 NOTE — Progress Notes (Addendum)
PULMONARY/CCM PROGRESS NOTE  40 year old massively obese male with acute CHF, volume overload. Now with acute renal failure, vent dependent respiratory failure status post trach.    Lines, Tubes, etc: ETT 01/02 >> . Patient self extubated, subsequently reintubated 09/25/2015 L IJ CVL 01/02 >>   Microbiology: Blood 12/30 >>  MRSA PCR 01/02 >> NEG Urine 01/04 >> NEG Blood 01/04 >> no growth BAL 1/24>>normal respiratory flora Cefepime 1/24>> Vanc 1/24>>  PCT 01/04: 0.74  Antibiotics:  Levofloxacin 12/30 >> 01/01 Ceftriaxone 01/02 >> 01/02 Azithro 01/02 >> 01/02 Levofloxacin 01/04 >> 01/05 Vanc 01/05 >> 1/9 Ceftaz 01/05 >>1/9   PCT 01/04: 0.74, 0.95, 0.77  Studies/Events: 12/31 TTE:  poor quality study, LVEF 45% 12/31 RUQ: Nodular contour the liver raising the question of cirrhosis. Gallbladder wall thickening, nonspecific in appearance 01/04 night - fever, hypotension. Cultures obtained. Abx expanded -failed self extubation 1/24 Bronchoscopy - thick purulent secretions, mainly in the upper airways, mucosal edema and erythema noted, BAL on RML.   Best Practice: DVT: enoxaparin SUP: enteral famotidine Nutrition: TFs    Subj: Patient continues to have fevers, only on PRN sedation and agitation meds, still no purposeful movements Obj: Filed Vitals:   10/14/15 1000 10/14/15 1100 10/14/15 1200 10/14/15 1300  BP: 95/61 98/64 101/62 114/76  Pulse: 97 98 95 98  Temp:  99.4 F (37.4 C)    TempSrc:  Oral    Resp: 32 Height:      Weight:      SpO2: 93% 95% 96% 96%    Gen: morbidly obese HEENT:  WNL.  Mild blood tinged secretions noted.  Neck: CVL site clean, JVP cannot be assessed Chest: shallow and clear Cardiac: distant HS, regular, no M noted Abd: obese, soft, + BS Ext: symmetric edema, chronic stasis changes Neuro: GCS<10T  BMET    Component Value Date/Time   NA 137 10/14/2015 1045   K 4.5 10/14/2015 1045   CL 102 10/14/2015 1045   CO2 25  10/14/2015 1045   GLUCOSE 130* 10/14/2015 1045   BUN 81* 10/14/2015 1045   CREATININE 2.58* 10/14/2015 1045   CALCIUM 9.0 10/14/2015 1045   GFRNONAA 29* 10/14/2015 1045   GFRAA 34* 10/14/2015 1045    CBC    Component Value Date/Time   WBC 16.8* 10/14/2015 0400   RBC 3.67* 10/14/2015 0400   HGB 9.2* 10/14/2015 0400   HCT 31.2* 10/14/2015 0400   PLT 59* 10/14/2015 0400   MCV 85.1 10/14/2015 0400   MCH 25.1* 10/14/2015 0400   MCHC 29.5* 10/14/2015 0400   RDW 23.7* 10/14/2015 0400   LYMPHSABS 0.8* 10/07/2015 1116   MONOABS 1.4* 10/07/2015 1116   EOSABS 0.5 10/07/2015 1116   BASOSABS 0.1 10/07/2015 1116    CXR:  continued atelectasis in the right lung apex. Persistent bibasilar atelectasis and effusions   IMPRESSION: -- Acute on chronic hypercarbic respiratory failure , pulmonary edema.  S/P trach tube placement Oct 29, 2015 -- Obesity hypoventilation syndrome -- Atelectasis -- Cardiomyopathy - LVEF 45% 09/18/15, repeat ECHO 1/24 EF 50% mod RA Dil and elevated RVSP -- Shock - suspect septic -- AKI, oliguric. CRRT initiated 1/18.  R IJ temporary HD catheter 10/06/15 -- Severe hypervolemia  -- Sepsis -- ICU acquired anemia -- New thrombocytopenia - SQ heparin DC'd 01/22 -- ICU associated delirium -- Intermittent tearfulness, situational depression --Tracheitis -- neurologic depression - since tracheostomy  PLAN/RECS:  Cont full vent support - settings reviewed and/or adjusted -switched to [facility] on 1/24 due to  vent dyssynchrony.  Cont vent bundle Daily SBT if/when meets criteria Repeat ECHO performed 1/24, see above,elevated RVSP, dil RV and RA, ? PE, hep stopped due to thrombocytopenia  Wean norepinephrine to off for MAP > 65 mmHg Monitor BMET intermittently Monitor I/Os Correct electrolytes as indicated CRRT per Renal, stopped on 1/24, did not tolerated HD well, restarted CRRT with new cartridges on 1/25. Monitor Plts closely, now improving.  DVT px: SCDs Monitor CBC  intermittently  Keep eye on plt counts Transfuse per usual ICU guidelines PRN fentanyl and Versed, low dose Xanax Cont SSRI Cefepime 1/24, added vanc 1/25 due to having fevers. Still with fevers, may need antifungals, will consult ID Seen by neurology, EEG with good Brain activity.   CCM time: 35 mins I have reviewed images and labs, incorporated those finding to medical decision making.  The above time includes time spent in consultation with patient and/or family members and reviewing care plan on multidisciplinary rounds  Stephanie Acre, MD [facility] Pager [phone] (please enter 7-digits) On Call Pager - [phone] (please enter 7-digits)

## 2015-10-14 NOTE — Progress Notes (Signed)
[facility] Physicians - [place] at [facility]   PATIENT NAME: Mitchell Morrow    MR#:  [id number]  DATE OF BIRTH:  09-26-1975  SUBJECTIVE:   Patient did not tolerate hemodialysis yesterday and therefore will be replaced on CRRT. Nursing having issues with clotting off dialysis catheter. Patient remains sedated on the ventilator via tracheostomy. -2 feeding via NG.  REVIEW OF SYSTEMS:   Review of Systems  Unable to perform ROS: critical illness   DRUG ALLERGIES:  No Known Allergies  VITALS:  Blood pressure 97/56, pulse 100, temperature 100.2 F (37.9 C), temperature source Oral, resp. rate 28, height  (1.753 m), weight 293.4 kg (646 lb 13.3 oz), SpO2 96 %.  PHYSICAL EXAMINATION:   Physical Exam  Constitutional: He is well-developed, well-nourished, and in no distress. No distress.  Morbidly obese  HENT:  Head: Normocephalic.  Eyes: No scleral icterus.  Neck: Neck supple. No JVD present. No tracheal deviation present.  Tracheostomy placed  Cardiovascular: Normal rate, regular rhythm and normal heart sounds.  Exam reveals no gallop and no friction rub.   No murmur heard. Pulmonary/Chest: Effort normal and breath sounds normal. No respiratory distress. He has no wheezes. He has no rales. He exhibits no tenderness.  Abdominal: Soft. Bowel sounds are normal. He exhibits no distension and no mass. There is no tenderness. There is no rebound and no guarding.  Musculoskeletal: Normal range of motion. He exhibits edema.  Neurological:  Eyes open but patient does not follow commands  Skin: Skin is warm. No rash noted. No erythema.       LABORATORY PANEL:   CBC  Recent Labs Lab 10/14/15 0400  WBC 16.8*  HGB 9.2*  HCT 31.2*  PLT 59*   ------------------------------------------------------------------------------------------------------------------  Chemistries   Recent Labs Lab 10/11/15 0429  10/14/15 0400  NA 141  < > 138  K 4.2  < > 4.6  CL  105  < > 103  CO2 29  < > 26  GLUCOSE 115*  < > 130*  BUN 60*  < > 79*  CREATININE 2.11*  < > 2.66*  CALCIUM 9.2  < > 8.9  MG  --   < > 2.0  AST 176*  --   --   ALT 60  --   --   ALKPHOS 103  --   --   BILITOT 3.8*  --   --   < > = values in this interval not displayed.  RADIOLOGY:  Dg Chest Port 1 View  10/14/2015  CLINICAL DATA:  Respiratory failure. EXAM: PORTABLE CHEST 1 VIEW COMPARISON:  October 11, 2015 FINDINGS: There is a There is a new line entering via a left subclavian approach terminating in the SVC. A the right central line is stable. No pneumothorax. Increased interstitial markings in the lungs consistent with edema. The left retrocardiac region is not well assessed but there is obscuration of the left hemidiaphragm, stable. No change in the cardiomediastinal silhouette. IMPRESSION: There is a new left-sided line in good position. No pneumothorax. Continued of pulmonary edema. Electronically Signed   By: Gerome Sam III M.D   On: 10/14/2015 07:24    ASSESSMENT AND PLAN:   40 year old male with a history of morbid obesity and tobacco abuse who presented with lower extremity edema and found to have bilateral pneumonia.  1. Acute hypoxic hypercapnic respiratory failure-: This is multifactorial  due to obesity hypoventilation syndrome, community-acquired pneumonia and pulmonary edema versus ARDS.  Patient remains Intubated.  Tracheostomy done on 10/01/2015. Continue vent management as per pulmonary Patient has completed course of antibiotics for treatment of pneumonia with vancomycin and CEFTAZ.  2. Bilateral community-acquired pneumonia with septic shock-sepsis now resolved He was on vancomycin and ceftazidime and he has completed course of treatment.   He was restarted on pressors due to hypotension after hemodialysis.  3.New onset systolic congestive heart failure,  EF 45% : Continue CRRT  4. Acute renal failure - Due to ATN and diuresis Appreciate nephrology  consult Due to persistent oliguria patient was started on  CRRT on 10/06/15. Hemodialysis was attempted however patient did not tolerate this well. He is back on CRRT.   5. Nutrition-Currently has an NG tube with tube feeds. Will need a PEG tube. Due to his large body habitus,GI cannot do it endoscopically and as per IR team, patient will be too heavy for a CT table and also the fluoroscopic table due to his weight. So cannot be done by interventional radiology. Surgery services also say they cannot place the PEG tube. Patient will need to have an open procedure for his PEG tube placement by surgical team, however will be a high risk procedure considering his body habitus and also his other multiple medical problems. This will need to be done at bariatric center or teritiary care center- not accepted at [facility] [place] or [facility] at this [facility] as per Dr Nemiah Commander. Continue on NG tube at this time.  6 hyperbilirubinemia: On admission could be passive congestion from congestive heart failure. hepatitis panel and ultrasound of right upper quadrant suggestive of cirrhotic changes.   7. Atrial fibrillation, -known history of paroxysmal atrial fibrillation. Continue on oral amidarone 200 mg by mouth twice a day. Consider decreasing this to 200 mg daily in one week.  Appreciate cardiology consult. Risk of long-term full dose anticoagulation risks outweighs benefit at this time as per cardiac.  8. Anemia of chronic disease and illness: Continue to monitor hemoglobin. No indication for blood transfusion. 9. Depression: Patient needs Paxil.  CODE STATUS: FULL  TOTAL CRITICAL CARE TIME SPENT IN TAKING CARE OF THIS PATIENT: 30 critical care minutes.  He remains critically ill and high risk for cardiopulmonary arrest.     Krissie Merrick M.D on 10/14/2015   Between 7am to 6pm - Pager - [phone]  After 6pm go to [email or web address] - password [email or web address] [facility]  [place] [facility]  Office   [phone]  CC: Primary care physician; No PCP Per Patient  Note: This dictation was prepared with Dragon dictation along with smaller phrase technology. Any transcriptional errors that result from this process are unintentional.

## 2015-10-15 LAB — RENAL FUNCTION PANEL
ALBUMIN: 2.4 g/dL — AB (ref 3.5–5.0)
ALBUMIN: 2.5 g/dL — AB (ref 3.5–5.0)
ANION GAP: 12 (ref 5–15)
ANION GAP: 14 (ref 5–15)
Albumin: 2.3 g/dL — ABNORMAL LOW (ref 3.5–5.0)
Anion gap: 18 — ABNORMAL HIGH (ref 5–15)
BUN: 70 mg/dL — ABNORMAL HIGH (ref 6–20)
BUN: 70 mg/dL — ABNORMAL HIGH (ref 6–20)
BUN: 70 mg/dL — ABNORMAL HIGH (ref 6–20)
CALCIUM: 8.7 mg/dL — AB (ref 8.9–10.3)
CHLORIDE: 101 mmol/L (ref 101–111)
CHLORIDE: 101 mmol/L (ref 101–111)
CO2: 24 mmol/L (ref 22–32)
CO2: 22 mmol/L (ref 22–32)
CO2: 19 mmol/L — ABNORMAL LOW (ref 22–32)
CREATININE: 1.98 mg/dL — AB (ref 0.61–1.24)
Calcium: 8.6 mg/dL — ABNORMAL LOW (ref 8.9–10.3)
Calcium: 8.8 mg/dL — ABNORMAL LOW (ref 8.9–10.3)
Chloride: 100 mmol/L — ABNORMAL LOW (ref 101–111)
Creatinine, Ser: 2.06 mg/dL — ABNORMAL HIGH (ref 0.61–1.24)
Creatinine, Ser: 1.99 mg/dL — ABNORMAL HIGH (ref 0.61–1.24)
GFR calc Af Amer: 44 mL/min — ABNORMAL LOW (ref >60)
GFR calc non Af Amer: 40 mL/min — ABNORMAL LOW (ref >60)
GFR, EST AFRICAN AMERICAN: 46 mL/min — AB (ref >60)
GFR, EST AFRICAN AMERICAN: 47 mL/min — AB (ref >60)
GFR, EST NON AFRICAN AMERICAN: 38 mL/min — AB (ref >60)
GFR, EST NON AFRICAN AMERICAN: 40 mL/min — AB (ref >60)
GLUCOSE: 120 mg/dL — AB (ref 65–99)
Glucose, Bld: 130 mg/dL — ABNORMAL HIGH (ref 65–99)
Glucose, Bld: 121 mg/dL — ABNORMAL HIGH (ref 65–99)
PHOSPHORUS: UNDETERMINED mg/dL (ref 2.5–4.6)
PHOSPHORUS: UNDETERMINED mg/dL (ref 2.5–4.6)
POTASSIUM: 3.9 mmol/L (ref 3.5–5.1)
Potassium: 4.2 mmol/L (ref 3.5–5.1)
Potassium: 4.3 mmol/L (ref 3.5–5.1)
SODIUM: 136 mmol/L (ref 135–145)
SODIUM: 138 mmol/L (ref 135–145)
Sodium: 137 mmol/L (ref 135–145)

## 2015-10-15 LAB — GLUCOSE, CAPILLARY
GLUCOSE-CAPILLARY: 102 mg/dL — AB (ref 65–99)
GLUCOSE-CAPILLARY: 109 mg/dL — AB (ref 65–99)
GLUCOSE-CAPILLARY: 112 mg/dL — AB (ref 65–99)
GLUCOSE-CAPILLARY: 116 mg/dL — AB (ref 65–99)
Glucose-Capillary: 104 mg/dL — ABNORMAL HIGH (ref 65–99)

## 2015-10-15 LAB — BLOOD GAS, ARTERIAL
ACID-BASE DEFICIT: 3.7 mmol/L — AB (ref 0.0–2.0)
Allens test (pass/fail): POSITIVE — AB
BICARBONATE: 24.3 meq/L (ref 21.0–28.0)
FIO2: 0.7
LHR: 30 {breaths}/min
O2 SAT: 92.7 %
PCO2 ART: 58 mmHg — AB (ref 32.0–48.0)
PEEP/CPAP: 8 cmH2O
PH ART: 7.23 — AB (ref 7.350–7.450)
Patient temperature: 37
VT: 500 mL
pO2, Arterial: 78 mmHg — ABNORMAL LOW (ref 83.0–108.0)

## 2015-10-15 LAB — SEROTONIN RELEASE ASSAY (SRA)
SRA .2 IU/mL UFH Ser-aCnc: 1 % (ref 0–20)
SRA, HIGH DOSE HEPARIN: 1 % (ref 0–20)

## 2015-10-15 LAB — CBC
HCT: 31.3 % — ABNORMAL LOW (ref 40.0–52.0)
HEMOGLOBIN: 9.5 g/dL — AB (ref 13.0–18.0)
MCH: 25.9 pg — ABNORMAL LOW (ref 26.0–34.0)
MCHC: 30.3 g/dL — AB (ref 32.0–36.0)
MCV: 85.5 fL (ref 80.0–100.0)
PLATELETS: 67 10*3/uL — AB (ref 150–440)
RBC: 3.66 MIL/uL — ABNORMAL LOW (ref 4.40–5.90)
RDW: 24.4 % — AB (ref 11.5–14.5)
WBC: 18 10*3/uL — ABNORMAL HIGH (ref 3.8–10.6)

## 2015-10-15 LAB — APTT
aPTT: 44 seconds — ABNORMAL HIGH (ref 24–36)
aPTT: 124 seconds — ABNORMAL HIGH (ref 24–36)

## 2015-10-15 LAB — HEPATIC FUNCTION PANEL
ALT: 1115 U/L — AB (ref 17–63)
AST: 2266 U/L — ABNORMAL HIGH (ref 15–41)
Albumin: 2.3 g/dL — ABNORMAL LOW (ref 3.5–5.0)
Alkaline Phosphatase: 134 U/L — ABNORMAL HIGH (ref 38–126)
BILIRUBIN INDIRECT: 2.5 mg/dL — AB (ref 0.3–0.9)
Bilirubin, Direct: 13.5 mg/dL — ABNORMAL HIGH (ref 0.1–0.5)
TOTAL PROTEIN: 6.5 g/dL (ref 6.5–8.1)
Total Bilirubin: 16 mg/dL — ABNORMAL HIGH (ref 0.3–1.2)

## 2015-10-15 LAB — TROPONIN I: Troponin I: 0.06 ng/mL — ABNORMAL HIGH (ref <0.031)

## 2015-10-15 LAB — PROTIME-INR
INR: 1.98
Prothrombin Time: 22.4 seconds — ABNORMAL HIGH (ref 11.4–15.0)

## 2015-10-15 LAB — VANCOMYCIN, RANDOM: VANCOMYCIN RM: 18 ug/mL

## 2015-10-15 LAB — MAGNESIUM
Magnesium: 1.9 mg/dL (ref 1.7–2.4)
Magnesium: 1.8 mg/dL (ref 1.7–2.4)

## 2015-10-15 MED ORDER — LACTULOSE 10 GM/15ML PO SOLN
10.0000 g | Freq: Every day | ORAL | Status: DC
  Administered 2015-10-15 – 2015-10-16 (×2): 10 g via ORAL
  Filled 2015-10-15 (×2): qty 30

## 2015-10-15 MED ORDER — ARGATROBAN 50 MG/50ML IV SOLN
2.0000 ug/kg/min | INTRAVENOUS | Status: DC
  Filled 2015-10-15: qty 50

## 2015-10-15 MED ORDER — ARGATROBAN 50 MG/50ML IV SOLN
0.1250 ug/kg/min | INTRAVENOUS | Status: DC
  Administered 2015-10-15: 0.5 ug/kg/min via INTRAVENOUS
  Administered 2015-10-15: 0.25 ug/kg/min via INTRAVENOUS
  Filled 2015-10-15 (×2): qty 50

## 2015-10-15 MED ORDER — ASPIRIN 325 MG PO TABS
325.0000 mg | ORAL_TABLET | Freq: Every day | ORAL | Status: DC
  Administered 2015-10-15 – 2015-10-18 (×4): 325 mg via ORAL
  Filled 2015-10-15 (×4): qty 1

## 2015-10-15 NOTE — Progress Notes (Addendum)
[facility] Physicians - [place] at [facility]   PATIENT NAME: Mitchell Morrow    MR#:  [id number]  DATE OF BIRTH:  1975/09/25  SUBJECTIVE:  Tolerating CRRT now  Patient remains sedated on the ventilator via tracheostomy. -Tube feeding via NG. -IV levophed Meropenem and vanc day 2 REVIEW OF SYSTEMS:   Review of Systems  Unable to perform ROS: intubated   DRUG ALLERGIES:  No Known Allergies  VITALS:  Blood pressure 93/60, pulse 96, temperature 99.1 F (37.3 C), temperature source Rectal, resp. rate 25, height  (1.753 m), weight 280.4 kg (618 lb 2.7 oz), SpO2 97 %.  PHYSICAL EXAMINATION:   Physical Exam  Constitutional: He is well-developed, well-nourished, and in no distress. No distress.  Morbidly obese  HENT:  Head: Normocephalic.  Eyes: No scleral icterus.  Neck: Neck supple. No JVD present. No tracheal deviation present.  Tracheostomy placed  Cardiovascular: Normal rate, regular rhythm and normal heart sounds.  Exam reveals no gallop and no friction rub.   No murmur heard. Pulmonary/Chest: Effort normal and breath sounds normal. No respiratory distress. He has no wheezes. He has no rales. He exhibits no tenderness.  Abdominal: Soft. Bowel sounds are normal. He exhibits no distension and no mass. There is no tenderness. There is no rebound and no guarding.  Musculoskeletal: Normal range of motion. He exhibits edema.  Neurological:  Eyes open but patient does not follow commands  Skin: Skin is warm. No rash noted. No erythema.       LABORATORY PANEL:   CBC  Recent Labs Lab 10/15/15 0408  WBC 18.0*  HGB 9.5*  HCT 31.3*  PLT 67*   ------------------------------------------------------------------------------------------------------------------  Chemistries   Recent Labs Lab 10/11/15 0429  10/15/15 0408  NA 141  < > 136  K 4.2  < > 4.2  CL 105  < > 100*  CO2 29  < > 24  GLUCOSE 115*  < > 120*  BUN 60*  < > 70*  CREATININE 2.11*  <  > 2.06*  CALCIUM 9.2  < > 8.7*  MG  --   < > 1.9  AST 176*  --   --   ALT 60  --   --   ALKPHOS 103  --   --   BILITOT 3.8*  --   --   < > = values in this interval not displayed.  RADIOLOGY:  Dg Chest Port 1 View  10/14/2015  CLINICAL DATA:  Respiratory failure. EXAM: PORTABLE CHEST 1 VIEW COMPARISON:  October 11, 2015 FINDINGS: There is a There is a new line entering via a left subclavian approach terminating in the SVC. A the right central line is stable. No pneumothorax. Increased interstitial markings in the lungs consistent with edema. The left retrocardiac region is not well assessed but there is obscuration of the left hemidiaphragm, stable. No change in the cardiomediastinal silhouette. IMPRESSION: There is a new left-sided line in good position. No pneumothorax. Continued of pulmonary edema. Electronically Signed   By: Gerome Sam III M.D   On: 10/14/2015 07:24    ASSESSMENT AND PLAN:   40 year old male with a history of morbid obesity and tobacco abuse who presented with lower extremity edema and found to have bilateral pneumonia.  1. Acute hypoxic hypercapnic respiratory failure-:  -multifactorial  due to obesity hypoventilation syndrome, community-acquired pneumonia and pulmonary edema versus ARDS.  Patient remains Intubated. Tracheostomy done on 09/30/2015. Continue vent management as per pulmonary Patient has completed course  of antibiotics for treatment of pneumonia with vancomycin and CEFTAZ. -spiked fever and now per ID started on Vanc and meropenem day 2 -BC (PCR) from 10/12/15 Positive for MRSA  2. Bilateral community-acquired pneumonia with septic shock-sepsis now resolved He was on vancomycin and ceftazidime and he has completed course of treatment.   He was restarted on pressors due to hypotension after hemodialysis.  3.New onset systolic congestive heart failure,  EF 45% :  -Continue CRRT  4. Acute renal failure - Due to ATN and diuresis Due to persistent  oliguria patient was started on  CRRT on 10/06/15. Hemodialysis was attempted however patient did not tolerate this well. He is back on CRRT.   5. Nutrition-Currently has an NG tube with tube feeds. Will need a PEG tube. Due to his large body habitus,GI cannot do it endoscopically and as per IR team, patient will be too heavy for a CT table and also the fluoroscopic table due to his weight. So cannot be done by interventional radiology. Surgery services also say they cannot place the PEG tube. Patient will need to have an open procedure for his PEG tube placement by surgical team, however will be a high risk procedure considering his body habitus and also his other multiple medical problems. This will need to be done at bariatric center or teritiary care center- not accepted at [facility], [place] or [facility] at this time as  Continue on NG tube at this time.  6 hyperbilirubinemia: On admission could be passive congestion from congestive heart failure. hepatitis panel and ultrasound of right upper quadrant suggestive of cirrhotic changes.  7. Atrial fibrillation, -known history of paroxysmal atrial fibrillation. Continue on oral amidarone 200 mg by mouth twice a day.   Risk of long-term full dose anticoagulation risks outweighs benefit at this time as per cardiac.  8. Anemia of chronic disease and illness: Continue to monitor hemoglobin. No indication for blood transfusion  9. Depression: Patient needs Paxil.  CODE STATUS: FULL  TOTAL CRITICAL CARE TIME SPENT IN TAKING CARE OF THIS PATIENT: 35 critical care minutes.  He remains critically ill and high risk for cardiopulmonary arrest.     Berdella Bacot M.D on 10/15/2015   Between 7am to 6pm - Pager - [phone]  After 6pm go to [email or web address] - password [email or web address] [facility]  [place] [facility]  Office  [phone]  CC: Primary care physician; No PCP Per Patient  Note: This dictation was prepared with Dragon dictation along with smaller phrase  technology. Any transcriptional errors that result from this process are unintentional.

## 2015-10-15 NOTE — Progress Notes (Signed)
Patient's bed scale reading inaccurate weight at this time.  Patient lifted off bed with overhead lift and bed re-zeroed with only flat sheet and control console at the end of the bed.  Patient then weighed with no pillows, no top sheets on, and no equipment (vent tubing or support arm) on bed.  Patient's most accurate weight is now 280.4kg.

## 2015-10-15 NOTE — Progress Notes (Signed)
Called Elink- left message with RN to notify Dr. About patient's short runs of Vtach- 9 and 11 beat run.  Was told that she would relay this information to the doctor since he is on the phone at this time.  Waiting for call back.

## 2015-10-15 NOTE — Progress Notes (Signed)
eLink Physician-Brief Progress Note Patient Name: Mitchell Morrow DOB: Dec 05, 1975 MRN: [id number]   Date of Service  10/15/2015  HPI/Events of Note  Troponin #1 = 0.60. Patient is already on Argatroban IV infusion. Patient is on a Norepinephrine IV infusion for hemodynamic support. Therefore, can't add a B-Blocker.  eICU Interventions  Will order: 1. ASA 325 mg PO Q day.  2. Continue to trend Troponin.      Intervention Category Intermediate Interventions: Diagnostic test evaluation  Lenell Antu 10/15/2015, 6:39 PM

## 2015-10-15 NOTE — Progress Notes (Signed)
RT to room to increase PEEP to 8 cmH2O per verbal order from Dr. Dema Severin.  Dr. Dema Severin also ordered repeat ABG on new PEEP settings, ABG pending.

## 2015-10-15 NOTE — Progress Notes (Signed)
ANTICOAGULATION CONSULT NOTE - Initial Consult  Pharmacy Consult for Argatroban Indication: possible PE  No Known Allergies  Patient Measurements: Height:  (175.3 cm) Weight: (!) 618 lb 2.7 oz (280.4 kg) IBW/kg (Calculated) : 70.7    Vital Signs: Temp: 99 F (37.2 C) (01/27 2130) Temp Source: Rectal (01/27 1900) BP: 96/63 mmHg (01/27 2130) Pulse Rate: 99 (01/27 2130)  Labs:  Recent Labs  10/13/15 0457  10/13/15 0607  10/14/15 0400  10/15/15 0408 10/15/15 1030 10/15/15 1306 10/15/15 1600 10/15/15 1749 10/15/15 2001  HGB  --   < > 9.9*  --  9.2*  --  9.5*  --   --   --   --   --   HCT  --   --  33.5*  --  31.2*  --  31.3*  --   --   --   --   --   PLT  --   --  45*  --  59*  --  67*  --   --   --   --   --   APTT 25  --   --   --   --   --   --   --  44*  --   --  124*  LABPROT  --   --   --   --   --   --   --   --  22.4*  --   --   --   INR  --   --   --   --   --   --   --   --  1.98  --   --   --   CREATININE 2.94*  --   --   < > 2.66*  < > 2.06* 1.99*  --  1.98*  --   --   TROPONINI  --   --   --   --   --   --   --   --   --   --  0.06*  --   < > = values in this interval not displayed.  Estimated Creatinine Clearance: 107.4 mL/min (by C-G formula based on Cr of 1.98).   Medical History: Past Medical History  Diagnosis Date  . Super obesity (HCC)   . Tobacco abuse   . Chronic systolic CHF (congestive heart failure) (HCC)   . Atrial fibrillation (HCC) 10/20/15    Medications:  Scheduled:  . ALPRAZolam  0.5 mg Oral TID  . alteplase  2 mg Intracatheter Once  . alteplase  2 mg Intracatheter Once  . amiodarone  200 mg Per Tube BID  . antiseptic oral rinse  7 mL Mouth Rinse 10 times per day  . aspirin  325 mg Oral Daily  . budesonide  0.25 mg Nebulization 4 times per day  . chlorhexidine gluconate  15 mL Mouth Rinse BID  . feeding supplement (PRO-STAT SUGAR FREE 64)  30 mL Per Tube TID  . free water  30 mL Per Tube 6 times per day  . hydrocortisone  sod succinate (SOLU-CORTEF) inj  50 mg Intravenous Q6H  . Influenza vac split quadrivalent PF  0.5 mL Intramuscular Tomorrow-1000  . ipratropium-albuterol  3 mL Nebulization Q6H  . lactulose  10 g Oral Daily  . meropenem (MERREM) IV  1 g Intravenous Q12H  . nystatin   Topical BID  . pantoprazole sodium  40 mg Per Tube BID  . senna-docusate  1 tablet  Oral BID  . vancomycin  1,500 mg Intravenous Q24H   Infusions:  . argatroban 0.25 mcg/kg/min (10/15/15 2057)  . feeding supplement (VITAL HIGH PROTEIN) 1,000 mL (10/15/15 0841)  . norepinephrine (LEVOPHED) Adult infusion 15 mcg/min (10/15/15 1540)  . pureflow 2,500 mL/hr at 10/15/15 1605    Assessment: 40 y/o morbidly obese male with respiratory failure. Patient has thrombocytopenia and possible HIT although SRA results of 1 make HIT less likely. There is some concern for clot, possible PE and patient is not a candidate for mechanical DVT prophylaxis due to size.   Child Pugh Score > or = 7  Goal of Therapy:  aPTT 45-95 seconds Monitor platelets by anticoagulation protocol: Yes   Plan:  Due to Child Pugh score > 6, will need to start argatroban at reduced rate of 0.5 mcg/kg/min. Will check APTT 3 hours after starting infusion.  1/27:  APTT @ 19:00 = 124 Will decrease gtt rate by 0.60mcg/kg/min .  Will recheck aPTT 3 hrs after rate change on 1/28 @ 00:00.   Jazmon Kos D 10/15/2015,10:02 PM

## 2015-10-15 NOTE — Progress Notes (Signed)
eLink Physician-Brief Progress Note Patient Name: Mitchell Morrow DOB: 1976-06-02 MRN: [id number]   Date of Service  10/15/2015  HPI/Events of Note  2 short runs of NSVT - 9 and 11 beats. BMP pending.   eICU Interventions  Will order: 1. Add Mg++ level to BMP. 2. Cycle Troponin.      Intervention Category Major Interventions: Arrhythmia - evaluation and management  Eliah Ozawa Dennard Nip 10/15/2015, 5:37 PM

## 2015-10-15 NOTE — Progress Notes (Signed)
Dr. Wynelle Link paged to clarify CRRT orders and discuss goals for therapy.  Current settings DFR 2.5L/hr; UF 25 ml/hr; BFR 300 ml/hr.  Per Dr. Wynelle Link, goal UF 59ml/hr and BFR 418ml/hr if patient can tolerate.

## 2015-10-15 NOTE — Progress Notes (Signed)
ANTICOAGULATION CONSULT NOTE - Initial Consult  Pharmacy Consult for Argatroban Indication: possible PE  No Known Allergies  Patient Measurements: Height:  (175.3 cm) Weight: (!) 618 lb 2.7 oz (280.4 kg) IBW/kg (Calculated) : 70.7    Vital Signs: Temp: 99 F (37.2 C) (01/27 2030) Temp Source: Rectal (01/27 1900) BP: 89/55 mmHg (01/27 2030) Pulse Rate: 98 (01/27 2030)  Labs:  Recent Labs  10/13/15 0457  10/13/15 0607  10/14/15 0400  10/15/15 0408 10/15/15 1030 10/15/15 1306 10/15/15 1600 10/15/15 1749 10/15/15 2001  HGB  --   < > 9.9*  --  9.2*  --  9.5*  --   --   --   --   --   HCT  --   --  33.5*  --  31.2*  --  31.3*  --   --   --   --   --   PLT  --   --  45*  --  59*  --  67*  --   --   --   --   --   APTT 25  --   --   --   --   --   --   --  44*  --   --  124*  LABPROT  --   --   --   --   --   --   --   --  22.4*  --   --   --   INR  --   --   --   --   --   --   --   --  1.98  --   --   --   CREATININE 2.94*  --   --   < > 2.66*  < > 2.06* 1.99*  --  1.98*  --   --   TROPONINI  --   --   --   --   --   --   --   --   --   --  0.06*  --   < > = values in this interval not displayed.  Estimated Creatinine Clearance: 107.4 mL/min (by C-G formula based on Cr of 1.98).   Medical History: Past Medical History  Diagnosis Date  . Super obesity (HCC)   . Tobacco abuse   . Chronic systolic CHF (congestive heart failure) (HCC)   . Atrial fibrillation (HCC) 11-13-2015    Medications:  Scheduled:  . ALPRAZolam  0.5 mg Oral TID  . alteplase  2 mg Intracatheter Once  . alteplase  2 mg Intracatheter Once  . amiodarone  200 mg Per Tube BID  . antiseptic oral rinse  7 mL Mouth Rinse 10 times per day  . aspirin  325 mg Oral Daily  . budesonide  0.25 mg Nebulization 4 times per day  . chlorhexidine gluconate  15 mL Mouth Rinse BID  . feeding supplement (PRO-STAT SUGAR FREE 64)  30 mL Per Tube TID  . free water  30 mL Per Tube 6 times per day  . hydrocortisone  sod succinate (SOLU-CORTEF) inj  50 mg Intravenous Q6H  . Influenza vac split quadrivalent PF  0.5 mL Intramuscular Tomorrow-1000  . ipratropium-albuterol  3 mL Nebulization Q6H  . lactulose  10 g Oral Daily  . meropenem (MERREM) IV  1 g Intravenous Q12H  . nystatin   Topical BID  . pantoprazole sodium  40 mg Per Tube BID  . senna-docusate  1 tablet  Oral BID  . vancomycin  1,500 mg Intravenous Q24H   Infusions:  . argatroban 0.5 mcg/kg/min (10/15/15 1540)  . feeding supplement (VITAL HIGH PROTEIN) 1,000 mL (10/15/15 0841)  . norepinephrine (LEVOPHED) Adult infusion 15 mcg/min (10/15/15 1540)  . pureflow 2,500 mL/hr at 10/15/15 1605    Assessment: 40 y/o morbidly obese male with respiratory failure. Patient has thrombocytopenia and possible HIT although SRA results of 1 make HIT less likely. There is some concern for clot, possible PE and patient is not a candidate for mechanical DVT prophylaxis due to size.   Child Pugh Score > or = 7  Goal of Therapy:  aPTT 45-95 seconds Monitor platelets by anticoagulation protocol: Yes   Plan:  Due to Child Pugh score > 6, will need to start argatroban at reduced rate of 0.5 mcg/kg/min. Will check APTT 3 hours after starting infusion.  1/27:  APTT @ 19:00 = 124 Will decrease gtt rate by 0.70mcg/kg/min .  Will recheck aPTT 3 hrs after rate change on 1/28  Rhylen Shaheen D 10/15/2015,8:58 PM

## 2015-10-15 NOTE — Care Management (Signed)
Obtained order for palliative care consult

## 2015-10-15 NOTE — Progress Notes (Addendum)
ANTICOAGULATION CONSULT NOTE - Initial Consult  Pharmacy Consult for Argatroban Indication: possible PE  No Known Allergies  Patient Measurements: Height:  (175.3 cm) Weight: (!) 618 lb 2.7 oz (280.4 kg) IBW/kg (Calculated) : 70.7    Vital Signs: Temp: 98.8 F (37.1 C) (01/27 1100) BP: 86/54 mmHg (01/27 1200) Pulse Rate: 97 (01/27 1200)  Labs:  Recent Labs  10/13/15 0457 10/13/15 0607  10/14/15 0400  10/14/15 2212 10/15/15 0408 10/15/15 1030  HGB  --  9.9*  --  9.2*  --   --  9.5*  --   HCT  --  33.5*  --  31.2*  --   --  31.3*  --   PLT  --  45*  --  59*  --   --  67*  --   APTT 25  --   --   --   --   --   --   --   CREATININE 2.94*  --   < > 2.66*  < > 2.16* 2.06* 1.99*  < > = values in this interval not displayed.  Estimated Creatinine Clearance: 106.8 mL/min (by C-G formula based on Cr of 1.99).   Medical History: Past Medical History  Diagnosis Date  . Super obesity (HCC)   . Tobacco abuse   . Chronic systolic CHF (congestive heart failure) (HCC)   . Atrial fibrillation (HCC) 09/2015    Medications:  Scheduled:  . ALPRAZolam  0.5 mg Oral TID  . alteplase  2 mg Intracatheter Once  . alteplase  2 mg Intracatheter Once  . amiodarone  200 mg Per Tube BID  . antiseptic oral rinse  7 mL Mouth Rinse 10 times per day  . budesonide  0.25 mg Nebulization 4 times per day  . chlorhexidine gluconate  15 mL Mouth Rinse BID  . feeding supplement (PRO-STAT SUGAR FREE 64)  30 mL Per Tube TID  . free water  30 mL Per Tube 6 times per day  . hydrocortisone sod succinate (SOLU-CORTEF) inj  50 mg Intravenous Q6H  . Influenza vac split quadrivalent PF  0.5 mL Intramuscular Tomorrow-1000  . ipratropium-albuterol  3 mL Nebulization Q6H  . lactulose  10 g Oral Daily  . meropenem (MERREM) IV  1 g Intravenous Q12H  . nystatin   Topical BID  . pantoprazole sodium  40 mg Per Tube BID  . senna-docusate  1 tablet Oral BID  . vancomycin  1,500 mg Intravenous Q24H    Infusions:  . argatroban    . feeding supplement (VITAL HIGH PROTEIN) 1,000 mL (10/15/15 0841)  . norepinephrine (LEVOPHED) Adult infusion 13.973 mcg/min (10/15/15 1100)  . pureflow 2,500 mL/hr at 10/15/15 1015    Assessment: 40 y/o morbidly obese male with respiratory failure. Patient has thrombocytopenia and possible HIT although SRA results of 1 make HIT less likely. There is some concern for clot, possible PE and patient is not a candidate for mechanical DVT prophylaxis due to size.   Child Pugh Score > or = 7  Goal of Therapy:  aPTT 45-95 seconds Monitor platelets by anticoagulation protocol: Yes   Plan:  Due to Child Pugh score > 6, will need to start argatroban at reduced rate of 0.5 mcg/kg/min. Will check APTT 3 hours after starting infusion.  Luisa Hart D 10/15/2015,12:35 PM

## 2015-10-15 NOTE — Progress Notes (Signed)
[facility]  ROUNDING NOTE   Subjective:   CRRT ran well overnight. BFR 400 UF 50 DFR 2.5K UF 628  Norepinephrine  Cefepime switched to vanco  Objective:  Vital signs in last 24 hours:  Temp:  [98.8 F (37.1 C)-100.3 F (37.9 C)] 98.8 F (37.1 C) (01/27 0800) Pulse Rate:  [94-100] 96 (01/27 0800) Resp:  [22-32] 32 (01/27 0800) BP: (88-119)/(55-79) 95/56 mmHg (01/27 0800) SpO2:  [92 %-97 %] 97 % (01/27 0818) FiO2 (%):  [70 %] 70 % (01/27 0830) Weight:  [280.4 kg (618 lb 2.7 oz)] 280.4 kg (618 lb 2.7 oz) (01/27 0500)  Weight change: -13 kg (-28 lb 10.6 oz) Filed Weights   10/13/15 0500 10/14/15 0500 10/15/15 0500  Weight: 291 kg (641 lb 8.6 oz) 293.4 kg (646 lb 13.3 oz) 280.4 kg (618 lb 2.7 oz)    Intake/Output: I/O last 3 completed shifts: In: 3720.6 [I.V.:450.6; NG/GT:2570; IV Piggyback:700] Out: 910 [Urine:65; WUJWJ:191; Stool:1]   Intake/Output this shift:  Total I/O In: 30 [NG/GT:30] Out: 48 [Other:48]  Physical Exam: General: Critically ill  Head: +OGT  Eyes: Mild icterus noted  Neck: Tracheostomy in place  Lungs:  Bilateral rhonchi, vent assisted, PRVC Fio2 70%  Heart: S1S2 no rubs  Abdomen:  Soft, nontender, obese  Extremities: 3+ generalized edema.  Neurologic: sedated  Skin: No lesions  Access Right IJ temp HD catheter 1/18 Dr. Gilda Crease  GU: Foley    Basic Metabolic Panel:  Recent Labs Lab 10/10/15 1811  10/11/15 0626  10/11/15 2006  10/14/15 0400 10/14/15 1045 10/14/15 1706 10/14/15 2212 10/15/15 0408  NA  --   < >  --   < > 139  < > 138 137 138 136 136  K  --   < >  --   < > 4.2  < > 4.6 4.5 4.3 4.2 4.2  CL  --   < >  --   < > 104  < > 103 102 101 100* 100*  CO2  --   < >  --   < > 27  < > GLUCOSE  --   < >  --   < > 135*  < > 130* 130* 116* 120* 120*  BUN  --   < >  --   < > 55*  < > 79* 81* 75* 75* 70*  CREATININE  --   < >  --   < > 2.09*  < > 2.66* 2.58* 2.35* 2.16* 2.06*  CALCIUM  --   < >  --    < > 9.1  < > 8.9 9.0 9.1 8.7* 8.7*  MG 2.1  --  2.1  --  2.0  --  2.0  --   --   --  1.9  PHOS  --   --   --   --   --   < > 3.2 3.2 3.1 UNABLE TO REPORT DUE TO ICTERUS UNABLE TO REPORT DUE TO ICTERUS  < > = values in this interval not displayed.  Liver Function Tests:  Recent Labs Lab 10/11/15 0429  10/14/15 0400 10/14/15 1045 10/14/15 1706 10/14/15 2212 10/15/15 0408  AST 176*  --   --   --   --   --   --   ALT 60  --   --   --   --   --   --   ALKPHOS 103  --   --   --   --   --   --  BILITOT 3.8*  --   --   --   --   --   --   PROT 6.9  --   --   --   --   --   --   ALBUMIN 3.0*  < > 2.5* 2.5* 2.6* 2.5* 2.4*  < > = values in this interval not displayed. No results for input(s): LIPASE, AMYLASE in the last 168 hours. No results for input(s): AMMONIA in the last 168 hours.  CBC:  Recent Labs Lab 10/12/15 0501 10/12/15 1525 10/13/15 0607 10/14/15 0400 10/15/15 0408  WBC 11.9* 14.1* 14.2* 16.8* 18.0*  HGB 9.8* 10.1* 9.9* 9.2* 9.5*  HCT 33.5* 34.4* 33.5* 31.2* 31.3*  MCV 87.0 88.4 88.8 85.1 85.5  PLT 26* 32* 45* 59* 67*    Cardiac Enzymes: No results for input(s): CKTOTAL, CKMB, CKMBINDEX, TROPONINI in the last 168 hours.  BNP: Invalid input(s): POCBNP  CBG:  Recent Labs Lab 10/13/15 2351 10/14/15 0738 10/14/15 1630 10/15/15 0020 10/15/15 0755  GLUCAP 132* 122* 110* 109* 116*    Microbiology: Results for orders placed or performed during the hospital encounter of 03-Oct-2015  Blood culture (routine x 2)     Status: None   Collection Time: 10/03/2015 11:53 PM  Result Value Ref Range Status   Specimen Description BLOOD LEFT HAND  Final   Special Requests BOTTLES DRAWN AEROBIC AND ANAEROBIC 4CC  Final   Culture NO GROWTH 5 DAYS  Final   Report Status 09/23/2015 FINAL  Final  Blood culture (routine x 2)     Status: None   Collection Time: 10-03-2015 11:53 PM  Result Value Ref Range Status   Specimen Description BLOOD LEFT ARM  Final   Special Requests  BOTTLES DRAWN AEROBIC AND ANAEROBIC 5CC  Final   Culture NO GROWTH 5 DAYS  Final   Report Status 09/23/2015 FINAL  Final  Urine culture     Status: None   Collection Time: 09/18/15 12:21 AM  Result Value Ref Range Status   Specimen Description URINE, RANDOM  Final   Special Requests NONE  Final   Culture MULTIPLE SPECIES PRESENT, SUGGEST RECOLLECTION  Final   Report Status 09/19/2015 FINAL  Final  Rapid Influenza A&B Antigens ([facility] only)     Status: None   Collection Time: 09/18/15  6:26 AM  Result Value Ref Range Status   Influenza A ([facility]) NOT DETECTED  Final   Influenza B ([facility]) NOT DETECTED  Final  MRSA PCR Screening     Status: None   Collection Time: 09/20/15  6:30 AM  Result Value Ref Range Status   MRSA by PCR NEGATIVE NEGATIVE Final    Comment:        The GeneXpert MRSA Assay (FDA approved for NASAL specimens only), is one component of a comprehensive MRSA colonization surveillance program. It is not intended to diagnose MRSA infection nor to guide or monitor treatment for MRSA infections.   Culture, respiratory (NON-Expectorated)     Status: None   Collection Time: 09/22/15  3:13 PM  Result Value Ref Range Status   Specimen Description TRACHEAL ASPIRATE  Final   Special Requests NONE  Final   Gram Stain   Final    GOOD SPECIMEN - 80-90% WBCS MODERATE WBC SEEN RARE YEAST RARE GRAM NEGATIVE COCCOBACILLI    Culture LIGHT GROWTH CANDIDA TROPICALIS  Final   Report Status 09/26/2015 FINAL  Final  Urine culture     Status: None   Collection Time: 09/22/15 11:16  PM  Result Value Ref Range Status   Specimen Description URINE, RANDOM  Final   Special Requests NONE  Final   Culture NO GROWTH 2 DAYS  Final   Report Status 09/24/2015 FINAL  Final  CULTURE, BLOOD (ROUTINE X 2) w Reflex to PCR ID Panel     Status: None   Collection Time: 09/22/15 11:44 PM  Result Value Ref Range Status   Specimen Description BLOOD RIGHT ANTECUBITAL  Final   Special Requests BOTTLES  DRAWN AEROBIC AND ANAEROBIC  Final   Culture NO GROWTH 5 DAYS  Final   Report Status 09/28/2015 FINAL  Final  CULTURE, BLOOD (ROUTINE X 2) w Reflex to PCR ID Panel     Status: None   Collection Time: 09/22/15 11:56 PM  Result Value Ref Range Status   Specimen Description BLOOD LEFT HAND  Final   Special Requests BOTTLES DRAWN AEROBIC AND ANAEROBIC  Final   Culture NO GROWTH 5 DAYS  Final   Report Status 09/28/2015 FINAL  Final  Culture, respiratory (NON-Expectorated)     Status: None   Collection Time: 09/23/15 12:01 AM  Result Value Ref Range Status   Specimen Description TRACHEAL ASPIRATE  Final   Special Requests NONE  Final   Gram Stain   Final    GOOD SPECIMEN - 80-90% WBCS MODERATE WBC SEEN RARE YEAST RARE GRAM POSITIVE COCCI    Culture Consistent with normal respiratory flora.  Final   Report Status 09/25/2015 FINAL  Final  Urine culture     Status: None   Collection Time: 10/01/15 10:35 AM  Result Value Ref Range Status   Specimen Description URINE, RANDOM  Final   Special Requests NONE  Final   Culture NO GROWTH 2 DAYS  Final   Report Status 10/03/2015 FINAL  Final  CULTURE, BLOOD (ROUTINE X 2) w Reflex to PCR ID Panel     Status: None   Collection Time: 10/01/15 10:56 AM  Result Value Ref Range Status   Specimen Description BLOOD RIGHT ARM  Final   Special Requests   Final    BOTTLES DRAWN AEROBIC AND ANAEROBIC  AER 3CC ANA 5CC   Culture NO GROWTH 5 DAYS  Final   Report Status 10/06/2015 FINAL  Final  CULTURE, BLOOD (ROUTINE X 2) w Reflex to PCR ID Panel     Status: None   Collection Time: 10/01/15 11:14 AM  Result Value Ref Range Status   Specimen Description BLOOD LEFT HAND  Final   Special Requests   Final    BOTTLES DRAWN AEROBIC AND ANAEROBIC  AER 3CC ANA 4CC   Culture  Setup Time   Final    GRAM POSITIVE COCCI ANAEROBIC BOTTLE ONLY CRITICAL RESULT CALLED TO, READ BACK BY AND VERIFIED WITH: CRYSTAL SCARPENA AT 0915 ON 10/02/15 CTJ    Culture    Final    COAGULASE NEGATIVE STAPHYLOCOCCUS ANAEROBIC BOTTLE ONLY Results consistent with contamination.    Report Status 10/06/2015 FINAL  Final  Blood Culture ID Panel (Reflexed)     Status: Abnormal   Collection Time: 10/01/15 11:14 AM  Result Value Ref Range Status   Enterococcus species NOT DETECTED NOT DETECTED Final   Listeria monocytogenes NOT DETECTED NOT DETECTED Final   Staphylococcus species DETECTED (A) NOT DETECTED Final   Staphylococcus aureus NOT DETECTED NOT DETECTED Final   Streptococcus species NOT DETECTED NOT DETECTED Final   Streptococcus agalactiae NOT DETECTED NOT DETECTED Final   Streptococcus pneumoniae NOT DETECTED  NOT DETECTED Final   Streptococcus pyogenes NOT DETECTED NOT DETECTED Final   Acinetobacter baumannii NOT DETECTED NOT DETECTED Final   Enterobacteriaceae species NOT DETECTED NOT DETECTED Final   Enterobacter cloacae complex NOT DETECTED NOT DETECTED Final   Escherichia coli NOT DETECTED NOT DETECTED Final   Klebsiella oxytoca NOT DETECTED NOT DETECTED Final   Klebsiella pneumoniae NOT DETECTED NOT DETECTED Final   Proteus species NOT DETECTED NOT DETECTED Final   Serratia marcescens NOT DETECTED NOT DETECTED Final   Haemophilus influenzae NOT DETECTED NOT DETECTED Final   Neisseria meningitidis NOT DETECTED NOT DETECTED Final   Pseudomonas aeruginosa NOT DETECTED NOT DETECTED Final   Candida albicans NOT DETECTED NOT DETECTED Final   Candida glabrata NOT DETECTED NOT DETECTED Final   Candida krusei NOT DETECTED NOT DETECTED Final   Candida parapsilosis NOT DETECTED NOT DETECTED Final   Candida tropicalis NOT DETECTED NOT DETECTED Final   Carbapenem resistance NOT DETECTED NOT DETECTED Final   Methicillin resistance DETECTED (A) NOT DETECTED Final    Comment: CRITICAL RESULT CALLED TO, READ BACK BY AND VERIFIED WITH: CRYSTAL SCARPENA FOR STAPHYLOCOCCUS SPECIES AND METHICILLIN RESISTANCE AT 0915 ON 10/02/15 CTJ    Vancomycin resistance NOT  DETECTED NOT DETECTED Final  Culture, expectorated sputum-assessment     Status: None   Collection Time: 10/01/15 11:51 AM  Result Value Ref Range Status   Specimen Description EXPECTORATED SPUTUM  Final   Special Requests Normal  Final   Sputum evaluation THIS SPECIMEN IS ACCEPTABLE FOR SPUTUM CULTURE  Final   Report Status 10/01/2015 FINAL  Final  Culture, respiratory (NON-Expectorated)     Status: None   Collection Time: 10/01/15 11:51 AM  Result Value Ref Range Status   Specimen Description EXPECTORATED SPUTUM  Final   Special Requests Normal Reflexed from [id number]  Final   Gram Stain   Final    GOOD SPECIMEN - 80-90% WBCS MODERATE WBC SEEN FEW YEAST    Culture   Final    LIGHT GROWTH CANDIDA ALBICANS LIGHT GROWTH CANDIDA DUBLINIENSIS    Report Status 10/06/2015 FINAL  Final  Culture, expectorated sputum-assessment     Status: None   Collection Time: 10/08/15  3:30 PM  Result Value Ref Range Status   Specimen Description TRACHEAL ASPIRATE  Final   Special Requests NONE  Final   Sputum evaluation THIS SPECIMEN IS ACCEPTABLE FOR SPUTUM CULTURE  Final   Report Status 10/08/2015 FINAL  Final  Culture, respiratory (NON-Expectorated)     Status: None   Collection Time: 10/08/15  3:30 PM  Result Value Ref Range Status   Specimen Description TRACHEAL ASPIRATE  Final   Special Requests NONE Reflexed from [id number]  Final   Gram Stain   Final    FAIR SPECIMEN - 70-80% WBCS FEW WBC SEEN RARE GRAM POSITIVE COCCI RARE GRAM NEGATIVE RODS    Culture Consistent with normal respiratory flora.  Final   Report Status 10/11/2015 FINAL  Final  Culture, bal-quantitative     Status: None (Preliminary result)   Collection Time: 10/12/15 12:16 PM  Result Value Ref Range Status   Specimen Description BRONCHIAL ALVEOLAR LAVAGE  Final   Special Requests Immunocompromised  Final   Gram Stain   Final    FEW WBC SEEN FEW GRAM POSITIVE COCCI FAIR SPECIMEN - 70-80% WBCS    Culture HOLDING FOR  POSSIBLE PATHOGEN  Final   Report Status PENDING  Incomplete  CULTURE, BLOOD (ROUTINE X 2) w Reflex to PCR ID Panel  Status: None (Preliminary result)   Collection Time: 10/12/15  3:59 PM  Result Value Ref Range Status   Specimen Description BLOOD RIGHT ARM  Final   Special Requests   Final    BOTTLES DRAWN AEROBIC AND ANAEROBIC 7CC AERO 9CC ANA   Culture NO GROWTH 3 DAYS  Final   Report Status PENDING  Incomplete  CULTURE, BLOOD (ROUTINE X 2) w Reflex to PCR ID Panel     Status: None (Preliminary result)   Collection Time: 10/12/15  4:07 PM  Result Value Ref Range Status   Specimen Description BLOOD LEFT HAND  Final   Special Requests   Final    BOTTLES DRAWN AEROBIC AND ANAEROBIC 7CC AERO 9CC ANA   Culture  Setup Time   Final    GRAM POSITIVE COCCI AEROBIC BOTTLE ONLY CRITICAL RESULT CALLED TO, READ BACK BY AND VERIFIED WITH: MATT MCBANE AT 1610 10/14/15 DV    Culture   Final    COAGULASE NEGATIVE STAPHYLOCOCCUS AEROBIC BOTTLE ONLY Results consistent with contamination.    Report Status PENDING  Incomplete  Blood Culture ID Panel (Reflexed)     Status: Abnormal   Collection Time: 10/12/15  4:07 PM  Result Value Ref Range Status   Enterococcus species NOT DETECTED NOT DETECTED Final   Listeria monocytogenes NOT DETECTED NOT DETECTED Final   Staphylococcus species DETECTED (A) NOT DETECTED Final    Comment: CRITICAL RESULT CALLED TO, READ BACK BY AND VERIFIED WITH: MATT MCBANE AT 9604 10/14/15 DV    Staphylococcus aureus NOT DETECTED NOT DETECTED Final   Streptococcus species NOT DETECTED NOT DETECTED Final   Streptococcus agalactiae NOT DETECTED NOT DETECTED Final   Streptococcus pneumoniae NOT DETECTED NOT DETECTED Final   Streptococcus pyogenes NOT DETECTED NOT DETECTED Final   Acinetobacter baumannii NOT DETECTED NOT DETECTED Final   Enterobacteriaceae species NOT DETECTED NOT DETECTED Final   Enterobacter cloacae complex NOT DETECTED NOT DETECTED Final   Escherichia  coli NOT DETECTED NOT DETECTED Final   Klebsiella oxytoca NOT DETECTED NOT DETECTED Final   Klebsiella pneumoniae NOT DETECTED NOT DETECTED Final   Proteus species NOT DETECTED NOT DETECTED Final   Serratia marcescens NOT DETECTED NOT DETECTED Final   Haemophilus influenzae NOT DETECTED NOT DETECTED Final   Neisseria meningitidis NOT DETECTED NOT DETECTED Final   Pseudomonas aeruginosa NOT DETECTED NOT DETECTED Final   Candida albicans NOT DETECTED NOT DETECTED Final   Candida glabrata NOT DETECTED NOT DETECTED Final   Candida krusei NOT DETECTED NOT DETECTED Final   Candida parapsilosis NOT DETECTED NOT DETECTED Final   Candida tropicalis NOT DETECTED NOT DETECTED Final   Carbapenem resistance NOT DETECTED NOT DETECTED Final   Methicillin resistance DETECTED (A) NOT DETECTED Final    Comment: CRITICAL RESULT CALLED TO, READ BACK BY AND VERIFIED WITH: MATT MCBANE AT 5409 10/14/15 DV    Vancomycin resistance NOT DETECTED NOT DETECTED Final    Coagulation Studies: No results for input(s): LABPROT, INR in the last 72 hours.  Urinalysis: No results for input(s): COLORURINE, LABSPEC, PHURINE, GLUCOSEU, HGBUR, BILIRUBINUR, KETONESUR, PROTEINUR, UROBILINOGEN, NITRITE, LEUKOCYTESUR in the last 72 hours.  Invalid input(s): APPERANCEUR    Imaging: Dg Chest Port 1 View  10/14/2015  CLINICAL DATA:  Respiratory failure. EXAM: PORTABLE CHEST 1 VIEW COMPARISON:  October 11, 2015 FINDINGS: There is a There is a new line entering via a left subclavian approach terminating in the SVC. A the right central line is stable. No pneumothorax. Increased interstitial markings in  the lungs consistent with edema. The left retrocardiac region is not well assessed but there is obscuration of the left hemidiaphragm, stable. No change in the cardiomediastinal silhouette. IMPRESSION: There is a new left-sided line in good position. No pneumothorax. Continued of pulmonary edema. Electronically Signed   By: Gerome Sam III M.D   On: 10/14/2015 07:24     Medications:   . feeding supplement (VITAL HIGH PROTEIN) 1,000 mL (10/15/15 0841)  . norepinephrine (LEVOPHED) Adult infusion 15 mcg/min (10/14/15 1900)  . pureflow 3 each (10/15/15 0400)   . ALPRAZolam  0.5 mg Oral TID  . alteplase  2 mg Intracatheter Once  . alteplase  2 mg Intracatheter Once  . amiodarone  200 mg Per Tube BID  . antiseptic oral rinse  7 mL Mouth Rinse 10 times per day  . budesonide  0.25 mg Nebulization 4 times per day  . chlorhexidine gluconate  15 mL Mouth Rinse BID  . feeding supplement (PRO-STAT SUGAR FREE 64)  30 mL Per Tube TID  . free water  30 mL Per Tube 6 times per day  . hydrocortisone sod succinate (SOLU-CORTEF) inj  50 mg Intravenous Q6H  . Influenza vac split quadrivalent PF  0.5 mL Intramuscular Tomorrow-1000  . ipratropium-albuterol  3 mL Nebulization Q6H  . meropenem (MERREM) IV  1 g Intravenous Q12H  . nystatin   Topical BID  . pantoprazole sodium  40 mg Per Tube BID  . senna-docusate  1 tablet Oral BID  . vancomycin  1,500 mg Intravenous Q24H   acetaminophen **OR** acetaminophen, albuterol, fentaNYL (SUBLIMAZE) injection, metoprolol, midazolam, ondansetron **OR** ondansetron (ZOFRAN) IV, sodium chloride  Assessment/ Plan:  Mr. Mitchell Morrow is a 40 y.o. black male with morbid obesity, tobacco abuse, without prescribed home medications, who was admitted to [facility] on 2015-09-28, s/p tracheostomy placement, persistent respiratory failure, multiple episodes of ARF  1. Acute Renal Failure:  Second episode of ARF, reconsulted on 10/05/15, acute renal failure anuric. Baseline creatinine of 1.15 on admission. Started renal replacement therapy 10/06/15, trial of intermittent hemodialysis 1/24. Restarted 1/25 CRRT.   - Continue CRRT. Patient hemodynamically unstable. Oliguric.  - Continue to monitor volume status, urine output, serum electrolytes and renal function.  - Renally dose all medications   2. Acute  hypercapnic respiratory failure with bilateral pneumonia and acute exacerbation of systolic congestive heart failure with respiratory acidosis:  With underlying obesity hypoventilation syndrome. Tracheostomy 09/26/2015. Ventilator dependent.  - Appreciate pulm input.   3.  Anemia with renal failure: hemoglobin down to 9.2. With thrombocytopenia - No acute indication for epo or transfusion. - Avoid heparin products. HIT pending - Will give TPA and monitor closely.   4. Hypotension/sepsis: on vasopressors: norepinephrine. Wbc trending upward. Blood cultures showing MRSA - merepenem and vanco   LOS: 28 Mitchell Morrow 1/27/20179:06 AM

## 2015-10-15 NOTE — Progress Notes (Signed)
ABG results 7.23, 58, 78, 24.  Dr. Belia Heman ordered Vt increased to 550 per ABG results.

## 2015-10-15 NOTE — Progress Notes (Signed)
Notified Dr. 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 Pt on vancomycin and meropenem-  Order to place patient on contact precautions.

## 2015-10-15 NOTE — Progress Notes (Signed)
Notifed Dr. Wynelle Link about [facility] unable to place PICC or IJ at this time- due to patient obesity, on CRRT and requiring a high dosage of levophed- which levophed could not be held during the placement procedure.  Order to continue to use central line that is in place.

## 2015-10-15 NOTE — Progress Notes (Signed)
PHARMACY - CRITICAL CARE PROGRESS NOTE  Pharmacy Consult for Meropenem, Vancomycin, CRRT medication management, Constipation Prevention, Electrolyte Management      No Known Allergies  Patient Measurements: Height: 5\' 9"  (175.3 cm) Weight: (!) 618 lb 2.7 oz (280.4 kg) IBW/kg (Calculated) : 70.7   Vital Signs: Temp: 98.8 F (37.1 C) (01/27 1100) BP: 86/54 mmHg (01/27 1200) Pulse Rate: 97 (01/27 1200) Intake/Output from previous day: 01/26 0701 - 01/27 0700 In: 2646.7 [I.V.:296.7; NG/GT:1700; IV Piggyback:650] Out: 697 [Urine:20; Stool:1] Intake/Output from this shift: Total I/O In: 673.1 [I.V.:13.1; NG/GT:60; IV Piggyback:600] Out: 286 [Other:286] Vent settings for last 24 hours: Vent Mode:  [-] PRVC FiO2 (%):  [70 %] 70 % Set Rate:  [30 bmp] 30 bmp Vt Set:  [500 mL] 500 mL PEEP:  [6 cmH20-8 cmH20] 8 cmH20 Plateau Pressure:  [16 cmH20-34 cmH20] 34 cmH20  Labs:  Recent Labs  10/13/15 0457 10/13/15 0607  10/14/15 0400  10/14/15 1706 10/14/15 2212 10/15/15 0408 10/15/15 1030 10/15/15 1215 10/15/15 1306  WBC  --  14.2*  --  16.8*  --   --   --  18.0*  --   --   --   HGB  --  9.9*  --  9.2*  --   --   --  9.5*  --   --   --   HCT  --  33.5*  --  31.2*  --   --   --  31.3*  --   --   --   PLT  --  45*  --  59*  --   --   --  67*  --   --   --   APTT 25  --   --   --   --   --   --   --   --   --  44*  INR  --   --   --   --   --   --   --   --   --   --  1.98  CREATININE 2.94*  --   < > 2.66*  < > 2.35* 2.16* 2.06* 1.99*  --   --   MG  --   --   --  2.0  --   --   --  1.9  --   --   --   PHOS  --   --   < > 3.2  < > 3.1 UNABLE TO REPORT DUE TO ICTERUS UNABLE TO REPORT DUE TO ICTERUS  --   --   --   ALBUMIN  --   --   < > 2.5*  < > 2.6* 2.5* 2.4* 2.3* 2.3*  --   PROT  --   --   --   --   --   --   --   --   --  6.5  --   AST  --   --   --   --   --   --   --   --   --  2266*  --   ALT  --   --   --   --   --   --   --   --   --  1115*  --   ALKPHOS  --   --   --    --   --   --   --   --   --  134*  --  BILITOT  --   --   --   --   --   --   --   --   --  16.0*  --   BILIDIR  --   --   --   --   --   --   --   --   --  13.5*  --   IBILI  --   --   --   --   --   --   --   --   --  2.5*  --   < > = values in this interval not displayed. Estimated Creatinine Clearance: 106.8 mL/min (by C-G formula based on Cr of 1.99).   Recent Labs  10/14/15 1630 10/15/15 0020 10/15/15 0755  GLUCAP 110* 109* 116*    Microbiology: Recent Results (from the past 720 hour(s))  Blood culture (routine x 2)     Status: None   Collection Time: 09/15/2015 11:53 PM  Result Value Ref Range Status   Specimen Description BLOOD LEFT HAND  Final   Special Requests BOTTLES DRAWN AEROBIC AND ANAEROBIC 4CC  Final   Culture NO GROWTH 5 DAYS  Final   Report Status 09/23/2015 FINAL  Final  Blood culture (routine x 2)     Status: None   Collection Time: 09/01/2015 11:53 PM  Result Value Ref Range Status   Specimen Description BLOOD LEFT ARM  Final   Special Requests BOTTLES DRAWN AEROBIC AND ANAEROBIC 5CC  Final   Culture NO GROWTH 5 DAYS  Final   Report Status 09/23/2015 FINAL  Final  Urine culture     Status: None   Collection Time: 09/18/15 12:21 AM  Result Value Ref Range Status   Specimen Description URINE, RANDOM  Final   Special Requests NONE  Final   Culture MULTIPLE SPECIES PRESENT, SUGGEST RECOLLECTION  Final   Report Status 09/19/2015 FINAL  Final  Rapid Influenza A&B Antigens ([facility] only)     Status: None   Collection Time: 09/18/15  6:26 AM  Result Value Ref Range Status   Influenza A ([facility]) NOT DETECTED  Final   Influenza B ([facility]) NOT DETECTED  Final  MRSA PCR Screening     Status: None   Collection Time: 09/20/15  6:30 AM  Result Value Ref Range Status   MRSA by PCR NEGATIVE NEGATIVE Final    Comment:        The GeneXpert MRSA Assay (FDA approved for NASAL specimens only), is one component of a comprehensive MRSA colonization surveillance program. It is  not intended to diagnose MRSA infection nor to guide or monitor treatment for MRSA infections.   Culture, respiratory (NON-Expectorated)     Status: None   Collection Time: 09/22/15  3:13 PM  Result Value Ref Range Status   Specimen Description TRACHEAL ASPIRATE  Final   Special Requests NONE  Final   Gram Stain   Final    GOOD SPECIMEN - 80-90% WBCS MODERATE WBC SEEN RARE YEAST RARE GRAM NEGATIVE COCCOBACILLI    Culture LIGHT GROWTH CANDIDA TROPICALIS  Final   Report Status 09/26/2015 FINAL  Final  Urine culture     Status: None   Collection Time: 09/22/15 11:16 PM  Result Value Ref Range Status   Specimen Description URINE, RANDOM  Final   Special Requests NONE  Final   Culture NO GROWTH 2 DAYS  Final   Report Status 09/24/2015 FINAL  Final  CULTURE, BLOOD (ROUTINE X 2) w Reflex to  PCR ID Panel     Status: None   Collection Time: 09/22/15 11:44 PM  Result Value Ref Range Status   Specimen Description BLOOD RIGHT ANTECUBITAL  Final   Special Requests BOTTLES DRAWN AEROBIC AND ANAEROBIC  Final   Culture NO GROWTH 5 DAYS  Final   Report Status 09/28/2015 FINAL  Final  CULTURE, BLOOD (ROUTINE X 2) w Reflex to PCR ID Panel     Status: None   Collection Time: 09/22/15 11:56 PM  Result Value Ref Range Status   Specimen Description BLOOD LEFT HAND  Final   Special Requests BOTTLES DRAWN AEROBIC AND ANAEROBIC  Final   Culture NO GROWTH 5 DAYS  Final   Report Status 09/28/2015 FINAL  Final  Culture, respiratory (NON-Expectorated)     Status: None   Collection Time: 09/23/15 12:01 AM  Result Value Ref Range Status   Specimen Description TRACHEAL ASPIRATE  Final   Special Requests NONE  Final   Gram Stain   Final    GOOD SPECIMEN - 80-90% WBCS MODERATE WBC SEEN RARE YEAST RARE GRAM POSITIVE COCCI    Culture Consistent with normal respiratory flora.  Final   Report Status 09/25/2015 FINAL  Final  Urine culture     Status: None   Collection Time: 10/01/15 10:35 AM   Result Value Ref Range Status   Specimen Description URINE, RANDOM  Final   Special Requests NONE  Final   Culture NO GROWTH 2 DAYS  Final   Report Status 10/03/2015 FINAL  Final  CULTURE, BLOOD (ROUTINE X 2) w Reflex to PCR ID Panel     Status: None   Collection Time: 10/01/15 10:56 AM  Result Value Ref Range Status   Specimen Description BLOOD RIGHT ARM  Final   Special Requests   Final    BOTTLES DRAWN AEROBIC AND ANAEROBIC  AER 3CC ANA 5CC   Culture NO GROWTH 5 DAYS  Final   Report Status 10/06/2015 FINAL  Final  CULTURE, BLOOD (ROUTINE X 2) w Reflex to PCR ID Panel     Status: None   Collection Time: 10/01/15 11:14 AM  Result Value Ref Range Status   Specimen Description BLOOD LEFT HAND  Final   Special Requests   Final    BOTTLES DRAWN AEROBIC AND ANAEROBIC  AER 3CC ANA 4CC   Culture  Setup Time   Final    GRAM POSITIVE COCCI ANAEROBIC BOTTLE ONLY CRITICAL RESULT CALLED TO, READ BACK BY AND VERIFIED WITH: CRYSTAL SCARPENA AT 0915 ON 10/02/15 CTJ    Culture   Final    COAGULASE NEGATIVE STAPHYLOCOCCUS ANAEROBIC BOTTLE ONLY Results consistent with contamination.    Report Status 10/06/2015 FINAL  Final  Blood Culture ID Panel (Reflexed)     Status: Abnormal   Collection Time: 10/01/15 11:14 AM  Result Value Ref Range Status   Enterococcus species NOT DETECTED NOT DETECTED Final   Listeria monocytogenes NOT DETECTED NOT DETECTED Final   Staphylococcus species DETECTED (A) NOT DETECTED Final   Staphylococcus aureus NOT DETECTED NOT DETECTED Final   Streptococcus species NOT DETECTED NOT DETECTED Final   Streptococcus agalactiae NOT DETECTED NOT DETECTED Final   Streptococcus pneumoniae NOT DETECTED NOT DETECTED Final   Streptococcus pyogenes NOT DETECTED NOT DETECTED Final   Acinetobacter baumannii NOT DETECTED NOT DETECTED Final   Enterobacteriaceae species NOT DETECTED NOT DETECTED Final   Enterobacter cloacae complex NOT DETECTED NOT DETECTED Final   Escherichia coli  NOT DETECTED NOT DETECTED  Final   Klebsiella oxytoca NOT DETECTED NOT DETECTED Final   Klebsiella pneumoniae NOT DETECTED NOT DETECTED Final   Proteus species NOT DETECTED NOT DETECTED Final   Serratia marcescens NOT DETECTED NOT DETECTED Final   Haemophilus influenzae NOT DETECTED NOT DETECTED Final   Neisseria meningitidis NOT DETECTED NOT DETECTED Final   Pseudomonas aeruginosa NOT DETECTED NOT DETECTED Final   Candida albicans NOT DETECTED NOT DETECTED Final   Candida glabrata NOT DETECTED NOT DETECTED Final   Candida krusei NOT DETECTED NOT DETECTED Final   Candida parapsilosis NOT DETECTED NOT DETECTED Final   Candida tropicalis NOT DETECTED NOT DETECTED Final   Carbapenem resistance NOT DETECTED NOT DETECTED Final   Methicillin resistance DETECTED (A) NOT DETECTED Final    Comment: CRITICAL RESULT CALLED TO, READ BACK BY AND VERIFIED WITH: CRYSTAL SCARPENA FOR STAPHYLOCOCCUS SPECIES AND METHICILLIN RESISTANCE AT 0915 ON 10/02/15 CTJ    Vancomycin resistance NOT DETECTED NOT DETECTED Final  Culture, expectorated sputum-assessment     Status: None   Collection Time: 10/01/15 11:51 AM  Result Value Ref Range Status   Specimen Description EXPECTORATED SPUTUM  Final   Special Requests Normal  Final   Sputum evaluation THIS SPECIMEN IS ACCEPTABLE FOR SPUTUM CULTURE  Final   Report Status 10/01/2015 FINAL  Final  Culture, respiratory (NON-Expectorated)     Status: None   Collection Time: 10/01/15 11:51 AM  Result Value Ref Range Status   Specimen Description EXPECTORATED SPUTUM  Final   Special Requests Normal Reflexed from [id number]  Final   Gram Stain   Final    GOOD SPECIMEN - 80-90% WBCS MODERATE WBC SEEN FEW YEAST    Culture   Final    LIGHT GROWTH CANDIDA ALBICANS LIGHT GROWTH CANDIDA DUBLINIENSIS    Report Status 10/06/2015 FINAL  Final  Culture, expectorated sputum-assessment     Status: None   Collection Time: 10/08/15  3:30 PM  Result Value Ref Range Status    Specimen Description TRACHEAL ASPIRATE  Final   Special Requests NONE  Final   Sputum evaluation THIS SPECIMEN IS ACCEPTABLE FOR SPUTUM CULTURE  Final   Report Status 10/08/2015 FINAL  Final  Culture, respiratory (NON-Expectorated)     Status: None   Collection Time: 10/08/15  3:30 PM  Result Value Ref Range Status   Specimen Description TRACHEAL ASPIRATE  Final   Special Requests NONE Reflexed from [id number]  Final   Gram Stain   Final    FAIR SPECIMEN - 70-80% WBCS FEW WBC SEEN RARE GRAM POSITIVE COCCI RARE GRAM NEGATIVE RODS    Culture Consistent with normal respiratory flora.  Final   Report Status 10/11/2015 FINAL  Final  Culture, bal-quantitative     Status: None (Preliminary result)   Collection Time: 10/12/15 12:16 PM  Result Value Ref Range Status   Specimen Description BRONCHIAL ALVEOLAR LAVAGE  Final   Special Requests Immunocompromised  Final   Gram Stain   Final    FEW WBC SEEN FEW GRAM POSITIVE COCCI FAIR SPECIMEN - 70-80% WBCS    Culture HOLDING FOR POSSIBLE PATHOGEN  Final   Report Status PENDING  Incomplete  CULTURE, BLOOD (ROUTINE X 2) w Reflex to PCR ID Panel     Status: None (Preliminary result)   Collection Time: 10/12/15  3:59 PM  Result Value Ref Range Status   Specimen Description BLOOD RIGHT ARM  Final   Special Requests   Final    BOTTLES DRAWN AEROBIC AND ANAEROBIC 7CC AERO 9CC ANA  Culture NO GROWTH 3 DAYS  Final   Report Status PENDING  Incomplete  CULTURE, BLOOD (ROUTINE X 2) w Reflex to PCR ID Panel     Status: None (Preliminary result)   Collection Time: 10/12/15  4:07 PM  Result Value Ref Range Status   Specimen Description BLOOD LEFT HAND  Final   Special Requests   Final    BOTTLES DRAWN AEROBIC AND ANAEROBIC 7CC AERO 9CC ANA   Culture  Setup Time   Final    GRAM POSITIVE COCCI AEROBIC BOTTLE ONLY CRITICAL RESULT CALLED TO, READ BACK BY AND VERIFIED WITH: MATT MCBANE AT 1610 10/14/15 DV    Culture   Final    COAGULASE NEGATIVE  STAPHYLOCOCCUS AEROBIC BOTTLE ONLY Results consistent with contamination.    Report Status PENDING  Incomplete  Blood Culture ID Panel (Reflexed)     Status: Abnormal   Collection Time: 10/12/15  4:07 PM  Result Value Ref Range Status   Enterococcus species NOT DETECTED NOT DETECTED Final   Listeria monocytogenes NOT DETECTED NOT DETECTED Final   Staphylococcus species DETECTED (A) NOT DETECTED Final    Comment: CRITICAL RESULT CALLED TO, READ BACK BY AND VERIFIED WITH: MATT MCBANE AT 9604 10/14/15 DV    Staphylococcus aureus NOT DETECTED NOT DETECTED Final   Streptococcus species NOT DETECTED NOT DETECTED Final   Streptococcus agalactiae NOT DETECTED NOT DETECTED Final   Streptococcus pneumoniae NOT DETECTED NOT DETECTED Final   Streptococcus pyogenes NOT DETECTED NOT DETECTED Final   Acinetobacter baumannii NOT DETECTED NOT DETECTED Final   Enterobacteriaceae species NOT DETECTED NOT DETECTED Final   Enterobacter cloacae complex NOT DETECTED NOT DETECTED Final   Escherichia coli NOT DETECTED NOT DETECTED Final   Klebsiella oxytoca NOT DETECTED NOT DETECTED Final   Klebsiella pneumoniae NOT DETECTED NOT DETECTED Final   Proteus species NOT DETECTED NOT DETECTED Final   Serratia marcescens NOT DETECTED NOT DETECTED Final   Haemophilus influenzae NOT DETECTED NOT DETECTED Final   Neisseria meningitidis NOT DETECTED NOT DETECTED Final   Pseudomonas aeruginosa NOT DETECTED NOT DETECTED Final   Candida albicans NOT DETECTED NOT DETECTED Final   Candida glabrata NOT DETECTED NOT DETECTED Final   Candida krusei NOT DETECTED NOT DETECTED Final   Candida parapsilosis NOT DETECTED NOT DETECTED Final   Candida tropicalis NOT DETECTED NOT DETECTED Final   Carbapenem resistance NOT DETECTED NOT DETECTED Final   Methicillin resistance DETECTED (A) NOT DETECTED Final    Comment: CRITICAL RESULT CALLED TO, READ BACK BY AND VERIFIED WITH: MATT MCBANE AT 5409 10/14/15 DV    Vancomycin resistance  NOT DETECTED NOT DETECTED Final    Medications:  Scheduled:  . ALPRAZolam  0.5 mg Oral TID  . alteplase  2 mg Intracatheter Once  . alteplase  2 mg Intracatheter Once  . amiodarone  200 mg Per Tube BID  . antiseptic oral rinse  7 mL Mouth Rinse 10 times per day  . budesonide  0.25 mg Nebulization 4 times per day  . chlorhexidine gluconate  15 mL Mouth Rinse BID  . feeding supplement (PRO-STAT SUGAR FREE 64)  30 mL Per Tube TID  . free water  30 mL Per Tube 6 times per day  . hydrocortisone sod succinate (SOLU-CORTEF) inj  50 mg Intravenous Q6H  . Influenza vac split quadrivalent PF  0.5 mL Intramuscular Tomorrow-1000  . ipratropium-albuterol  3 mL Nebulization Q6H  . lactulose  10 g Oral Daily  . meropenem (MERREM) IV  1  g Intravenous Q12H  . nystatin   Topical BID  . pantoprazole sodium  40 mg Per Tube BID  . senna-docusate  1 tablet Oral BID  . vancomycin  1,500 mg Intravenous Q24H   Infusions:  . argatroban    . feeding supplement (VITAL HIGH PROTEIN) 1,000 mL (10/15/15 0841)  . norepinephrine (LEVOPHED) Adult infusion 13.973 mcg/min (10/15/15 1100)  . pureflow 2,500 mL/hr at 10/15/15 1015    Assessment: Pharmacy consulted for medication management for 40 yo male critical care patient requiring CRRT and mechanical ventilation.   Plan:  1. Patient previously ordered cefepime, started 1/24. ID transitioned to meropenem on 1/26. Will continue patient on meropenem 1g IV Q12hr.    2. Patient started on vancomycin  IV Q24hr while on CRRT. Patient's CRRT access in tenuous. Will continue to check random levels prior to dosing. 01/27 random level is 18 mcg/ml. Will continue with current vancomycin dosing and check another random level tomorrow.   3. No further adjustments warranted at this time.    4. Patient without BM for several days. Continue senna/docusate 1 tab BID. Will add lactulose 10 g daily.   5. No replacement warranted at this time. Will recheck with am  labs.  Pharmacy will continue to monitor and adjust per consult.     Luisa Hart D 10/15/2015,3:20 PM

## 2015-10-15 NOTE — Progress Notes (Signed)
Notified Elink about troponin 0.06. RN stated that Dr. Arsenio Loader would call me back shortly.

## 2015-10-15 NOTE — Consult Note (Signed)
[facility]ious [facility]     Reason for Consult:Fever, leukocytosis Referring Physician: Bertell Maria  Date of Admission:  08/27/2015   Active Problems:   Pneumonia   Acute respiratory failure (HCC)   CHF (congestive heart failure) (HCC)   Acute encephalopathy   Cocaine abuse   Orthostatic hypotension   Atrial fibrillation with RVR (HCC)   Bilateral pneumonia   Respiratory failure ([place])   Morbid obesity due to excess calories ([place])   Encounter for nasogastric tube placement   HPI: Mitchell Morrow is a 40 y.o. male with massive obesity, CHF admitted 12/30 with UE and LE edema, fatigue ad dark urine as well as cough. Fond to be hypoxic, with bil PNA.  Has been hospitalized for the last 4 weeks and is now vent dependent with trach, has NGT, CRRT  and developed recurrent fevers, leukocytosis, and hypotension requiring pressors.  He was started on vanco and cefepime and change 1/26 to meropenem. [place] with CNS.  Sputum cx pending. He has a HD line in place for CRRT.  Has chronic foley. Has several areas of skin breakdown but no decubitus ulcers or areas of purulence per RN.  Past Medical History  Diagnosis Date  . Super obesity ([place])   . Tobacco abuse   . Chronic systolic CHF (congestive heart failure) ([place])   . Atrial fibrillation ([place]) 09/2015   Past Surgical History  Procedure Laterality Date  . Tracheostomy tube placement N/A 09/19/2015    Procedure: TRACHEOSTOMY;  Surgeon: Margaretha Sheffield, MD;  Location: [facility] ORS;  Service: ENT;  Laterality: N/A;   Social History  Substance Use Topics  . Smoking status: Current Every Day Smoker  . Smokeless tobacco: None  . Alcohol Use: No   Family History  Problem Relation Age of Onset  . Diabetes Other     Allergies: No Known Allergies  Current antibiotics: Antibiotics Given (last 72 hours)    Date/Time Action Medication Dose Rate   10/12/15 1641 Given  [pt waiting for blood cultures.]   ceFEPIme (MAXIPIME) 2 g in dextrose 5 % 50  mL IVPB 2 g 100 mL/hr   10/12/15 2238 Given   ceFEPIme (MAXIPIME) 2 g in dextrose 5 % 50 mL IVPB 2 g 100 mL/hr   10/13/15 0518 Given   ceFEPIme (MAXIPIME) 2 g in dextrose 5 % 50 mL IVPB 2 g 100 mL/hr   10/13/15 1128 Given   vancomycin (VANCOCIN) 1,500 mg in sodium chloride 0.9 % 500 mL IVPB 1,500 mg 250 mL/hr   10/13/15 2230 Given   ceFEPIme (MAXIPIME) 2 g in dextrose 5 % 50 mL IVPB 2 g 100 mL/hr   10/14/15 1017 Given   ceFEPIme (MAXIPIME) 2 g in dextrose 5 % 50 mL IVPB 2 g 100 mL/hr   10/14/15 1240 Given   vancomycin (VANCOCIN) 1,500 mg in sodium chloride 0.9 % 500 mL IVPB 1,500 mg 250 mL/hr   10/14/15 2200 Given   meropenem (MERREM) 1 g in sodium chloride 0.9 % 100 mL IVPB 1 g 200 mL/hr   10/15/15 0909 Given   meropenem (MERREM) 1 g in sodium chloride 0.9 % 100 mL IVPB 1 g 200 mL/hr   10/15/15 1436 Given   vancomycin (VANCOCIN) 1,500 mg in sodium chloride 0.9 % 500 mL IVPB 1,500 mg 250 mL/hr      MEDICATIONS: . ALPRAZolam  0.5 mg Oral TID  . alteplase  2 mg Intracatheter Once  . alteplase  2 mg Intracatheter Once  . amiodarone  200  mg Per Tube BID  . antiseptic oral rinse  7 mL Mouth Rinse 10 times per day  . budesonide  0.25 mg Nebulization 4 times per day  . chlorhexidine gluconate  15 mL Mouth Rinse BID  . feeding supplement (PRO-STAT SUGAR FREE 64)  30 mL Per Tube TID  . free water  30 mL Per Tube 6 times per day  . hydrocortisone sod succinate (SOLU-CORTEF) inj  50 mg Intravenous Q6H  . Influenza vac split quadrivalent PF  0.5 mL Intramuscular Tomorrow-1000  . ipratropium-albuterol  3 mL Nebulization Q6H  . lactulose  10 g Oral Daily  . meropenem (MERREM) IV  1 g Intravenous Q12H  . nystatin   Topical BID  . pantoprazole sodium  40 mg Per Tube BID  . senna-docusate  1 tablet Oral BID  . vancomycin  1,500 mg Intravenous Q24H    Review of Systems - unable to obtain   OBJECTIVE: Temp:  [98.8 F (37.1 C)-100.3 F (37.9 C)] 98.8 F (37.1 C) (01/27 1100) Pulse Rate:   [94-100] 97 (01/27 1200) Resp:  [23-32] 31 (01/27 1200) BP: (86-119)/(54-79) 86/54 mmHg (01/27 1200) SpO2:  [92 %-97 %] 96 % (01/27 1200) FiO2 (%):  [70 %] 70 % (01/27 1148) Weight:  [280.4 kg (618 lb 2.7 oz)] 280.4 kg (618 lb 2.7 oz) (01/27 0500) Physical Exam  Constitutional:massive obesity, wt > 600 pounds.  Unresponsive, Trach in place. On CRRT through R neck line, l neck line also in place. NGT in place, foley in place  HENT: anicteric, NGT Mouth/Throat: ETT in place Cardiovascular: Too distant to appreciate Pulmonary/Chest:mech breath sounds   Abdominal: massive pannus Lymphadenopathy:  He has no cervical adenopathy.  Neurological: unrespnsive  Ext/ skin  anaarca with peau d'orange skin changes to thighs with some erythema  Psychiatric: unresponsive  LABS: Results for orders placed or performed during the hospital encounter of 09/15/2015 (from the past 48 hour(s))  Renal function panel (daily at 1600)     Status: Abnormal   Collection Time: 10/13/15  4:15 PM  Result Value Ref Range   Sodium 138 135 - 145 mmol/L   Potassium 4.6 3.5 - 5.1 mmol/L   Chloride 100 (L) 101 - 111 mmol/L   CO2 24 22 - 32 mmol/L   Glucose, Bld 114 (H) 65 - 99 mg/dL   BUN 81 (H) 6 - 20 mg/dL   Creatinine, Ser 2.96 (H) 0.61 - 1.24 mg/dL   Calcium 8.9 8.9 - 10.3 mg/dL   Phosphorus 3.4 2.5 - 4.6 mg/dL   Albumin 2.6 (L) 3.5 - 5.0 g/dL   GFR calc non Af Amer 25 (L) >60 mL/min   GFR calc Af Amer 29 (L) >60 mL/min    Comment: (NOTE) The eGFR has been calculated using the CKD EPI equation. This calculation has not been validated in all clinical situations. eGFR's persistently <60 mL/min signify possible Chronic Kidney Disease.    Anion gap 14 5 - 15  Glucose, capillary     Status: Abnormal   Collection Time: 10/13/15  4:17 PM  Result Value Ref Range   Glucose-Capillary 107 (H) 65 - 99 mg/dL  Renal function panel     Status: Abnormal   Collection Time: 10/13/15 10:28 PM  Result Value Ref Range    Sodium 137 135 - 145 mmol/L   Potassium 4.5 3.5 - 5.1 mmol/L   Chloride 102 101 - 111 mmol/L   CO2 23 22 - 32 mmol/L   Glucose, Bld 126 (H)  65 - 99 mg/dL   BUN 78 (H) 6 - 20 mg/dL   Creatinine, Ser 2.70 (H) 0.61 - 1.24 mg/dL   Calcium 8.9 8.9 - 10.3 mg/dL   Phosphorus 3.1 2.5 - 4.6 mg/dL    Comment: ICTERUS AT THIS LEVEL MAY AFFECT RESULT   Albumin 2.6 (L) 3.5 - 5.0 g/dL   GFR calc non Af Amer 28 (L) >60 mL/min   GFR calc Af Amer 32 (L) >60 mL/min    Comment: (NOTE) The eGFR has been calculated using the CKD EPI equation. This calculation has not been validated in all clinical situations. eGFR's persistently <60 mL/min signify possible Chronic Kidney Disease.    Anion gap 12 5 - 15  Glucose, capillary     Status: Abnormal   Collection Time: 10/13/15 11:51 PM  Result Value Ref Range   Glucose-Capillary 132 (H) 65 - 99 mg/dL  Magnesium     Status: None   Collection Time: 10/14/15  4:00 AM  Result Value Ref Range   Magnesium 2.0 1.7 - 2.4 mg/dL  CBC     Status: Abnormal   Collection Time: 10/14/15  4:00 AM  Result Value Ref Range   WBC 16.8 (H) 3.8 - 10.6 K/uL   RBC 3.67 (L) 4.40 - 5.90 MIL/uL   Hemoglobin 9.2 (L) 13.0 - 18.0 g/dL   HCT 31.2 (L) 40.0 - 52.0 %   MCV 85.1 80.0 - 100.0 fL   MCH 25.1 (L) 26.0 - 34.0 pg   MCHC 29.5 (L) 32.0 - 36.0 g/dL   RDW 23.7 (H) 11.5 - 14.5 %   Platelets 59 (L) 150 - 440 K/uL  Renal function panel     Status: Abnormal   Collection Time: 10/14/15  4:00 AM  Result Value Ref Range   Sodium 138 135 - 145 mmol/L   Potassium 4.6 3.5 - 5.1 mmol/L   Chloride 103 101 - 111 mmol/L   CO2 26 22 - 32 mmol/L   Glucose, Bld 130 (H) 65 - 99 mg/dL   BUN 79 (H) 6 - 20 mg/dL   Creatinine, Ser 2.66 (H) 0.61 - 1.24 mg/dL   Calcium 8.9 8.9 - 10.3 mg/dL   Phosphorus 3.2 2.5 - 4.6 mg/dL    Comment: ICTERUS AT THIS LEVEL MAY AFFECT RESULT   Albumin 2.5 (L) 3.5 - 5.0 g/dL   GFR calc non Af Amer 28 (L) >60 mL/min   GFR calc Af Amer 33 (L) >60 mL/min     Comment: (NOTE) The eGFR has been calculated using the CKD EPI equation. This calculation has not been validated in all clinical situations. eGFR's persistently <60 mL/min signify possible Chronic Kidney Disease.    Anion gap 9 5 - 15  Glucose, capillary     Status: Abnormal   Collection Time: 10/14/15  7:38 AM  Result Value Ref Range   Glucose-Capillary 122 (H) 65 - 99 mg/dL  Renal function panel     Status: Abnormal   Collection Time: 10/14/15 10:45 AM  Result Value Ref Range   Sodium 137 135 - 145 mmol/L   Potassium 4.5 3.5 - 5.1 mmol/L   Chloride 102 101 - 111 mmol/L   CO2 25 22 - 32 mmol/L   Glucose, Bld 130 (H) 65 - 99 mg/dL   BUN 81 (H) 6 - 20 mg/dL   Creatinine, Ser 2.58 (H) 0.61 - 1.24 mg/dL   Calcium 9.0 8.9 - 10.3 mg/dL   Phosphorus 3.2 2.5 - 4.6 mg/dL  Comment: ICTERUS AT THIS LEVEL MAY AFFECT RESULT   Albumin 2.5 (L) 3.5 - 5.0 g/dL   GFR calc non Af Amer 29 (L) >60 mL/min   GFR calc Af Amer 34 (L) >60 mL/min    Comment: (NOTE) The eGFR has been calculated using the CKD EPI equation. This calculation has not been validated in all clinical situations. eGFR's persistently <60 mL/min signify possible Chronic Kidney Disease.    Anion gap 10 5 - 15  Vancomycin, random     Status: None   Collection Time: 10/14/15 10:45 AM  Result Value Ref Range   Vancomycin Rm 15 ug/mL    Comment:        Random Vancomycin therapeutic range is dependent on dosage and time of specimen collection. A peak range is 20.0-40.0 ug/mL A trough range is 5.0-15.0 ug/mL          Blood gas, arterial     Status: Abnormal   Collection Time: 10/14/15 11:40 AM  Result Value Ref Range   FIO2 70.00    Delivery systems VENTILATOR    Mode PRESSURE REGULATED VOLUME CONTROL    VT 500 mL   LHR 30 resp/min   Peep/cpap 6.0 cm H20   pH, Arterial 7.23 (L) 7.350 - 7.450   pCO2 arterial 61 (H) 32.0 - 48.0 mmHg   pO2, Arterial 56 (L) 83.0 - 108.0 mmHg   Bicarbonate 25.5 21.0 - 28.0 mEq/L    Acid-base deficit 2.6 (H) 0.0 - 2.0 mmol/L   O2 Saturation 82.4 %   Patient temperature 37.0    Collection site RIGHT RADIAL    Sample type ARTERIAL DRAW    Allens test (pass/fail) PASS PASS   Mechanical Rate 30   Glucose, capillary     Status: Abnormal   Collection Time: 10/14/15  4:30 PM  Result Value Ref Range   Glucose-Capillary 110 (H) 65 - 99 mg/dL  Renal function panel     Status: Abnormal   Collection Time: 10/14/15  5:06 PM  Result Value Ref Range   Sodium 138 135 - 145 mmol/L   Potassium 4.3 3.5 - 5.1 mmol/L   Chloride 101 101 - 111 mmol/L   CO2 26 22 - 32 mmol/L   Glucose, Bld 116 (H) 65 - 99 mg/dL   BUN 75 (H) 6 - 20 mg/dL   Creatinine, Ser 2.35 (H) 0.61 - 1.24 mg/dL   Calcium 9.1 8.9 - 10.3 mg/dL   Phosphorus 3.1 2.5 - 4.6 mg/dL    Comment: ICTERUS AT THIS LEVEL MAY AFFECT RESULT   Albumin 2.6 (L) 3.5 - 5.0 g/dL   GFR calc non Af Amer 33 (L) >60 mL/min   GFR calc Af Amer 38 (L) >60 mL/min    Comment: (NOTE) The eGFR has been calculated using the CKD EPI equation. This calculation has not been validated in all clinical situations. eGFR's persistently <60 mL/min signify possible Chronic Kidney Disease.    Anion gap 11 5 - 15  Renal function panel     Status: Abnormal   Collection Time: 10/14/15 10:12 PM  Result Value Ref Range   Sodium 136 135 - 145 mmol/L   Potassium 4.2 3.5 - 5.1 mmol/L   Chloride 100 (L) 101 - 111 mmol/L   CO2 24 22 - 32 mmol/L   Glucose, Bld 120 (H) 65 - 99 mg/dL   BUN 75 (H) 6 - 20 mg/dL   Creatinine, Ser 2.16 (H) 0.61 - 1.24 mg/dL   Calcium 8.7 (L)  8.9 - 10.3 mg/dL   Phosphorus UNABLE TO REPORT DUE TO ICTERUS 2.5 - 4.6 mg/dL   Albumin 2.5 (L) 3.5 - 5.0 g/dL   GFR calc non Af Amer 36 (L) >60 mL/min   GFR calc Af Amer 42 (L) >60 mL/min    Comment: (NOTE) The eGFR has been calculated using the CKD EPI equation. This calculation has not been validated in all clinical situations. eGFR's persistently <60 mL/min signify possible Chronic  Kidney Disease.    Anion gap 12 5 - 15  Glucose, capillary     Status: Abnormal   Collection Time: 10/15/15 12:20 AM  Result Value Ref Range   Glucose-Capillary 109 (H) 65 - 99 mg/dL  Renal function panel     Status: Abnormal   Collection Time: 10/15/15  4:08 AM  Result Value Ref Range   Sodium 136 135 - 145 mmol/L   Potassium 4.2 3.5 - 5.1 mmol/L    Comment: HEMOLYSIS AT THIS LEVEL MAY AFFECT RESULT   Chloride 100 (L) 101 - 111 mmol/L   CO2 24 22 - 32 mmol/L   Glucose, Bld 120 (H) 65 - 99 mg/dL   BUN 70 (H) 6 - 20 mg/dL   Creatinine, Ser 2.06 (H) 0.61 - 1.24 mg/dL   Calcium 8.7 (L) 8.9 - 10.3 mg/dL   Phosphorus UNABLE TO REPORT DUE TO ICTERUS 2.5 - 4.6 mg/dL   Albumin 2.4 (L) 3.5 - 5.0 g/dL   GFR calc non Af Amer 38 (L) >60 mL/min   GFR calc Af Amer 44 (L) >60 mL/min    Comment: (NOTE) The eGFR has been calculated using the CKD EPI equation. This calculation has not been validated in all clinical situations. eGFR's persistently <60 mL/min signify possible Chronic Kidney Disease.    Anion gap 12 5 - 15  Magnesium     Status: None   Collection Time: 10/15/15  4:08 AM  Result Value Ref Range   Magnesium 1.9 1.7 - 2.4 mg/dL  CBC     Status: Abnormal   Collection Time: 10/15/15  4:08 AM  Result Value Ref Range   WBC 18.0 (H) 3.8 - 10.6 K/uL   RBC 3.66 (L) 4.40 - 5.90 MIL/uL   Hemoglobin 9.5 (L) 13.0 - 18.0 g/dL   HCT 31.3 (L) 40.0 - 52.0 %   MCV 85.5 80.0 - 100.0 fL   MCH 25.9 (L) 26.0 - 34.0 pg   MCHC 30.3 (L) 32.0 - 36.0 g/dL   RDW 24.4 (H) 11.5 - 14.5 %   Platelets 67 (L) 150 - 440 K/uL    Comment: PLATELET COUNT CONFIRMED BY SMEAR  Glucose, capillary     Status: Abnormal   Collection Time: 10/15/15  7:55 AM  Result Value Ref Range   Glucose-Capillary 116 (H) 65 - 99 mg/dL  Renal function panel     Status: Abnormal   Collection Time: 10/15/15 10:30 AM  Result Value Ref Range   Sodium 137 135 - 145 mmol/L   Potassium 3.9 3.5 - 5.1 mmol/L   Chloride 101 101 - 111  mmol/L   CO2 22 22 - 32 mmol/L   Glucose, Bld 130 (H) 65 - 99 mg/dL   BUN 70 (H) 6 - 20 mg/dL   Creatinine, Ser 1.99 (H) 0.61 - 1.24 mg/dL   Calcium 8.6 (L) 8.9 - 10.3 mg/dL   Phosphorus  2.5 - 4.6 mg/dL    Comment: NOT VALID   Albumin 2.3 (L) 3.5 - 5.0 g/dL  GFR calc non Af Amer 40 (L) >60 mL/min   GFR calc Af Amer 46 (L) >60 mL/min    Comment: (NOTE) The eGFR has been calculated using the CKD EPI equation. This calculation has not been validated in all clinical situations. eGFR's persistently <60 mL/min signify possible Chronic Kidney Disease.    Anion gap 14 5 - 15  Vancomycin, random     Status: None   Collection Time: 10/15/15 12:15 PM  Result Value Ref Range   Vancomycin Rm 18 ug/mL    Comment:        Random Vancomycin therapeutic range is dependent on dosage and time of specimen collection. A peak range is 20.0-40.0 ug/mL A trough range is 5.0-15.0 ug/mL          Hepatic function panel     Status: Abnormal   Collection Time: 10/15/15 12:15 PM  Result Value Ref Range   Total Protein 6.5 6.5 - 8.1 g/dL   Albumin 2.3 (L) 3.5 - 5.0 g/dL   AST 2266 (H) 15 - 41 U/L    Comment: RESULT CONFIRMED BY MANUAL DILUTION   ALT 1115 (H) 17 - 63 U/L   Alkaline Phosphatase 134 (H) 38 - 126 U/L   Total Bilirubin 16.0 (H) 0.3 - 1.2 mg/dL   Bilirubin, Direct 13.5 (H) 0.1 - 0.5 mg/dL    Comment: RESULT CONFIRMED BY MANUAL DILUTION   Indirect Bilirubin 2.5 (H) 0.3 - 0.9 mg/dL  Blood gas, arterial     Status: Abnormal   Collection Time: 10/15/15 12:30 PM  Result Value Ref Range   FIO2 0.70    Delivery systems VENTILATOR    Mode PRESSURE REGULATED VOLUME CONTROL    VT 500 mL   LHR 30 resp/min   Peep/cpap 8.0 cm H20   pH, Arterial 7.23 (L) 7.350 - 7.450   pCO2 arterial 58 (H) 32.0 - 48.0 mmHg   pO2, Arterial 78 (L) 83.0 - 108.0 mmHg   Bicarbonate 24.3 21.0 - 28.0 mEq/L   Acid-base deficit 3.7 (H) 0.0 - 2.0 mmol/L   O2 Saturation 92.7 %   Patient temperature 37.0    Collection  site RIGHT RADIAL    Sample type ARTERIAL DRAW    Allens test (pass/fail) POSITIVE (A) PASS  APTT     Status: Abnormal   Collection Time: 10/15/15  1:06 PM  Result Value Ref Range   aPTT 44 (H) 24 - 36 seconds    Comment:        IF BASELINE aPTT IS ELEVATED, SUGGEST PATIENT RISK ASSESSMENT BE USED TO DETERMINE APPROPRIATE ANTICOAGULANT THERAPY.   Protime-INR     Status: Abnormal   Collection Time: 10/15/15  1:06 PM  Result Value Ref Range   Prothrombin Time 22.4 (H) 11.4 - 15.0 seconds   INR 1.98    No components found for: ESR, C REACTIVE PROTEIN MICRO: Recent Results (from the past 720 hour(s))  Blood culture (routine x 2)     Status: None   Collection Time: 09/08/2015 11:53 PM  Result Value Ref Range Status   Specimen Description BLOOD LEFT HAND  Final   Special Requests BOTTLES DRAWN AEROBIC AND ANAEROBIC 4CC  Final   Culture NO GROWTH 5 DAYS  Final   Report Status 09/23/2015 FINAL  Final  Blood culture (routine x 2)     Status: None   Collection Time: 09/08/2015 11:53 PM  Result Value Ref Range Status   Specimen Description BLOOD LEFT ARM  Final   Special  Requests BOTTLES DRAWN AEROBIC AND ANAEROBIC 5CC  Final   Culture NO GROWTH 5 DAYS  Final   Report Status 09/23/2015 FINAL  Final  Urine culture     Status: None   Collection Time: 09/18/15 12:21 AM  Result Value Ref Range Status   Specimen Description URINE, RANDOM  Final   Special Requests NONE  Final   Culture MULTIPLE SPECIES PRESENT, SUGGEST RECOLLECTION  Final   Report Status 09/19/2015 FINAL  Final  Rapid Influenza A&B Antigens ([facility] only)     Status: None   Collection Time: 09/18/15  6:26 AM  Result Value Ref Range Status   Influenza A ([place]) NOT DETECTED  Final   Influenza B ([facility]) NOT DETECTED  Final  MRSA PCR Screening     Status: None   Collection Time: 09/20/15  6:30 AM  Result Value Ref Range Status   MRSA by PCR NEGATIVE NEGATIVE Final    Comment:        The GeneXpert MRSA Assay (FDA approved for  NASAL specimens only), is one component of a comprehensive MRSA colonization surveillance program. It is not intended to diagnose MRSA infection nor to guide or monitor treatment for MRSA infections.   Culture, respiratory (NON-Expectorated)     Status: None   Collection Time: 09/22/15  3:13 PM  Result Value Ref Range Status   Specimen Description TRACHEAL ASPIRATE  Final   Special Requests NONE  Final   Gram Stain   Final    GOOD SPECIMEN - 80-90% WBCS MODERATE WBC SEEN RARE YEAST RARE GRAM NEGATIVE COCCOBACILLI    Culture LIGHT GROWTH CANDIDA TROPICALIS  Final   Report Status 09/26/2015 FINAL  Final  Urine culture     Status: None   Collection Time: 09/22/15 11:16 PM  Result Value Ref Range Status   Specimen Description URINE, RANDOM  Final   Special Requests NONE  Final   Culture NO GROWTH 2 DAYS  Final   Report Status 09/24/2015 FINAL  Final  CULTURE, BLOOD (ROUTINE X 2) w Reflex to PCR ID Panel     Status: None   Collection Time: 09/22/15 11:44 PM  Result Value Ref Range Status   Specimen Description BLOOD RIGHT ANTECUBITAL  Final   Special Requests BOTTLES DRAWN AEROBIC AND ANAEROBIC 5ML  Final   Culture NO GROWTH 5 DAYS  Final   Report Status 09/28/2015 FINAL  Final  CULTURE, BLOOD (ROUTINE X 2) w Reflex to PCR ID Panel     Status: None   Collection Time: 09/22/15 11:56 PM  Result Value Ref Range Status   Specimen Description BLOOD LEFT HAND  Final   Special Requests BOTTLES DRAWN AEROBIC AND ANAEROBIC 5ML  Final   Culture NO GROWTH 5 DAYS  Final   Report Status 09/28/2015 FINAL  Final  Culture, respiratory (NON-Expectorated)     Status: None   Collection Time: 09/23/15 12:01 AM  Result Value Ref Range Status   Specimen Description TRACHEAL ASPIRATE  Final   Special Requests NONE  Final   Gram Stain   Final    GOOD SPECIMEN - 80-90% WBCS MODERATE WBC SEEN RARE YEAST RARE GRAM POSITIVE COCCI    Culture Consistent with normal respiratory flora.  Final    Report Status 09/25/2015 FINAL  Final  Urine culture     Status: None   Collection Time: 10/01/15 10:35 AM  Result Value Ref Range Status   Specimen Description URINE, RANDOM  Final   Special Requests NONE  Final  Culture NO GROWTH 2 DAYS  Final   Report Status 10/03/2015 FINAL  Final  CULTURE, BLOOD (ROUTINE X 2) w Reflex to PCR ID Panel     Status: None   Collection Time: 10/01/15 10:56 AM  Result Value Ref Range Status   Specimen Description BLOOD RIGHT ARM  Final   Special Requests   Final    BOTTLES DRAWN AEROBIC AND ANAEROBIC  AER 3CC ANA 5CC   Culture NO GROWTH 5 DAYS  Final   Report Status 10/06/2015 FINAL  Final  CULTURE, BLOOD (ROUTINE X 2) w Reflex to PCR ID Panel     Status: None   Collection Time: 10/01/15 11:14 AM  Result Value Ref Range Status   Specimen Description BLOOD LEFT HAND  Final   Special Requests   Final    BOTTLES DRAWN AEROBIC AND ANAEROBIC  AER 3CC ANA 4CC   Culture  Setup Time   Final    GRAM POSITIVE COCCI ANAEROBIC BOTTLE ONLY CRITICAL RESULT CALLED TO, READ BACK BY AND VERIFIED WITH: CRYSTAL Nunda AT [place] ON 10/02/15 CTJ    Culture   Final    COAGULASE NEGATIVE STAPHYLOCOCCUS ANAEROBIC BOTTLE ONLY Results consistent with contamination.    Report Status 10/06/2015 FINAL  Final  Blood Culture ID Panel (Reflexed)     Status: Abnormal   Collection Time: 10/01/15 11:14 AM  Result Value Ref Range Status   Enterococcus species NOT DETECTED NOT DETECTED Final   Listeria monocytogenes NOT DETECTED NOT DETECTED Final   Staphylococcus species DETECTED (A) NOT DETECTED Final   Staphylococcus aureus NOT DETECTED NOT DETECTED Final   Streptococcus species NOT DETECTED NOT DETECTED Final   Streptococcus agalactiae NOT DETECTED NOT DETECTED Final   Streptococcus pneumoniae NOT DETECTED NOT DETECTED Final   Streptococcus pyogenes NOT DETECTED NOT DETECTED Final   Acinetobacter baumannii NOT DETECTED NOT DETECTED Final   Enterobacteriaceae species NOT  DETECTED NOT DETECTED Final   Enterobacter cloacae complex NOT DETECTED NOT DETECTED Final   Escherichia coli NOT DETECTED NOT DETECTED Final   Klebsiella oxytoca NOT DETECTED NOT DETECTED Final   Klebsiella pneumoniae NOT DETECTED NOT DETECTED Final   Proteus species NOT DETECTED NOT DETECTED Final   Serratia marcescens NOT DETECTED NOT DETECTED Final   Haemophilus influenzae NOT DETECTED NOT DETECTED Final   Neisseria meningitidis NOT DETECTED NOT DETECTED Final   Pseudomonas aeruginosa NOT DETECTED NOT DETECTED Final   Candida albicans NOT DETECTED NOT DETECTED Final   Candida glabrata NOT DETECTED NOT DETECTED Final   Candida krusei NOT DETECTED NOT DETECTED Final   Candida parapsilosis NOT DETECTED NOT DETECTED Final   Candida tropicalis NOT DETECTED NOT DETECTED Final   Carbapenem resistance NOT DETECTED NOT DETECTED Final   Methicillin resistance DETECTED (A) NOT DETECTED Final    Comment: CRITICAL RESULT CALLED TO, READ BACK BY AND VERIFIED WITH: CRYSTAL SCARPENA FOR STAPHYLOCOCCUS SPECIES AND METHICILLIN RESISTANCE AT 0915 ON 10/02/15 CTJ    Vancomycin resistance NOT DETECTED NOT DETECTED Final  Culture, expectorated sputum-assessment     Status: None   Collection Time: 10/01/15 11:51 AM  Result Value Ref Range Status   Specimen Description EXPECTORATED SPUTUM  Final   Special Requests Normal  Final   Sputum evaluation THIS SPECIMEN IS ACCEPTABLE FOR SPUTUM CULTURE  Final   Report Status 10/01/2015 FINAL  Final  Culture, respiratory (NON-Expectorated)     Status: None   Collection Time: 10/01/15 11:51 AM  Result Value Ref Range Status   Specimen Description  EXPECTORATED SPUTUM  Final   Special Requests Normal Reflexed from [id number]  Final   Gram Stain   Final    GOOD SPECIMEN - 80-90% WBCS MODERATE WBC SEEN FEW YEAST    Culture   Final    LIGHT GROWTH CANDIDA ALBICANS LIGHT GROWTH CANDIDA DUBLINIENSIS    Report Status 10/06/2015 FINAL  Final  Culture, expectorated  sputum-assessment     Status: None   Collection Time: 10/08/15  3:30 PM  Result Value Ref Range Status   Specimen Description TRACHEAL ASPIRATE  Final   Special Requests NONE  Final   Sputum evaluation THIS SPECIMEN IS ACCEPTABLE FOR SPUTUM CULTURE  Final   Report Status 10/08/2015 FINAL  Final  Culture, respiratory (NON-Expectorated)     Status: None   Collection Time: 10/08/15  3:30 PM  Result Value Ref Range Status   Specimen Description TRACHEAL ASPIRATE  Final   Special Requests NONE Reflexed from [id number]  Final   Gram Stain   Final    FAIR SPECIMEN - 70-80% WBCS FEW WBC SEEN RARE GRAM POSITIVE COCCI RARE GRAM NEGATIVE RODS    Culture Consistent with normal respiratory flora.  Final   Report Status 10/11/2015 FINAL  Final  Culture, bal-quantitative     Status: None (Preliminary result)   Collection Time: 10/12/15 12:16 PM  Result Value Ref Range Status   Specimen Description BRONCHIAL ALVEOLAR LAVAGE  Final   Special Requests Immunocompromised  Final   Gram Stain   Final    FEW WBC SEEN FEW GRAM POSITIVE COCCI FAIR SPECIMEN - 70-80% WBCS    Culture HOLDING FOR POSSIBLE PATHOGEN  Final   Report Status PENDING  Incomplete  CULTURE, BLOOD (ROUTINE X 2) w Reflex to PCR ID Panel     Status: None (Preliminary result)   Collection Time: 10/12/15  3:59 PM  Result Value Ref Range Status   Specimen Description BLOOD RIGHT ARM  Final   Special Requests   Final    BOTTLES DRAWN AEROBIC AND ANAEROBIC [place] [facility] [place] [facility]   Culture NO GROWTH 3 DAYS  Final   Report Status PENDING  Incomplete  CULTURE, BLOOD (ROUTINE X 2) w Reflex to PCR ID Panel     Status: None (Preliminary result)   Collection Time: 10/12/15  4:07 PM  Result Value Ref Range Status   Specimen Description BLOOD LEFT HAND  Final   Special Requests   Final    BOTTLES DRAWN AEROBIC AND ANAEROBIC [place] [facility] [place] [facility]   Culture  Setup Time   Final    GRAM POSITIVE COCCI AEROBIC BOTTLE ONLY CRITICAL RESULT CALLED TO, READ BACK  BY AND VERIFIED WITH: MATT MCBANE AT 2800 10/14/15 DV    Culture   Final    COAGULASE NEGATIVE STAPHYLOCOCCUS AEROBIC BOTTLE ONLY Results consistent with contamination.    Report Status PENDING  Incomplete  Blood Culture ID Panel (Reflexed)     Status: Abnormal   Collection Time: 10/12/15  4:07 PM  Result Value Ref Range Status   Enterococcus species NOT DETECTED NOT DETECTED Final   Listeria monocytogenes NOT DETECTED NOT DETECTED Final   Staphylococcus species DETECTED (A) NOT DETECTED Final    Comment: CRITICAL RESULT CALLED TO, READ BACK BY AND VERIFIED WITH: MATT MCBANE AT 3491 10/14/15 DV    Staphylococcus aureus NOT DETECTED NOT DETECTED Final   Streptococcus species NOT DETECTED NOT DETECTED Final   Streptococcus agalactiae NOT DETECTED NOT DETECTED Final   Streptococcus pneumoniae NOT DETECTED NOT DETECTED  Final   Streptococcus pyogenes NOT DETECTED NOT DETECTED Final   Acinetobacter baumannii NOT DETECTED NOT DETECTED Final   Enterobacteriaceae species NOT DETECTED NOT DETECTED Final   Enterobacter cloacae complex NOT DETECTED NOT DETECTED Final   Escherichia coli NOT DETECTED NOT DETECTED Final   Klebsiella oxytoca NOT DETECTED NOT DETECTED Final   Klebsiella pneumoniae NOT DETECTED NOT DETECTED Final   Proteus species NOT DETECTED NOT DETECTED Final   Serratia marcescens NOT DETECTED NOT DETECTED Final   Haemophilus influenzae NOT DETECTED NOT DETECTED Final   Neisseria meningitidis NOT DETECTED NOT DETECTED Final   Pseudomonas aeruginosa NOT DETECTED NOT DETECTED Final   Candida albicans NOT DETECTED NOT DETECTED Final   Candida glabrata NOT DETECTED NOT DETECTED Final   Candida krusei NOT DETECTED NOT DETECTED Final   Candida parapsilosis NOT DETECTED NOT DETECTED Final   Candida tropicalis NOT DETECTED NOT DETECTED Final   Carbapenem resistance NOT DETECTED NOT DETECTED Final   Methicillin resistance DETECTED (A) NOT DETECTED Final    Comment: CRITICAL RESULT  CALLED TO, READ BACK BY AND VERIFIED WITH: MATT MCBANE AT 4765 10/14/15 DV    Vancomycin resistance NOT DETECTED NOT DETECTED Final    IMAGING: Dg Chest 1 View  10/09/2015  CLINICAL DATA:  Dyspnea. EXAM: CHEST 1 VIEW COMPARISON:  10/06/2015 FINDINGS: Study is degraded by motion artifact. Tracheostomy tube, right IJ central venous catheter sheath and left IJ central venous catheter are stable. Enteric catheter is poorly visualized. The cardiac silhouette is enlarged. Mediastinal contours appear intact. There is no evidence of focal airspace consolidation, pleural effusion or pneumothorax. There is low lung volume with bibasilar streaky opacities, likely representing atelectasis, left worse than right. There is mild pulmonary vascular congestion. Osseous structures are without acute abnormality. Soft tissues are grossly normal. IMPRESSION: Low lung volumes with bibasilar subsegmental atelectasis, left worse than right. Enlarged cardiac silhouette. Persistent mild pulmonary vascular congestion. Electronically Signed   By: Fidela Salisbury M.D.   On: 10/09/2015 09:56   Dg Chest 1 View  10/05/2015  CLINICAL DATA:  Dyspnea. EXAM: CHEST 1 VIEW COMPARISON:  October 04, 2015. FINDINGS: Tracheostomy is in grossly good position. Hypoinflation of the lungs is noted. Left internal jugular catheter is noted with distal tip in expected position of SVC. Stable cardiomegaly. No pneumothorax or significant pleural effusion is noted. Slightly increased right upper lobe opacity is noted concerning for atelectasis or possibly pneumonia. Stable left upper lobe opacity is noted. Bony thorax is unremarkable. IMPRESSION: Right upper lobe opacity is noted which is slightly increased suggesting worsening pneumonia or atelectasis. Stable left upper lobe opacity is noted. Hypoinflation of the lungs is noted. Electronically Signed   By: Marijo Conception, M.D.   On: 10/05/2015 07:50   Dg Chest 1 View  09/27/2015  CLINICAL DATA:   Shortness of breath. EXAM: CHEST 1 VIEW COMPARISON:  09/26/2015. FINDINGS: Endotracheal tube, left IJ line, NG tube in stable position . The cardiomegaly with pulmonary vascular prominence and bilateral pulmonary infiltrates consistent with pulmonary edema, no significant interim change. Right mid lung and basilar atelectasis. No pleural effusion or pneumothorax. IMPRESSION: 1. Lines and tubes in stable position. 2. Cardiomegaly with pulmonary vascular prominence and persistent bilateral pulmonary infiltrates consistent pulmonary edema. No significant interim change. 3. Lung volumes with persistent basilar atelectasis . Electronically Signed   By: Marcello Moores  Register   On: 09/27/2015 08:02   Dg Chest 1 View  09/26/2015  CLINICAL DATA:  Dyspnea, morbid obesity. EXAM: CHEST 1 VIEW  COMPARISON:  Chest x-rays dated 09/25/2015 and 09/24/2015. FINDINGS: Endotracheal tube appears well positioned with tip just above the level of the carina. Enteric tube passes below the diaphragm. Left IJ central line remains adequately positioned with tip projected over the upper SVC. Exam otherwise limited, as previously described. There is grossly stable cardiomegaly. Overall cardiomediastinal silhouette appears stable in size and configuration. Central pulmonary vascular congestion and bilateral interstitial edema persists. Left lung base difficult to evaluate, suspect atelectasis and/or effusion at the left lung base. IMPRESSION: 1. Stable chest x-ray appearance. Cardiomegaly with central pulmonary vascular congestion and bilateral interstitial edema, not significantly changed, suggesting continued mild volume overload/CHF. 2. Left lung base difficult to evaluate. Suspect atelectasis and/or small effusion at the left lung base. If febrile, left lower lobe pneumonia could not be excluded. 3.  Tubes and lines remain adequately positioned. Electronically Signed   By: Franki Cabot M.D.   On: 09/26/2015 09:03   Dg Ankle 2 Views  Right  09/27/2015  CLINICAL DATA:  Injury.  Possible fracture. EXAM: RIGHT ANKLE - 2 VIEW COMPARISON:  None. FINDINGS: The mineralization and alignment are normal. There is no evidence of acute fracture or dislocation. The joint spaces appear adequately maintained. There are extensive soft tissue calcifications within the lower leg, suggesting underlying connective tissue disorder or venous stasis. IMPRESSION: No acute osseous findings. Electronically Signed   By: Richardean Sale M.D.   On: 09/27/2015 17:30   Dg Abd 1 View  10/05/2015  CLINICAL DATA:  NG tube placement. EXAM: ABDOMEN - 1 VIEW COMPARISON:  09/25/2015 FINDINGS: Severely limited study by body habitus. Very difficult to visualize the NG tube. There appears to be a tube coiling within the stomach. IMPRESSION: Very limited study. There appears to be in NG tube coiling in the stomach. Electronically Signed   By: Rolm Baptise M.D.   On: 10/05/2015 20:59   Dg Abd 1 View  09/25/2015  CLINICAL DATA:  Check gastric catheter placement EXAM: ABDOMEN - 1 VIEW COMPARISON:  09/20/2015 FINDINGS: Gastric catheter extends into the mid stomach. No other diagnostic information can be gleaned from this examination. IMPRESSION: Gastric catheter in mid stomach Electronically Signed   By: Inez Catalina M.D.   On: 09/25/2015 18:55   Dg Chest Port 1 View  10/14/2015  CLINICAL DATA:  Respiratory failure. EXAM: PORTABLE CHEST 1 VIEW COMPARISON:  October 11, 2015 FINDINGS: There is a There is a new line entering via a left subclavian approach terminating in the SVC. A the right central line is stable. No pneumothorax. Increased interstitial markings in the lungs consistent with edema. The left retrocardiac region is not well assessed but there is obscuration of the left hemidiaphragm, stable. No change in the cardiomediastinal silhouette. IMPRESSION: There is a new left-sided line in good position. No pneumothorax. Continued of pulmonary edema. Electronically Signed   By:  Dorise Bullion III M.D   On: 10/14/2015 07:24   Dg Chest Port 1 View  10/11/2015  CLINICAL DATA:  Respiratory failure. EXAM: PORTABLE CHEST 1 VIEW COMPARISON:  10/09/2015 FINDINGS: Study limited by body habitus and portable nature of the study. Tracheostomy and right central line are unchanged. Cardiomegaly with bilateral airspace opacities, worsening since prior study, likely worsening edema. Probable superimposed bibasilar atelectasis. Cannot exclude small effusions. IMPRESSION: Worsening bilateral airspace disease, likely a combination of worsening edema/ CHF and bibasilar atelectasis. Possible small layering effusions. Electronically Signed   By: Rolm Baptise M.D.   On: 10/11/2015 07:26   Dg Chest  Port 1 View  10/06/2015  CLINICAL DATA:  Central line placement EXAM: PORTABLE CHEST 1 VIEW COMPARISON:  10/06/2015 FINDINGS: Cardiomegaly again noted. Persistent central vascular congestion and mild interstitial edema bilaterally. Tracheostomy tube is unchanged in position. Stable left IJ central line position. There is new right IJ central line with tip in SVC right atrium junction. No pneumothorax. IMPRESSION: Persistent mild congestion/ edema. Cardiomegaly again noted. Stable left IJ central line position. There is new right IJ central line with tip in SVC right atrium junction. No pneumothorax. Electronically Signed   By: Lahoma Crocker M.D.   On: 10/06/2015 19:08   Dg Chest Port 1 View  10/06/2015  CLINICAL DATA:  CHF.  Pneumonia. EXAM: PORTABLE CHEST 1 VIEW COMPARISON:  10/05/2015 FINDINGS: Tracheostomy in good position. NG tube not well visualized due to underpenetration. Cardiac enlargement. Congestive heart failure with bilateral edema has progressed in the interval. Progression of bibasilar atelectasis with hypoventilation noted. IMPRESSION: Progression of congestive heart failure with edema. Progression of bibasilar atelectasis. Electronically Signed   By: Franchot Gallo M.D.   On: 10/06/2015 08:07    Dg Chest Port 1 View  10/01/2015  CLINICAL DATA:  Pt had trach placed this am; pt has been in hospital since December for multiple problems; morbidly obese, smoker, afib EXAM: PORTABLE CHEST 1 VIEW COMPARISON:  10/03/2015 FINDINGS: Tracheostomy tube projects over the tracheal air column with tip 4.5 cm above the carina. Left internal jugular central line tip projects over the superior vena cava. Study limited by body habitus. Severe cardiac enlargement. Vascular congestion, moderate. Right lung otherwise clear. Patchy left upper lobe opacity consistent with infiltrate. IMPRESSION: Lines and tubes as described with severe cardiac enlargement. Vascular congestion persists. Focal left upper lobe airspace disease likely represents residual clearing pulmonary edema. Pneumonia not excluded and follow-up recommended. Electronically Signed   By: Skipper Cliche M.D.   On: 09/30/2015 18:43   Dg Chest Port 1 View  10/03/2015  CLINICAL DATA:  Respiratory failure, smoker, chronic systolic CHF, atrial fibrillation EXAM: PORTABLE CHEST 1 VIEW COMPARISON:  Portable exam 0546 hours compared to 10/02/2015 FINDINGS: Tip of endotracheal tube projects 3.5 cm above carina. Nasogastric tube extends to at least the mid chest, distal and stent obscured by light technique. LEFT jugular central venous catheter tip projects over SVC. Enlargement of cardiac silhouette with pulmonary vascular congestion. Motion artifacts degrade assessment of lung bases. Questionable perihilar infiltrate versus edema. Bibasilar atelectasis. No gross pneumothorax. IMPRESSION: Enlargement of cardiac silhouette with pulmonary vascular congestion. Bibasilar atelectasis with question mild pulmonary edema. Electronically Signed   By: Lavonia Dana M.D.   On: 10/03/2015 09:38   Dg Chest Port 1 View  10/02/2015  CLINICAL DATA:  Hypoxia this morning. Check endotracheal tube position. EXAM: PORTABLE CHEST 1 VIEW COMPARISON:  09/27/2015 FINDINGS: Patient is  moderately rotated to the left. Endotracheal tube approximately 3.2 cm above the carina. Left IJ central venous catheter unchanged. Nasogastric tube courses into the region of the stomach and off the inferior portion of the film as tip is not visualized. Lungs are hypoinflated with persistent prominence of the perihilar markings suggesting mild interstitial edema unchanged. Persistent left base opacification likely effusion with atelectasis although cannot exclude infection. Stable cardiomegaly. Remainder the exam is unchanged. IMPRESSION: Hypoinflation with stable left base opacification likely effusions/atelectasis although cannot exclude infection. Stable cardiomegaly and mild interstitial edema unchanged. Tubes and lines unchanged. Electronically Signed   By: Marin Olp M.D.   On: 10/02/2015 10:20   Dg Chest  Port 1 View  09/25/2015  CLINICAL DATA:  Difficulty with intubation. Morbid obesity. History of respiratory failure. EXAM: PORTABLE CHEST 1 VIEW COMPARISON:  Chest x-rays dated 09/24/2015 and 09/23/2015. FINDINGS: Endotracheal tube appears well positioned with tip approximately 2 cm above the carina. Left IJ central line remains adequately positioned with tip at the upper margin of the SVC. Enteric tube appears to pass below the diaphragm but its inferior portion is not clearly seen. Remainder of the exam is limited. There is grossly stable cardiomegaly. There is at least mild central pulmonary vascular congestion and bilateral interstitial edema, not significantly changed. IMPRESSION: Stable chest x-ray. Endotracheal tube appears well positioned with tip just above the level of the carina. Cardiomegaly with central pulmonary vascular congestion and bilateral interstitial edema, unchanged, suggesting continued mild volume overload/CHF. Electronically Signed   By: Franki Cabot M.D.   On: 09/25/2015 16:18   Dg Chest Port 1 View  09/24/2015  CLINICAL DATA:  Respiratory failure. EXAM: PORTABLE CHEST 1  VIEW COMPARISON:  Chest x-rays from 09/15/2015 to 09/23/2015 FINDINGS: Endotracheal tube, central line, and OG tube appear unchanged. Persistent increased density at both lung bases, most likely pleural effusions and atelectasis. Fluid along the fissures in the right hemi thorax. New linear area of atelectasis in the right lung apex. Cardiomegaly with enlargement of the main pulmonary artery. IMPRESSION: New linear atelectasis in the right lung apex. Persistent bibasilar atelectasis and effusions. Electronically Signed   By: Lorriane Shire M.D.   On: 09/24/2015 07:53   Dg Chest Port 1 View  09/23/2015  CLINICAL DATA:  Respiratory failure, pneumonia, CHF, acute encephalopathy, current smoker. EXAM: PORTABLE CHEST 1 VIEW COMPARISON:  Portable chest x-ray of September 21, 2014 FINDINGS: The lungs remain mildly hypoinflated. The cardiac silhouette remains enlarged. The pulmonary vascularity remains engorged. The retrocardiac region remains dense and the left hemidiaphragm is nearly totally obscured. Hazy alveolar opacity in the right lung persists. The endotracheal tube tip lies 6 cm above the carina. The esophagogastric tube tip projects below the inferior margin of the image. The left internal jugular venous catheter tip projects over the proximal SVC. IMPRESSION: CHF with pulmonary interstitial and mild alveolar edema. Persistent left lower lobe atelectasis or pneumonia. There has not been significant interval change since the previous study. The support tubes are in reasonable position. Electronically Signed   By: Ladarian Bonczek  [place] M.D.   On: 09/23/2015 07:39   Dg Chest Port 1 View  09/22/2015  CLINICAL DATA:  Respiratory failure, history of CHF, acute encephalopathy, current smoker. EXAM: PORTABLE CHEST 1 VIEW COMPARISON:  Portable chest x-ray of September 20, 2015 FINDINGS: The lungs are slightly better inflated today. This may be at least in part due to differences in patient positioning. The pulmonary interstitial  markings remain increased especially on the right. The retrocardiac region on the left remains dense. The cardiac silhouette remains enlarged. The pulmonary vascularity remains engorged. The endotracheal tube tip lies 4.4 cm above the carina. The left internal jugular venous catheter tip projects over the proximal portion of the SVC. The esophagogastric tube tip projects below the inferior margin of the image. IMPRESSION: CHF with pulmonary interstitial edema. Left lower lobe atelectasis or pneumonia. Overall there has not been significant interval change since the previous study. The support tubes are in reasonable position. Electronically Signed   By: Mikeya Tomasetti  [place] M.D.   On: 09/22/2015 07:54   Dg Chest Port 1 View  09/20/2015  CLINICAL DATA:  40 year old male with endotracheal tube, NG  tube and central line placement. Shortness of breath. EXAM: PORTABLE CHEST 1 VIEW COMPARISON:  08/21/2015 FINDINGS: An endotracheal tube with tip 2.7 cm above the carina, left IJ central venous catheter extending over the mediastinum but tip very difficult to identify, and NG tube entering the stomach with tip off the field of view. Cardiomegaly, bilateral airspace disease/atelectasis and pulmonary vascular congestion again noted. There is no evidence of pneumothorax. IMPRESSION: Support apparatus as described with endotracheal tube tip 2.7 cm above the carina and NG tube entering the stomach. Left IJ central venous catheter tip very difficult to identify. No evidence of pneumothorax. Cardiomegaly, bilateral airspace disease/ atelectasis in pulmonary vascular congestion again noted. Electronically Signed   By: Margarette Canada M.D.   On: 09/20/2015 07:16   Dg Chest Portable 1 View  09/01/2015  CLINICAL DATA:  40 year old male with swelling in the legs and under the arms. EXAM: PORTABLE CHEST 1 VIEW COMPARISON:  No priors. FINDINGS: Lung volumes are low. Ill-defined opacities throughout the right mid to lower lung, concerning for  probable multilobar bronchopneumonia. Left lung appears clear, although assessment is severely limited by the patient's large body habitus. No definite pleural effusions. Heart size is moderately enlarged. Cephalization of the pulmonary vasculature. Upper mediastinal contours are within normal limits allowing for the lordotic positioning and patient's slight rotation to the left. IMPRESSION: 1. Findings concerning for multilobar bronchopneumonia involving the right middle and lower lobes. Followup PA and lateral chest X-ray is recommended in 3-4 weeks following trial of antibiotic therapy to ensure resolution and exclude underlying malignancy. 2. Moderate cardiomegaly with pulmonary venous congestion, but no frank pulmonary edema. Electronically Signed   By: Vinnie Langton M.D.   On: 09/18/2015 22:45   Dg Abd Portable 1v  09/20/2015  CLINICAL DATA:  40 year old male with NG tube placement. Subsequent encounter. EXAM: PORTABLE ABDOMEN - 1 VIEW COMPARISON:  09/20/2015 chest x-ray. FINDINGS: Exam limited by patient's habitus. Nasogastric tube can only be seen to the level of the gastroesophageal junction. Further evaluation limited because of habitus and motion. IMPRESSION: Nasogastric tube can only be seen to the level of the gastroesophageal junction. Further evaluation limited because of habitus and motion. Electronically Signed   By: Genia Del M.D.   On: 09/20/2015 09:22   Dg Foot 2 Views Right  09/27/2015  CLINICAL DATA:  Pain following trauma EXAM: RIGHT FOOT - 2 VIEW COMPARISON:  None. FINDINGS: Frontal and lateral views were obtained. There is soft tissue edema. There is no demonstrable fracture or dislocation. Joint spaces appear intact. No erosive change. IMPRESSION: Generalized soft tissue swelling. No fracture or dislocation. No appreciable arthropathy. Electronically Signed   By: Lowella Grip III M.D.   On: 09/27/2015 17:28   US Abdomen Limited Ruq  09/18/2015  CLINICAL DATA:   Hyperbilirubinemia.  Morbid obesity. EXAM: US ABDOMEN LIMITED - RIGHT UPPER QUADRANT COMPARISON:  None. FINDINGS: Gallbladder: Gallbladder wall is thickened, 3.6 mm. No sonographic Murphy sign. No gallbladder %%%%%% a no gallstones identified. No pericholecystic fluid. Common bile duct: Diameter: 5.0 mm Liver: Liver contour is nodular. No focal liver lesions are identified. Liver echotexture is mildly heterogeneous. Additional: Right small right pleural effusion. Study quality is degraded by patient body habitus and limited patient mobility. IMPRESSION: 1. Nodular contour the liver raising the question of cirrhosis. 2. Gallbladder wall thickening, nonspecific in appears. 3. Small right pleural effusion. Electronically Signed   By: Nolon Nations M.D.   On: 09/18/2015 10:14    Assessment:   Taino  Bolger is a 40 y.o. 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  He is too large for imaging other than CXR.  Multiple sources of recurrent fevers/leukocytosis include :sinusitis from NGT, VAP (CX pending), line infection, skin infection.  Recommendations Cont vanco and meropenem pending culture from sputum If worsens would add eraxis for antifungal coverage Agree with changing Central line Thank you very much for allowing me to participate in the care of this patient. Please call with questions.   Cheral Marker. Ola Spurr, MD

## 2015-10-15 NOTE — Progress Notes (Addendum)
Per Dr. Wynelle Link- keep CRRT machine at current rate of 2.35ml therapy fluid, 50ml ultrafiltration, and blood flow rate.

## 2015-10-15 NOTE — Progress Notes (Signed)
PULMONARY/CCM PROGRESS NOTE  40 year old massively obese male with acute CHF, volume overload. Now with acute renal failure, vent dependent respiratory failure status post trach, depress mentation since tracheostomy.     Lines, Tubes, etc: ETT 01/02 >> . Patient self extubated, subsequently reintubated 09/25/2015 L IJ CVL 01/02 >>   Microbiology: Blood 12/30 >>  MRSA PCR 01/02 >> NEG Urine 01/04 >> NEG Blood 01/04 >> no growth BAL 1/24>>normal respiratory flora Cefepime 1/24>> Vanc 1/24>>  PCT 01/04: 0.74  Antibiotics:  Levofloxacin 12/30 >> 01/01 Ceftriaxone 01/02 >> 01/02 Azithro 01/02 >> 01/02 Levofloxacin 01/04 >> 01/05 Vanc 01/05 >> 1/9 Ceftaz 01/05 >>1/9  Cefepime 1/25>>1/26 Vanc 1/26>> Meropenem 126>>   PCT 01/04: 0.74, 0.95, 0.77  Studies/Events: 12/31 TTE:  poor quality study, LVEF 45% 12/31 RUQ: Nodular contour the liver raising the question of cirrhosis. Gallbladder wall thickening, nonspecific in appearance 01/04 night - fever, hypotension. Cultures obtained. Abx expanded -failed self extubation 1/24 Bronchoscopy - thick purulent secretions, mainly in the upper airways, mucosal edema and erythema noted, BAL on RML.   Best Practice: DVT: enoxaparin SUP: enteral famotidine Nutrition: TFs    Subj: Patient continues to have fevers, only on PRN sedation and agitation meds, still no purposeful movements Obj: Filed Vitals:   10/15/15 0900 10/15/15 1000 10/15/15 1100 10/15/15 1200  BP: 86/54  Pulse: 95 95 95 97  Temp: 98.8 F (37.1 C) 98.8 F (37.1 C) 98.8 F (37.1 C)   TempSrc:      Resp: 31 32 32 31  Height:      Weight:      SpO2: 97% 97% 96% 96%    Gen: morbidly obese HEENT:  WNL.  Mild blood tinged secretions noted.  Neck: CVL site clean, JVP cannot be assessed Chest: shallow and clear Cardiac: distant HS, regular, no M noted Abd: obese, soft, + BS Ext: symmetric edema, chronic stasis changes Neuro:  GCS<10T  BMET    Component Value Date/Time   NA 137 10/15/2015 1030   K 3.9 10/15/2015 1030   CL 101 10/15/2015 1030   CO2 22 10/15/2015 1030   GLUCOSE 130* 10/15/2015 1030   BUN 70* 10/15/2015 1030   CREATININE 1.99* 10/15/2015 1030   CALCIUM 8.6* 10/15/2015 1030   GFRNONAA 40* 10/15/2015 1030   GFRAA 46* 10/15/2015 1030    CBC    Component Value Date/Time   WBC 18.0* 10/15/2015 0408   RBC 3.66* 10/15/2015 0408   HGB 9.5* 10/15/2015 0408   HCT 31.3* 10/15/2015 0408   PLT 67* 10/15/2015 0408   MCV 85.5 10/15/2015 0408   MCH 25.9* 10/15/2015 0408   MCHC 30.3* 10/15/2015 0408   RDW 24.4* 10/15/2015 0408   LYMPHSABS 0.8* 10/07/2015 1116   MONOABS 1.4* 10/07/2015 1116   EOSABS 0.5 10/07/2015 1116   BASOSABS 0.1 10/07/2015 1116      IMPRESSION: -- Acute on chronic hypercarbic respiratory failure , pulmonary edema.  S/P trach tube placement 09/20/2015 -- Obesity hypoventilation syndrome -- Atelectasis -- Cardiomyopathy - LVEF 45% 09/18/15, repeat ECHO 1/24 EF 50% mod RA Dil and elevated RVSP -- Shock - suspect septic -- AKI, oliguric. CRRT initiated 1/18.  R IJ temporary HD catheter 10/06/15 -- Severe hypervolemia  -- Sepsis -- ICU acquired anemia -- New thrombocytopenia - SQ heparin DC'd 01/22 -- ICU associated delirium --Tracheitis -- neurologic depression - since tracheostomy  PLAN/RECS:  Cont full vent support - settings reviewed and/or adjusted -switched to [facility] on 1/24 due to vent  dyssynchrony.  Cont vent bundle Daily SBT if/when meets criteria Repeat ECHO performed 1/24, see above,elevated RVSP, dil RV and RA, ? PE, hep stopped due to thrombocytopenia, started on argatroban 1/27 for DVT prophylaxis  Wean norepinephrine to off for MAP > 65 mmHg Monitor BMET intermittently Monitor I/Os Correct electrolytes as indicated CRRT per Renal, stopped on 1/24, did not tolerated HD well, restarted CRRT with new cartridges on 1/25. Monitor Plts closely, now  improving.  DVT px: SCDs Monitor CBC intermittently  Keep eye on plt counts Transfuse per usual ICU guidelines PRN fentanyl and Versed, low dose Xanax Cont SSRI If fevers develop again, add antifungals per ID consult.  Consider changing CVL   Seen by neurology, EEG with good Brain activity.   CCM time: 35 mins I have reviewed images and labs, incorporated those finding to medical decision making.  The above time includes time spent in consultation with patient and/or family members and reviewing care plan on multidisciplinary rounds  Stephanie Acre, MD [facility] Pager [phone] (please enter 7-digits) On Call Pager - [phone] (please enter 7-digits)

## 2015-10-15 NOTE — Progress Notes (Signed)
Nutrition Follow-up     INTERVENTION:   EN: continue current TF regimen as tolerated   NUTRITION DIAGNOSIS:   Inadequate oral intake related to inability to eat as evidenced by NPO status.  GOAL:   Patient will meet greater than or equal to 90% of their needs  MONITOR:    (Energy Intake, Digestive System, Pulmonary Profile, Electrolyte and Renal Profile, Anthropometrics)  REASON FOR ASSESSMENT:   Ventilator, Consult Enteral/tube feeding initiation and management  ASSESSMENT:   Pt remains on vent via trach, on levophed 15 mcg/min, CRRT with UF 50 ml/hr  Diet Order:   NPO  EN: tolerating Vital High Protein at rate of 60 ml/hr, Prostat  Skin:  Reviewed, no issues; skin tears but no pressure ulcers  Last BM:  1/23   Electrolyte and Renal Profile:  Recent Labs Lab 10/11/15 2006  10/14/15 0400  10/14/15 1706 10/14/15 2212 10/15/15 0408 10/15/15 1030  BUN 55*  < > 79*  < > 75* 75* 70* 70*  CREATININE 2.09*  < > 2.66*  < > 2.35* 2.16* 2.06* 1.99*  NA 139  < > 138  < > 138 136 136 137  K 4.2  < > 4.6  < > 4.3 4.2 4.2 3.9  MG 2.0  --  2.0  --   --   --  1.9  --   PHOS  --   < > 3.2  < > 3.1 UNABLE TO REPORT DUE TO ICTERUS UNABLE TO REPORT DUE TO ICTERUS  --   < > = values in this interval not displayed. Glucose Profile:  Recent Labs  10/14/15 1630 10/15/15 0020 10/15/15 0755  GLUCAP 110* 109* 116*   Meds: lactulose added today, senokot, levophed  Height:   Ht Readings from Last 1 Encounters:  09/20/15  (1.753 m)    Weight:   Wt Readings from Last 1 Encounters:  10/15/15 618 lb 2.7 oz (280.4 kg)    Filed Weights   10/13/15 0500 10/14/15 0500 10/15/15 0500  Weight: 641 lb 8.6 oz (291 kg) 646 lb 13.3 oz (293.4 kg) 618 lb 2.7 oz (280.4 kg)    BMI:  Body mass index is 91.25 kg/(m^2).  Estimated Nutritional Needs:   Kcal:  1600-1817kcals (22-25kcals/kg) using  IBW of 72.7kg  Protein:  145-181g protein (2.0-2.5g/kg) using IBW of  72.7kg  Fluid:  2181-2569mL of fluid (30-7mL/kg)  EDUCATION NEEDS:   No education needs identified at this time  HIGH Care Level  Romelle Starcher MS, RD, LDN [phone] Pager  [phone] Weekend/On-Call Pager

## 2015-10-16 ENCOUNTER — Inpatient Hospital Stay: Payer: Medicaid Other

## 2015-10-16 LAB — RENAL FUNCTION PANEL
ALBUMIN: 2.2 g/dL — AB (ref 3.5–5.0)
ALBUMIN: 2.2 g/dL — AB (ref 3.5–5.0)
ANION GAP: 12 (ref 5–15)
ANION GAP: 9 (ref 5–15)
Albumin: 2.1 g/dL — ABNORMAL LOW (ref 3.5–5.0)
Anion gap: 11 (ref 5–15)
BUN: 63 mg/dL — ABNORMAL HIGH (ref 6–20)
BUN: 62 mg/dL — ABNORMAL HIGH (ref 6–20)
BUN: 63 mg/dL — AB (ref 6–20)
CALCIUM: 8.8 mg/dL — AB (ref 8.9–10.3)
CHLORIDE: 101 mmol/L (ref 101–111)
CO2: 23 mmol/L (ref 22–32)
CO2: 25 mmol/L (ref 22–32)
CO2: 24 mmol/L (ref 22–32)
CREATININE: 1.31 mg/dL — AB (ref 0.61–1.24)
Calcium: 8.5 mg/dL — ABNORMAL LOW (ref 8.9–10.3)
Calcium: 8.6 mg/dL — ABNORMAL LOW (ref 8.9–10.3)
Chloride: 102 mmol/L (ref 101–111)
Chloride: 101 mmol/L (ref 101–111)
Creatinine, Ser: 1.53 mg/dL — ABNORMAL HIGH (ref 0.61–1.24)
Creatinine, Ser: 1.47 mg/dL — ABNORMAL HIGH (ref 0.61–1.24)
GFR calc non Af Amer: 55 mL/min — ABNORMAL LOW (ref >60)
GFR calc non Af Amer: 58 mL/min — ABNORMAL LOW (ref >60)
GLUCOSE: 159 mg/dL — AB (ref 65–99)
Glucose, Bld: 121 mg/dL — ABNORMAL HIGH (ref 65–99)
Glucose, Bld: 108 mg/dL — ABNORMAL HIGH (ref 65–99)
PHOSPHORUS: UNDETERMINED mg/dL (ref 2.5–4.6)
PHOSPHORUS: UNDETERMINED mg/dL (ref 2.5–4.6)
POTASSIUM: 3.7 mmol/L (ref 3.5–5.1)
Phosphorus: UNDETERMINED mg/dL (ref 2.5–4.6)
Potassium: 3.8 mmol/L (ref 3.5–5.1)
Potassium: 4 mmol/L (ref 3.5–5.1)
SODIUM: 137 mmol/L (ref 135–145)
Sodium: 135 mmol/L (ref 135–145)
Sodium: 136 mmol/L (ref 135–145)

## 2015-10-16 LAB — CBC
HCT: 32.7 % — ABNORMAL LOW (ref 40.0–52.0)
HEMOGLOBIN: 10 g/dL — AB (ref 13.0–18.0)
MCH: 25.8 pg — ABNORMAL LOW (ref 26.0–34.0)
MCHC: 30.6 g/dL — AB (ref 32.0–36.0)
MCV: 84.5 fL (ref 80.0–100.0)
Platelets: 82 10*3/uL — ABNORMAL LOW (ref 150–440)
RBC: 3.88 MIL/uL — ABNORMAL LOW (ref 4.40–5.90)
RDW: 24.1 % — ABNORMAL HIGH (ref 11.5–14.5)
WBC: 21.4 10*3/uL — AB (ref 3.8–10.6)

## 2015-10-16 LAB — MAGNESIUM: MAGNESIUM: 1.9 mg/dL (ref 1.7–2.4)

## 2015-10-16 LAB — APTT
APTT: 137 s — AB (ref 24–36)
APTT: 144 s — AB (ref 24–36)
APTT: 128 s — AB (ref 24–36)
aPTT: 156 seconds — ABNORMAL HIGH (ref 24–36)
aPTT: 114 seconds — ABNORMAL HIGH (ref 24–36)
aPTT: 143 seconds — ABNORMAL HIGH (ref 24–36)

## 2015-10-16 LAB — GLUCOSE, CAPILLARY
GLUCOSE-CAPILLARY: 107 mg/dL — AB (ref 65–99)
GLUCOSE-CAPILLARY: 113 mg/dL — AB (ref 65–99)
GLUCOSE-CAPILLARY: 119 mg/dL — AB (ref 65–99)
Glucose-Capillary: 107 mg/dL — ABNORMAL HIGH (ref 65–99)
Glucose-Capillary: 90 mg/dL (ref 65–99)

## 2015-10-16 LAB — BLOOD GAS, ARTERIAL
ACID-BASE DEFICIT: 2.7 mmol/L — AB (ref 0.0–2.0)
Allens test (pass/fail): POSITIVE — AB
BICARBONATE: 25 meq/L (ref 21.0–28.0)
FIO2: 70
MECHVT: 550 mL
O2 Saturation: 93.1 %
PATIENT TEMPERATURE: 37
PEEP/CPAP: 8 cmH2O
PH ART: 7.25 — AB (ref 7.350–7.450)
PO2 ART: 78 mmHg — AB (ref 83.0–108.0)
RATE: 30 resp/min
pCO2 arterial: 57 mmHg — ABNORMAL HIGH (ref 32.0–48.0)

## 2015-10-16 LAB — COMPREHENSIVE METABOLIC PANEL
ALBUMIN: 2.3 g/dL — AB (ref 3.5–5.0)
ALK PHOS: 164 U/L — AB (ref 38–126)
ALT: 1310 U/L — ABNORMAL HIGH (ref 17–63)
ANION GAP: 13 (ref 5–15)
AST: 2273 U/L — ABNORMAL HIGH (ref 15–41)
BUN: 66 mg/dL — ABNORMAL HIGH (ref 6–20)
CALCIUM: 8.8 mg/dL — AB (ref 8.9–10.3)
CO2: 22 mmol/L (ref 22–32)
Chloride: 102 mmol/L (ref 101–111)
Creatinine, Ser: 1.56 mg/dL — ABNORMAL HIGH (ref 0.61–1.24)
GFR calc Af Amer: >60 mL/min (ref >60)
GFR calc non Af Amer: 54 mL/min — ABNORMAL LOW (ref >60)
GLUCOSE: 120 mg/dL — AB (ref 65–99)
POTASSIUM: 3.8 mmol/L (ref 3.5–5.1)
SODIUM: 137 mmol/L (ref 135–145)
Total Bilirubin: 19.1 mg/dL (ref 0.3–1.2)
Total Protein: 6.5 g/dL (ref 6.5–8.1)

## 2015-10-16 LAB — TROPONIN I
Troponin I: 0.07 ng/mL — ABNORMAL HIGH (ref <0.031)
Troponin I: 0.05 ng/mL — ABNORMAL HIGH (ref <0.031)

## 2015-10-16 LAB — VANCOMYCIN, RANDOM: VANCOMYCIN RM: 16 ug/mL

## 2015-10-16 LAB — PHOSPHORUS: PHOSPHORUS: UNDETERMINED mg/dL (ref 2.5–4.6)

## 2015-10-16 MED ORDER — ARGATROBAN 50 MG/50ML IV SOLN
0.0625 ug/kg/min | INTRAVENOUS | Status: DC
  Filled 2015-10-16: qty 50

## 2015-10-16 MED ORDER — LACTULOSE 10 GM/15ML PO SOLN
10.0000 g | Freq: Two times a day (BID) | ORAL | Status: DC
  Administered 2015-10-16: 10 g via ORAL
  Filled 2015-10-16: qty 30

## 2015-10-16 NOTE — Progress Notes (Signed)
[facility]  ROUNDING NOTE   Subjective:   CRRT ran well overnight. BFR 400 UF 50 DFR 2.5K UF 1118  Norepinephrine  Wbc 21.4. On meropenem and vanco.   Objective:  Vital signs in last 24 hours:  Temp:  [98.4 F (36.9 C)-99.1 F (37.3 C)] 99 F (37.2 C) (01/28 0900) Pulse Rate:  [89-127] 103 (01/28 0900) Resp:  [5-32] 5 (01/28 0900) BP: (84-117)/(54-71) 92/58 mmHg (01/28 0900) SpO2:  [94 %-99 %] 96 % (01/28 0900) FiO2 (%):  [70 %] 70 % (01/28 0744) Weight:  [276.8 kg (610 lb 3.7 oz)] 276.8 kg (610 lb 3.7 oz) (01/28 0407)  Weight change: -3.6 kg (-7 lb 15 oz) Filed Weights   10/14/15 0500 10/15/15 0500 10/16/15 0407  Weight: 293.4 kg (646 lb 13.3 oz) 280.4 kg (618 lb 2.7 oz) 276.8 kg (610 lb 3.7 oz)    Intake/Output: I/O last 3 completed shifts: In: 3024.2 [I.V.:544.2; NG/GT:1680; IV Piggyback:800] Out: 1570 [Urine:18; Other:1552]   Intake/Output this shift:  Total I/O In: 30 [NG/GT:30] Out: 97 [Other:97]  Physical Exam: General: Critically ill  Head: +OGT  Eyes: Mild icterus noted  Neck: Tracheostomy in place  Lungs:  Bilateral rhonchi, vent assisted, PRVC Fio2 70%  Heart: S1S2 no rubs  Abdomen:  Soft, nontender, obese  Extremities: 3+ generalized edema.  Neurologic: sedated  Skin: No lesions  Access Right IJ temp HD catheter 1/18 Dr. Gilda Crease  GU: Foley    Basic Metabolic Panel:  Recent Labs Lab 10/11/15 2006  10/14/15 0400  10/14/15 1706 10/14/15 2212 10/15/15 0408 10/15/15 1030 10/15/15 1215 10/15/15 1600 10/16/15 0140  NA 139  < > 138  < > 138 136 136 137  --  138 137  K 4.2  < > 4.6  < > 4.3 4.2 4.2 3.9  --  4.3 3.8  CL 104  < > 103  < > 101 100* 100* 101  --  101 102  CO2 27  < > 26  < > --  19* 22  GLUCOSE 135*  < > 130*  < > 116* 120* 120* 130*  --  121* 120*  BUN 55*  < > 79*  < > 75* 75* 70* 70*  --  70* 66*  CREATININE 2.09*  < > 2.66*  < > 2.35* 2.16* 2.06* 1.99*  --  1.98* 1.56*  CALCIUM 9.1  < >  8.9  < > 9.1 8.7* 8.7* 8.6*  --  8.8* 8.8*  MG 2.0  --  2.0  --   --   --  1.9  --  1.8  --  1.9  PHOS  --   < > 3.2  < > 3.1 UNABLE TO REPORT DUE TO ICTERUS UNABLE TO REPORT DUE TO ICTERUS  --   --  UNABLE TO REPORT DUE TO ICTERUS UNABLE TO REPORT DUE TO ICTERUS  < > = values in this interval not displayed.  Liver Function Tests:  Recent Labs Lab 10/11/15 0429  10/15/15 0408 10/15/15 1030 10/15/15 1215 10/15/15 1600 10/16/15 0140  AST 176*  --   --   --  2266*  --  2273*  ALT 60  --   --   --  1115*  --  1310*  ALKPHOS 103  --   --   --  134*  --  164*  BILITOT 3.8*  --   --   --  16.0*  --  19.1*  PROT 6.9  --   --   --  6.5  --  6.5  ALBUMIN 3.0*  < > 2.4* 2.3* 2.3* 2.5* 2.3*  < > = values in this interval not displayed. No results for input(s): LIPASE, AMYLASE in the last 168 hours. No results for input(s): AMMONIA in the last 168 hours.  CBC:  Recent Labs Lab 10/12/15 1525 10/13/15 0607 10/14/15 0400 10/15/15 0408 10/16/15 0140  WBC 14.1* 14.2* 16.8* 18.0* 21.4*  HGB 10.1* 9.9* 9.2* 9.5* 10.0*  HCT 34.4* 33.5* 31.2* 31.3* 32.7*  MCV 88.4 88.8 85.1 85.5 84.5  PLT 32* 45* 59* 67* 82*    Cardiac Enzymes:  Recent Labs Lab 10/15/15 1749 10/16/15 0140 10/16/15 0620  TROPONINI 0.06* 0.07* 0.05*    BNP: Invalid input(s): POCBNP  CBG:  Recent Labs Lab 10/15/15 1757 10/15/15 1934 10/15/15 2343 10/16/15 0346 10/16/15 0816  GLUCAP 112* 104* 102* 119* 107*    Microbiology: Results for orders placed or performed during the hospital encounter of 08/29/2015  Blood culture (routine x 2)     Status: None   Collection Time: 08/21/2015 11:53 PM  Result Value Ref Range Status   Specimen Description BLOOD LEFT HAND  Final   Special Requests BOTTLES DRAWN AEROBIC AND ANAEROBIC 4CC  Final   Culture NO GROWTH 5 DAYS  Final   Report Status 09/23/2015 FINAL  Final  Blood culture (routine x 2)     Status: None   Collection Time: 09/06/2015 11:53 PM  Result Value Ref  Range Status   Specimen Description BLOOD LEFT ARM  Final   Special Requests BOTTLES DRAWN AEROBIC AND ANAEROBIC 5CC  Final   Culture NO GROWTH 5 DAYS  Final   Report Status 09/23/2015 FINAL  Final  Urine culture     Status: None   Collection Time: 09/18/15 12:21 AM  Result Value Ref Range Status   Specimen Description URINE, RANDOM  Final   Special Requests NONE  Final   Culture MULTIPLE SPECIES PRESENT, SUGGEST RECOLLECTION  Final   Report Status 09/19/2015 FINAL  Final  Rapid Influenza A&B Antigens ([facility] only)     Status: None   Collection Time: 09/18/15  6:26 AM  Result Value Ref Range Status   Influenza A ([facility]) NOT DETECTED  Final   Influenza B ([facility]) NOT DETECTED  Final  MRSA PCR Screening     Status: None   Collection Time: 09/20/15  6:30 AM  Result Value Ref Range Status   MRSA by PCR NEGATIVE NEGATIVE Final    Comment:        The GeneXpert MRSA Assay (FDA approved for NASAL specimens only), is one component of a comprehensive MRSA colonization surveillance program. It is not intended to diagnose MRSA infection nor to guide or monitor treatment for MRSA infections.   Culture, respiratory (NON-Expectorated)     Status: None   Collection Time: 09/22/15  3:13 PM  Result Value Ref Range Status   Specimen Description TRACHEAL ASPIRATE  Final   Special Requests NONE  Final   Gram Stain   Final    GOOD SPECIMEN - 80-90% WBCS MODERATE WBC SEEN RARE YEAST RARE GRAM NEGATIVE COCCOBACILLI    Culture LIGHT GROWTH CANDIDA TROPICALIS  Final   Report Status 09/26/2015 FINAL  Final  Urine culture     Status: None   Collection Time: 09/22/15 11:16 PM  Result Value Ref Range Status   Specimen Description URINE, RANDOM  Final   Special Requests NONE  Final   Culture NO GROWTH 2 DAYS  Final   Report Status 09/24/2015 FINAL  Final  CULTURE, BLOOD (ROUTINE X 2) w Reflex to PCR ID Panel     Status: None   Collection Time: 09/22/15 11:44 PM  Result Value Ref Range Status    Specimen Description BLOOD RIGHT ANTECUBITAL  Final   Special Requests BOTTLES DRAWN AEROBIC AND ANAEROBIC  Final   Culture NO GROWTH 5 DAYS  Final   Report Status 09/28/2015 FINAL  Final  CULTURE, BLOOD (ROUTINE X 2) w Reflex to PCR ID Panel     Status: None   Collection Time: 09/22/15 11:56 PM  Result Value Ref Range Status   Specimen Description BLOOD LEFT HAND  Final   Special Requests BOTTLES DRAWN AEROBIC AND ANAEROBIC  Final   Culture NO GROWTH 5 DAYS  Final   Report Status 09/28/2015 FINAL  Final  Culture, respiratory (NON-Expectorated)     Status: None   Collection Time: 09/23/15 12:01 AM  Result Value Ref Range Status   Specimen Description TRACHEAL ASPIRATE  Final   Special Requests NONE  Final   Gram Stain   Final    GOOD SPECIMEN - 80-90% WBCS MODERATE WBC SEEN RARE YEAST RARE GRAM POSITIVE COCCI    Culture Consistent with normal respiratory flora.  Final   Report Status 09/25/2015 FINAL  Final  Urine culture     Status: None   Collection Time: 10/01/15 10:35 AM  Result Value Ref Range Status   Specimen Description URINE, RANDOM  Final   Special Requests NONE  Final   Culture NO GROWTH 2 DAYS  Final   Report Status 10/03/2015 FINAL  Final  CULTURE, BLOOD (ROUTINE X 2) w Reflex to PCR ID Panel     Status: None   Collection Time: 10/01/15 10:56 AM  Result Value Ref Range Status   Specimen Description BLOOD RIGHT ARM  Final   Special Requests   Final    BOTTLES DRAWN AEROBIC AND ANAEROBIC  AER 3CC ANA 5CC   Culture NO GROWTH 5 DAYS  Final   Report Status 10/06/2015 FINAL  Final  CULTURE, BLOOD (ROUTINE X 2) w Reflex to PCR ID Panel     Status: None   Collection Time: 10/01/15 11:14 AM  Result Value Ref Range Status   Specimen Description BLOOD LEFT HAND  Final   Special Requests   Final    BOTTLES DRAWN AEROBIC AND ANAEROBIC  AER 3CC ANA 4CC   Culture  Setup Time   Final    GRAM POSITIVE COCCI ANAEROBIC BOTTLE ONLY CRITICAL RESULT CALLED TO, READ BACK  BY AND VERIFIED WITH: CRYSTAL SCARPENA AT 0915 ON 10/02/15 CTJ    Culture   Final    COAGULASE NEGATIVE STAPHYLOCOCCUS ANAEROBIC BOTTLE ONLY Results consistent with contamination.    Report Status 10/06/2015 FINAL  Final  Blood Culture ID Panel (Reflexed)     Status: Abnormal   Collection Time: 10/01/15 11:14 AM  Result Value Ref Range Status   Enterococcus species NOT DETECTED NOT DETECTED Final   Listeria monocytogenes NOT DETECTED NOT DETECTED Final   Staphylococcus species DETECTED (A) NOT DETECTED Final   Staphylococcus aureus NOT DETECTED NOT DETECTED Final   Streptococcus species NOT DETECTED NOT DETECTED Final   Streptococcus agalactiae NOT DETECTED NOT DETECTED Final   Streptococcus pneumoniae NOT DETECTED NOT DETECTED Final   Streptococcus pyogenes NOT DETECTED NOT DETECTED Final   Acinetobacter baumannii NOT DETECTED NOT DETECTED Final  Enterobacteriaceae species NOT DETECTED NOT DETECTED Final   Enterobacter cloacae complex NOT DETECTED NOT DETECTED Final   Escherichia coli NOT DETECTED NOT DETECTED Final   Klebsiella oxytoca NOT DETECTED NOT DETECTED Final   Klebsiella pneumoniae NOT DETECTED NOT DETECTED Final   Proteus species NOT DETECTED NOT DETECTED Final   Serratia marcescens NOT DETECTED NOT DETECTED Final   Haemophilus influenzae NOT DETECTED NOT DETECTED Final   Neisseria meningitidis NOT DETECTED NOT DETECTED Final   Pseudomonas aeruginosa NOT DETECTED NOT DETECTED Final   Candida albicans NOT DETECTED NOT DETECTED Final   Candida glabrata NOT DETECTED NOT DETECTED Final   Candida krusei NOT DETECTED NOT DETECTED Final   Candida parapsilosis NOT DETECTED NOT DETECTED Final   Candida tropicalis NOT DETECTED NOT DETECTED Final   Carbapenem resistance NOT DETECTED NOT DETECTED Final   Methicillin resistance DETECTED (A) NOT DETECTED Final    Comment: CRITICAL RESULT CALLED TO, READ BACK BY AND VERIFIED WITH: CRYSTAL SCARPENA FOR STAPHYLOCOCCUS SPECIES AND  METHICILLIN RESISTANCE AT 0915 ON 10/02/15 CTJ    Vancomycin resistance NOT DETECTED NOT DETECTED Final  Culture, expectorated sputum-assessment     Status: None   Collection Time: 10/01/15 11:51 AM  Result Value Ref Range Status   Specimen Description EXPECTORATED SPUTUM  Final   Special Requests Normal  Final   Sputum evaluation THIS SPECIMEN IS ACCEPTABLE FOR SPUTUM CULTURE  Final   Report Status 10/01/2015 FINAL  Final  Culture, respiratory (NON-Expectorated)     Status: None   Collection Time: 10/01/15 11:51 AM  Result Value Ref Range Status   Specimen Description EXPECTORATED SPUTUM  Final   Special Requests Normal Reflexed from [id number]  Final   Gram Stain   Final    GOOD SPECIMEN - 80-90% WBCS MODERATE WBC SEEN FEW YEAST    Culture   Final    LIGHT GROWTH CANDIDA ALBICANS LIGHT GROWTH CANDIDA DUBLINIENSIS    Report Status 10/06/2015 FINAL  Final  Culture, expectorated sputum-assessment     Status: None   Collection Time: 10/08/15  3:30 PM  Result Value Ref Range Status   Specimen Description TRACHEAL ASPIRATE  Final   Special Requests NONE  Final   Sputum evaluation THIS SPECIMEN IS ACCEPTABLE FOR SPUTUM CULTURE  Final   Report Status 10/08/2015 FINAL  Final  Culture, respiratory (NON-Expectorated)     Status: None   Collection Time: 10/08/15  3:30 PM  Result Value Ref Range Status   Specimen Description TRACHEAL ASPIRATE  Final   Special Requests NONE Reflexed from [id number]  Final   Gram Stain   Final    FAIR SPECIMEN - 70-80% WBCS FEW WBC SEEN RARE GRAM POSITIVE COCCI RARE GRAM NEGATIVE RODS    Culture Consistent with normal respiratory flora.  Final   Report Status 10/11/2015 FINAL  Final  Culture, bal-quantitative     Status: None (Preliminary result)   Collection Time: 10/12/15 12:16 PM  Result Value Ref Range Status   Specimen Description BRONCHIAL ALVEOLAR LAVAGE  Final   Special Requests Immunocompromised  Final   Gram Stain   Final    FEW WBC SEEN FEW  GRAM POSITIVE COCCI FAIR SPECIMEN - 70-80% WBCS    Culture HOLDING FOR POSSIBLE PATHOGEN  Final   Report Status PENDING  Incomplete  CULTURE, BLOOD (ROUTINE X 2) w Reflex to PCR ID Panel     Status: None (Preliminary result)   Collection Time: 10/12/15  3:59 PM  Result Value Ref Range Status  Specimen Description BLOOD RIGHT ARM  Final   Special Requests   Final    BOTTLES DRAWN AEROBIC AND ANAEROBIC 7CC AERO 9CC ANA   Culture NO GROWTH 4 DAYS  Final   Report Status PENDING  Incomplete  CULTURE, BLOOD (ROUTINE X 2) w Reflex to PCR ID Panel     Status: None (Preliminary result)   Collection Time: 10/12/15  4:07 PM  Result Value Ref Range Status   Specimen Description BLOOD LEFT HAND  Final   Special Requests   Final    BOTTLES DRAWN AEROBIC AND ANAEROBIC 7CC AERO 9CC ANA   Culture  Setup Time   Final    GRAM POSITIVE COCCI AEROBIC BOTTLE ONLY CRITICAL RESULT CALLED TO, READ BACK BY AND VERIFIED WITH: MATT MCBANE AT 6962 10/14/15 DV    Culture   Final    COAGULASE NEGATIVE STAPHYLOCOCCUS AEROBIC BOTTLE ONLY Results consistent with contamination.    Report Status PENDING  Incomplete  Blood Culture ID Panel (Reflexed)     Status: Abnormal   Collection Time: 10/12/15  4:07 PM  Result Value Ref Range Status   Enterococcus species NOT DETECTED NOT DETECTED Final   Listeria monocytogenes NOT DETECTED NOT DETECTED Final   Staphylococcus species DETECTED (A) NOT DETECTED Final    Comment: CRITICAL RESULT CALLED TO, READ BACK BY AND VERIFIED WITH: MATT MCBANE AT 9528 10/14/15 DV    Staphylococcus aureus NOT DETECTED NOT DETECTED Final   Streptococcus species NOT DETECTED NOT DETECTED Final   Streptococcus agalactiae NOT DETECTED NOT DETECTED Final   Streptococcus pneumoniae NOT DETECTED NOT DETECTED Final   Streptococcus pyogenes NOT DETECTED NOT DETECTED Final   Acinetobacter baumannii NOT DETECTED NOT DETECTED Final   Enterobacteriaceae species NOT DETECTED NOT DETECTED Final    Enterobacter cloacae complex NOT DETECTED NOT DETECTED Final   Escherichia coli NOT DETECTED NOT DETECTED Final   Klebsiella oxytoca NOT DETECTED NOT DETECTED Final   Klebsiella pneumoniae NOT DETECTED NOT DETECTED Final   Proteus species NOT DETECTED NOT DETECTED Final   Serratia marcescens NOT DETECTED NOT DETECTED Final   Haemophilus influenzae NOT DETECTED NOT DETECTED Final   Neisseria meningitidis NOT DETECTED NOT DETECTED Final   Pseudomonas aeruginosa NOT DETECTED NOT DETECTED Final   Candida albicans NOT DETECTED NOT DETECTED Final   Candida glabrata NOT DETECTED NOT DETECTED Final   Candida krusei NOT DETECTED NOT DETECTED Final   Candida parapsilosis NOT DETECTED NOT DETECTED Final   Candida tropicalis NOT DETECTED NOT DETECTED Final   Carbapenem resistance NOT DETECTED NOT DETECTED Final   Methicillin resistance DETECTED (A) NOT DETECTED Final    Comment: CRITICAL RESULT CALLED TO, READ BACK BY AND VERIFIED WITH: MATT MCBANE AT 0640 10/14/15 DV    Vancomycin resistance NOT DETECTED NOT DETECTED Final    Coagulation Studies:  Recent Labs  10/15/15 1306  LABPROT 22.4*  INR 1.98    Urinalysis: No results for input(s): COLORURINE, LABSPEC, PHURINE, GLUCOSEU, HGBUR, BILIRUBINUR, KETONESUR, PROTEINUR, UROBILINOGEN, NITRITE, LEUKOCYTESUR in the last 72 hours.  Invalid input(s): APPERANCEUR    Imaging: No results found.   Medications:   . argatroban 0.125 mcg/kg/min (10/16/15 0700)  . feeding supplement (VITAL HIGH PROTEIN) 1,000 mL (10/16/15 0740)  . norepinephrine (LEVOPHED) Adult infusion 13.973 mcg/min (10/16/15 0700)  . pureflow 2,500 mL/hr at 10/16/15 0351   . ALPRAZolam  0.5 mg Oral TID  . alteplase  2 mg Intracatheter Once  . alteplase  2 mg Intracatheter Once  . amiodarone  200 mg Per Tube BID  . antiseptic oral rinse  7 mL Mouth Rinse 10 times per day  . aspirin  325 mg Oral Daily  . budesonide  0.25 mg Nebulization 4 times per day  . chlorhexidine  gluconate  15 mL Mouth Rinse BID  . feeding supplement (PRO-STAT SUGAR FREE 64)  30 mL Per Tube TID  . free water  30 mL Per Tube 6 times per day  . Influenza vac split quadrivalent PF  0.5 mL Intramuscular Tomorrow-1000  . ipratropium-albuterol  3 mL Nebulization Q6H  . lactulose  10 g Oral Daily  . meropenem (MERREM) IV  1 g Intravenous Q12H  . nystatin   Topical BID  . pantoprazole sodium  40 mg Per Tube BID  . senna-docusate  1 tablet Oral BID  . vancomycin  1,500 mg Intravenous Q24H   acetaminophen **OR** acetaminophen, albuterol, fentaNYL (SUBLIMAZE) injection, metoprolol, midazolam, ondansetron **OR** ondansetron (ZOFRAN) IV, sodium chloride  Assessment/ Plan:  Mr. Snyder Colavito is a 40 y.o. black male with morbid obesity, tobacco abuse, without prescribed home medications, who was admitted to [facility] on 08/25/2015, s/p tracheostomy placement, persistent respiratory failure, multiple episodes of ARF  1. Acute Renal Failure:  Second episode of ARF, reconsulted on 10/05/15, acute renal failure anuric. Baseline creatinine of 1.15 on admission. Started renal replacement therapy 10/06/15, trial of intermittent hemodialysis 1/24. Restarted 1/25 CRRT.   - Continue CRRT. Patient hemodynamically unstable. anuric.  - Continue to monitor volume status, urine output, serum electrolytes and renal function.  - Renally dose all medications   2. Acute hypercapnic respiratory failure with bilateral pneumonia and acute exacerbation of systolic congestive heart failure with respiratory acidosis:  With underlying obesity hypoventilation syndrome. Tracheostomy 10/30/15. Ventilator dependent.  - Appreciate pulm input.   3.  Anemia with renal failure: hemoglobin 10. With thrombocytopenia 82 - No acute indication for epo or transfusion. - Avoid heparin products. HIT positive - Will give TPA and monitor closely.   4. Hypotension/sepsis: on vasopressors: norepinephrine. Wbc trending upward. Blood cultures  growing staph.  - exchanging line as per critical care.  - merepenem and vanco - Appreciate ID input.    LOS: 29 Mitchell Morrow 1/28/20179:22 AM

## 2015-10-16 NOTE — Progress Notes (Signed)
[facility] [place] NAME: Emarion Toral    MR#:  [id number]  DATE OF BIRTH:  June 12, 1976  SUBJECTIVE:  Tolerating CRRT now  Patient remains sedated on the ventilator via tracheostomy but O2 requirements increasing.  -Tube feeding via NG. -IV levophed  Remains critically ill with multi-organ failure.   REVIEW OF SYSTEMS:   Review of Systems  Unable to perform ROS: intubated   DRUG ALLERGIES:  No Known Allergies  VITALS:  Blood pressure 92/66, pulse 114, temperature 99.7 F (37.6 C), temperature source Rectal, resp. rate 28, height _0  (1.753 m), weight 276.8 kg (610 lb 3.7 oz), SpO2 91 %.  PHYSICAL EXAMINATION:   Physical Exam  Constitutional: He is well-developed, well-nourished, and in no distress. No distress.  Morbidly obese  HENT:  Head: Normocephalic.  Eyes: No scleral icterus.  Neck: Neck supple. No JVD present. No tracheal deviation present.  Tracheostomy placed  Cardiovascular: Normal rate, regular rhythm and normal heart sounds.  Exam reveals no gallop and no friction rub.   No murmur heard. Pulmonary/Chest: Effort normal and breath sounds normal. No respiratory distress. He has no wheezes. He has no rales. He exhibits no tenderness.  Abdominal: Soft. Bowel sounds are normal. He exhibits no distension and no mass. There is no tenderness. There is no rebound and no guarding.  Musculoskeletal: Normal range of motion. He exhibits edema.  Neurological:  Eyes open but patient does not follow commands  Skin: Skin is warm. No rash noted. No erythema.       LABORATORY PANEL:   CBC  Recent Labs Lab 10/16/15 0140  WBC 21.4*  HGB 10.0*  HCT 32.7*  PLT 82*   ------------------------------------------------------------------------------------------------------------------  Chemistries   Recent Labs Lab 10/16/15 0140 10/16/15 1006  NA 137 137  K 3.8 3.8  CL 102 102  CO2 22 23  GLUCOSE 120* 121*   BUN 66* 63*  CREATININE 1.56* 1.53*  CALCIUM 8.8* 8.8*  MG 1.9  --   AST 2273*  --   ALT 1310*  --   ALKPHOS 164*  --   BILITOT 19.1*  --     RADIOLOGY:  Dg Chest Port 1 View  10/16/2015  CLINICAL DATA:  PICC line placement. EXAM: PORTABLE CHEST 1 VIEW COMPARISON:  10/16/2015, earlier the same day. FINDINGS: 1509 hours. Low volume film. Patient rotated to the left. X-ray is under penetrated. The right PICC line has been repositioned in the interval no longer courses cranially in the neck. The tip of the right PICC line is not well visualized and may be positioned the level of the proximal SVC, but superimposition of cardiomediastinal tissues makes precise tip localization difficult. The right IJ catheter remains in place. Left IJ catheter is again no. Tracheostomy tube again noted. There is an NG tube visualized although it becomes obscure over the mediastinum. Lung volumes are low. There is vascular congestion with probable associated pulmonary edema and left base collapse/ consolidation. Telemetry leads overlie the chest. IMPRESSION: Right PICC line has been repositioned in the interval and is now directed centrally although the tip cannot be confidently visualized secondary to obscuration by cardiomediastinal tissues. Otherwise stable exam. Electronically Signed   By: Misty Stanley M.D.   On: 10/16/2015 15:28   Dg Chest Port 1 View  10/16/2015  CLINICAL DATA:  40 year old male with PICC line placement. Respiratory failure. EXAM: PORTABLE CHEST 1 VIEW COMPARISON:  10/14/2015 and prior exams FINDINGS: A right PICC line  is identified extending cephalad in the neck with tip overlying the mid -upper right neck. Tracheostomy tube, bilateral IJ central venous catheters and NG tube again noted. Cardiomegaly with pulmonary vascular congestion, pulmonary edema and left lower lung consolidation/atelectasis again noted. There is no evidence of pneumothorax. IMPRESSION: Right PICC line extending cephalad into  the neck with tip overlying the mid -upper right neck. Recommend repositioning. Otherwise no significant change. Cardiomegaly, pulmonary vascular congestion, edema and left lower lung consolidation/atelectasis. This was discussed with Charli, R.N., at 3:05 p.m. on 10/16/2015. Electronically Signed   By: Margarette Canada M.D.   On: 10/16/2015 15:07    ASSESSMENT AND PLAN:   39 year old male with a history of morbid obesity and tobacco abuse who presented with lower extremity edema and found to have bilateral pneumonia.  1. Acute hypoxic hypercapnic respiratory failure-:  -multifactorial  due to obesity hypoventilation syndrome, community-acquired pneumonia and pulmonary edema, ARDS.   - Patient remains Intubated and s/p Tracheostomy done on 10/13/2015. Continue vent management as per pulmonary and very difficult to wean.  O2 requirements going up.  - Patient had completed course of antibiotics for treatment of pneumonia with vancomycin and CEFTAZ. - spiked fever on 1/25 and now per ID started back on Vanc and meropenem day 2 - BC (PCR) from 10/12/15 Positive for MRSA  2. Septic Shock - due to pneumonia presumably.   - cont. IV Vanco, Meropenem.  - cont. IV Levophed. Keep MAP > 60.    3.New onset systolic congestive heart failure,  EF 45% :  - Continue CRRT  4. Acute renal failure - Due to ATN and diuresis Due to persistent oliguria patient was started on  CRRT on 10/06/15. Hemodialysis was attempted however patient did not tolerate this well and now is back on CRRT.   5. Nutrition-Currently has an NG tube with tube feeds. Needs a PEG tube but due to his large body habitus,GI cannot do it endoscopically and as per IR team, patient will be too heavy for a CT table and also the fluoroscopic table due to his weight. So cannot be done by interventional radiology. Surgery services also say they cannot place the PEG tube. Patient will need to have an open procedure for his PEG tube placement by surgical  team, however will be a high risk procedure considering his body habitus and also his other multiple medical problems. - we have tried contacting tertiary care centers at [place] but they have refused to do it due to his comorbidities and poor functional status  6. Hyperbilirubinemia:due to chronic passive congestion from congestive heart failure. - hepatitis panel (-) and cannot do any diagnostic tests due to his morbid obesity.   7. Atrial fibrillation - known history of paroxysmal atrial fibrillation. Cont. Amio.  - high Risk of long-term full dose anticoagulation due to thrombocytopenia.   8. Anemia of chronic disease and illness: Continue to monitor hemoglobin. - No indication for blood transfusion  9. 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  - avoid heparin. Cont. Argatroban.  Follow Plt. Count.   Dr. Mortimer Fries met with patient's family to discuss code status and they may make pt. DNR once the pt's sister arrives.  Cont. Current supportive care for now.   CODE STATUS: FULL  TOTAL CRITICAL CARE TIME SPENT IN TAKING CARE OF THIS PATIENT: 30 critical care minutes.   He remains critically ill and high risk for cardiopulmonary arrest.     Henreitta Leber M.D on 10/16/2015  Between 7am to 6pm - Pager - [phone]  After 6pm go to [email or web address] - password [email or web address] [facility]  [facility]  Office  [phone]  CC: Primary care physician; No PCP Per Patient  Note: This dictation was prepared with Dragon dictation along with smaller phrase technology. Any transcriptional errors that result from this process are unintentional.

## 2015-10-16 NOTE — Progress Notes (Addendum)
ANTICOAGULATION CONSULT NOTE - Follow Up Consult  Pharmacy Consult for Argatroban Indication: possible PE  No Known Allergies  Patient Measurements: Height:  (175.3 cm) Weight: (!) 610 lb 3.7 oz (276.8 kg) IBW/kg (Calculated) : 70.7    Vital Signs: Temp: 99.2 F (37.3 C) (01/28 2000) Temp Source: Oral (01/28 2000) BP: 97/54 mmHg (01/28 2300) Pulse Rate: 113 (01/28 2300)  Labs:  Recent Labs  10/14/15 0400  10/15/15 0408  10/15/15 1306  10/15/15 1749  10/16/15 0140 10/16/15 0620  10/16/15 1006 10/16/15 1447 10/16/15 1625 10/16/15 1748 10/16/15 2220  HGB 9.2*  --  9.5*  --   --   --   --   --  10.0*  --   --   --   --   --   --   --   HCT 31.2*  --  31.3*  --   --   --   --   --  32.7*  --   --   --   --   --   --   --   PLT 59*  --  67*  --   --   --   --   --  82*  --   --   --   --   --   --   --   APTT  --   --   --   --  44*  --   --   < > 156*  --   < > 144* 114*  --  143* 128*  LABPROT  --   --   --   --  22.4*  --   --   --   --   --   --   --   --   --   --   --   INR  --   --   --   --  1.98  --   --   --   --   --   --   --   --   --   --   --   CREATININE 2.66*  < > 2.06*  < >  --   < >  --   --  1.56*  --   --  1.53*  --  1.47*  --  1.31*  TROPONINI  --   --   --   --   --   --  0.06*  --  0.07* 0.05*  --   --   --   --   --   --   < > = values in this interval not displayed.  Estimated Creatinine Clearance: 160.7 mL/min (by C-G formula based on Cr of 1.31).   Medical History: Past Medical History  Diagnosis Date  . Super obesity (HCC)   . Tobacco abuse   . Chronic systolic CHF (congestive heart failure) (HCC)   . Atrial fibrillation (HCC) 10/30/15    Medications:  Scheduled:  . ALPRAZolam  0.5 mg Oral TID  . alteplase  2 mg Intracatheter Once  . alteplase  2 mg Intracatheter Once  . amiodarone  200 mg Per Tube BID  . antiseptic oral rinse  7 mL Mouth Rinse 10 times per day  . aspirin  325 mg Oral Daily  . budesonide  0.25 mg Nebulization  4 times per day  . chlorhexidine gluconate  15 mL Mouth Rinse BID  . feeding supplement (PRO-STAT SUGAR FREE 64)  30 mL Per Tube TID  . free water  30 mL Per Tube 6 times per day  . Influenza vac split quadrivalent PF  0.5 mL Intramuscular Tomorrow-1000  . ipratropium-albuterol  3 mL Nebulization Q6H  . lactulose  10 g Oral BID  . meropenem (MERREM) IV  1 g Intravenous Q12H  . nystatin   Topical BID  . pantoprazole sodium  40 mg Per Tube BID  . senna-docusate  1 tablet Oral BID  . vancomycin  1,500 mg Intravenous Q24H   Infusions:  . argatroban 0.0625 mcg/kg/min (10/16/15 1900)  . feeding supplement (VITAL HIGH PROTEIN) 1,000 mL (10/16/15 0740)  . norepinephrine (LEVOPHED) Adult infusion 20 mcg/min (10/16/15 2300)  . pureflow 2,500 mL/hr at 10/16/15 1427    Assessment: 40 y/o morbidly obese male with respiratory failure. Patient has thrombocytopenia and possible HIT although SRA results of 1 make HIT less likely. There is some concern for clot, possible PE and patient is not a candidate for mechanical DVT prophylaxis due to size.   Child Pugh Score > or = 7  Goal of Therapy:  aPTT 45-95 seconds Monitor platelets by anticoagulation protocol: Yes   Plan:  Due to Child Pugh score > 6, started argatroban at reduced rate of 0.5 mcg/kg/min. Check APTT 3 hours after starting infusion.  1/27:  APTT @ 19:00 = 124 Will decrease gtt rate by 0.17mcg/kg/min .  Will recheck aPTT 3 hrs after rate change on 1/28 @ 00:00.   1/28 01:30 aPTT 156. Current argatroban rate 0.25 mcg/kg/min. Recommended rate reduction per protocol is also 0.25 mcg/kg/min so will reduce dose by 50% and recheck in 3 hours.  1/28 0730 aPTT 137. Rate already reduced to 0.125 mcg/kg/min by RN. Will recheck aPTT in 3 hours.  01/28 1050 aPTT 144  aPTT continues to increase despite reduced rate. Will hold argatroban infusion for 30 minutes and resume infusion based on ideal body weight instead of adjusted body weight at 0.125  mcg/kg/min. Will recheck aPTT in 3 hours.    01/28 1525 aPTT 114 aPTT is coming down but is still above goal. Rate is at 0.125 mcg/kg/min based on IBW so may not be able to go much lower. Will continue current rate for now and check another aPTT in 3 hours.   01/28  1748 aPTT 143  Will hold argatroban infusion for 30 min and then decrease rate to 0.0682mcg/kg/min. Pump unable to run at 0.265 so it will have to run at 0.55ml/hr. Not sure we can run the rate any slower. Will recheck in 3 hours from the drip being turned back on. Discussed this with the RN  1/28 PM aPTT 128. Pump is at lowest volume measurable. Discussed with MD. Will continue current rate and continue to monitor aPTT and watch patient for bleeding. Plan discussed with RN also. APTT ordered with AM labs.  1/29 AM aPTT 134. Will continue with current rate per earlier conversation with hospitalist. Recheck aPTT and CBC in AM.  Fulton Reek, PharmD, BCPS  10/17/2015

## 2015-10-16 NOTE — Progress Notes (Signed)
PHARMACY - CRITICAL CARE PROGRESS NOTE  Pharmacy Consult for Meropenem, Vancomycin, CRRT medication management, Constipation Prevention, Electrolyte Management      No Known Allergies  Patient Measurements: Height: 5\' 9"  (175.3 cm) Weight: (!) 610 lb 3.7 oz (276.8 kg) IBW/kg (Calculated) : 70.7   Vital Signs: Temp: 98.8 F (37.1 C) (01/28 0700) BP: 96/63 mmHg (01/28 0700) Pulse Rate: 102 (01/28 0700) Intake/Output from previous day: 01/27 0701 - 01/28 0700 In: 1885 [I.V.:375; NG/GT:810; IV Piggyback:700] Out: 1126 [Urine:8] Intake/Output from this shift: Total I/O In: 30 [NG/GT:30] Out: -  Vent settings for last 24 hours: Vent Mode:  [-] PRVC FiO2 (%):  [70 %] 70 % Set Rate:  [30 bmp] 30 bmp Vt Set:  [500 mL-550 mL] 550 mL PEEP:  [6 cmH20-8 cmH20] 8 cmH20 Plateau Pressure:  [16 cmH20-34 cmH20] 34 cmH20  Labs:  Recent Labs  10/14/15 0400  10/15/15 0408 10/15/15 1030 10/15/15 1215  10/15/15 1306 10/15/15 1600 10/15/15 2001 10/16/15 0140 10/16/15 0652  WBC 16.8*  --  18.0*  --   --   --   --   --   --  21.4*  --   HGB 9.2*  --  9.5*  --   --   --   --   --   --  10.0*  --   HCT 31.2*  --  31.3*  --   --   --   --   --   --  32.7*  --   PLT 59*  --  67*  --   --   --   --   --   --  82*  --   APTT  --   --   --   --   --   < > 44*  --  124* 156* 137*  INR  --   --   --   --   --   --  1.98  --   --   --   --   CREATININE 2.66*  < > 2.06* 1.99*  --   --   --  1.98*  --  1.56*  --   MG 2.0  --  1.9  --  1.8  --   --   --   --  1.9  --   PHOS 3.2  < > UNABLE TO REPORT DUE TO ICTERUS  --   --   --   --  UNABLE TO REPORT DUE TO ICTERUS  --  UNABLE TO REPORT DUE TO ICTERUS  --   ALBUMIN 2.5*  < > 2.4* 2.3* 2.3*  --   --  2.5*  --  2.3*  --   PROT  --   --   --   --  6.5  --   --   --   --  6.5  --   AST  --   --   --   --  2266*  --   --   --   --  2273*  --   ALT  --   --   --   --  1115*  --   --   --   --  1310*  --   ALKPHOS  --   --   --   --  134*  --   --   --    --  164*  --   BILITOT  --   --   --   --  16.0*  --   --   --   --  19.1*  --   BILIDIR  --   --   --   --  13.5*  --   --   --   --   --   --   IBILI  --   --   --   --  2.5*  --   --   --   --   --   --   < > = values in this interval not displayed. Estimated Creatinine Clearance: 134.9 mL/min (by C-G formula based on Cr of 1.56).   Recent Labs  10/15/15 1934 10/15/15 2343 10/16/15 0346  GLUCAP 104* 102* 119*    Microbiology: Recent Results (from the past 720 hour(s))  Blood culture (routine x 2)     Status: None   Collection Time: 09/10/2015 11:53 PM  Result Value Ref Range Status   Specimen Description BLOOD LEFT HAND  Final   Special Requests BOTTLES DRAWN AEROBIC AND ANAEROBIC 4CC  Final   Culture NO GROWTH 5 DAYS  Final   Report Status 09/23/2015 FINAL  Final  Blood culture (routine x 2)     Status: None   Collection Time: 09/01/2015 11:53 PM  Result Value Ref Range Status   Specimen Description BLOOD LEFT ARM  Final   Special Requests BOTTLES DRAWN AEROBIC AND ANAEROBIC 5CC  Final   Culture NO GROWTH 5 DAYS  Final   Report Status 09/23/2015 FINAL  Final  Urine culture     Status: None   Collection Time: 09/18/15 12:21 AM  Result Value Ref Range Status   Specimen Description URINE, RANDOM  Final   Special Requests NONE  Final   Culture MULTIPLE SPECIES PRESENT, SUGGEST RECOLLECTION  Final   Report Status 09/19/2015 FINAL  Final  Rapid Influenza A&B Antigens ([facility] only)     Status: None   Collection Time: 09/18/15  6:26 AM  Result Value Ref Range Status   Influenza A ([facility]) NOT DETECTED  Final   Influenza B ([facility]) NOT DETECTED  Final  MRSA PCR Screening     Status: None   Collection Time: 09/20/15  6:30 AM  Result Value Ref Range Status   MRSA by PCR NEGATIVE NEGATIVE Final    Comment:        The GeneXpert MRSA Assay (FDA approved for NASAL specimens only), is one component of a comprehensive MRSA colonization surveillance program. It is not intended to  diagnose MRSA infection nor to guide or monitor treatment for MRSA infections.   Culture, respiratory (NON-Expectorated)     Status: None   Collection Time: 09/22/15  3:13 PM  Result Value Ref Range Status   Specimen Description TRACHEAL ASPIRATE  Final   Special Requests NONE  Final   Gram Stain   Final    GOOD SPECIMEN - 80-90% WBCS MODERATE WBC SEEN RARE YEAST RARE GRAM NEGATIVE COCCOBACILLI    Culture LIGHT GROWTH CANDIDA TROPICALIS  Final   Report Status 09/26/2015 FINAL  Final  Urine culture     Status: None   Collection Time: 09/22/15 11:16 PM  Result Value Ref Range Status   Specimen Description URINE, RANDOM  Final   Special Requests NONE  Final   Culture NO GROWTH 2 DAYS  Final   Report Status 09/24/2015 FINAL  Final  CULTURE, BLOOD (ROUTINE X 2) w Reflex to PCR ID Panel     Status: None   Collection Time: 09/22/15 11:44  PM  Result Value Ref Range Status   Specimen Description BLOOD RIGHT ANTECUBITAL  Final   Special Requests BOTTLES DRAWN AEROBIC AND ANAEROBIC  Final   Culture NO GROWTH 5 DAYS  Final   Report Status 09/28/2015 FINAL  Final  CULTURE, BLOOD (ROUTINE X 2) w Reflex to PCR ID Panel     Status: None   Collection Time: 09/22/15 11:56 PM  Result Value Ref Range Status   Specimen Description BLOOD LEFT HAND  Final   Special Requests BOTTLES DRAWN AEROBIC AND ANAEROBIC  Final   Culture NO GROWTH 5 DAYS  Final   Report Status 09/28/2015 FINAL  Final  Culture, respiratory (NON-Expectorated)     Status: None   Collection Time: 09/23/15 12:01 AM  Result Value Ref Range Status   Specimen Description TRACHEAL ASPIRATE  Final   Special Requests NONE  Final   Gram Stain   Final    GOOD SPECIMEN - 80-90% WBCS MODERATE WBC SEEN RARE YEAST RARE GRAM POSITIVE COCCI    Culture Consistent with normal respiratory flora.  Final   Report Status 09/25/2015 FINAL  Final  Urine culture     Status: None   Collection Time: 10/01/15 10:35 AM  Result Value Ref  Range Status   Specimen Description URINE, RANDOM  Final   Special Requests NONE  Final   Culture NO GROWTH 2 DAYS  Final   Report Status 10/03/2015 FINAL  Final  CULTURE, BLOOD (ROUTINE X 2) w Reflex to PCR ID Panel     Status: None   Collection Time: 10/01/15 10:56 AM  Result Value Ref Range Status   Specimen Description BLOOD RIGHT ARM  Final   Special Requests   Final    BOTTLES DRAWN AEROBIC AND ANAEROBIC  AER 3CC ANA 5CC   Culture NO GROWTH 5 DAYS  Final   Report Status 10/06/2015 FINAL  Final  CULTURE, BLOOD (ROUTINE X 2) w Reflex to PCR ID Panel     Status: None   Collection Time: 10/01/15 11:14 AM  Result Value Ref Range Status   Specimen Description BLOOD LEFT HAND  Final   Special Requests   Final    BOTTLES DRAWN AEROBIC AND ANAEROBIC  AER 3CC ANA 4CC   Culture  Setup Time   Final    GRAM POSITIVE COCCI ANAEROBIC BOTTLE ONLY CRITICAL RESULT CALLED TO, READ BACK BY AND VERIFIED WITH: CRYSTAL SCARPENA AT 0915 ON 10/02/15 CTJ    Culture   Final    COAGULASE NEGATIVE STAPHYLOCOCCUS ANAEROBIC BOTTLE ONLY Results consistent with contamination.    Report Status 10/06/2015 FINAL  Final  Blood Culture ID Panel (Reflexed)     Status: Abnormal   Collection Time: 10/01/15 11:14 AM  Result Value Ref Range Status   Enterococcus species NOT DETECTED NOT DETECTED Final   Listeria monocytogenes NOT DETECTED NOT DETECTED Final   Staphylococcus species DETECTED (A) NOT DETECTED Final   Staphylococcus aureus NOT DETECTED NOT DETECTED Final   Streptococcus species NOT DETECTED NOT DETECTED Final   Streptococcus agalactiae NOT DETECTED NOT DETECTED Final   Streptococcus pneumoniae NOT DETECTED NOT DETECTED Final   Streptococcus pyogenes NOT DETECTED NOT DETECTED Final   Acinetobacter baumannii NOT DETECTED NOT DETECTED Final   Enterobacteriaceae species NOT DETECTED NOT DETECTED Final   Enterobacter cloacae complex NOT DETECTED NOT DETECTED Final   Escherichia coli NOT DETECTED NOT  DETECTED Final   Klebsiella oxytoca NOT DETECTED NOT DETECTED Final   Klebsiella pneumoniae NOT  DETECTED NOT DETECTED Final   Proteus species NOT DETECTED NOT DETECTED Final   Serratia marcescens NOT DETECTED NOT DETECTED Final   Haemophilus influenzae NOT DETECTED NOT DETECTED Final   Neisseria meningitidis NOT DETECTED NOT DETECTED Final   Pseudomonas aeruginosa NOT DETECTED NOT DETECTED Final   Candida albicans NOT DETECTED NOT DETECTED Final   Candida glabrata NOT DETECTED NOT DETECTED Final   Candida krusei NOT DETECTED NOT DETECTED Final   Candida parapsilosis NOT DETECTED NOT DETECTED Final   Candida tropicalis NOT DETECTED NOT DETECTED Final   Carbapenem resistance NOT DETECTED NOT DETECTED Final   Methicillin resistance DETECTED (A) NOT DETECTED Final    Comment: CRITICAL RESULT CALLED TO, READ BACK BY AND VERIFIED WITH: CRYSTAL SCARPENA FOR STAPHYLOCOCCUS SPECIES AND METHICILLIN RESISTANCE AT 0915 ON 10/02/15 CTJ    Vancomycin resistance NOT DETECTED NOT DETECTED Final  Culture, expectorated sputum-assessment     Status: None   Collection Time: 10/01/15 11:51 AM  Result Value Ref Range Status   Specimen Description EXPECTORATED SPUTUM  Final   Special Requests Normal  Final   Sputum evaluation THIS SPECIMEN IS ACCEPTABLE FOR SPUTUM CULTURE  Final   Report Status 10/01/2015 FINAL  Final  Culture, respiratory (NON-Expectorated)     Status: None   Collection Time: 10/01/15 11:51 AM  Result Value Ref Range Status   Specimen Description EXPECTORATED SPUTUM  Final   Special Requests Normal Reflexed from [id number]  Final   Gram Stain   Final    GOOD SPECIMEN - 80-90% WBCS MODERATE WBC SEEN FEW YEAST    Culture   Final    LIGHT GROWTH CANDIDA ALBICANS LIGHT GROWTH CANDIDA DUBLINIENSIS    Report Status 10/06/2015 FINAL  Final  Culture, expectorated sputum-assessment     Status: None   Collection Time: 10/08/15  3:30 PM  Result Value Ref Range Status   Specimen Description  TRACHEAL ASPIRATE  Final   Special Requests NONE  Final   Sputum evaluation THIS SPECIMEN IS ACCEPTABLE FOR SPUTUM CULTURE  Final   Report Status 10/08/2015 FINAL  Final  Culture, respiratory (NON-Expectorated)     Status: None   Collection Time: 10/08/15  3:30 PM  Result Value Ref Range Status   Specimen Description TRACHEAL ASPIRATE  Final   Special Requests NONE Reflexed from [id number]  Final   Gram Stain   Final    FAIR SPECIMEN - 70-80% WBCS FEW WBC SEEN RARE GRAM POSITIVE COCCI RARE GRAM NEGATIVE RODS    Culture Consistent with normal respiratory flora.  Final   Report Status 10/11/2015 FINAL  Final  Culture, bal-quantitative     Status: None (Preliminary result)   Collection Time: 10/12/15 12:16 PM  Result Value Ref Range Status   Specimen Description BRONCHIAL ALVEOLAR LAVAGE  Final   Special Requests Immunocompromised  Final   Gram Stain   Final    FEW WBC SEEN FEW GRAM POSITIVE COCCI FAIR SPECIMEN - 70-80% WBCS    Culture HOLDING FOR POSSIBLE PATHOGEN  Final   Report Status PENDING  Incomplete  CULTURE, BLOOD (ROUTINE X 2) w Reflex to PCR ID Panel     Status: None (Preliminary result)   Collection Time: 10/12/15  3:59 PM  Result Value Ref Range Status   Specimen Description BLOOD RIGHT ARM  Final   Special Requests   Final    BOTTLES DRAWN AEROBIC AND ANAEROBIC 7CC AERO 9CC ANA   Culture NO GROWTH 4 DAYS  Final   Report Status PENDING  Incomplete  CULTURE, BLOOD (ROUTINE X 2) w Reflex to PCR ID Panel     Status: None (Preliminary result)   Collection Time: 10/12/15  4:07 PM  Result Value Ref Range Status   Specimen Description BLOOD LEFT HAND  Final   Special Requests   Final    BOTTLES DRAWN AEROBIC AND ANAEROBIC 7CC AERO 9CC ANA   Culture  Setup Time   Final    GRAM POSITIVE COCCI AEROBIC BOTTLE ONLY CRITICAL RESULT CALLED TO, READ BACK BY AND VERIFIED WITH: MATT MCBANE AT 8119 10/14/15 DV    Culture   Final    COAGULASE NEGATIVE STAPHYLOCOCCUS AEROBIC BOTTLE  ONLY Results consistent with contamination.    Report Status PENDING  Incomplete  Blood Culture ID Panel (Reflexed)     Status: Abnormal   Collection Time: 10/12/15  4:07 PM  Result Value Ref Range Status   Enterococcus species NOT DETECTED NOT DETECTED Final   Listeria monocytogenes NOT DETECTED NOT DETECTED Final   Staphylococcus species DETECTED (A) NOT DETECTED Final    Comment: CRITICAL RESULT CALLED TO, READ BACK BY AND VERIFIED WITH: MATT MCBANE AT 1478 10/14/15 DV    Staphylococcus aureus NOT DETECTED NOT DETECTED Final   Streptococcus species NOT DETECTED NOT DETECTED Final   Streptococcus agalactiae NOT DETECTED NOT DETECTED Final   Streptococcus pneumoniae NOT DETECTED NOT DETECTED Final   Streptococcus pyogenes NOT DETECTED NOT DETECTED Final   Acinetobacter baumannii NOT DETECTED NOT DETECTED Final   Enterobacteriaceae species NOT DETECTED NOT DETECTED Final   Enterobacter cloacae complex NOT DETECTED NOT DETECTED Final   Escherichia coli NOT DETECTED NOT DETECTED Final   Klebsiella oxytoca NOT DETECTED NOT DETECTED Final   Klebsiella pneumoniae NOT DETECTED NOT DETECTED Final   Proteus species NOT DETECTED NOT DETECTED Final   Serratia marcescens NOT DETECTED NOT DETECTED Final   Haemophilus influenzae NOT DETECTED NOT DETECTED Final   Neisseria meningitidis NOT DETECTED NOT DETECTED Final   Pseudomonas aeruginosa NOT DETECTED NOT DETECTED Final   Candida albicans NOT DETECTED NOT DETECTED Final   Candida glabrata NOT DETECTED NOT DETECTED Final   Candida krusei NOT DETECTED NOT DETECTED Final   Candida parapsilosis NOT DETECTED NOT DETECTED Final   Candida tropicalis NOT DETECTED NOT DETECTED Final   Carbapenem resistance NOT DETECTED NOT DETECTED Final   Methicillin resistance DETECTED (A) NOT DETECTED Final    Comment: CRITICAL RESULT CALLED TO, READ BACK BY AND VERIFIED WITH: MATT MCBANE AT 2956 10/14/15 DV    Vancomycin resistance NOT DETECTED NOT DETECTED  Final    Medications:  Scheduled:  . ALPRAZolam  0.5 mg Oral TID  . alteplase  2 mg Intracatheter Once  . alteplase  2 mg Intracatheter Once  . amiodarone  200 mg Per Tube BID  . antiseptic oral rinse  7 mL Mouth Rinse 10 times per day  . aspirin  325 mg Oral Daily  . budesonide  0.25 mg Nebulization 4 times per day  . chlorhexidine gluconate  15 mL Mouth Rinse BID  . feeding supplement (PRO-STAT SUGAR FREE 64)  30 mL Per Tube TID  . free water  30 mL Per Tube 6 times per day  . Influenza vac split quadrivalent PF  0.5 mL Intramuscular Tomorrow-1000  . ipratropium-albuterol  3 mL Nebulization Q6H  . lactulose  10 g Oral Daily  . meropenem (MERREM) IV  1 g Intravenous Q12H  . nystatin   Topical BID  . pantoprazole sodium  40 mg  Per Tube BID  . senna-docusate  1 tablet Oral BID  . vancomycin  1,500 mg Intravenous Q24H   Infusions:  . argatroban 0.125 mcg/kg/min (10/16/15 0700)  . feeding supplement (VITAL HIGH PROTEIN) 1,000 mL (10/16/15 0740)  . norepinephrine (LEVOPHED) Adult infusion 13.973 mcg/min (10/16/15 0700)  . pureflow 2,500 mL/hr at 10/16/15 0351    Assessment: Pharmacy consulted for medication management for 40 yo male critical care patient requiring CRRT and mechanical ventilation.   Plan:  1. Patient previously ordered cefepime, started 1/24. ID transitioned to meropenem on 1/26. Will continue patient on meropenem 1g IV Q12hr.    2. Patient started on vancomycin  IV Q24hr while on CRRT. Patient's CRRT access in tenuous. Will continue to check random levels prior to dosing. 01/27 random level is 18 mcg/ml. Will continue with current vancomycin dosing and check another random level tomorrow.   3. No further renal adjustments warranted at this time.    4. Continue senna/docusate 1 tab BID. Will add lactulose 10 g daily.   5. No electrolyte replacement warranted at this time. Will recheck with am labs.  Pharmacy will continue to monitor and adjust per  consult.     Roque Cash, PharmD Pharmacy Resident 10/16/2015

## 2015-10-16 NOTE — Progress Notes (Signed)
Per Dr. Arsenio Loader with [facility]- May use PICC line.

## 2015-10-16 NOTE — Progress Notes (Signed)
Spoke to Ronny Bacon- she stated that patient is not positive for staph aureus but for staph species.  Therefore patient does not need to be on contact precautions.  Relayed this information to Dr. Dema Severin.

## 2015-10-16 NOTE — Progress Notes (Signed)
PHARMACY - CRITICAL CARE PROGRESS NOTE  Pharmacy Consult for Meropenem, Vancomycin, CRRT medication management, Constipation Prevention, Electrolyte Management      No Known Allergies  Patient Measurements: Height:  (175.3 cm) Weight: (!) 610 lb 3.7 oz (276.8 kg) IBW/kg (Calculated) : 70.7   Vital Signs: Temp: 99.7 F (37.6 C) (01/28 1500) BP: 92/66 mmHg (01/28 1500) Pulse Rate: 114 (01/28 1500) Intake/Output from previous day: 01/27 0701 - 01/28 0700 In: 1885 [I.V.:375; NG/GT:810; IV Piggyback:700] Out: 1126 [Urine:8] Intake/Output from this shift: Total I/O In: 290.7 [I.V.:130.7; NG/GT:60; IV Piggyback:100] Out: 337 [Other:337] Vent settings for last 24 hours: Vent Mode:  [-] PRVC FiO2 (%):  [70 %] 70 % Set Rate:  [30 bmp] 30 bmp Vt Set:  [550 mL] 550 mL PEEP:  [8 cmH20] 8 cmH20  Labs:  Recent Labs  10/14/15 0400  10/15/15 0408  10/15/15 1215 10/15/15 1306 10/15/15 1600  10/16/15 0140 10/16/15 0652 10/16/15 1006 10/16/15 1447  WBC 16.8*  --  18.0*  --   --   --   --   --  21.4*  --   --   --   HGB 9.2*  --  9.5*  --   --   --   --   --  10.0*  --   --   --   HCT 31.2*  --  31.3*  --   --   --   --   --  32.7*  --   --   --   PLT 59*  --  67*  --   --   --   --   --  82*  --   --   --   APTT  --   --   --   --   --  44*  --   < > 156* 137* 144* 114*  INR  --   --   --   --   --  1.98  --   --   --   --   --   --   CREATININE 2.66*  < > 2.06*  < >  --   --  1.98*  --  1.56*  --  1.53*  --   MG 2.0  --  1.9  --  1.8  --   --   --  1.9  --   --   --   PHOS 3.2  < > UNABLE TO REPORT DUE TO ICTERUS  --   --   --  UNABLE TO REPORT DUE TO ICTERUS  --  UNABLE TO REPORT DUE TO ICTERUS  --  unable to report due to icterus  --   ALBUMIN 2.5*  < > 2.4*  < > 2.3*  --  2.5*  --  2.3*  --  2.2*  --   PROT  --   --   --   --  6.5  --   --   --  6.5  --   --   --   AST  --   --   --   --  2266*  --   --   --  2273*  --   --   --   ALT  --   --   --   --  1115*  --   --    --  1310*  --   --   --   ALKPHOS  --   --   --   --  134*  --   --   --  164*  --   --   --   BILITOT  --   --   --   --  16.0*  --   --   --  19.1*  --   --   --   BILIDIR  --   --   --   --  13.5*  --   --   --   --   --   --   --   IBILI  --   --   --   --  2.5*  --   --   --   --   --   --   --   < > = values in this interval not displayed. Estimated Creatinine Clearance: 137.6 mL/min (by C-G formula based on Cr of 1.53).   Recent Labs  10/15/15 2343 10/16/15 0346 10/16/15 0816  GLUCAP 102* 119* 107*    Microbiology: Recent Results (from the past 720 hour(s))  Blood culture (routine x 2)     Status: None   Collection Time: 10-07-15 11:53 PM  Result Value Ref Range Status   Specimen Description BLOOD LEFT HAND  Final   Special Requests BOTTLES DRAWN AEROBIC AND ANAEROBIC 4CC  Final   Culture NO GROWTH 5 DAYS  Final   Report Status 09/23/2015 FINAL  Final  Blood culture (routine x 2)     Status: None   Collection Time: 2015-10-07 11:53 PM  Result Value Ref Range Status   Specimen Description BLOOD LEFT ARM  Final   Special Requests BOTTLES DRAWN AEROBIC AND ANAEROBIC 5CC  Final   Culture NO GROWTH 5 DAYS  Final   Report Status 09/23/2015 FINAL  Final  Urine culture     Status: None   Collection Time: 09/18/15 12:21 AM  Result Value Ref Range Status   Specimen Description URINE, RANDOM  Final   Special Requests NONE  Final   Culture MULTIPLE SPECIES PRESENT, SUGGEST RECOLLECTION  Final   Report Status 09/19/2015 FINAL  Final  Rapid Influenza A&B Antigens ([facility] only)     Status: None   Collection Time: 09/18/15  6:26 AM  Result Value Ref Range Status   Influenza A ([facility]) NOT DETECTED  Final   Influenza B ([facility]) NOT DETECTED  Final  MRSA PCR Screening     Status: None   Collection Time: 09/20/15  6:30 AM  Result Value Ref Range Status   MRSA by PCR NEGATIVE NEGATIVE Final    Comment:        The GeneXpert MRSA Assay (FDA approved for NASAL specimens only), is one  component of a comprehensive MRSA colonization surveillance program. It is not intended to diagnose MRSA infection nor to guide or monitor treatment for MRSA infections.   Culture, respiratory (NON-Expectorated)     Status: None   Collection Time: 09/22/15  3:13 PM  Result Value Ref Range Status   Specimen Description TRACHEAL ASPIRATE  Final   Special Requests NONE  Final   Gram Stain   Final    GOOD SPECIMEN - 80-90% WBCS MODERATE WBC SEEN RARE YEAST RARE GRAM NEGATIVE COCCOBACILLI    Culture LIGHT GROWTH CANDIDA TROPICALIS  Final   Report Status 09/26/2015 FINAL  Final  Urine culture     Status: None   Collection Time: 09/22/15 11:16 PM  Result Value Ref Range Status   Specimen Description URINE, RANDOM  Final   Special Requests NONE  Final   Culture NO GROWTH 2 DAYS  Final   Report Status 09/24/2015 FINAL  Final  CULTURE, BLOOD (ROUTINE X 2) w Reflex to PCR ID Panel     Status: None   Collection Time: 09/22/15 11:44 PM  Result Value Ref Range Status   Specimen Description BLOOD RIGHT ANTECUBITAL  Final   Special Requests BOTTLES DRAWN AEROBIC AND ANAEROBIC  Final   Culture NO GROWTH 5 DAYS  Final   Report Status 09/28/2015 FINAL  Final  CULTURE, BLOOD (ROUTINE X 2) w Reflex to PCR ID Panel     Status: None   Collection Time: 09/22/15 11:56 PM  Result Value Ref Range Status   Specimen Description BLOOD LEFT HAND  Final   Special Requests BOTTLES DRAWN AEROBIC AND ANAEROBIC  Final   Culture NO GROWTH 5 DAYS  Final   Report Status 09/28/2015 FINAL  Final  Culture, respiratory (NON-Expectorated)     Status: None   Collection Time: 09/23/15 12:01 AM  Result Value Ref Range Status   Specimen Description TRACHEAL ASPIRATE  Final   Special Requests NONE  Final   Gram Stain   Final    GOOD SPECIMEN - 80-90% WBCS MODERATE WBC SEEN RARE YEAST RARE GRAM POSITIVE COCCI    Culture Consistent with normal respiratory flora.  Final   Report Status 09/25/2015 FINAL   Final  Urine culture     Status: None   Collection Time: 10/01/15 10:35 AM  Result Value Ref Range Status   Specimen Description URINE, RANDOM  Final   Special Requests NONE  Final   Culture NO GROWTH 2 DAYS  Final   Report Status 10/03/2015 FINAL  Final  CULTURE, BLOOD (ROUTINE X 2) w Reflex to PCR ID Panel     Status: None   Collection Time: 10/01/15 10:56 AM  Result Value Ref Range Status   Specimen Description BLOOD RIGHT ARM  Final   Special Requests   Final    BOTTLES DRAWN AEROBIC AND ANAEROBIC  AER 3CC ANA 5CC   Culture NO GROWTH 5 DAYS  Final   Report Status 10/06/2015 FINAL  Final  CULTURE, BLOOD (ROUTINE X 2) w Reflex to PCR ID Panel     Status: None   Collection Time: 10/01/15 11:14 AM  Result Value Ref Range Status   Specimen Description BLOOD LEFT HAND  Final   Special Requests   Final    BOTTLES DRAWN AEROBIC AND ANAEROBIC  AER 3CC ANA 4CC   Culture  Setup Time   Final    GRAM POSITIVE COCCI ANAEROBIC BOTTLE ONLY CRITICAL RESULT CALLED TO, READ BACK BY AND VERIFIED WITH: CRYSTAL SCARPENA AT 0915 ON 10/02/15 CTJ    Culture   Final    COAGULASE NEGATIVE STAPHYLOCOCCUS ANAEROBIC BOTTLE ONLY Results consistent with contamination.    Report Status 10/06/2015 FINAL  Final  Blood Culture ID Panel (Reflexed)     Status: Abnormal   Collection Time: 10/01/15 11:14 AM  Result Value Ref Range Status   Enterococcus species NOT DETECTED NOT DETECTED Final   Listeria monocytogenes NOT DETECTED NOT DETECTED Final   Staphylococcus species DETECTED (A) NOT DETECTED Final   Staphylococcus aureus NOT DETECTED NOT DETECTED Final   Streptococcus species NOT DETECTED NOT DETECTED Final   Streptococcus agalactiae NOT DETECTED NOT DETECTED Final   Streptococcus pneumoniae NOT DETECTED NOT DETECTED Final   Streptococcus pyogenes NOT DETECTED NOT DETECTED Final   Acinetobacter baumannii NOT DETECTED NOT DETECTED Final  Enterobacteriaceae species NOT DETECTED NOT DETECTED Final    Enterobacter cloacae complex NOT DETECTED NOT DETECTED Final   Escherichia coli NOT DETECTED NOT DETECTED Final   Klebsiella oxytoca NOT DETECTED NOT DETECTED Final   Klebsiella pneumoniae NOT DETECTED NOT DETECTED Final   Proteus species NOT DETECTED NOT DETECTED Final   Serratia marcescens NOT DETECTED NOT DETECTED Final   Haemophilus influenzae NOT DETECTED NOT DETECTED Final   Neisseria meningitidis NOT DETECTED NOT DETECTED Final   Pseudomonas aeruginosa NOT DETECTED NOT DETECTED Final   Candida albicans NOT DETECTED NOT DETECTED Final   Candida glabrata NOT DETECTED NOT DETECTED Final   Candida krusei NOT DETECTED NOT DETECTED Final   Candida parapsilosis NOT DETECTED NOT DETECTED Final   Candida tropicalis NOT DETECTED NOT DETECTED Final   Carbapenem resistance NOT DETECTED NOT DETECTED Final   Methicillin resistance DETECTED (A) NOT DETECTED Final    Comment: CRITICAL RESULT CALLED TO, READ BACK BY AND VERIFIED WITH: CRYSTAL SCARPENA FOR STAPHYLOCOCCUS SPECIES AND METHICILLIN RESISTANCE AT 0915 ON 10/02/15 CTJ    Vancomycin resistance NOT DETECTED NOT DETECTED Final  Culture, expectorated sputum-assessment     Status: None   Collection Time: 10/01/15 11:51 AM  Result Value Ref Range Status   Specimen Description EXPECTORATED SPUTUM  Final   Special Requests Normal  Final   Sputum evaluation THIS SPECIMEN IS ACCEPTABLE FOR SPUTUM CULTURE  Final   Report Status 10/01/2015 FINAL  Final  Culture, respiratory (NON-Expectorated)     Status: None   Collection Time: 10/01/15 11:51 AM  Result Value Ref Range Status   Specimen Description EXPECTORATED SPUTUM  Final   Special Requests Normal Reflexed from [id number]  Final   Gram Stain   Final    GOOD SPECIMEN - 80-90% WBCS MODERATE WBC SEEN FEW YEAST    Culture   Final    LIGHT GROWTH CANDIDA ALBICANS LIGHT GROWTH CANDIDA DUBLINIENSIS    Report Status 10/06/2015 FINAL  Final  Culture, expectorated sputum-assessment     Status: None    Collection Time: 10/08/15  3:30 PM  Result Value Ref Range Status   Specimen Description TRACHEAL ASPIRATE  Final   Special Requests NONE  Final   Sputum evaluation THIS SPECIMEN IS ACCEPTABLE FOR SPUTUM CULTURE  Final   Report Status 10/08/2015 FINAL  Final  Culture, respiratory (NON-Expectorated)     Status: None   Collection Time: 10/08/15  3:30 PM  Result Value Ref Range Status   Specimen Description TRACHEAL ASPIRATE  Final   Special Requests NONE Reflexed from [id number]  Final   Gram Stain   Final    FAIR SPECIMEN - 70-80% WBCS FEW WBC SEEN RARE GRAM POSITIVE COCCI RARE GRAM NEGATIVE RODS    Culture Consistent with normal respiratory flora.  Final   Report Status 10/11/2015 FINAL  Final  Culture, bal-quantitative     Status: None (Preliminary result)   Collection Time: 10/12/15 12:16 PM  Result Value Ref Range Status   Specimen Description BRONCHIAL ALVEOLAR LAVAGE  Final   Special Requests Immunocompromised  Final   Gram Stain   Final    FEW WBC SEEN FEW GRAM POSITIVE COCCI FAIR SPECIMEN - 70-80% WBCS    Culture   Final    RARE GROWTH YEAST IDENTIFICATION TO FOLLOW ONCE ISOLATED    Report Status PENDING  Incomplete  CULTURE, BLOOD (ROUTINE X 2) w Reflex to PCR ID Panel     Status: None (Preliminary result)   Collection Time: 10/12/15  3:59  PM  Result Value Ref Range Status   Specimen Description BLOOD RIGHT ARM  Final   Special Requests   Final    BOTTLES DRAWN AEROBIC AND ANAEROBIC 7CC AERO 9CC ANA   Culture NO GROWTH 4 DAYS  Final   Report Status PENDING  Incomplete  CULTURE, BLOOD (ROUTINE X 2) w Reflex to PCR ID Panel     Status: None (Preliminary result)   Collection Time: 10/12/15  4:07 PM  Result Value Ref Range Status   Specimen Description BLOOD LEFT HAND  Final   Special Requests   Final    BOTTLES DRAWN AEROBIC AND ANAEROBIC 7CC AERO 9CC ANA   Culture  Setup Time   Final    GRAM POSITIVE COCCI AEROBIC BOTTLE ONLY CRITICAL RESULT CALLED TO, READ BACK  BY AND VERIFIED WITH: MATT MCBANE AT 1610 10/14/15 DV    Culture   Final    COAGULASE NEGATIVE STAPHYLOCOCCUS AEROBIC BOTTLE ONLY Results consistent with contamination.    Report Status PENDING  Incomplete  Blood Culture ID Panel (Reflexed)     Status: Abnormal   Collection Time: 10/12/15  4:07 PM  Result Value Ref Range Status   Enterococcus species NOT DETECTED NOT DETECTED Final   Listeria monocytogenes NOT DETECTED NOT DETECTED Final   Staphylococcus species DETECTED (A) NOT DETECTED Final    Comment: CRITICAL RESULT CALLED TO, READ BACK BY AND VERIFIED WITH: MATT MCBANE AT 9604 10/14/15 DV    Staphylococcus aureus NOT DETECTED NOT DETECTED Final   Streptococcus species NOT DETECTED NOT DETECTED Final   Streptococcus agalactiae NOT DETECTED NOT DETECTED Final   Streptococcus pneumoniae NOT DETECTED NOT DETECTED Final   Streptococcus pyogenes NOT DETECTED NOT DETECTED Final   Acinetobacter baumannii NOT DETECTED NOT DETECTED Final   Enterobacteriaceae species NOT DETECTED NOT DETECTED Final   Enterobacter cloacae complex NOT DETECTED NOT DETECTED Final   Escherichia coli NOT DETECTED NOT DETECTED Final   Klebsiella oxytoca NOT DETECTED NOT DETECTED Final   Klebsiella pneumoniae NOT DETECTED NOT DETECTED Final   Proteus species NOT DETECTED NOT DETECTED Final   Serratia marcescens NOT DETECTED NOT DETECTED Final   Haemophilus influenzae NOT DETECTED NOT DETECTED Final   Neisseria meningitidis NOT DETECTED NOT DETECTED Final   Pseudomonas aeruginosa NOT DETECTED NOT DETECTED Final   Candida albicans NOT DETECTED NOT DETECTED Final   Candida glabrata NOT DETECTED NOT DETECTED Final   Candida krusei NOT DETECTED NOT DETECTED Final   Candida parapsilosis NOT DETECTED NOT DETECTED Final   Candida tropicalis NOT DETECTED NOT DETECTED Final   Carbapenem resistance NOT DETECTED NOT DETECTED Final   Methicillin resistance DETECTED (A) NOT DETECTED Final    Comment: CRITICAL RESULT  CALLED TO, READ BACK BY AND VERIFIED WITH: MATT MCBANE AT 5409 10/14/15 DV    Vancomycin resistance NOT DETECTED NOT DETECTED Final    Medications:  Scheduled:  . ALPRAZolam  0.5 mg Oral TID  . alteplase  2 mg Intracatheter Once  . alteplase  2 mg Intracatheter Once  . amiodarone  200 mg Per Tube BID  . antiseptic oral rinse  7 mL Mouth Rinse 10 times per day  . aspirin  325 mg Oral Daily  . budesonide  0.25 mg Nebulization 4 times per day  . chlorhexidine gluconate  15 mL Mouth Rinse BID  . feeding supplement (PRO-STAT SUGAR FREE 64)  30 mL Per Tube TID  . free water  30 mL Per Tube 6 times per day  .  Influenza vac split quadrivalent PF  0.5 mL Intramuscular Tomorrow-1000  . ipratropium-albuterol  3 mL Nebulization Q6H  . lactulose  10 g Oral BID  . meropenem (MERREM) IV  1 g Intravenous Q12H  . nystatin   Topical BID  . pantoprazole sodium  40 mg Per Tube BID  . senna-docusate  1 tablet Oral BID  . vancomycin  1,500 mg Intravenous Q24H   Infusions:  . argatroban 0.125 mcg/kg/min (10/16/15 1122)  . feeding supplement (VITAL HIGH PROTEIN) 1,000 mL (10/16/15 0740)  . norepinephrine (LEVOPHED) Adult infusion 15 mcg/min (10/16/15 1532)  . pureflow 2,500 mL/hr at 10/16/15 1427    Assessment: Pharmacy consulted for medication management for 40 yo male critical care patient requiring CRRT and mechanical ventilation.   Plan:  1. Patient previously ordered cefepime, started 1/24. ID transitioned to meropenem on 1/26. Will continue patient on meropenem 1g IV Q12hr.    2. Patient started on vancomycin 1500mg  IV Q24hr while on CRRT.  01/28  random level is 16 mcg/ml. Will continue with current vancomycin dosing.  3. No further renal adjustments warranted at this time.    4. Continue senna/docusate 1 tab BID. Will add lactulose 10 g daily.   5. No electrolyte replacement warranted at this time. Will recheck with am labs.  Pharmacy will continue to monitor and adjust per consult.      Olene Floss, Pharm.D Clinical Pharmacist   10/16/2015

## 2015-10-16 NOTE — Progress Notes (Signed)
Notified Dr. Belia Heman of patient's elevated liver enzymes- pt on lactulose  daily- asked if he wanted ammonia level or change lactulose dosage- no new orders.  Explained to Dr. Belia Heman why [facility] was unable to place PICC line in yesterday-patient's arms are too big-also if we were to stop pressor and switch it to vascular catheter- mostly with damage vas cath and it too will need to be replaced.  Dr. Belia Heman stated that he will get back to me on a decision.

## 2015-10-16 NOTE — Progress Notes (Signed)
ANTICOAGULATION CONSULT NOTE - Initial Consult  Pharmacy Consult for Argatroban Indication: possible PE  No Known Allergies  Patient Measurements: Height:  (175.3 cm) Weight: (!) 618 lb 2.7 oz (280.4 kg) IBW/kg (Calculated) : 70.7    Vital Signs: Temp: 98.4 F (36.9 C) (01/28 0230) Temp Source: Rectal (01/27 1900) BP: 105/69 mmHg (01/28 0230) Pulse Rate: 102 (01/28 0230)  Labs:  Recent Labs  10/14/15 0400  10/15/15 0408 10/15/15 1030 10/15/15 1306 10/15/15 1600 10/15/15 1749 10/15/15 2001 10/16/15 0140  HGB 9.2*  --  9.5*  --   --   --   --   --  10.0*  HCT 31.2*  --  31.3*  --   --   --   --   --  32.7*  PLT 59*  --  67*  --   --   --   --   --  82*  APTT  --   --   --   --  44*  --   --  124* 156*  LABPROT  --   --   --   --  22.4*  --   --   --   --   INR  --   --   --   --  1.98  --   --   --   --   CREATININE 2.66*  < > 2.06* 1.99*  --  1.98*  --   --  1.56*  TROPONINI  --   --   --   --   --   --  0.06*  --  0.07*  < > = values in this interval not displayed.  Estimated Creatinine Clearance: 136.3 mL/min (by C-G formula based on Cr of 1.56).   Medical History: Past Medical History  Diagnosis Date  . Super obesity (HCC)   . Tobacco abuse   . Chronic systolic CHF (congestive heart failure) (HCC)   . Atrial fibrillation (HCC) 09/2015    Medications:  Scheduled:  . ALPRAZolam  0.5 mg Oral TID  . alteplase  2 mg Intracatheter Once  . alteplase  2 mg Intracatheter Once  . amiodarone  200 mg Per Tube BID  . antiseptic oral rinse  7 mL Mouth Rinse 10 times per day  . aspirin  325 mg Oral Daily  . budesonide  0.25 mg Nebulization 4 times per day  . chlorhexidine gluconate  15 mL Mouth Rinse BID  . feeding supplement (PRO-STAT SUGAR FREE 64)  30 mL Per Tube TID  . free water  30 mL Per Tube 6 times per day  . hydrocortisone sod succinate (SOLU-CORTEF) inj  50 mg Intravenous Q6H  . Influenza vac split quadrivalent PF  0.5 mL Intramuscular  Tomorrow-1000  . ipratropium-albuterol  3 mL Nebulization Q6H  . lactulose  10 g Oral Daily  . meropenem (MERREM) IV  1 g Intravenous Q12H  . nystatin   Topical BID  . pantoprazole sodium  40 mg Per Tube BID  . senna-docusate  1 tablet Oral BID  . vancomycin  1,500 mg Intravenous Q24H   Infusions:  . argatroban 0.25 mcg/kg/min (10/15/15 2328)  . feeding supplement (VITAL HIGH PROTEIN) 1,000 mL (10/15/15 0841)  . norepinephrine (LEVOPHED) Adult infusion 17 mcg/min (10/15/15 2327)  . pureflow 2,500 mL/hr at 10/15/15 1605    Assessment: 40 y/o morbidly obese male with respiratory failure. Patient has thrombocytopenia and possible HIT although SRA results of 1 make HIT less likely. There is  some concern for clot, possible PE and patient is not a candidate for mechanical DVT prophylaxis due to size.   Child Pugh Score > or = 7  Goal of Therapy:  aPTT 45-95 seconds Monitor platelets by anticoagulation protocol: Yes   Plan:  Due to Child Pugh score > 6, will need to start argatroban at reduced rate of 0.5 mcg/kg/min. Will check APTT 3 hours after starting infusion.  1/27:  APTT @ 19:00 = 124 Will decrease gtt rate by 0.92mcg/kg/min .  Will recheck aPTT 3 hrs after rate change on 1/28 @ 00:00.   1/28 01:30 aPTT 156. Current argatroban rate 0.25 mcg/kg/min. Recommended rate reduction per protocol is also 0.25 mcg/kg/min so will reduce dose by 50% and recheck in 3 hours.  Arelly Whittenberg S 10/16/2015,3:02 AM

## 2015-10-16 NOTE — Progress Notes (Signed)
Notified Dr. Belia Heman about patient's heart rate ST 128 at this time- updated him on the positive troponins yesterday and patient started on Asprin 325 oral daily.  Patient on amiodarone  BID.  Dr. Belia Heman stated patient ok to have HR in 120's- order to not to give metoprolol- no new orders.  Called [employer] about IJ placement for central line- wating on ETA call back.

## 2015-10-16 NOTE — Progress Notes (Signed)
ANTICOAGULATION CONSULT NOTE - Follow Up Consult  Pharmacy Consult for Argatroban Indication: possible PE  No Known Allergies  Patient Measurements: Height:  (175.3 cm) Weight: (!) 610 lb 3.7 oz (276.8 kg) IBW/kg (Calculated) : 70.7    Vital Signs: Temp: 99 F (37.2 C) (01/28 0900) BP: 98/60 mmHg (01/28 1000) Pulse Rate: 128 (01/28 1000)  Labs:  Recent Labs  10/14/15 0400  10/15/15 0408 10/15/15 1030 10/15/15 1306 10/15/15 1600 10/15/15 1749  10/16/15 0140 10/16/15 0620 10/16/15 0652 10/16/15 1006  HGB 9.2*  --  9.5*  --   --   --   --   --  10.0*  --   --   --   HCT 31.2*  --  31.3*  --   --   --   --   --  32.7*  --   --   --   PLT 59*  --  67*  --   --   --   --   --  82*  --   --   --   APTT  --   --   --   --  44*  --   --   < > 156*  --  137* 144*  LABPROT  --   --   --   --  22.4*  --   --   --   --   --   --   --   INR  --   --   --   --  1.98  --   --   --   --   --   --   --   CREATININE 2.66*  < > 2.06* 1.99*  --  1.98*  --   --  1.56*  --   --   --   TROPONINI  --   --   --   --   --   --  0.06*  --  0.07* 0.05*  --   --   < > = values in this interval not displayed.  Estimated Creatinine Clearance: 134.9 mL/min (by C-G formula based on Cr of 1.56).   Medical History: Past Medical History  Diagnosis Date  . Super obesity (HCC)   . Tobacco abuse   . Chronic systolic CHF (congestive heart failure) (HCC)   . Atrial fibrillation (HCC) 09/2015    Medications:  Scheduled:  . ALPRAZolam  0.5 mg Oral TID  . alteplase  2 mg Intracatheter Once  . alteplase  2 mg Intracatheter Once  . amiodarone  200 mg Per Tube BID  . antiseptic oral rinse  7 mL Mouth Rinse 10 times per day  . aspirin  325 mg Oral Daily  . budesonide  0.25 mg Nebulization 4 times per day  . chlorhexidine gluconate  15 mL Mouth Rinse BID  . feeding supplement (PRO-STAT SUGAR FREE 64)  30 mL Per Tube TID  . free water  30 mL Per Tube 6 times per day  . Influenza vac split  quadrivalent PF  0.5 mL Intramuscular Tomorrow-1000  . ipratropium-albuterol  3 mL Nebulization Q6H  . lactulose  10 g Oral BID  . meropenem (MERREM) IV  1 g Intravenous Q12H  . nystatin   Topical BID  . pantoprazole sodium  40 mg Per Tube BID  . senna-docusate  1 tablet Oral BID  . vancomycin  1,500 mg Intravenous Q24H   Infusions:  . argatroban    . feeding  supplement (VITAL HIGH PROTEIN) 1,000 mL (10/16/15 0740)  . norepinephrine (LEVOPHED) Adult infusion 13.973 mcg/min (10/16/15 0700)  . pureflow 2,500 mL/hr at 10/16/15 1035    Assessment: 40 y/o morbidly obese male with respiratory failure. Patient has thrombocytopenia and possible HIT although SRA results of 1 make HIT less likely. There is some concern for clot, possible PE and patient is not a candidate for mechanical DVT prophylaxis due to size.   Child Pugh Score > or = 7  Goal of Therapy:  aPTT 45-95 seconds Monitor platelets by anticoagulation protocol: Yes   Plan:  Due to Child Pugh score > 6, started argatroban at reduced rate of 0.5 mcg/kg/min. Check APTT 3 hours after starting infusion.  1/27:  APTT @ 19:00 = 124 Will decrease gtt rate by 0.64mcg/kg/min .  Will recheck aPTT 3 hrs after rate change on 1/28 @ 00:00.   1/28 01:30 aPTT 156. Current argatroban rate 0.25 mcg/kg/min. Recommended rate reduction per protocol is also 0.25 mcg/kg/min so will reduce dose by 50% and recheck in 3 hours.  1/28 0730 aPTT 137. Rate already reduced to 0.125 mcg/kg/min by RN. Will recheck aPTT in 3 hours.  01/28 1050 aPTT 144  aPTT continues to increase despite reduced rate. Will hold argatroban infusion for 30 minutes and resume infusion based on ideal body weight instead of adjusted body weight at 0.125 mcg/kg/min. Will recheck aPTT in 3 hours.     Luisa Hart, PharmD Clinical Pharmacist  10/16/2015

## 2015-10-16 NOTE — Progress Notes (Addendum)
ANTICOAGULATION CONSULT NOTE - Follow Up Consult  Pharmacy Consult for Argatroban Indication: possible PE  No Known Allergies  Patient Measurements: Height:  (175.3 cm) Weight: (!) 610 lb 3.7 oz (276.8 kg) IBW/kg (Calculated) : 70.7    Vital Signs: Temp: 98.8 F (37.1 C) (01/28 0700) BP: 96/63 mmHg (01/28 0700) Pulse Rate: 102 (01/28 0700)  Labs:  Recent Labs  10/14/15 0400  10/15/15 0408 10/15/15 1030  10/15/15 1306 10/15/15 1600 10/15/15 1749 10/15/15 2001 10/16/15 0140 10/16/15 0620 10/16/15 0652  HGB 9.2*  --  9.5*  --   --   --   --   --   --  10.0*  --   --   HCT 31.2*  --  31.3*  --   --   --   --   --   --  32.7*  --   --   PLT 59*  --  67*  --   --   --   --   --   --  82*  --   --   APTT  --   --   --   --   < > 44*  --   --  124* 156*  --  137*  LABPROT  --   --   --   --   --  22.4*  --   --   --   --   --   --   INR  --   --   --   --   --  1.98  --   --   --   --   --   --   CREATININE 2.66*  < > 2.06* 1.99*  --   --  1.98*  --   --  1.56*  --   --   TROPONINI  --   --   --   --   --   --   --  0.06*  --  0.07* 0.05*  --   < > = values in this interval not displayed.  Estimated Creatinine Clearance: 134.9 mL/min (by C-G formula based on Cr of 1.56).   Medical History: Past Medical History  Diagnosis Date  . Super obesity (HCC)   . Tobacco abuse   . Chronic systolic CHF (congestive heart failure) (HCC)   . Atrial fibrillation (HCC) 09/2015    Medications:  Scheduled:  . ALPRAZolam  0.5 mg Oral TID  . alteplase  2 mg Intracatheter Once  . alteplase  2 mg Intracatheter Once  . amiodarone  200 mg Per Tube BID  . antiseptic oral rinse  7 mL Mouth Rinse 10 times per day  . aspirin  325 mg Oral Daily  . budesonide  0.25 mg Nebulization 4 times per day  . chlorhexidine gluconate  15 mL Mouth Rinse BID  . feeding supplement (PRO-STAT SUGAR FREE 64)  30 mL Per Tube TID  . free water  30 mL Per Tube 6 times per day  . Influenza vac split  quadrivalent PF  0.5 mL Intramuscular Tomorrow-1000  . ipratropium-albuterol  3 mL Nebulization Q6H  . lactulose  10 g Oral Daily  . meropenem (MERREM) IV  1 g Intravenous Q12H  . nystatin   Topical BID  . pantoprazole sodium  40 mg Per Tube BID  . senna-docusate  1 tablet Oral BID  . vancomycin  1,500 mg Intravenous Q24H   Infusions:  . argatroban 0.125 mcg/kg/min (10/16/15 0700)  .  feeding supplement (VITAL HIGH PROTEIN) 1,000 mL (10/15/15 0841)  . norepinephrine (LEVOPHED) Adult infusion 13.973 mcg/min (10/16/15 0700)  . pureflow 2,500 mL/hr at 10/16/15 0351    Assessment: 40 y/o morbidly obese male with respiratory failure. Patient has thrombocytopenia and possible HIT although SRA results of 1 make HIT less likely. There is some concern for clot, possible PE and patient is not a candidate for mechanical DVT prophylaxis due to size.   Child Pugh Score > or = 7  Goal of Therapy:  aPTT 45-95 seconds Monitor platelets by anticoagulation protocol: Yes   Plan:  Due to Child Pugh score > 6, started argatroban at reduced rate of 0.5 mcg/kg/min. Check APTT 3 hours after starting infusion.  1/27:  APTT @ 19:00 = 124 Will decrease gtt rate by 0.43mcg/kg/min .  Will recheck aPTT 3 hrs after rate change on 1/28 @ 00:00.   1/28 01:30 aPTT 156. Current argatroban rate 0.25 mcg/kg/min. Recommended rate reduction per protocol is also 0.25 mcg/kg/min so will reduce dose by 50% and recheck in 3 hours.  1/28 0730 aPTT 137. Rate already reduced to 0.125 mcg/kg/min by RN. Will recheck aPTT in 3 hours.   Roque Cash, PharmD Pharmacy Resident 10/16/2015

## 2015-10-16 NOTE — Progress Notes (Signed)
°   10/16/15 1500  Clinical Encounter Type  Visited With Patient and family together  Visit Type Follow-up  Referral From Nurse  Consult/Referral To Chaplain  Spiritual Encounters  Spiritual Needs Prayer  Chaplain responded to page, offered prayer and presence. Chaplain [employer] Ext [phone]

## 2015-10-16 NOTE — Progress Notes (Signed)
ANTICOAGULATION CONSULT NOTE - Follow Up Consult  Pharmacy Consult for Argatroban Indication: possible PE  No Known Allergies  Patient Measurements: Height:  (175.3 cm) Weight: (!) 610 lb 3.7 oz (276.8 kg) IBW/kg (Calculated) : 70.7    Vital Signs: Temp: 99.1 F (37.3 C) (01/28 1100) BP: 93/59 mmHg (01/28 1400) Pulse Rate: 114 (01/28 1400)  Labs:  Recent Labs  10/14/15 0400  10/15/15 0408  10/15/15 1306 10/15/15 1600 10/15/15 1749  10/16/15 0140 10/16/15 0620 10/16/15 0652 10/16/15 1006 10/16/15 1447  HGB 9.2*  --  9.5*  --   --   --   --   --  10.0*  --   --   --   --   HCT 31.2*  --  31.3*  --   --   --   --   --  32.7*  --   --   --   --   PLT 59*  --  67*  --   --   --   --   --  82*  --   --   --   --   APTT  --   --   --   --  44*  --   --   < > 156*  --  137* 144* 114*  LABPROT  --   --   --   --  22.4*  --   --   --   --   --   --   --   --   INR  --   --   --   --  1.98  --   --   --   --   --   --   --   --   CREATININE 2.66*  < > 2.06*  < >  --  1.98*  --   --  1.56*  --   --  1.53*  --   TROPONINI  --   --   --   --   --   --  0.06*  --  0.07* 0.05*  --   --   --   < > = values in this interval not displayed.  Estimated Creatinine Clearance: 137.6 mL/min (by C-G formula based on Cr of 1.53).   Medical History: Past Medical History  Diagnosis Date  . Super obesity (HCC)   . Tobacco abuse   . Chronic systolic CHF (congestive heart failure) (HCC)   . Atrial fibrillation (HCC) 10/22/15    Medications:  Scheduled:  . ALPRAZolam  0.5 mg Oral TID  . alteplase  2 mg Intracatheter Once  . alteplase  2 mg Intracatheter Once  . amiodarone  200 mg Per Tube BID  . antiseptic oral rinse  7 mL Mouth Rinse 10 times per day  . aspirin  325 mg Oral Daily  . budesonide  0.25 mg Nebulization 4 times per day  . chlorhexidine gluconate  15 mL Mouth Rinse BID  . feeding supplement (PRO-STAT SUGAR FREE 64)  30 mL Per Tube TID  . free water  30 mL Per Tube 6  times per day  . Influenza vac split quadrivalent PF  0.5 mL Intramuscular Tomorrow-1000  . ipratropium-albuterol  3 mL Nebulization Q6H  . lactulose  10 g Oral BID  . meropenem (MERREM) IV  1 g Intravenous Q12H  . nystatin   Topical BID  . pantoprazole sodium  40 mg Per Tube BID  . senna-docusate  1 tablet Oral BID  . vancomycin  1,500 mg Intravenous Q24H   Infusions:  . argatroban 0.125 mcg/kg/min (10/16/15 1122)  . feeding supplement (VITAL HIGH PROTEIN) 1,000 mL (10/16/15 0740)  . norepinephrine (LEVOPHED) Adult infusion 13.973 mcg/min (10/16/15 0700)  . pureflow 2,500 mL/hr at 10/16/15 1427    Assessment: 40 y/o morbidly obese male with respiratory failure. Patient has thrombocytopenia and possible HIT although SRA results of 1 make HIT less likely. There is some concern for clot, possible PE and patient is not a candidate for mechanical DVT prophylaxis due to size.   Child Pugh Score > or = 7  Goal of Therapy:  aPTT 45-95 seconds Monitor platelets by anticoagulation protocol: Yes   Plan:  Due to Child Pugh score > 6, started argatroban at reduced rate of 0.5 mcg/kg/min. Check APTT 3 hours after starting infusion.  1/27:  APTT @ 19:00 = 124 Will decrease gtt rate by 0.32mcg/kg/min .  Will recheck aPTT 3 hrs after rate change on 1/28 @ 00:00.   1/28 01:30 aPTT 156. Current argatroban rate 0.25 mcg/kg/min. Recommended rate reduction per protocol is also 0.25 mcg/kg/min so will reduce dose by 50% and recheck in 3 hours.  1/28 0730 aPTT 137. Rate already reduced to 0.125 mcg/kg/min by RN. Will recheck aPTT in 3 hours.  01/28 1050 aPTT 144  aPTT continues to increase despite reduced rate. Will hold argatroban infusion for 30 minutes and resume infusion based on ideal body weight instead of adjusted body weight at 0.125 mcg/kg/min. Will recheck aPTT in 3 hours.    01/28 1525 aPTT 114 aPTT is coming down but is still above goal. Rate is at 0.125 mcg/kg/min based on IBW so may not  be able to go much lower. Will continue current rate for now and check another aPTT in 3 hours.    Luisa Hart, PharmD Clinical Pharmacist  10/16/2015

## 2015-10-16 NOTE — Progress Notes (Signed)
ANTICOAGULATION CONSULT NOTE - Follow Up Consult  Pharmacy Consult for Argatroban Indication: possible PE  No Known Allergies  Patient Measurements: Height:  (175.3 cm) Weight: (!) 610 lb 3.7 oz (276.8 kg) IBW/kg (Calculated) : 70.7    Vital Signs: Temp: 97.2 F (36.2 C) (01/28 1600) BP: 86/56 mmHg (01/28 1800) Pulse Rate: 126 (01/28 1800)  Labs:  Recent Labs  10/14/15 0400  10/15/15 0408  10/15/15 1306  10/15/15 1749  10/16/15 0140 10/16/15 0620  10/16/15 1006 10/16/15 1447 10/16/15 1625 10/16/15 1748  HGB 9.2*  --  9.5*  --   --   --   --   --  10.0*  --   --   --   --   --   --   HCT 31.2*  --  31.3*  --   --   --   --   --  32.7*  --   --   --   --   --   --   PLT 59*  --  67*  --   --   --   --   --  82*  --   --   --   --   --   --   APTT  --   --   --   --  44*  --   --   < > 156*  --   < > 144* 114*  --  143*  LABPROT  --   --   --   --  22.4*  --   --   --   --   --   --   --   --   --   --   INR  --   --   --   --  1.98  --   --   --   --   --   --   --   --   --   --   CREATININE 2.66*  < > 2.06*  < >  --   < >  --   --  1.56*  --   --  1.53*  --  1.47*  --   TROPONINI  --   --   --   --   --   --  0.06*  --  0.07* 0.05*  --   --   --   --   --   < > = values in this interval not displayed.  Estimated Creatinine Clearance: 143.2 mL/min (by C-G formula based on Cr of 1.47).   Medical History: Past Medical History  Diagnosis Date  . Super obesity (HCC)   . Tobacco abuse   . Chronic systolic CHF (congestive heart failure) (HCC)   . Atrial fibrillation (HCC) 09/2015    Medications:  Scheduled:  . ALPRAZolam  0.5 mg Oral TID  . alteplase  2 mg Intracatheter Once  . alteplase  2 mg Intracatheter Once  . amiodarone  200 mg Per Tube BID  . antiseptic oral rinse  7 mL Mouth Rinse 10 times per day  . aspirin  325 mg Oral Daily  . budesonide  0.25 mg Nebulization 4 times per day  . chlorhexidine gluconate  15 mL Mouth Rinse BID  . feeding  supplement (PRO-STAT SUGAR FREE 64)  30 mL Per Tube TID  . free water  30 mL Per Tube 6 times per day  . Influenza vac split quadrivalent PF  0.5  mL Intramuscular Tomorrow-1000  . ipratropium-albuterol  3 mL Nebulization Q6H  . lactulose  10 g Oral BID  . meropenem (MERREM) IV  1 g Intravenous Q12H  . nystatin   Topical BID  . pantoprazole sodium  40 mg Per Tube BID  . senna-docusate  1 tablet Oral BID  . vancomycin  1,500 mg Intravenous Q24H   Infusions:  . argatroban 0.0625 mcg/kg/min (10/16/15 1814)  . feeding supplement (VITAL HIGH PROTEIN) 1,000 mL (10/16/15 0740)  . norepinephrine (LEVOPHED) Adult infusion 16 mcg/min (10/16/15 1620)  . pureflow 2,500 mL/hr at 10/16/15 1427    Assessment: 40 y/o morbidly obese male with respiratory failure. Patient has thrombocytopenia and possible HIT although SRA results of 1 make HIT less likely. There is some concern for clot, possible PE and patient is not a candidate for mechanical DVT prophylaxis due to size.   Child Pugh Score > or = 7  Goal of Therapy:  aPTT 45-95 seconds Monitor platelets by anticoagulation protocol: Yes   Plan:  Due to Child Pugh score > 6, started argatroban at reduced rate of 0.5 mcg/kg/min. Check APTT 3 hours after starting infusion.  1/27:  APTT @ 19:00 = 124 Will decrease gtt rate by 0.2mcg/kg/min .  Will recheck aPTT 3 hrs after rate change on 1/28 @ 00:00.   1/28 01:30 aPTT 156. Current argatroban rate 0.25 mcg/kg/min. Recommended rate reduction per protocol is also 0.25 mcg/kg/min so will reduce dose by 50% and recheck in 3 hours.  1/28 0730 aPTT 137. Rate already reduced to 0.125 mcg/kg/min by RN. Will recheck aPTT in 3 hours.  01/28 1050 aPTT 144  aPTT continues to increase despite reduced rate. Will hold argatroban infusion for 30 minutes and resume infusion based on ideal body weight instead of adjusted body weight at 0.125 mcg/kg/min. Will recheck aPTT in 3 hours.    01/28 1525 aPTT 114 aPTT is  coming down but is still above goal. Rate is at 0.125 mcg/kg/min based on IBW so may not be able to go much lower. Will continue current rate for now and check another aPTT in 3 hours.   01/28  1748 aPTT 143  Will hold argatroban infusion for 30 min and then decrease rate to 0.0619mcg/kg/min. Pump unable to run at 0.265 so it will have to run at 0.67ml/hr. Not sure we can run the rate any slower. Will recheck in 3 hours from the drip being turned back on. Discussed this with the RN  Olene Floss, Pharm.D Clinical Pharmacist  10/16/2015

## 2015-10-16 NOTE — Progress Notes (Signed)
PULMONARY/CCM PROGRESS NOTE  40 year old massively obese male with acute CHF, volume overload. Now with acute renal failure, vent dependent respiratory failure status post trach, depressed mentation since tracheostomy.   Day #29 in ICU  Lines, Tubes, etc: ETT 01/02 >> . Patient self extubated, subsequently reintubated 09/25/2015 L IJ CVL 01/02 >>   Microbiology: Blood 12/30 >>  MRSA PCR 01/02 >> NEG Urine 01/04 >> NEG Blood 01/04 >> no growth BAL 1/24>>normal respiratory flora Cefepime 1/24>> Vanc 1/24>>  PCT 01/04: 0.74  Antibiotics:  Levofloxacin 12/30 >> 01/01 Ceftriaxone 01/02 >> 01/02 Azithro 01/02 >> 01/02 Levofloxacin 01/04 >> 01/05 Vanc 01/05 >> 1/9 Ceftaz 01/05 >>1/9  Cefepime 1/25>>1/26 Vanc 1/26>> Meropenem 126>>   PCT 01/04: 0.74, 0.95, 0.77  Studies/Events: 12/31 TTE:  poor quality study, LVEF 45% 12/31 RUQ: Nodular contour the liver raising the question of cirrhosis. Gallbladder wall thickening, nonspecific in appearance 01/04 night - fever, hypotension. Cultures obtained. Abx expanded -failed self extubation 1/24 Bronchoscopy - thick purulent secretions, mainly in the upper airways, mucosal edema and erythema noted, BAL on RML.  1/16 Trach  Best Practice: DVT: enoxaparin SUP: enteral famotidine Nutrition: TFs    Subj: Patient continues to have fevers, only on PRN sedation and agitation meds, still no purposeful movements Remains on vent support and on CRRT, multiorgan failure  Obj: Filed Vitals:   10/16/15 0630 10/16/15 0700 10/16/15 0800 10/16/15 0900  BP: 92/58  Pulse: 102 102 102 103  Temp: 98.8 F (37.1 C) 98.8 F (37.1 C) 99 F (37.2 C) 99 F (37.2 C)  TempSrc:      Resp: Height:      Weight:      SpO2: 96% 97% 95% 96%    Gen: morbidly obese HEENT:  WNL.  Mild blood tinged secretions noted.  Neck: CVL site clean, JVP cannot be assessed Chest: shallow and clear Cardiac: distant HS, regular,  no M noted Abd: obese, soft, + BS Ext: symmetric edema, chronic stasis changes Neuro: GCS<10T  BMET    Component Value Date/Time   NA 137 10/16/2015 0140   K 3.8 10/16/2015 0140   CL 102 10/16/2015 0140   CO2 22 10/16/2015 0140   GLUCOSE 120* 10/16/2015 0140   BUN 66* 10/16/2015 0140   CREATININE 1.56* 10/16/2015 0140   CALCIUM 8.8* 10/16/2015 0140   GFRNONAA 54* 10/16/2015 0140   GFRAA >60 10/16/2015 0140    CBC    Component Value Date/Time   WBC 21.4* 10/16/2015 0140   RBC 3.88* 10/16/2015 0140   HGB 10.0* 10/16/2015 0140   HCT 32.7* 10/16/2015 0140   PLT 82* 10/16/2015 0140   MCV 84.5 10/16/2015 0140   MCH 25.8* 10/16/2015 0140   MCHC 30.6* 10/16/2015 0140   RDW 24.1* 10/16/2015 0140   LYMPHSABS 0.8* 10/07/2015 1116   MONOABS 1.4* 10/07/2015 1116   EOSABS 0.5 10/07/2015 1116   BASOSABS 0.1 10/07/2015 1116      IMPRESSION: -- Acute on chronic hypercarbic respiratory failure , pulmonary edema.  S/P trach tube placement 10-21-2015 -- Obesity hypoventilation syndrome -- Atelectasis -- Cardiomyopathy - LVEF 45% 09/18/15, repeat ECHO 1/24 EF 50% mod RA Dil and elevated RVSP -- Shock - suspect septic -- AKI, oliguric. CRRT initiated 1/18.  R IJ temporary HD catheter 10/06/15 -- Severe hypervolemia  -- Sepsis -- ICU acquired anemia -- New thrombocytopenia - SQ heparin DC'd 01/22 -- ICU associated delirium --Tracheitis -- neurologic depression - since tracheostomy  PLAN/RECS:  Cont full vent support - settings reviewed and/or adjusted -switched to [facility] on 1/24 due to vent dyssynchrony.  Cont vent bundle Daily SBT if/when meets criteria Repeat ECHO performed 1/24, see above,elevated RVSP, dil RV and RA, ? PE, hep stopped due to thrombocytopenia, started on argatroban 1/27 for DVT prophylaxis  Wean norepinephrine to off for MAP > 65 mmHg Monitor BMET intermittently Monitor I/Os Correct electrolytes as indicated CRRT per Renal, stopped on 1/24, did not  tolerated HD well, restarted CRRT with new cartridges on 1/25. Monitor Plts closely, now improving.  DVT px: SCDs Monitor CBC intermittently  Keep eye on plt counts Transfuse per usual ICU guidelines PRN fentanyl and Versed, low dose Xanax Cont SSRI If fevers develop again, add antifungals per ID consult.  Consider changing CVL  Seen by neurology, EEG with good Brain activity.    I have personally obtained a history, examined the patient, evaluated Pertinent laboratory and [employer]/imaging results, and  formulated the assessment and plan   The Patient requires high complexity decision making for assessment and support, frequent evaluation and titration of therapies, application of advanced monitoring technologies and extensive interpretation of multiple databases. Critical Care Time devoted to patient care services described in this note is 35 minutes.   Overall, patient is critically ill, prognosis is guarded.  Patient with Multiorgan failure and at high risk for cardiac arrest and death.    Lucie Leather, M.D.  [facility]  Medical Director [facility] Medical Director [facility] Cardio-Pulmonary Department

## 2015-10-17 LAB — RENAL FUNCTION PANEL
ALBUMIN: 2.1 g/dL — AB (ref 3.5–5.0)
ALBUMIN: 2.1 g/dL — AB (ref 3.5–5.0)
ANION GAP: 11 (ref 5–15)
ANION GAP: 8 (ref 5–15)
ANION GAP: 8 (ref 5–15)
ANION GAP: 10 (ref 5–15)
Albumin: 2.2 g/dL — ABNORMAL LOW (ref 3.5–5.0)
Albumin: 2.2 g/dL — ABNORMAL LOW (ref 3.5–5.0)
BUN: 58 mg/dL — ABNORMAL HIGH (ref 6–20)
BUN: 59 mg/dL — AB (ref 6–20)
BUN: 57 mg/dL — ABNORMAL HIGH (ref 6–20)
BUN: 57 mg/dL — AB (ref 6–20)
CALCIUM: 8.7 mg/dL — AB (ref 8.9–10.3)
CALCIUM: 8.8 mg/dL — AB (ref 8.9–10.3)
CALCIUM: 8.5 mg/dL — AB (ref 8.9–10.3)
CHLORIDE: 102 mmol/L (ref 101–111)
CO2: 25 mmol/L (ref 22–32)
CO2: 28 mmol/L (ref 22–32)
CO2: 27 mmol/L (ref 22–32)
CO2: 26 mmol/L (ref 22–32)
Calcium: 8.4 mg/dL — ABNORMAL LOW (ref 8.9–10.3)
Chloride: 101 mmol/L (ref 101–111)
Chloride: 102 mmol/L (ref 101–111)
Chloride: 102 mmol/L (ref 101–111)
Creatinine, Ser: 1.21 mg/dL (ref 0.61–1.24)
Creatinine, Ser: UNDETERMINED mg/dL (ref 0.61–1.24)
Creatinine, Ser: UNDETERMINED mg/dL (ref 0.61–1.24)
Creatinine, Ser: UNDETERMINED mg/dL (ref 0.61–1.24)
GFR calc non Af Amer: >60 mL/min (ref >60)
GLUCOSE: 126 mg/dL — AB (ref 65–99)
Glucose, Bld: 119 mg/dL — ABNORMAL HIGH (ref 65–99)
Glucose, Bld: 126 mg/dL — ABNORMAL HIGH (ref 65–99)
Glucose, Bld: 130 mg/dL — ABNORMAL HIGH (ref 65–99)
PHOSPHORUS: UNDETERMINED mg/dL (ref 2.5–4.6)
PHOSPHORUS: UNDETERMINED mg/dL (ref 2.5–4.6)
POTASSIUM: 3.4 mmol/L — AB (ref 3.5–5.1)
Phosphorus: UNDETERMINED mg/dL (ref 2.5–4.6)
Phosphorus: UNDETERMINED mg/dL (ref 2.5–4.6)
Potassium: 3.6 mmol/L (ref 3.5–5.1)
Potassium: 3.7 mmol/L (ref 3.5–5.1)
Potassium: 3.4 mmol/L — ABNORMAL LOW (ref 3.5–5.1)
SODIUM: 137 mmol/L (ref 135–145)
SODIUM: 138 mmol/L (ref 135–145)
SODIUM: 138 mmol/L (ref 135–145)
Sodium: 137 mmol/L (ref 135–145)

## 2015-10-17 LAB — CBC
HEMATOCRIT: 32 % — AB (ref 40.0–52.0)
Hemoglobin: 9.7 g/dL — ABNORMAL LOW (ref 13.0–18.0)
MCH: 26.1 pg (ref 26.0–34.0)
MCHC: 30.3 g/dL — AB (ref 32.0–36.0)
MCV: 86.2 fL (ref 80.0–100.0)
Platelets: 97 10*3/uL — ABNORMAL LOW (ref 150–440)
RBC: 3.72 MIL/uL — ABNORMAL LOW (ref 4.40–5.90)
RDW: 23.9 % — AB (ref 11.5–14.5)
WBC: 26.3 10*3/uL — ABNORMAL HIGH (ref 3.8–10.6)

## 2015-10-17 LAB — CULTURE, BLOOD (ROUTINE X 2): Culture: NO GROWTH

## 2015-10-17 LAB — GLUCOSE, CAPILLARY
GLUCOSE-CAPILLARY: 105 mg/dL — AB (ref 65–99)
Glucose-Capillary: 112 mg/dL — ABNORMAL HIGH (ref 65–99)
Glucose-Capillary: 109 mg/dL — ABNORMAL HIGH (ref 65–99)
Glucose-Capillary: 112 mg/dL — ABNORMAL HIGH (ref 65–99)

## 2015-10-17 LAB — HEPATIC FUNCTION PANEL
ALT: 1173 U/L — AB (ref 17–63)
AST: 1152 U/L — AB (ref 15–41)
Albumin: 2.1 g/dL — ABNORMAL LOW (ref 3.5–5.0)
Alkaline Phosphatase: 155 U/L — ABNORMAL HIGH (ref 38–126)
BILIRUBIN DIRECT: 16.2 mg/dL — AB (ref 0.1–0.5)
BILIRUBIN TOTAL: 23.4 mg/dL — AB (ref 0.3–1.2)
Indirect Bilirubin: 7.2 mg/dL — ABNORMAL HIGH (ref 0.3–0.9)
Total Protein: 6.2 g/dL — ABNORMAL LOW (ref 6.5–8.1)

## 2015-10-17 LAB — CULTURE, BAL-QUANTITATIVE

## 2015-10-17 LAB — APTT: APTT: 134 s — AB (ref 24–36)

## 2015-10-17 LAB — CULTURE, BAL-QUANTITATIVE W GRAM STAIN

## 2015-10-17 LAB — MAGNESIUM: Magnesium: 1.9 mg/dL (ref 1.7–2.4)

## 2015-10-17 MED ORDER — LACTULOSE 10 GM/15ML PO SOLN
30.0000 g | Freq: Two times a day (BID) | ORAL | Status: DC
  Administered 2015-10-17 – 2015-10-18 (×3): 30 g
  Filled 2015-10-17 (×3): qty 60

## 2015-10-17 NOTE — Progress Notes (Signed)
CRRT stopped for cartridge change and restarted at this time. It was end of cartridge life.

## 2015-10-17 NOTE — Progress Notes (Addendum)
PHARMACY - CRITICAL CARE PROGRESS NOTE  Pharmacy Consult for Meropenem, Vancomycin, CRRT medication management, Constipation Prevention, Electrolyte Management      No Known Allergies  Patient Measurements: Height:  (175.3 cm) Weight: (!) 610 lb 3.7 oz (276.8 kg) IBW/kg (Calculated) : 70.7   Vital Signs: Temp: 99 F (37.2 C) (01/29 0000) Temp Source: Oral (01/29 0000) BP: 93/41 mmHg (01/29 0330) Pulse Rate: 116 (01/29 0330) Intake/Output from previous day: 01/28 0701 - 01/29 0700 In: 2008.2 [I.V.:258.2; NG/GT:1050; IV Piggyback:700] Out: 972 [Urine:10] Intake/Output from this shift: Total I/O In: 479.3 [I.V.:79.3; NG/GT:300; IV Piggyback:100] Out: 386 [Other:386] Vent settings for last 24 hours: Vent Mode:  [-] PRVC FiO2 (%):  [70 %] 70 % Set Rate:  [30 bmp] 30 bmp Vt Set:  [550 mL] 550 mL PEEP:  [8 cmH20] 8 cmH20  Labs:  Recent Labs  10/15/15 0408  10/15/15 1215 10/15/15 1306  10/16/15 0140  10/16/15 1625 10/16/15 1748 10/16/15 2220 10/17/15 0415  WBC 18.0*  --   --   --   --  21.4*  --   --   --   --   --   HGB 9.5*  --   --   --   --  10.0*  --   --   --   --   --   HCT 31.3*  --   --   --   --  32.7*  --   --   --   --   --   PLT 67*  --   --   --   --  82*  --   --   --   --   --   APTT  --   --   --  44*  < > 156*  < >  --  143* 128* 134*  INR  --   --   --  1.98  --   --   --   --   --   --   --   CREATININE 2.06*  < >  --   --   < > 1.56*  < > 1.47*  --  1.31* 1.21  MG 1.9  --  1.8  --   --  1.9  --   --   --   --  1.9  PHOS UNABLE TO REPORT DUE TO ICTERUS  --   --   --   < > UNABLE TO REPORT DUE TO ICTERUS  < > UNABLE TO REPORT DUE TO ICTERUS  --  UNABLE TO REPORT DUE TO ICTERUS UNABLE TO REPORT DUE TO ICTERUS  ALBUMIN 2.4*  < > 2.3*  --   < > 2.3*  < > 2.1*  --  2.2* 2.2*  PROT  --   --  6.5  --   --  6.5  --   --   --   --   --   AST  --   --  2266*  --   --  2273*  --   --   --   --   --   ALT  --   --  1115*  --   --  1310*  --   --   --    --   --   ALKPHOS  --   --  134*  --   --  164*  --   --   --   --   --  BILITOT  --   --  16.0*  --   --  19.1*  --   --   --   --   --   BILIDIR  --   --  13.5*  --   --   --   --   --   --   --   --   IBILI  --   --  2.5*  --   --   --   --   --   --   --   --   < > = values in this interval not displayed. Estimated Creatinine Clearance: 174 mL/min (by C-G formula based on Cr of 1.21).   Recent Labs  10/16/15 1934 10/16/15 2337 10/17/15 0355  GLUCAP 90 107* 112*    Microbiology: Recent Results (from the past 720 hour(s))  Blood culture (routine x 2)     Status: None   Collection Time: 09/13/2015 11:53 PM  Result Value Ref Range Status   Specimen Description BLOOD LEFT HAND  Final   Special Requests BOTTLES DRAWN AEROBIC AND ANAEROBIC 4CC  Final   Culture NO GROWTH 5 DAYS  Final   Report Status 09/23/2015 FINAL  Final  Blood culture (routine x 2)     Status: None   Collection Time: 09/05/2015 11:53 PM  Result Value Ref Range Status   Specimen Description BLOOD LEFT ARM  Final   Special Requests BOTTLES DRAWN AEROBIC AND ANAEROBIC 5CC  Final   Culture NO GROWTH 5 DAYS  Final   Report Status 09/23/2015 FINAL  Final  Urine culture     Status: None   Collection Time: 09/18/15 12:21 AM  Result Value Ref Range Status   Specimen Description URINE, RANDOM  Final   Special Requests NONE  Final   Culture MULTIPLE SPECIES PRESENT, SUGGEST RECOLLECTION  Final   Report Status 09/19/2015 FINAL  Final  Rapid Influenza A&B Antigens ([facility] only)     Status: None   Collection Time: 09/18/15  6:26 AM  Result Value Ref Range Status   Influenza A ([facility]) NOT DETECTED  Final   Influenza B ([facility]) NOT DETECTED  Final  MRSA PCR Screening     Status: None   Collection Time: 09/20/15  6:30 AM  Result Value Ref Range Status   MRSA by PCR NEGATIVE NEGATIVE Final    Comment:        The GeneXpert MRSA Assay (FDA approved for NASAL specimens only), is one component of a comprehensive MRSA  colonization surveillance program. It is not intended to diagnose MRSA infection nor to guide or monitor treatment for MRSA infections.   Culture, respiratory (NON-Expectorated)     Status: None   Collection Time: 09/22/15  3:13 PM  Result Value Ref Range Status   Specimen Description TRACHEAL ASPIRATE  Final   Special Requests NONE  Final   Gram Stain   Final    GOOD SPECIMEN - 80-90% WBCS MODERATE WBC SEEN RARE YEAST RARE GRAM NEGATIVE COCCOBACILLI    Culture LIGHT GROWTH CANDIDA TROPICALIS  Final   Report Status 09/26/2015 FINAL  Final  Urine culture     Status: None   Collection Time: 09/22/15 11:16 PM  Result Value Ref Range Status   Specimen Description URINE, RANDOM  Final   Special Requests NONE  Final   Culture NO GROWTH 2 DAYS  Final   Report Status 09/24/2015 FINAL  Final  CULTURE, BLOOD (ROUTINE X 2) w Reflex to PCR ID  Panel     Status: None   Collection Time: 09/22/15 11:44 PM  Result Value Ref Range Status   Specimen Description BLOOD RIGHT ANTECUBITAL  Final   Special Requests BOTTLES DRAWN AEROBIC AND ANAEROBIC  Final   Culture NO GROWTH 5 DAYS  Final   Report Status 09/28/2015 FINAL  Final  CULTURE, BLOOD (ROUTINE X 2) w Reflex to PCR ID Panel     Status: None   Collection Time: 09/22/15 11:56 PM  Result Value Ref Range Status   Specimen Description BLOOD LEFT HAND  Final   Special Requests BOTTLES DRAWN AEROBIC AND ANAEROBIC  Final   Culture NO GROWTH 5 DAYS  Final   Report Status 09/28/2015 FINAL  Final  Culture, respiratory (NON-Expectorated)     Status: None   Collection Time: 09/23/15 12:01 AM  Result Value Ref Range Status   Specimen Description TRACHEAL ASPIRATE  Final   Special Requests NONE  Final   Gram Stain   Final    GOOD SPECIMEN - 80-90% WBCS MODERATE WBC SEEN RARE YEAST RARE GRAM POSITIVE COCCI    Culture Consistent with normal respiratory flora.  Final   Report Status 09/25/2015 FINAL  Final  Urine culture     Status: None    Collection Time: 10/01/15 10:35 AM  Result Value Ref Range Status   Specimen Description URINE, RANDOM  Final   Special Requests NONE  Final   Culture NO GROWTH 2 DAYS  Final   Report Status 10/03/2015 FINAL  Final  CULTURE, BLOOD (ROUTINE X 2) w Reflex to PCR ID Panel     Status: None   Collection Time: 10/01/15 10:56 AM  Result Value Ref Range Status   Specimen Description BLOOD RIGHT ARM  Final   Special Requests   Final    BOTTLES DRAWN AEROBIC AND ANAEROBIC  AER 3CC ANA 5CC   Culture NO GROWTH 5 DAYS  Final   Report Status 10/06/2015 FINAL  Final  CULTURE, BLOOD (ROUTINE X 2) w Reflex to PCR ID Panel     Status: None   Collection Time: 10/01/15 11:14 AM  Result Value Ref Range Status   Specimen Description BLOOD LEFT HAND  Final   Special Requests   Final    BOTTLES DRAWN AEROBIC AND ANAEROBIC  AER 3CC ANA 4CC   Culture  Setup Time   Final    GRAM POSITIVE COCCI ANAEROBIC BOTTLE ONLY CRITICAL RESULT CALLED TO, READ BACK BY AND VERIFIED WITH: CRYSTAL SCARPENA AT 0915 ON 10/02/15 CTJ    Culture   Final    COAGULASE NEGATIVE STAPHYLOCOCCUS ANAEROBIC BOTTLE ONLY Results consistent with contamination.    Report Status 10/06/2015 FINAL  Final  Blood Culture ID Panel (Reflexed)     Status: Abnormal   Collection Time: 10/01/15 11:14 AM  Result Value Ref Range Status   Enterococcus species NOT DETECTED NOT DETECTED Final   Listeria monocytogenes NOT DETECTED NOT DETECTED Final   Staphylococcus species DETECTED (A) NOT DETECTED Final   Staphylococcus aureus NOT DETECTED NOT DETECTED Final   Streptococcus species NOT DETECTED NOT DETECTED Final   Streptococcus agalactiae NOT DETECTED NOT DETECTED Final   Streptococcus pneumoniae NOT DETECTED NOT DETECTED Final   Streptococcus pyogenes NOT DETECTED NOT DETECTED Final   Acinetobacter baumannii NOT DETECTED NOT DETECTED Final   Enterobacteriaceae species NOT DETECTED NOT DETECTED Final   Enterobacter cloacae complex NOT DETECTED  NOT DETECTED Final   Escherichia coli NOT DETECTED NOT DETECTED Final  Klebsiella oxytoca NOT DETECTED NOT DETECTED Final   Klebsiella pneumoniae NOT DETECTED NOT DETECTED Final   Proteus species NOT DETECTED NOT DETECTED Final   Serratia marcescens NOT DETECTED NOT DETECTED Final   Haemophilus influenzae NOT DETECTED NOT DETECTED Final   Neisseria meningitidis NOT DETECTED NOT DETECTED Final   Pseudomonas aeruginosa NOT DETECTED NOT DETECTED Final   Candida albicans NOT DETECTED NOT DETECTED Final   Candida glabrata NOT DETECTED NOT DETECTED Final   Candida krusei NOT DETECTED NOT DETECTED Final   Candida parapsilosis NOT DETECTED NOT DETECTED Final   Candida tropicalis NOT DETECTED NOT DETECTED Final   Carbapenem resistance NOT DETECTED NOT DETECTED Final   Methicillin resistance DETECTED (A) NOT DETECTED Final    Comment: CRITICAL RESULT CALLED TO, READ BACK BY AND VERIFIED WITH: CRYSTAL SCARPENA FOR STAPHYLOCOCCUS SPECIES AND METHICILLIN RESISTANCE AT 0915 ON 10/02/15 CTJ    Vancomycin resistance NOT DETECTED NOT DETECTED Final  Culture, expectorated sputum-assessment     Status: None   Collection Time: 10/01/15 11:51 AM  Result Value Ref Range Status   Specimen Description EXPECTORATED SPUTUM  Final   Special Requests Normal  Final   Sputum evaluation THIS SPECIMEN IS ACCEPTABLE FOR SPUTUM CULTURE  Final   Report Status 10/01/2015 FINAL  Final  Culture, respiratory (NON-Expectorated)     Status: None   Collection Time: 10/01/15 11:51 AM  Result Value Ref Range Status   Specimen Description EXPECTORATED SPUTUM  Final   Special Requests Normal Reflexed from [id number]  Final   Gram Stain   Final    GOOD SPECIMEN - 80-90% WBCS MODERATE WBC SEEN FEW YEAST    Culture   Final    LIGHT GROWTH CANDIDA ALBICANS LIGHT GROWTH CANDIDA DUBLINIENSIS    Report Status 10/06/2015 FINAL  Final  Culture, expectorated sputum-assessment     Status: None   Collection Time: 10/08/15  3:30 PM   Result Value Ref Range Status   Specimen Description TRACHEAL ASPIRATE  Final   Special Requests NONE  Final   Sputum evaluation THIS SPECIMEN IS ACCEPTABLE FOR SPUTUM CULTURE  Final   Report Status 10/08/2015 FINAL  Final  Culture, respiratory (NON-Expectorated)     Status: None   Collection Time: 10/08/15  3:30 PM  Result Value Ref Range Status   Specimen Description TRACHEAL ASPIRATE  Final   Special Requests NONE Reflexed from [id number]  Final   Gram Stain   Final    FAIR SPECIMEN - 70-80% WBCS FEW WBC SEEN RARE GRAM POSITIVE COCCI RARE GRAM NEGATIVE RODS    Culture Consistent with normal respiratory flora.  Final   Report Status 10/11/2015 FINAL  Final  Culture, bal-quantitative     Status: None (Preliminary result)   Collection Time: 10/12/15 12:16 PM  Result Value Ref Range Status   Specimen Description BRONCHIAL ALVEOLAR LAVAGE  Final   Special Requests Immunocompromised  Final   Gram Stain   Final    FEW WBC SEEN FEW GRAM POSITIVE COCCI FAIR SPECIMEN - 70-80% WBCS    Culture   Final    RARE GROWTH YEAST IDENTIFICATION TO FOLLOW ONCE ISOLATED    Report Status PENDING  Incomplete  CULTURE, BLOOD (ROUTINE X 2) w Reflex to PCR ID Panel     Status: None (Preliminary result)   Collection Time: 10/12/15  3:59 PM  Result Value Ref Range Status   Specimen Description BLOOD RIGHT ARM  Final   Special Requests   Final    BOTTLES DRAWN AEROBIC  AND ANAEROBIC 7CC AERO 9CC ANA   Culture NO GROWTH 4 DAYS  Final   Report Status PENDING  Incomplete  CULTURE, BLOOD (ROUTINE X 2) w Reflex to PCR ID Panel     Status: None (Preliminary result)   Collection Time: 10/12/15  4:07 PM  Result Value Ref Range Status   Specimen Description BLOOD LEFT HAND  Final   Special Requests   Final    BOTTLES DRAWN AEROBIC AND ANAEROBIC 7CC AERO 9CC ANA   Culture  Setup Time   Final    GRAM POSITIVE COCCI AEROBIC BOTTLE ONLY CRITICAL RESULT CALLED TO, READ BACK BY AND VERIFIED WITH: MATT Genell Thede AT  [id number] 10/14/15 DV    Culture   Final    COAGULASE NEGATIVE STAPHYLOCOCCUS AEROBIC BOTTLE ONLY Results consistent with contamination.    Report Status PENDING  Incomplete  Blood Culture ID Panel (Reflexed)     Status: Abnormal   Collection Time: 10/12/15  4:07 PM  Result Value Ref Range Status   Enterococcus species NOT DETECTED NOT DETECTED Final   Listeria monocytogenes NOT DETECTED NOT DETECTED Final   Staphylococcus species DETECTED (A) NOT DETECTED Final    Comment: CRITICAL RESULT CALLED TO, READ BACK BY AND VERIFIED WITH: MATT Julie-Anne Torain AT 6962 10/14/15 DV    Staphylococcus aureus NOT DETECTED NOT DETECTED Final   Streptococcus species NOT DETECTED NOT DETECTED Final   Streptococcus agalactiae NOT DETECTED NOT DETECTED Final   Streptococcus pneumoniae NOT DETECTED NOT DETECTED Final   Streptococcus pyogenes NOT DETECTED NOT DETECTED Final   Acinetobacter baumannii NOT DETECTED NOT DETECTED Final   Enterobacteriaceae species NOT DETECTED NOT DETECTED Final   Enterobacter cloacae complex NOT DETECTED NOT DETECTED Final   Escherichia coli NOT DETECTED NOT DETECTED Final   Klebsiella oxytoca NOT DETECTED NOT DETECTED Final   Klebsiella pneumoniae NOT DETECTED NOT DETECTED Final   Proteus species NOT DETECTED NOT DETECTED Final   Serratia marcescens NOT DETECTED NOT DETECTED Final   Haemophilus influenzae NOT DETECTED NOT DETECTED Final   Neisseria meningitidis NOT DETECTED NOT DETECTED Final   Pseudomonas aeruginosa NOT DETECTED NOT DETECTED Final   Candida albicans NOT DETECTED NOT DETECTED Final   Candida glabrata NOT DETECTED NOT DETECTED Final   Candida krusei NOT DETECTED NOT DETECTED Final   Candida parapsilosis NOT DETECTED NOT DETECTED Final   Candida tropicalis NOT DETECTED NOT DETECTED Final   Carbapenem resistance NOT DETECTED NOT DETECTED Final   Methicillin resistance DETECTED (A) NOT DETECTED Final    Comment: CRITICAL RESULT CALLED TO, READ BACK BY AND VERIFIED  WITH: MATT Olajuwon Fosdick AT 9528 10/14/15 DV    Vancomycin resistance NOT DETECTED NOT DETECTED Final    Medications:  Scheduled:  . ALPRAZolam  0.5 mg Oral TID  . alteplase  2 mg Intracatheter Once  . alteplase  2 mg Intracatheter Once  . amiodarone  200 mg Per Tube BID  . antiseptic oral rinse  7 mL Mouth Rinse 10 times per day  . aspirin  325 mg Oral Daily  . budesonide  0.25 mg Nebulization 4 times per day  . chlorhexidine gluconate  15 mL Mouth Rinse BID  . feeding supplement (PRO-STAT SUGAR FREE 64)  30 mL Per Tube TID  . free water  30 mL Per Tube 6 times per day  . Influenza vac split quadrivalent PF  0.5 mL Intramuscular Tomorrow-1000  . ipratropium-albuterol  3 mL Nebulization Q6H  . lactulose  10 g Oral BID  . meropenem (  MERREM) IV  1 g Intravenous Q12H  . nystatin   Topical BID  . pantoprazole sodium  40 mg Per Tube BID  . senna-docusate  1 tablet Oral BID  . vancomycin  1,500 mg Intravenous Q24H   Infusions:  . argatroban 0.0625 mcg/kg/min (10/16/15 1900)  . feeding supplement (VITAL HIGH PROTEIN) 1,000 mL (10/16/15 0740)  . norepinephrine (LEVOPHED) Adult infusion 20 mcg/min (10/16/15 2300)  . pureflow 2,500 mL/hr at 10/16/15 1427    Assessment: Pharmacy consulted for medication management for 40 yo male critical care patient requiring CRRT and mechanical ventilation.   Plan:  1. Patient previously ordered cefepime, started 1/24. ID transitioned to meropenem on 1/26. Will continue patient on meropenem 1g IV Q12hr.    2. Patient started on vancomycin  IV Q24hr while on CRRT.  01/28  random level is 16 mcg/ml. Will continue with current vancomycin dosing.  3. No further renal adjustments warranted at this time.    4. Continue senna/docusate 1 tab BID. Will add lactulose 10 g daily.   5. No electrolyte replacement warranted at this time. Will recheck with am labs.  1/29 AM electrolyte panel WNL. Recheck BMP in AM.  Pharmacy will continue to monitor and  adjust per consult.     Olene Floss, Pharm.D Clinical Pharmacist   10/17/2015

## 2015-10-17 NOTE — Progress Notes (Signed)
PULMONARY/CCM PROGRESS NOTE  40 year old massively obese male with acute CHF, volume overload. Now with acute renal failure, vent dependent respiratory failure status post trach, depressed mentation since tracheostomy. Now developing liver failure.  Day #29 in ICU  Lines, Tubes, etc: ETT 01/02 >> . Patient self extubated, subsequently reintubated 09/25/2015 L IJ CVL 01/02 >>   Microbiology: Blood 12/30 >>  MRSA PCR 01/02 >> NEG Urine 01/04 >> NEG Blood 01/04 >> no growth BAL 1/24>>normal respiratory flora Cefepime 1/24>> Vanc 1/24>>  PCT 01/04: 0.74  Antibiotics:  Levofloxacin 12/30 >> 01/01 Ceftriaxone 01/02 >> 01/02 Azithro 01/02 >> 01/02 Levofloxacin 01/04 >> 01/05 Vanc 01/05 >> 1/9 Ceftaz 01/05 >>1/9  Cefepime 1/25>>1/26 Vanc 1/26>> Meropenem 126>>   PCT 01/04: 0.74, 0.95, 0.77  Studies/Events: 12/31 TTE:  poor quality study, LVEF 45% 12/31 RUQ: Nodular contour the liver raising the question of cirrhosis. Gallbladder wall thickening, nonspecific in appearance 01/04 night - fever, hypotension. Cultures obtained. Abx expanded -failed self extubation 1/24 Bronchoscopy - thick purulent secretions, mainly in the upper airways, mucosal edema and erythema noted, BAL on RML.  1/16 Trach  Best Practice: DVT: enoxaparin SUP: enteral famotidine Nutrition: TFs    Subj: Patient continues to have fevers, only on PRN sedation and agitation meds, still no purposeful movements Remains on vent support and on CRRT, multiorgan failure  Obj: Filed Vitals:   10/17/15 1200 10/17/15 1300 10/17/15 1400 10/17/15 1500  BP: 90/48  Pulse: 115 114 114 114  Temp: 99.6 F (37.6 C)     TempSrc: Oral     Resp: 21 16 0 0  Height:      Weight:      SpO2: 90% 91% 90% 92%    Gen: morbidly obese HEENT:  WNL.  Mild blood tinged secretions noted.  Neck: CVL site clean, JVP cannot be assessed Chest: shallow and clear Cardiac: distant HS, regular, no M noted Abd:  obese, soft, + BS Ext: symmetric edema, chronic stasis changes Neuro: GCS<10T  BMET    Component Value Date/Time   NA 138 10/17/2015 1027   K 3.7 10/17/2015 1027   CL 102 10/17/2015 1027   CO2 28 10/17/2015 1027   GLUCOSE 126* 10/17/2015 1027   BUN 59* 10/17/2015 1027   CREATININE UNABLE TO REPORT DUE TO ICTERUS 10/17/2015 1027   CALCIUM 8.8* 10/17/2015 1027   GFRNONAA NOT CALCULATED 10/17/2015 1027   GFRAA NOT CALCULATED 10/17/2015 1027    CBC    Component Value Date/Time   WBC 26.3* 10/17/2015 0415   RBC 3.72* 10/17/2015 0415   HGB 9.7* 10/17/2015 0415   HCT 32.0* 10/17/2015 0415   PLT 97* 10/17/2015 0415   MCV 86.2 10/17/2015 0415   MCH 26.1 10/17/2015 0415   MCHC 30.3* 10/17/2015 0415   RDW 23.9* 10/17/2015 0415   LYMPHSABS 0.8* 10/07/2015 1116   MONOABS 1.4* 10/07/2015 1116   EOSABS 0.5 10/07/2015 1116   BASOSABS 0.1 10/07/2015 1116      IMPRESSION: -- Acute on chronic hypercarbic respiratory failure , pulmonary edema.  S/P trach tube placement 10/13/2015 -- Liver failure, with acute hepatitis, and jaundice. We will reconsult GI, though the patient cannot have a CAT scan and uncertain what ultrasound will be able to image, given his body habitus. -- Obesity hypoventilation syndrome -- Atelectasis -- Cardiomyopathy - LVEF 45% 09/18/15, repeat ECHO 1/24 EF 50% mod RA Dil and elevated RVSP -- Shock - suspect septic -- AKI, oliguric. CRRT initiated 1/18.  R IJ temporary  HD catheter 10/06/15 -- Severe hypervolemia  -- Sepsis -- ICU acquired anemia -- New thrombocytopenia - SQ heparin DC'd 01/22 -- ICU associated delirium --Tracheitis -- neurologic depression - since tracheostomy  PLAN/RECS:  Cont full vent support - settings reviewed and/or adjusted -switched to [facility] on 1/24 due to vent dyssynchrony.  Cont vent bundle Daily SBT if/when meets criteria Repeat ECHO performed 1/24, see above,elevated RVSP, dil RV and RA, ? PE, hep stopped due to  thrombocytopenia, started on argatroban 1/27 for DVT prophylaxis  Wean norepinephrine to off for MAP > 65 mmHg Monitor BMET intermittently Monitor I/Os Correct electrolytes as indicated CRRT per Renal, stopped on 1/24, did not tolerated HD well, restarted CRRT with new cartridges on 1/25. Monitor Plts closely, now improving.  DVT px: SCDs Monitor CBC intermittently  Keep eye on plt counts Transfuse per usual ICU guidelines PRN fentanyl and Versed, low dose Xanax Cont SSRI If fevers develop again, add antifungals per ID consult.  Consider changing CVL  Seen by neurology, EEG with good Brain activity.    Wells Guiles M.D.  Critical Care Attestation.  I have personally obtained a history, examined the patient, evaluated laboratory and imaging results, formulated the assessment and plan and placed orders. The Patient requires high complexity decision making for assessment and support, frequent evaluation and titration of therapies, application of advanced monitoring technologies and extensive interpretation of multiple databases. The patient has critical illness that could lead imminently to failure of 1 or more organ systems and requires the highest level of physician preparedness to intervene.  Critical Care Time devoted to patient care services described in this note is 35 minutes and is exclusive of time spent in procedures.

## 2015-10-17 NOTE — Progress Notes (Signed)
[name] [place] NAME: Mitchell Morrow    MR#:  [id number]  DATE OF BIRTH:  [birth year]  SUBJECTIVE:  Tolerating CRRT but remains anuric/oluguric.  O2 requirements still quite high at 70%.  Remains on IV levophed with multi-organ failure and critically ill.   Was made DNR yesterday.   REVIEW OF SYSTEMS:   Review of Systems  Unable to perform ROS: intubated   DRUG ALLERGIES:  No Known Allergies  VITALS:  Blood pressure 89/56, pulse 114, temperature 99.6 F (37.6 C), temperature source Oral, resp. rate 16, height _0  (1.753 m), weight 276.8 kg (610 lb 3.7 oz), SpO2 91 %.  PHYSICAL EXAMINATION:   Physical Exam  Constitutional: He is well-developed, well-nourished, and in no distress. No distress.  Morbidly obese  HENT:  Head: Normocephalic.  Eyes: No scleral icterus.  Neck: Neck supple. No JVD present. No tracheal deviation present.  Tracheostomy placed  Cardiovascular: Normal rate, regular rhythm and normal heart sounds.  Exam reveals no gallop and no friction rub.   No murmur heard. Pulmonary/Chest: Effort normal and breath sounds normal. No respiratory distress. He has no wheezes. He has no rales. He exhibits no tenderness.  Abdominal: Soft. Bowel sounds are normal. He exhibits no distension and no mass. There is no tenderness. There is no rebound and no guarding.  Musculoskeletal: Normal range of motion. He exhibits edema.  Neurological:  Eyes open but patient does not follow commands  Skin: Skin is warm. No rash noted. No erythema.       LABORATORY PANEL:   CBC  Recent Labs Lab 10/17/15 0415  WBC 26.3*  HGB 9.7*  HCT 32.0*  PLT 97*   ------------------------------------------------------------------------------------------------------------------  Chemistries   Recent Labs Lab 10/17/15 0415 10/17/15 1027  NA 137 138  K 3.6 3.7  CL 101 102  CO2 25 28  GLUCOSE 119* 126*  BUN 58* 59*   CREATININE 1.21 UNABLE TO REPORT DUE TO ICTERUS  CALCIUM 8.7* 8.8*  MG 1.9  --   AST  --  1152*  ALT  --  1173*  ALKPHOS  --  155*  BILITOT  --  23.4*    RADIOLOGY:  Dg Chest Port 1 View  10/16/2015  CLINICAL DATA:  PICC line placement. EXAM: PORTABLE CHEST 1 VIEW COMPARISON:  10/16/2015, earlier the same day. FINDINGS: 1509 hours. Low volume film. Patient rotated to the left. X-ray is under penetrated. The right PICC line has been repositioned in the interval no longer courses cranially in the neck. The tip of the right PICC line is not well visualized and may be positioned the level of the proximal SVC, but superimposition of cardiomediastinal tissues makes precise tip localization difficult. The right IJ catheter remains in place. Left IJ catheter is again no. Tracheostomy tube again noted. There is an NG tube visualized although it becomes obscure over the mediastinum. Lung volumes are low. There is vascular congestion with probable associated pulmonary edema and left base collapse/ consolidation. Telemetry leads overlie the chest. IMPRESSION: Right PICC line has been repositioned in the interval and is now directed centrally although the tip cannot be confidently visualized secondary to obscuration by cardiomediastinal tissues. Otherwise stable exam. Electronically Signed   By: Misty Stanley M.D.   On: 10/16/2015 15:28   Dg Chest Port 1 View  10/16/2015  CLINICAL DATA:  40 year old male with PICC line placement. Respiratory failure. EXAM: PORTABLE CHEST 1 VIEW COMPARISON:  10/14/2015 and prior exams  FINDINGS: A right PICC line is identified extending cephalad in the neck with tip overlying the mid -upper right neck. Tracheostomy tube, bilateral IJ central venous catheters and NG tube again noted. Cardiomegaly with pulmonary vascular congestion, pulmonary edema and left lower lung consolidation/atelectasis again noted. There is no evidence of pneumothorax. IMPRESSION: Right PICC line extending  cephalad into the neck with tip overlying the mid -upper right neck. Recommend repositioning. Otherwise no significant change. Cardiomegaly, pulmonary vascular congestion, edema and left lower lung consolidation/atelectasis. This was discussed with Charli, R.N., at 3:05 p.m. on 10/16/2015. Electronically Signed   By: Margarette Canada M.D.   On: 10/16/2015 15:07    ASSESSMENT AND PLAN:   40 year old male with a history of morbid obesity and tobacco abuse who presented with lower extremity edema and found to have bilateral pneumonia.  1. Acute hypoxic hypercapnic respiratory failure-:  -multifactorial  due to obesity hypoventilation syndrome, community-acquired pneumonia and pulmonary edema, ARDS.   - Patient remains Intubated and s/p Tracheostomy done on 10/12/2015. Continue vent management as per pulmonary and very difficult to wean. - Patient had completed course of antibiotics for treatment of pneumonia with vancomycin and CEFTAZ. - spiked fever on 1/25 and cont. Vanc and meropenem day 3.   - BC (PCR) from 10/12/15 Positive for MRSA  2. Septic Shock - due to pneumonia presumably.   - cont. IV Vanco, Meropenem.  - cont. IV Levophed. Keep MAP > 60.    3.New onset systolic congestive heart failure,  EF 45% :  - Continue CRRT  4. Acute renal failure - Due to ATN and septic shock.  Due to persistent oliguria and hemodynamic instability patient was started on  CRRT on 10/06/15 and tolerating it for now.  Hemodialysis was attempted however patient did not tolerate this well    5. Nutrition-Currently has an NG tube with tube feeds. Needs a PEG tube but due to his large body habitus,GI cannot do it endoscopically and as per IR team, patient will be too heavy for a CT table and also the fluoroscopic table due to his weight. So cannot be done by interventional radiology. Surgery services also say they cannot place the PEG tube. Patient will need to have an open procedure for his PEG tube placement by  surgical team, however will be a high risk procedure considering his body habitus and also his other multiple medical problems. - we have tried contacting tertiary care centers at [place] but they have refused to do it due to his comorbidities and poor functional status  6. Hyperbilirubinemia:due to chronic passive congestion from congestive heart failure. Bili's getting worse.  - hepatitis panel (-) and cannot do any diagnostic tests due to his morbid obesity.  - follow LFT's/    7. Atrial fibrillation - known history of paroxysmal atrial fibrillation. Cont. Amio.  - high Risk of long-term full dose anticoagulation due to thrombocytopenia.   8. Anemia of chronic disease and illness: Continue to monitor hemoglobin. - No indication for blood transfusion  9. 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  - cont. To avoid heparin. Cont. Argatroban.  Follow Plt. Count.   Dr. Mortimer Fries met with family yesterday and had discussion with them about his poor prognosis and outcome. He was mad DNR yesterday.  Family is leaning towards comfort care if he dose not improve in the next 24-48 hrs.    CODE STATUS: DNR  TOTAL CRITICAL CARE TIME SPENT IN TAKING CARE OF THIS PATIENT:  30 minutes.     Henreitta Leber M.D on 10/17/2015   Between 7am to 6pm - Pager - [phone]  After 6pm go to [email or web address] - password [email or web address] [facility]  [facility]  Office  [phone]  CC: Primary care physician; No PCP Per Patient  Note: This dictation was prepared with Dragon dictation along with smaller phrase technology. Any transcriptional errors that result from this process are unintentional.

## 2015-10-17 NOTE — Progress Notes (Signed)
Dr. Nicholos Johns notified of critical total bilirubin: 23.4. Acknowledged. No new orders. Vandana Haman E 1:33 PM 10/17/2015

## 2015-10-17 NOTE — Progress Notes (Signed)
GI Inpatient Follow-up Note  Patient Identification: Mitchell Morrow is a 40 y.o. male with morbid obesity, MOF, now with elevated liver enzymes.   Subjective:  I was called this evening regarding elev liver enzymes. T.bili increased to 23.  AST, ALT down slighly from yesterday.  Prev called to place PEG which could not be one based on his body habitus.   Scheduled Inpatient Medications:  . ALPRAZolam  0.5 mg Oral TID  . alteplase  2 mg Intracatheter Once  . alteplase  2 mg Intracatheter Once  . amiodarone  200 mg Per Tube BID  . antiseptic oral rinse  7 mL Mouth Rinse 10 times per day  . aspirin  325 mg Oral Daily  . budesonide  0.25 mg Nebulization 4 times per day  . chlorhexidine gluconate  15 mL Mouth Rinse BID  . feeding supplement (PRO-STAT SUGAR FREE 64)  30 mL Per Tube TID  . free water  30 mL Per Tube 6 times per day  . Influenza vac split quadrivalent PF  0.5 mL Intramuscular Tomorrow-1000  . ipratropium-albuterol  3 mL Nebulization Q6H  . lactulose  30 g Per Tube BID  . meropenem (MERREM) IV  1 g Intravenous Q12H  . nystatin   Topical BID  . pantoprazole sodium  40 mg Per Tube BID  . senna-docusate  1 tablet Oral BID  . vancomycin  1,500 mg Intravenous Q24H    Continuous Inpatient Infusions:   . argatroban 0.0625 mcg/kg/min (10/17/15 0700)  . feeding supplement (VITAL HIGH PROTEIN) 1,000 mL (10/17/15 0700)  . norepinephrine (LEVOPHED) Adult infusion 25 mcg/min (10/17/15 1133)  . pureflow 2,500 mL/hr at 10/17/15 1344    PRN Inpatient Medications:  acetaminophen **OR** acetaminophen, albuterol, fentaNYL (SUBLIMAZE) injection, metoprolol, midazolam, ondansetron **OR** ondansetron (ZOFRAN) IV, sodium chloride  Review of Systems: Cannot obtain   Physical Examination: BP 81/50 mmHg  Pulse 113  Temp(Src) 98.9 F (37.2 C) (Oral)  Resp 32  Ht  (1.753 m)  Wt 276.8 kg (610 lb 3.7 oz)  BMI 90.07 kg/m2  SpO2 96% Gen: NAD, alert and oriented x 0, on vent,  massively obese Neck: supple, no JVD or thyromegaly Chest: coarse bilat, ventilator sounds.  CV: RRR, no m/g/c/r Abd: soft, NT, ND, +BS in all four quadrants; no HSM, guarding, ridigity, or rebound tenderness, massively obese abd Ext:  Edema bilaterally, well perfused with 2+ pulses, Skin: no rash or lesions noted Lymph: no LAD  Data: Lab Results  Component Value Date   WBC 26.3* 10/17/2015   HGB 9.7* 10/17/2015   HCT 32.0* 10/17/2015   MCV 86.2 10/17/2015   PLT 97* 10/17/2015    Recent Labs Lab 10/15/15 0408 10/16/15 0140 10/17/15 0415  HGB 9.5* 10.0* 9.7*   Lab Results  Component Value Date   NA 137 10/17/2015   K 3.4* 10/17/2015   CL 102 10/17/2015   CO2 27 10/17/2015   BUN 57* 10/17/2015   CREATININE UNABLE TO REPORT DUE TO ICTERUS 10/17/2015   Lab Results  Component Value Date   ALT 1173* 10/17/2015   AST 1152* 10/17/2015   ALKPHOS 155* 10/17/2015   BILITOT 23.4* 10/17/2015    Recent Labs Lab 10/15/15 1306  10/17/15 0415  APTT 44*  < > 134*  INR 1.98  --   --   < > = values in this interval not displayed.   Assessment/Plan: Mr. Renne is a 40 y.o. male with morbid obesity, MOF, now with elevated liver enzymes.  This  is likely secondary to the MOF, likely ischemic hepatopathy or congestive hepatopathy.  Can't tell if this is liver faiure since no INR in several days.   Recommendations: - liver u/s with dopplers - daily INR and liver enzymes - if this is true liver failure(need to see latest INR), would only be able to provide supportive care and will likely be fatal.   Please call with questions or concerns.  REIN, Addison Naegeli, MD

## 2015-10-17 NOTE — Progress Notes (Signed)
PHARMACY - CRITICAL CARE PROGRESS NOTE  Pharmacy Consult for Meropenem, Vancomycin, CRRT medication management, Constipation Prevention, Electrolyte Management      No Known Allergies  Patient Measurements: Height:  (175.3 cm) Weight: (!) 610 lb 3.7 oz (276.8 kg) IBW/kg (Calculated) : 70.7   Vital Signs: Temp: 98.9 F (37.2 C) (01/29 0700) Temp Source: Oral (01/29 0700) BP: 92/57 mmHg (01/29 0700) Pulse Rate: 115 (01/29 0700) Intake/Output from previous day: 01/28 0701 - 01/29 0700 In: 2561.9 [I.V.:391.9; NG/GT:1470; IV Piggyback:700] Out: 1164 [Urine:10] Intake/Output from this shift:   Vent settings for last 24 hours: Vent Mode:  [-] PRVC FiO2 (%):  [70 %] 70 % Set Rate:  [30 bmp] 30 bmp Vt Set:  [550 mL] 550 mL PEEP:  [8 cmH20] 8 cmH20  Labs:  Recent Labs  10/15/15 0408  10/15/15 1215 10/15/15 1306  10/16/15 0140  10/16/15 1625 10/16/15 1748 10/16/15 2220 10/17/15 0415  WBC 18.0*  --   --   --   --  21.4*  --   --   --   --   --   HGB 9.5*  --   --   --   --  10.0*  --   --   --   --   --   HCT 31.3*  --   --   --   --  32.7*  --   --   --   --   --   PLT 67*  --   --   --   --  82*  --   --   --   --   --   APTT  --   --   --  44*  < > 156*  < >  --  143* 128* 134*  INR  --   --   --  1.98  --   --   --   --   --   --   --   CREATININE 2.06*  < >  --   --   < > 1.56*  < > 1.47*  --  1.31* 1.21  MG 1.9  --  1.8  --   --  1.9  --   --   --   --  1.9  PHOS UNABLE TO REPORT DUE TO ICTERUS  --   --   --   < > UNABLE TO REPORT DUE TO ICTERUS  < > UNABLE TO REPORT DUE TO ICTERUS  --  UNABLE TO REPORT DUE TO ICTERUS UNABLE TO REPORT DUE TO ICTERUS  ALBUMIN 2.4*  < > 2.3*  --   < > 2.3*  < > 2.1*  --  2.2* 2.2*  PROT  --   --  6.5  --   --  6.5  --   --   --   --   --   AST  --   --  2266*  --   --  2273*  --   --   --   --   --   ALT  --   --  1115*  --   --  1310*  --   --   --   --   --   ALKPHOS  --   --  134*  --   --  164*  --   --   --   --   --    BILITOT  --   --  16.0*  --   --  19.1*  --   --   --   --   --   BILIDIR  --   --  13.5*  --   --   --   --   --   --   --   --   IBILI  --   --  2.5*  --   --   --   --   --   --   --   --   < > = values in this interval not displayed. Estimated Creatinine Clearance: 174 mL/min (by C-G formula based on Cr of 1.21).   Recent Labs  10/16/15 2337 10/17/15 0355 10/17/15 0740  GLUCAP 107* 112* 105*    Microbiology: Recent Results (from the past 720 hour(s))  Blood culture (routine x 2)     Status: None   Collection Time: 08/26/2015 11:53 PM  Result Value Ref Range Status   Specimen Description BLOOD LEFT HAND  Final   Special Requests BOTTLES DRAWN AEROBIC AND ANAEROBIC 4CC  Final   Culture NO GROWTH 5 DAYS  Final   Report Status 09/23/2015 FINAL  Final  Blood culture (routine x 2)     Status: None   Collection Time: 09/11/2015 11:53 PM  Result Value Ref Range Status   Specimen Description BLOOD LEFT ARM  Final   Special Requests BOTTLES DRAWN AEROBIC AND ANAEROBIC 5CC  Final   Culture NO GROWTH 5 DAYS  Final   Report Status 09/23/2015 FINAL  Final  Urine culture     Status: None   Collection Time: 09/18/15 12:21 AM  Result Value Ref Range Status   Specimen Description URINE, RANDOM  Final   Special Requests NONE  Final   Culture MULTIPLE SPECIES PRESENT, SUGGEST RECOLLECTION  Final   Report Status 09/19/2015 FINAL  Final  Rapid Influenza A&B Antigens ([facility] only)     Status: None   Collection Time: 09/18/15  6:26 AM  Result Value Ref Range Status   Influenza A ([facility]) NOT DETECTED  Final   Influenza B ([facility]) NOT DETECTED  Final  MRSA PCR Screening     Status: None   Collection Time: 09/20/15  6:30 AM  Result Value Ref Range Status   MRSA by PCR NEGATIVE NEGATIVE Final    Comment:        The GeneXpert MRSA Assay (FDA approved for NASAL specimens only), is one component of a comprehensive MRSA colonization surveillance program. It is not intended to diagnose  MRSA infection nor to guide or monitor treatment for MRSA infections.   Culture, respiratory (NON-Expectorated)     Status: None   Collection Time: 09/22/15  3:13 PM  Result Value Ref Range Status   Specimen Description TRACHEAL ASPIRATE  Final   Special Requests NONE  Final   Gram Stain   Final    GOOD SPECIMEN - 80-90% WBCS MODERATE WBC SEEN RARE YEAST RARE GRAM NEGATIVE COCCOBACILLI    Culture LIGHT GROWTH CANDIDA TROPICALIS  Final   Report Status 09/26/2015 FINAL  Final  Urine culture     Status: None   Collection Time: 09/22/15 11:16 PM  Result Value Ref Range Status   Specimen Description URINE, RANDOM  Final   Special Requests NONE  Final   Culture NO GROWTH 2 DAYS  Final   Report Status 09/24/2015 FINAL  Final  CULTURE, BLOOD (ROUTINE X 2) w Reflex to PCR ID Panel     Status: None  Collection Time: 09/22/15 11:44 PM  Result Value Ref Range Status   Specimen Description BLOOD RIGHT ANTECUBITAL  Final   Special Requests BOTTLES DRAWN AEROBIC AND ANAEROBIC  Final   Culture NO GROWTH 5 DAYS  Final   Report Status 09/28/2015 FINAL  Final  CULTURE, BLOOD (ROUTINE X 2) w Reflex to PCR ID Panel     Status: None   Collection Time: 09/22/15 11:56 PM  Result Value Ref Range Status   Specimen Description BLOOD LEFT HAND  Final   Special Requests BOTTLES DRAWN AEROBIC AND ANAEROBIC  Final   Culture NO GROWTH 5 DAYS  Final   Report Status 09/28/2015 FINAL  Final  Culture, respiratory (NON-Expectorated)     Status: None   Collection Time: 09/23/15 12:01 AM  Result Value Ref Range Status   Specimen Description TRACHEAL ASPIRATE  Final   Special Requests NONE  Final   Gram Stain   Final    GOOD SPECIMEN - 80-90% WBCS MODERATE WBC SEEN RARE YEAST RARE GRAM POSITIVE COCCI    Culture Consistent with normal respiratory flora.  Final   Report Status 09/25/2015 FINAL  Final  Urine culture     Status: None   Collection Time: 10/01/15 10:35 AM  Result Value Ref Range  Status   Specimen Description URINE, RANDOM  Final   Special Requests NONE  Final   Culture NO GROWTH 2 DAYS  Final   Report Status 10/03/2015 FINAL  Final  CULTURE, BLOOD (ROUTINE X 2) w Reflex to PCR ID Panel     Status: None   Collection Time: 10/01/15 10:56 AM  Result Value Ref Range Status   Specimen Description BLOOD RIGHT ARM  Final   Special Requests   Final    BOTTLES DRAWN AEROBIC AND ANAEROBIC  AER 3CC ANA 5CC   Culture NO GROWTH 5 DAYS  Final   Report Status 10/06/2015 FINAL  Final  CULTURE, BLOOD (ROUTINE X 2) w Reflex to PCR ID Panel     Status: None   Collection Time: 10/01/15 11:14 AM  Result Value Ref Range Status   Specimen Description BLOOD LEFT HAND  Final   Special Requests   Final    BOTTLES DRAWN AEROBIC AND ANAEROBIC  AER 3CC ANA 4CC   Culture  Setup Time   Final    GRAM POSITIVE COCCI ANAEROBIC BOTTLE ONLY CRITICAL RESULT CALLED TO, READ BACK BY AND VERIFIED WITH: CRYSTAL SCARPENA AT 0915 ON 10/02/15 CTJ    Culture   Final    COAGULASE NEGATIVE STAPHYLOCOCCUS ANAEROBIC BOTTLE ONLY Results consistent with contamination.    Report Status 10/06/2015 FINAL  Final  Blood Culture ID Panel (Reflexed)     Status: Abnormal   Collection Time: 10/01/15 11:14 AM  Result Value Ref Range Status   Enterococcus species NOT DETECTED NOT DETECTED Final   Listeria monocytogenes NOT DETECTED NOT DETECTED Final   Staphylococcus species DETECTED (A) NOT DETECTED Final   Staphylococcus aureus NOT DETECTED NOT DETECTED Final   Streptococcus species NOT DETECTED NOT DETECTED Final   Streptococcus agalactiae NOT DETECTED NOT DETECTED Final   Streptococcus pneumoniae NOT DETECTED NOT DETECTED Final   Streptococcus pyogenes NOT DETECTED NOT DETECTED Final   Acinetobacter baumannii NOT DETECTED NOT DETECTED Final   Enterobacteriaceae species NOT DETECTED NOT DETECTED Final   Enterobacter cloacae complex NOT DETECTED NOT DETECTED Final   Escherichia coli NOT DETECTED NOT DETECTED  Final   Klebsiella oxytoca NOT DETECTED NOT DETECTED Final  Klebsiella pneumoniae NOT DETECTED NOT DETECTED Final   Proteus species NOT DETECTED NOT DETECTED Final   Serratia marcescens NOT DETECTED NOT DETECTED Final   Haemophilus influenzae NOT DETECTED NOT DETECTED Final   Neisseria meningitidis NOT DETECTED NOT DETECTED Final   Pseudomonas aeruginosa NOT DETECTED NOT DETECTED Final   Candida albicans NOT DETECTED NOT DETECTED Final   Candida glabrata NOT DETECTED NOT DETECTED Final   Candida krusei NOT DETECTED NOT DETECTED Final   Candida parapsilosis NOT DETECTED NOT DETECTED Final   Candida tropicalis NOT DETECTED NOT DETECTED Final   Carbapenem resistance NOT DETECTED NOT DETECTED Final   Methicillin resistance DETECTED (A) NOT DETECTED Final    Comment: CRITICAL RESULT CALLED TO, READ BACK BY AND VERIFIED WITH: CRYSTAL SCARPENA FOR STAPHYLOCOCCUS SPECIES AND METHICILLIN RESISTANCE AT 0915 ON 10/02/15 CTJ    Vancomycin resistance NOT DETECTED NOT DETECTED Final  Culture, expectorated sputum-assessment     Status: None   Collection Time: 10/01/15 11:51 AM  Result Value Ref Range Status   Specimen Description EXPECTORATED SPUTUM  Final   Special Requests Normal  Final   Sputum evaluation THIS SPECIMEN IS ACCEPTABLE FOR SPUTUM CULTURE  Final   Report Status 10/01/2015 FINAL  Final  Culture, respiratory (NON-Expectorated)     Status: None   Collection Time: 10/01/15 11:51 AM  Result Value Ref Range Status   Specimen Description EXPECTORATED SPUTUM  Final   Special Requests Normal Reflexed from [id number]  Final   Gram Stain   Final    GOOD SPECIMEN - 80-90% WBCS MODERATE WBC SEEN FEW YEAST    Culture   Final    LIGHT GROWTH CANDIDA ALBICANS LIGHT GROWTH CANDIDA DUBLINIENSIS    Report Status 10/06/2015 FINAL  Final  Culture, expectorated sputum-assessment     Status: None   Collection Time: 10/08/15  3:30 PM  Result Value Ref Range Status   Specimen Description TRACHEAL  ASPIRATE  Final   Special Requests NONE  Final   Sputum evaluation THIS SPECIMEN IS ACCEPTABLE FOR SPUTUM CULTURE  Final   Report Status 10/08/2015 FINAL  Final  Culture, respiratory (NON-Expectorated)     Status: None   Collection Time: 10/08/15  3:30 PM  Result Value Ref Range Status   Specimen Description TRACHEAL ASPIRATE  Final   Special Requests NONE Reflexed from [id number]  Final   Gram Stain   Final    FAIR SPECIMEN - 70-80% WBCS FEW WBC SEEN RARE GRAM POSITIVE COCCI RARE GRAM NEGATIVE RODS    Culture Consistent with normal respiratory flora.  Final   Report Status 10/11/2015 FINAL  Final  Culture, bal-quantitative     Status: None (Preliminary result)   Collection Time: 10/12/15 12:16 PM  Result Value Ref Range Status   Specimen Description BRONCHIAL ALVEOLAR LAVAGE  Final   Special Requests Immunocompromised  Final   Gram Stain   Final    FEW WBC SEEN FEW GRAM POSITIVE COCCI FAIR SPECIMEN - 70-80% WBCS    Culture   Final    RARE GROWTH YEAST IDENTIFICATION TO FOLLOW ONCE ISOLATED    Report Status PENDING  Incomplete  CULTURE, BLOOD (ROUTINE X 2) w Reflex to PCR ID Panel     Status: None   Collection Time: 10/12/15  3:59 PM  Result Value Ref Range Status   Specimen Description BLOOD RIGHT ARM  Final   Special Requests   Final    BOTTLES DRAWN AEROBIC AND ANAEROBIC 7CC AERO 9CC ANA   Culture NO GROWTH  5 DAYS  Final   Report Status 10/17/2015 FINAL  Final  CULTURE, BLOOD (ROUTINE X 2) w Reflex to PCR ID Panel     Status: None (Preliminary result)   Collection Time: 10/12/15  4:07 PM  Result Value Ref Range Status   Specimen Description BLOOD LEFT HAND  Final   Special Requests   Final    BOTTLES DRAWN AEROBIC AND ANAEROBIC 7CC AERO 9CC ANA   Culture  Setup Time   Final    GRAM POSITIVE COCCI AEROBIC BOTTLE ONLY CRITICAL RESULT CALLED TO, READ BACK BY AND VERIFIED WITH: MATT MCBANE AT 1610 10/14/15 DV    Culture   Final    COAGULASE NEGATIVE  STAPHYLOCOCCUS AEROBIC BOTTLE ONLY Results consistent with contamination.    Report Status PENDING  Incomplete  Blood Culture ID Panel (Reflexed)     Status: Abnormal   Collection Time: 10/12/15  4:07 PM  Result Value Ref Range Status   Enterococcus species NOT DETECTED NOT DETECTED Final   Listeria monocytogenes NOT DETECTED NOT DETECTED Final   Staphylococcus species DETECTED (A) NOT DETECTED Final    Comment: CRITICAL RESULT CALLED TO, READ BACK BY AND VERIFIED WITH: MATT MCBANE AT 9604 10/14/15 DV    Staphylococcus aureus NOT DETECTED NOT DETECTED Final   Streptococcus species NOT DETECTED NOT DETECTED Final   Streptococcus agalactiae NOT DETECTED NOT DETECTED Final   Streptococcus pneumoniae NOT DETECTED NOT DETECTED Final   Streptococcus pyogenes NOT DETECTED NOT DETECTED Final   Acinetobacter baumannii NOT DETECTED NOT DETECTED Final   Enterobacteriaceae species NOT DETECTED NOT DETECTED Final   Enterobacter cloacae complex NOT DETECTED NOT DETECTED Final   Escherichia coli NOT DETECTED NOT DETECTED Final   Klebsiella oxytoca NOT DETECTED NOT DETECTED Final   Klebsiella pneumoniae NOT DETECTED NOT DETECTED Final   Proteus species NOT DETECTED NOT DETECTED Final   Serratia marcescens NOT DETECTED NOT DETECTED Final   Haemophilus influenzae NOT DETECTED NOT DETECTED Final   Neisseria meningitidis NOT DETECTED NOT DETECTED Final   Pseudomonas aeruginosa NOT DETECTED NOT DETECTED Final   Candida albicans NOT DETECTED NOT DETECTED Final   Candida glabrata NOT DETECTED NOT DETECTED Final   Candida krusei NOT DETECTED NOT DETECTED Final   Candida parapsilosis NOT DETECTED NOT DETECTED Final   Candida tropicalis NOT DETECTED NOT DETECTED Final   Carbapenem resistance NOT DETECTED NOT DETECTED Final   Methicillin resistance DETECTED (A) NOT DETECTED Final    Comment: CRITICAL RESULT CALLED TO, READ BACK BY AND VERIFIED WITH: MATT MCBANE AT 5409 10/14/15 DV    Vancomycin resistance  NOT DETECTED NOT DETECTED Final    Medications:  Scheduled:  . ALPRAZolam  0.5 mg Oral TID  . alteplase  2 mg Intracatheter Once  . alteplase  2 mg Intracatheter Once  . amiodarone  200 mg Per Tube BID  . antiseptic oral rinse  7 mL Mouth Rinse 10 times per day  . aspirin  325 mg Oral Daily  . budesonide  0.25 mg Nebulization 4 times per day  . chlorhexidine gluconate  15 mL Mouth Rinse BID  . feeding supplement (PRO-STAT SUGAR FREE 64)  30 mL Per Tube TID  . free water  30 mL Per Tube 6 times per day  . Influenza vac split quadrivalent PF  0.5 mL Intramuscular Tomorrow-1000  . ipratropium-albuterol  3 mL Nebulization Q6H  . lactulose  10 g Oral BID  . meropenem (MERREM) IV  1 g Intravenous Q12H  . nystatin  Topical BID  . pantoprazole sodium  40 mg Per Tube BID  . senna-docusate  1 tablet Oral BID  . vancomycin  1,500 mg Intravenous Q24H   Infusions:  . argatroban 0.0625 mcg/kg/min (10/16/15 1900)  . feeding supplement (VITAL HIGH PROTEIN) 1,000 mL (10/16/15 0740)  . norepinephrine (LEVOPHED) Adult infusion 22 mcg/min (10/17/15 0600)  . pureflow 2,500 mL/hr at 10/16/15 1427    Assessment: Pharmacy consulted for medication management for 40 yo male critical care patient requiring CRRT and mechanical ventilation.   Plan:  1. Patient previously ordered cefepime, started 1/24. ID transitioned to meropenem on 1/26. Will continue patient on meropenem 1g IV Q12hr.    2. Patient started on vancomycin 1500mg  IV Q24hr while on CRRT.  01/28  random level is 16 mcg/ml. Will continue with current vancomycin dosing.  3. No further renal adjustments warranted at this time.    4. Last BM 1/24. Continue senna/docusate 1 tab BID. Will increase lactulose to 30 g vt bid.   5. No electrolyte replacement warranted at this time. Will recheck with am labs.   Pharmacy will continue to monitor and adjust per consult.     Luisa Hart, PharmD Clinical Pharmacist    10/17/2015

## 2015-10-17 NOTE — Progress Notes (Signed)
[facility]  ROUNDING NOTE   Subjective:   DNR now.   CRRT ran well overnight. BFR 300 UF 50 DFR 2.5K UF 1139  Norepinephrine  Objective:  Vital signs in last 24 hours:  Temp:  [97.2 F (36.2 C)-99.7 F (37.6 C)] 98.9 F (37.2 C) (01/29 0700) Pulse Rate:  [100-128] 114 (01/29 0800) Resp:  [0-30] 13 (01/29 0800) BP: (79-98)/(38-66) 89/47 mmHg (01/29 0800) SpO2:  [90 %-95 %] 92 % (01/29 0800) FiO2 (%):  [70 %] 70 % (01/29 0804) Weight:  [276.8 kg (610 lb 3.7 oz)] 276.8 kg (610 lb 3.7 oz) (01/29 0442)  Weight change: 0 kg (0 lb) Filed Weights   10/15/15 0500 10/16/15 0407 10/17/15 0442  Weight: 280.4 kg (618 lb 2.7 oz) 276.8 kg (610 lb 3.7 oz) 276.8 kg (610 lb 3.7 oz)    Intake/Output: I/O last 3 completed shifts: In: 2926.7 [I.V.:596.7; NG/GT:1530; IV Piggyback:800] Out: 1709 [Urine:13; Other:1696]   Intake/Output this shift:  Total I/O In: -  Out: 48 [Other:48]  Physical Exam: General: Critically ill  Head: +OGT  Eyes: Muddy sclera  Neck: Tracheostomy in place  Lungs:  Bilateral rhonchi, vent assisted, PRVC Fio2 70%  Heart: S1S2 no rubs  Abdomen:  Soft, nontender, obese  Extremities: 3+ generalized edema.  Neurologic: sedated  Skin: No lesions  Access Right IJ temp HD catheter 1/18 Dr. Gilda Crease  GU: Foley    Basic Metabolic Panel:  Recent Labs Lab 10/14/15 0400  10/15/15 0408  10/15/15 1215  10/16/15 0140 10/16/15 1006 10/16/15 1625 10/16/15 2220 10/17/15 0415  NA 138  < > 136  < >  --   < > 137 137 135 136 137  K 4.6  < > 4.2  < >  --   < > 3.8 3.8 3.7 4.0 3.6  CL 103  < > 100*  < >  --   < > 102 102 101 101 101  CO2 26  < > 24  < >  --   < > GLUCOSE 130*  < > 120*  < >  --   < > 120* 121* 159* 108* 119*  BUN 79*  < > 70*  < >  --   < > 66* 63* 62* 63* 58*  CREATININE 2.66*  < > 2.06*  < >  --   < > 1.56* 1.53* 1.47* 1.31* 1.21  CALCIUM 8.9  < > 8.7*  < >  --   < > 8.8* 8.8* 8.5* 8.6* 8.7*  MG 2.0  --  1.9   --  1.8  --  1.9  --   --   --  1.9  PHOS 3.2  < > UNABLE TO REPORT DUE TO ICTERUS  --   --   < > UNABLE TO REPORT DUE TO ICTERUS unable to report due to icterus UNABLE TO REPORT DUE TO ICTERUS UNABLE TO REPORT DUE TO ICTERUS UNABLE TO REPORT DUE TO ICTERUS  < > = values in this interval not displayed.  Liver Function Tests:  Recent Labs Lab 10/11/15 0429  10/15/15 1215  10/16/15 0140 10/16/15 1006 10/16/15 1625 10/16/15 2220 10/17/15 0415  AST 176*  --  2266*  --  2273*  --   --   --   --   ALT 60  --  1115*  --  1310*  --   --   --   --   ALKPHOS 103  --  134*  --  164*  --   --   --   --   BILITOT 3.8*  --  16.0*  --  19.1*  --   --   --   --   PROT 6.9  --  6.5  --  6.5  --   --   --   --   ALBUMIN 3.0*  < > 2.3*  < > 2.3* 2.2* 2.1* 2.2* 2.2*  < > = values in this interval not displayed. No results for input(s): LIPASE, AMYLASE in the last 168 hours. No results for input(s): AMMONIA in the last 168 hours.  CBC:  Recent Labs Lab 10/13/15 0607 10/14/15 0400 10/15/15 0408 10/16/15 0140 10/17/15 0415  WBC 14.2* 16.8* 18.0* 21.4* 26.3*  HGB 9.9* 9.2* 9.5* 10.0* 9.7*  HCT 33.5* 31.2* 31.3* 32.7* 32.0*  MCV 88.8 85.1 85.5 84.5 86.2  PLT 45* 59* 67* 82* 97*    Cardiac Enzymes:  Recent Labs Lab 10/15/15 1749 10/16/15 0140 10/16/15 0620  TROPONINI 0.06* 0.07* 0.05*    BNP: Invalid input(s): POCBNP  CBG:  Recent Labs Lab 10/16/15 1708 10/16/15 1934 10/16/15 2337 10/17/15 0355 10/17/15 0740  GLUCAP 113* 90 107* 112* 105*    Microbiology: Results for orders placed or performed during the hospital encounter of 09/12/2015  Blood culture (routine x 2)     Status: None   Collection Time: 08/27/2015 11:53 PM  Result Value Ref Range Status   Specimen Description BLOOD LEFT HAND  Final   Special Requests BOTTLES DRAWN AEROBIC AND ANAEROBIC 4CC  Final   Culture NO GROWTH 5 DAYS  Final   Report Status 09/23/2015 FINAL  Final  Blood culture (routine x 2)      Status: None   Collection Time: 08/22/2015 11:53 PM  Result Value Ref Range Status   Specimen Description BLOOD LEFT ARM  Final   Special Requests BOTTLES DRAWN AEROBIC AND ANAEROBIC 5CC  Final   Culture NO GROWTH 5 DAYS  Final   Report Status 09/23/2015 FINAL  Final  Urine culture     Status: None   Collection Time: 09/18/15 12:21 AM  Result Value Ref Range Status   Specimen Description URINE, RANDOM  Final   Special Requests NONE  Final   Culture MULTIPLE SPECIES PRESENT, SUGGEST RECOLLECTION  Final   Report Status 09/19/2015 FINAL  Final  Rapid Influenza A&B Antigens ([facility] only)     Status: None   Collection Time: 09/18/15  6:26 AM  Result Value Ref Range Status   Influenza A ([facility]) NOT DETECTED  Final   Influenza B ([facility]) NOT DETECTED  Final  MRSA PCR Screening     Status: None   Collection Time: 09/20/15  6:30 AM  Result Value Ref Range Status   MRSA by PCR NEGATIVE NEGATIVE Final    Comment:        The GeneXpert MRSA Assay (FDA approved for NASAL specimens only), is one component of a comprehensive MRSA colonization surveillance program. It is not intended to diagnose MRSA infection nor to guide or monitor treatment for MRSA infections.   Culture, respiratory (NON-Expectorated)     Status: None   Collection Time: 09/22/15  3:13 PM  Result Value Ref Range Status   Specimen Description TRACHEAL ASPIRATE  Final   Special Requests NONE  Final   Gram Stain   Final    GOOD SPECIMEN - 80-90% WBCS MODERATE WBC SEEN RARE YEAST RARE GRAM NEGATIVE COCCOBACILLI  Culture LIGHT GROWTH CANDIDA TROPICALIS  Final   Report Status 09/26/2015 FINAL  Final  Urine culture     Status: None   Collection Time: 09/22/15 11:16 PM  Result Value Ref Range Status   Specimen Description URINE, RANDOM  Final   Special Requests NONE  Final   Culture NO GROWTH 2 DAYS  Final   Report Status 09/24/2015 FINAL  Final  CULTURE, BLOOD (ROUTINE X 2) w Reflex to PCR ID Panel     Status: None    Collection Time: 09/22/15 11:44 PM  Result Value Ref Range Status   Specimen Description BLOOD RIGHT ANTECUBITAL  Final   Special Requests BOTTLES DRAWN AEROBIC AND ANAEROBIC  Final   Culture NO GROWTH 5 DAYS  Final   Report Status 09/28/2015 FINAL  Final  CULTURE, BLOOD (ROUTINE X 2) w Reflex to PCR ID Panel     Status: None   Collection Time: 09/22/15 11:56 PM  Result Value Ref Range Status   Specimen Description BLOOD LEFT HAND  Final   Special Requests BOTTLES DRAWN AEROBIC AND ANAEROBIC  Final   Culture NO GROWTH 5 DAYS  Final   Report Status 09/28/2015 FINAL  Final  Culture, respiratory (NON-Expectorated)     Status: None   Collection Time: 09/23/15 12:01 AM  Result Value Ref Range Status   Specimen Description TRACHEAL ASPIRATE  Final   Special Requests NONE  Final   Gram Stain   Final    GOOD SPECIMEN - 80-90% WBCS MODERATE WBC SEEN RARE YEAST RARE GRAM POSITIVE COCCI    Culture Consistent with normal respiratory flora.  Final   Report Status 09/25/2015 FINAL  Final  Urine culture     Status: None   Collection Time: 10/01/15 10:35 AM  Result Value Ref Range Status   Specimen Description URINE, RANDOM  Final   Special Requests NONE  Final   Culture NO GROWTH 2 DAYS  Final   Report Status 10/03/2015 FINAL  Final  CULTURE, BLOOD (ROUTINE X 2) w Reflex to PCR ID Panel     Status: None   Collection Time: 10/01/15 10:56 AM  Result Value Ref Range Status   Specimen Description BLOOD RIGHT ARM  Final   Special Requests   Final    BOTTLES DRAWN AEROBIC AND ANAEROBIC  AER 3CC ANA 5CC   Culture NO GROWTH 5 DAYS  Final   Report Status 10/06/2015 FINAL  Final  CULTURE, BLOOD (ROUTINE X 2) w Reflex to PCR ID Panel     Status: None   Collection Time: 10/01/15 11:14 AM  Result Value Ref Range Status   Specimen Description BLOOD LEFT HAND  Final   Special Requests   Final    BOTTLES DRAWN AEROBIC AND ANAEROBIC  AER 3CC ANA 4CC   Culture  Setup Time   Final    GRAM  POSITIVE COCCI ANAEROBIC BOTTLE ONLY CRITICAL RESULT CALLED TO, READ BACK BY AND VERIFIED WITH: CRYSTAL SCARPENA AT 0915 ON 10/02/15 CTJ    Culture   Final    COAGULASE NEGATIVE STAPHYLOCOCCUS ANAEROBIC BOTTLE ONLY Results consistent with contamination.    Report Status 10/06/2015 FINAL  Final  Blood Culture ID Panel (Reflexed)     Status: Abnormal   Collection Time: 10/01/15 11:14 AM  Result Value Ref Range Status   Enterococcus species NOT DETECTED NOT DETECTED Final   Listeria monocytogenes NOT DETECTED NOT DETECTED Final   Staphylococcus species DETECTED (A) NOT DETECTED Final   Staphylococcus  aureus NOT DETECTED NOT DETECTED Final   Streptococcus species NOT DETECTED NOT DETECTED Final   Streptococcus agalactiae NOT DETECTED NOT DETECTED Final   Streptococcus pneumoniae NOT DETECTED NOT DETECTED Final   Streptococcus pyogenes NOT DETECTED NOT DETECTED Final   Acinetobacter baumannii NOT DETECTED NOT DETECTED Final   Enterobacteriaceae species NOT DETECTED NOT DETECTED Final   Enterobacter cloacae complex NOT DETECTED NOT DETECTED Final   Escherichia coli NOT DETECTED NOT DETECTED Final   Klebsiella oxytoca NOT DETECTED NOT DETECTED Final   Klebsiella pneumoniae NOT DETECTED NOT DETECTED Final   Proteus species NOT DETECTED NOT DETECTED Final   Serratia marcescens NOT DETECTED NOT DETECTED Final   Haemophilus influenzae NOT DETECTED NOT DETECTED Final   Neisseria meningitidis NOT DETECTED NOT DETECTED Final   Pseudomonas aeruginosa NOT DETECTED NOT DETECTED Final   Candida albicans NOT DETECTED NOT DETECTED Final   Candida glabrata NOT DETECTED NOT DETECTED Final   Candida krusei NOT DETECTED NOT DETECTED Final   Candida parapsilosis NOT DETECTED NOT DETECTED Final   Candida tropicalis NOT DETECTED NOT DETECTED Final   Carbapenem resistance NOT DETECTED NOT DETECTED Final   Methicillin resistance DETECTED (A) NOT DETECTED Final    Comment: CRITICAL RESULT CALLED TO, READ BACK  BY AND VERIFIED WITH: CRYSTAL SCARPENA FOR STAPHYLOCOCCUS SPECIES AND METHICILLIN RESISTANCE AT 0915 ON 10/02/15 CTJ    Vancomycin resistance NOT DETECTED NOT DETECTED Final  Culture, expectorated sputum-assessment     Status: None   Collection Time: 10/01/15 11:51 AM  Result Value Ref Range Status   Specimen Description EXPECTORATED SPUTUM  Final   Special Requests Normal  Final   Sputum evaluation THIS SPECIMEN IS ACCEPTABLE FOR SPUTUM CULTURE  Final   Report Status 10/01/2015 FINAL  Final  Culture, respiratory (NON-Expectorated)     Status: None   Collection Time: 10/01/15 11:51 AM  Result Value Ref Range Status   Specimen Description EXPECTORATED SPUTUM  Final   Special Requests Normal Reflexed from [id number]  Final   Gram Stain   Final    GOOD SPECIMEN - 80-90% WBCS MODERATE WBC SEEN FEW YEAST    Culture   Final    LIGHT GROWTH CANDIDA ALBICANS LIGHT GROWTH CANDIDA DUBLINIENSIS    Report Status 10/06/2015 FINAL  Final  Culture, expectorated sputum-assessment     Status: None   Collection Time: 10/08/15  3:30 PM  Result Value Ref Range Status   Specimen Description TRACHEAL ASPIRATE  Final   Special Requests NONE  Final   Sputum evaluation THIS SPECIMEN IS ACCEPTABLE FOR SPUTUM CULTURE  Final   Report Status 10/08/2015 FINAL  Final  Culture, respiratory (NON-Expectorated)     Status: None   Collection Time: 10/08/15  3:30 PM  Result Value Ref Range Status   Specimen Description TRACHEAL ASPIRATE  Final   Special Requests NONE Reflexed from [id number]  Final   Gram Stain   Final    FAIR SPECIMEN - 70-80% WBCS FEW WBC SEEN RARE GRAM POSITIVE COCCI RARE GRAM NEGATIVE RODS    Culture Consistent with normal respiratory flora.  Final   Report Status 10/11/2015 FINAL  Final  Culture, bal-quantitative     Status: None (Preliminary result)   Collection Time: 10/12/15 12:16 PM  Result Value Ref Range Status   Specimen Description BRONCHIAL ALVEOLAR LAVAGE  Final   Special Requests  Immunocompromised  Final   Gram Stain   Final    FEW WBC SEEN FEW GRAM POSITIVE COCCI FAIR SPECIMEN - 70-80% WBCS  Culture   Final    RARE GROWTH YEAST IDENTIFICATION TO FOLLOW ONCE ISOLATED    Report Status PENDING  Incomplete  CULTURE, BLOOD (ROUTINE X 2) w Reflex to PCR ID Panel     Status: None   Collection Time: 10/12/15  3:59 PM  Result Value Ref Range Status   Specimen Description BLOOD RIGHT ARM  Final   Special Requests   Final    BOTTLES DRAWN AEROBIC AND ANAEROBIC 7CC AERO 9CC ANA   Culture NO GROWTH 5 DAYS  Final   Report Status 10/17/2015 FINAL  Final  CULTURE, BLOOD (ROUTINE X 2) w Reflex to PCR ID Panel     Status: None   Collection Time: 10/12/15  4:07 PM  Result Value Ref Range Status   Specimen Description BLOOD LEFT HAND  Final   Special Requests   Final    BOTTLES DRAWN AEROBIC AND ANAEROBIC 7CC AERO 9CC ANA   Culture  Setup Time   Final    GRAM POSITIVE COCCI AEROBIC BOTTLE ONLY CRITICAL RESULT CALLED TO, READ BACK BY AND VERIFIED WITH: MATT MCBANE AT 5366 10/14/15 DV    Culture   Final    COAGULASE NEGATIVE STAPHYLOCOCCUS AEROBIC BOTTLE ONLY Results consistent with contamination.    Report Status 10/17/2015 FINAL  Final  Blood Culture ID Panel (Reflexed)     Status: Abnormal   Collection Time: 10/12/15  4:07 PM  Result Value Ref Range Status   Enterococcus species NOT DETECTED NOT DETECTED Final   Listeria monocytogenes NOT DETECTED NOT DETECTED Final   Staphylococcus species DETECTED (A) NOT DETECTED Final    Comment: CRITICAL RESULT CALLED TO, READ BACK BY AND VERIFIED WITH: MATT MCBANE AT 4403 10/14/15 DV    Staphylococcus aureus NOT DETECTED NOT DETECTED Final   Streptococcus species NOT DETECTED NOT DETECTED Final   Streptococcus agalactiae NOT DETECTED NOT DETECTED Final   Streptococcus pneumoniae NOT DETECTED NOT DETECTED Final   Streptococcus pyogenes NOT DETECTED NOT DETECTED Final   Acinetobacter baumannii NOT DETECTED NOT DETECTED  Final   Enterobacteriaceae species NOT DETECTED NOT DETECTED Final   Enterobacter cloacae complex NOT DETECTED NOT DETECTED Final   Escherichia coli NOT DETECTED NOT DETECTED Final   Klebsiella oxytoca NOT DETECTED NOT DETECTED Final   Klebsiella pneumoniae NOT DETECTED NOT DETECTED Final   Proteus species NOT DETECTED NOT DETECTED Final   Serratia marcescens NOT DETECTED NOT DETECTED Final   Haemophilus influenzae NOT DETECTED NOT DETECTED Final   Neisseria meningitidis NOT DETECTED NOT DETECTED Final   Pseudomonas aeruginosa NOT DETECTED NOT DETECTED Final   Candida albicans NOT DETECTED NOT DETECTED Final   Candida glabrata NOT DETECTED NOT DETECTED Final   Candida krusei NOT DETECTED NOT DETECTED Final   Candida parapsilosis NOT DETECTED NOT DETECTED Final   Candida tropicalis NOT DETECTED NOT DETECTED Final   Carbapenem resistance NOT DETECTED NOT DETECTED Final   Methicillin resistance DETECTED (A) NOT DETECTED Final    Comment: CRITICAL RESULT CALLED TO, READ BACK BY AND VERIFIED WITH: MATT MCBANE AT 0640 10/14/15 DV    Vancomycin resistance NOT DETECTED NOT DETECTED Final    Coagulation Studies:  Recent Labs  10/15/15 1306  LABPROT 22.4*  INR 1.98    Urinalysis: No results for input(s): COLORURINE, LABSPEC, PHURINE, GLUCOSEU, HGBUR, BILIRUBINUR, KETONESUR, PROTEINUR, UROBILINOGEN, NITRITE, LEUKOCYTESUR in the last 72 hours.  Invalid input(s): APPERANCEUR    Imaging: Dg Chest Port 1 View  10/16/2015  CLINICAL DATA:  PICC line placement. EXAM:  PORTABLE CHEST 1 VIEW COMPARISON:  10/16/2015, earlier the same day. FINDINGS: 1509 hours. Low volume film. Patient rotated to the left. X-ray is under penetrated. The right PICC line has been repositioned in the interval no longer courses cranially in the neck. The tip of the right PICC line is not well visualized and may be positioned the level of the proximal SVC, but superimposition of cardiomediastinal tissues makes precise  tip localization difficult. The right IJ catheter remains in place. Left IJ catheter is again no. Tracheostomy tube again noted. There is an NG tube visualized although it becomes obscure over the mediastinum. Lung volumes are low. There is vascular congestion with probable associated pulmonary edema and left base collapse/ consolidation. Telemetry leads overlie the chest. IMPRESSION: Right PICC line has been repositioned in the interval and is now directed centrally although the tip cannot be confidently visualized secondary to obscuration by cardiomediastinal tissues. Otherwise stable exam. Electronically Signed   By: Kennith Center M.D.   On: 10/16/2015 15:28   Dg Chest Port 1 View  10/16/2015  CLINICAL DATA:  40 year old male with PICC line placement. Respiratory failure. EXAM: PORTABLE CHEST 1 VIEW COMPARISON:  10/14/2015 and prior exams FINDINGS: A right PICC line is identified extending cephalad in the neck with tip overlying the mid -upper right neck. Tracheostomy tube, bilateral IJ central venous catheters and NG tube again noted. Cardiomegaly with pulmonary vascular congestion, pulmonary edema and left lower lung consolidation/atelectasis again noted. There is no evidence of pneumothorax. IMPRESSION: Right PICC line extending cephalad into the neck with tip overlying the mid -upper right neck. Recommend repositioning. Otherwise no significant change. Cardiomegaly, pulmonary vascular congestion, edema and left lower lung consolidation/atelectasis. This was discussed with Charli, R.N., at 3:05 p.m. on 10/16/2015. Electronically Signed   By: Harmon Pier M.D.   On: 10/16/2015 15:07     Medications:   . argatroban 0.0625 mcg/kg/min (10/16/15 1900)  . feeding supplement (VITAL HIGH PROTEIN) 1,000 mL (10/16/15 0740)  . norepinephrine (LEVOPHED) Adult infusion 22 mcg/min (10/17/15 0600)  . pureflow 2,500 mL/hr at 10/17/15 0800   . ALPRAZolam  0.5 mg Oral TID  . alteplase  2 mg Intracatheter Once  .  alteplase  2 mg Intracatheter Once  . amiodarone  200 mg Per Tube BID  . antiseptic oral rinse  7 mL Mouth Rinse 10 times per day  . aspirin  325 mg Oral Daily  . budesonide  0.25 mg Nebulization 4 times per day  . chlorhexidine gluconate  15 mL Mouth Rinse BID  . feeding supplement (PRO-STAT SUGAR FREE 64)  30 mL Per Tube TID  . free water  30 mL Per Tube 6 times per day  . Influenza vac split quadrivalent PF  0.5 mL Intramuscular Tomorrow-1000  . ipratropium-albuterol  3 mL Nebulization Q6H  . lactulose  30 g Per Tube BID  . meropenem (MERREM) IV  1 g Intravenous Q12H  . nystatin   Topical BID  . pantoprazole sodium  40 mg Per Tube BID  . senna-docusate  1 tablet Oral BID  . vancomycin  1,500 mg Intravenous Q24H   acetaminophen **OR** acetaminophen, albuterol, fentaNYL (SUBLIMAZE) injection, metoprolol, midazolam, ondansetron **OR** ondansetron (ZOFRAN) IV, sodium chloride  Assessment/ Plan:  Mr. Mitchell Morrow is a 40 y.o. black male with morbid obesity, tobacco abuse, without prescribed home medications, who was admitted to [facility] on 2015-10-13, s/p tracheostomy placement, persistent respiratory failure, multiple episodes of ARF  1. Acute Renal Failure:  acute renal failure  anuric. Baseline creatinine of 1.15 on admission. Started CRRT 10/06/15, trial of intermittent hemodialysis 1/24. Restarted 1/25 CRRT.   - Continue CRRT. Patient hemodynamically unstable. anuric.  - Continue to monitor volume status, urine output, serum electrolytes and renal function.  - Renally dose all medications   2. Acute hypercapnic respiratory failure with bilateral pneumonia and acute exacerbation of systolic congestive heart failure with respiratory acidosis:  With underlying obesity hypoventilation syndrome. Tracheostomy 10/11/2015. Ventilator dependent.  - Appreciate pulm input.   3.  Anemia with renal failure: hemoglobin 9.7. With thrombocytopenia 82, HIT positive - No acute indication for epo or  transfusion. - Avoid heparin products. HIT positive  4. Hypotension/sepsis: on vasopressors: norepinephrine. Wbc trending upward. Blood cultures growing staph.  - merepenem and vanco - Appreciate ID input.   Overall prognosis is quite poor.    LOS: 30 Mitchell Morrow 1/29/20179:12 AM

## 2015-10-18 LAB — GLUCOSE, CAPILLARY
GLUCOSE-CAPILLARY: 126 mg/dL — AB (ref 65–99)
Glucose-Capillary: 108 mg/dL — ABNORMAL HIGH (ref 65–99)
Glucose-Capillary: 125 mg/dL — ABNORMAL HIGH (ref 65–99)

## 2015-10-18 LAB — RENAL FUNCTION PANEL
ALBUMIN: 1.9 g/dL — AB (ref 3.5–5.0)
ALBUMIN: 2 g/dL — AB (ref 3.5–5.0)
ANION GAP: 7 (ref 5–15)
ANION GAP: 7 (ref 5–15)
BUN: 50 mg/dL — AB (ref 6–20)
BUN: 52 mg/dL — AB (ref 6–20)
CALCIUM: 7.7 mg/dL — AB (ref 8.9–10.3)
CALCIUM: 7.9 mg/dL — AB (ref 8.9–10.3)
CO2: 27 mmol/L (ref 22–32)
CO2: 26 mmol/L (ref 22–32)
CREATININE: 0.8 mg/dL (ref 0.61–1.24)
Chloride: 102 mmol/L (ref 101–111)
Chloride: 99 mmol/L — ABNORMAL LOW (ref 101–111)
Creatinine, Ser: UNDETERMINED mg/dL (ref 0.61–1.24)
GFR calc Af Amer: >60 mL/min (ref >60)
GFR calc non Af Amer: >60 mL/min (ref >60)
GLUCOSE: 250 mg/dL — AB (ref 65–99)
GLUCOSE: 287 mg/dL — AB (ref 65–99)
PHOSPHORUS: UNDETERMINED mg/dL (ref 2.5–4.6)
Potassium: 3.1 mmol/L — ABNORMAL LOW (ref 3.5–5.1)
Potassium: 4.5 mmol/L (ref 3.5–5.1)
SODIUM: 136 mmol/L (ref 135–145)
SODIUM: 132 mmol/L — AB (ref 135–145)

## 2015-10-18 LAB — COMPREHENSIVE METABOLIC PANEL
ALBUMIN: 2.1 g/dL — AB (ref 3.5–5.0)
ALT: 1045 U/L — ABNORMAL HIGH (ref 17–63)
ANION GAP: 7 (ref 5–15)
AST: 917 U/L — ABNORMAL HIGH (ref 15–41)
Alkaline Phosphatase: 148 U/L — ABNORMAL HIGH (ref 38–126)
BILIRUBIN TOTAL: 25.5 mg/dL — AB (ref 0.3–1.2)
BUN: 56 mg/dL — ABNORMAL HIGH (ref 6–20)
CO2: 29 mmol/L (ref 22–32)
Calcium: 8.6 mg/dL — ABNORMAL LOW (ref 8.9–10.3)
Chloride: 103 mmol/L (ref 101–111)
Creatinine, Ser: UNDETERMINED mg/dL (ref 0.61–1.24)
GLUCOSE: 138 mg/dL — AB (ref 65–99)
POTASSIUM: 3.5 mmol/L (ref 3.5–5.1)
SODIUM: 139 mmol/L (ref 135–145)
TOTAL PROTEIN: 6.1 g/dL — AB (ref 6.5–8.1)

## 2015-10-18 LAB — VANCOMYCIN, RANDOM: Vancomycin Rm: 17 ug/mL

## 2015-10-18 LAB — CBC
HEMATOCRIT: 32.9 % — AB (ref 40.0–52.0)
Hemoglobin: 10 g/dL — ABNORMAL LOW (ref 13.0–18.0)
MCH: 25.9 pg — AB (ref 26.0–34.0)
MCHC: 30.3 g/dL — AB (ref 32.0–36.0)
MCV: 85.5 fL (ref 80.0–100.0)
Platelets: 124 10*3/uL — ABNORMAL LOW (ref 150–440)
RBC: 3.85 MIL/uL — ABNORMAL LOW (ref 4.40–5.90)
RDW: 24.1 % — AB (ref 11.5–14.5)
WBC: 31.6 10*3/uL — ABNORMAL HIGH (ref 3.8–10.6)

## 2015-10-18 LAB — MAGNESIUM
Magnesium: 1.8 mg/dL (ref 1.7–2.4)
Magnesium: 2.1 mg/dL (ref 1.7–2.4)

## 2015-10-18 LAB — PROTIME-INR
INR: 4.74 — AB
Prothrombin Time: 43.2 seconds — ABNORMAL HIGH (ref 11.4–15.0)

## 2015-10-18 LAB — PHOSPHORUS
PHOSPHORUS: UNDETERMINED mg/dL (ref 2.5–4.6)
PHOSPHORUS: UNDETERMINED mg/dL (ref 2.5–4.6)

## 2015-10-18 LAB — APTT: APTT: 116 s — AB (ref 24–36)

## 2015-10-18 MED ORDER — PUREFLOW DIALYSIS SOLUTION
INTRAVENOUS | Status: DC
  Administered 2015-10-18: 12:00:00 via INTRAVENOUS_CENTRAL

## 2015-10-18 MED ORDER — HEPARIN SODIUM (PORCINE) 1000 UNIT/ML DIALYSIS: 1000.0000 [IU] | INTRAMUSCULAR | Status: DC | PRN

## 2015-10-18 MED ORDER — MAGNESIUM SULFATE 2 GM/50ML IV SOLN
2.0000 g | Freq: Once | INTRAVENOUS | Status: AC
  Administered 2015-10-18: 2 g via INTRAVENOUS
  Filled 2015-10-18: qty 50

## 2015-10-18 MED ORDER — MEROPENEM 1 G IV SOLR
2.0000 g | Freq: Three times a day (TID) | INTRAVENOUS | Status: DC
  Administered 2015-10-18: 2 g via INTRAVENOUS
  Filled 2015-10-18 (×4): qty 2

## 2015-10-18 MED ORDER — VASOPRESSIN 20 UNIT/ML IV SOLN
0.0300 [IU]/min | INTRAVENOUS | Status: DC
  Administered 2015-10-18: 0.03 [IU]/min via INTRAVENOUS
  Filled 2015-10-18: qty 2

## 2015-10-18 MED ORDER — POTASSIUM CHLORIDE 10 MEQ/50ML IV SOLN
10.0000 meq | INTRAVENOUS | Status: DC
  Filled 2015-10-18 (×5): qty 50

## 2015-10-18 MED ORDER — SENNOSIDES-DOCUSATE SODIUM 8.6-50 MG PO TABS: 2.0000 | ORAL_TABLET | Freq: Two times a day (BID) | ORAL | Status: DC

## 2015-10-20 NOTE — Progress Notes (Signed)
Dr. Dema Severin was unable to pronounce pt at time of death due to involvement with critical patient. Mungal came to bedside at 1948 to confirm time of death.

## 2015-10-20 NOTE — Progress Notes (Signed)
Pt has been removed from ventilator post pronunciation of death by Dr. Dema Severin. RN at bedside.

## 2015-10-20 NOTE — Progress Notes (Signed)
Withdraws from pain. No on sedation. ST w/runs of vfib starting at 1640. Sats low to mid 90's on 100%. 8 of peep. 70 mcg of Levophed to maintain MAP of 65. Levophed has been increased from 39 mcg at 0700 to 70 mcg. Vasopressin at 0.03. Tolerating tube feeds. Zero urine output this shift. CRRT has run smoothly today. Mother was called twice today and updated regarding pts poor outlook. She was urged to come and see the pt.

## 2015-10-20 NOTE — Progress Notes (Signed)
Withdraws from pain.  No sedation in use.  Stach with PVCs per cardiac monitor, rate in 1 teens throughout night. O2 sats low to mid 90's on 100% FiO2 and 8 of peep.  64mcg/min of levophed required to maintain MAP of 65 or greater.   Tolerating tube feeds.  ZERO urine output this shift. Continuous dialysis running well. Mother visited last night.

## 2015-10-20 NOTE — Progress Notes (Signed)
[facility]  ROUNDING NOTE   Subjective:   DNR now.  CRRT Cartridge was changed last night Continues to remain on pressors Norepinephrine Vasopressin was also added UF 50 cc/hr Therapy fluid 2.5 L/hr, BFR 300/hr  Objective:  Vital signs in last 24 hours:  Temp:  [98.1 F (36.7 C)-99.6 F (37.6 C)] 98.1 F (36.7 C) (01/30 0734) Pulse Rate:  [111-115] 114 (01/30 0830) Resp:  [0-32] 30 (01/30 0830) BP: (73-95)/(37-62) 85/46 mmHg (01/30 0830) SpO2:  [90 %-97 %] 93 % (01/30 0830) FiO2 (%):  [70 %-100 %] 100 % (01/30 0811) Weight:  [279.4 kg (615 lb 15.4 oz)] 279.4 kg (615 lb 15.4 oz) (01/30 0515)  Weight change: 2.6 kg (5 lb 11.7 oz) Filed Weights   10/16/15 0407 10/17/15 0442 10/10/2015 0515  Weight: 276.8 kg (610 lb 3.7 oz) 276.8 kg (610 lb 3.7 oz) 279.4 kg (615 lb 15.4 oz)    Intake/Output: I/O last 3 completed shifts: In: 49 [I.V.:883; NG/GT:2580; IV Piggyback:800] Out: 1692 [Other:1692]   Intake/Output this shift:  Total I/O In: 126.9 [I.V.:36.9; NG/GT:90] Out: 48 [Other:48]  Physical Exam: General: Critically ill, morbidly obese  Head: +OGT  Eyes: Muddy sclera  Neck: Tracheostomy in place  Lungs:  Bilateral rhonchi, vent assisted, Fio2 100%  Heart: S1S2 no rubs  Abdomen:  Soft, nontender, obese  Extremities: 3+ generalized edema.  Neurologic: sedated  Skin: No lesions  Access Right IJ temp HD catheter 1/18 Dr. Gilda Crease  GU: Foley    Basic Metabolic Panel:  Recent Labs Lab 10/15/15 0408  10/15/15 1215  10/16/15 0140  10/17/15 0415 10/17/15 1027 10/17/15 1708 10/17/15 2157 10/17/2015 0441  NA 136  < >  --   < > 137  < > 137 138 137 138 139  K 4.2  < >  --   < > 3.8  < > 3.6 3.7 3.4* 3.4* 3.5  CL 100*  < >  --   < > 102  < > 101 102 102 102 103  CO2 24  < >  --   < > 22  < > GLUCOSE 120*  < >  --   < > 120*  < > 119* 126* 126* 130* 138*  BUN 70*  < >  --   < > 66*  < > 58* 59* 57* 57* 56*  CREATININE 2.06*  < >  --    < > 1.56*  < > 1.21 UNABLE TO REPORT DUE TO ICTERUS UNABLE TO REPORT DUE TO ICTERUS UNABLE TO REPORT DUE TO ICTERUS UNABLE TO REPORT DUE TO ICTERUS  CALCIUM 8.7*  < >  --   < > 8.8*  < > 8.7* 8.8* 8.4* 8.5* 8.6*  MG 1.9  --  1.8  --  1.9  --  1.9  --   --   --  1.8  PHOS UNABLE TO REPORT DUE TO ICTERUS  --   --   < > UNABLE TO REPORT DUE TO ICTERUS  < > UNABLE TO REPORT DUE TO ICTERUS UNABLE TO REPORT DUE TO ICTERUS UNABLE TO REPORT DUE TO ICTERUS UNABLE TO REPORT DUE TO ICTERUS UNABLE TO REPORT DUE TO ICTERUS  < > = values in this interval not displayed.  Liver Function Tests:  Recent Labs Lab 10/15/15 1215  10/16/15 0140  10/17/15 0415 10/17/15 1027 10/17/15 1708 10/17/15 2157 09/23/2015 0441  AST 2266*  --  2273*  --   --  1152*  --   --  917*  ALT 1115*  --  1310*  --   --  1173*  --   --  1045*  ALKPHOS 134*  --  164*  --   --  155*  --   --  148*  BILITOT 16.0*  --  19.1*  --   --  23.4*  --   --  25.5*  PROT 6.5  --  6.5  --   --  6.2*  --   --  6.1*  ALBUMIN 2.3*  < > 2.3*  < > 2.2* 2.1*  2.1* 2.2* 2.1* 2.1*  < > = values in this interval not displayed. No results for input(s): LIPASE, AMYLASE in the last 168 hours. No results for input(s): AMMONIA in the last 168 hours.  CBC:  Recent Labs Lab 10/14/15 0400 10/15/15 0408 10/16/15 0140 10/17/15 0415 10/07/2015 0441  WBC 16.8* 18.0* 21.4* 26.3* 31.6*  HGB 9.2* 9.5* 10.0* 9.7* 10.0*  HCT 31.2* 31.3* 32.7* 32.0* 32.9*  MCV 85.1 85.5 84.5 86.2 85.5  PLT 59* 67* 82* 97* 124*    Cardiac Enzymes:  Recent Labs Lab 10/15/15 1749 10/16/15 0140 10/16/15 0620  TROPONINI 0.06* 0.07* 0.05*    BNP: Invalid input(s): POCBNP  CBG:  Recent Labs Lab 10/17/15 0740 10/17/15 1129 10/17/15 1535 10/17/15 2359 09/30/2015 0822  GLUCAP 105* 109* 112* 125* 108*    Microbiology: Results for orders placed or performed during the hospital encounter of 09-19-2015  Blood culture (routine x 2)     Status: None   Collection Time:  19-Sep-2015 11:53 PM  Result Value Ref Range Status   Specimen Description BLOOD LEFT HAND  Final   Special Requests BOTTLES DRAWN AEROBIC AND ANAEROBIC 4CC  Final   Culture NO GROWTH 5 DAYS  Final   Report Status 09/23/2015 FINAL  Final  Blood culture (routine x 2)     Status: None   Collection Time: 09/19/15 11:53 PM  Result Value Ref Range Status   Specimen Description BLOOD LEFT ARM  Final   Special Requests BOTTLES DRAWN AEROBIC AND ANAEROBIC 5CC  Final   Culture NO GROWTH 5 DAYS  Final   Report Status 09/23/2015 FINAL  Final  Urine culture     Status: None   Collection Time: 09/18/15 12:21 AM  Result Value Ref Range Status   Specimen Description URINE, RANDOM  Final   Special Requests NONE  Final   Culture MULTIPLE SPECIES PRESENT, SUGGEST RECOLLECTION  Final   Report Status 09/19/2015 FINAL  Final  Rapid Influenza A&B Antigens ([facility] only)     Status: None   Collection Time: 09/18/15  6:26 AM  Result Value Ref Range Status   Influenza A ([facility]) NOT DETECTED  Final   Influenza B ([facility]) NOT DETECTED  Final  MRSA PCR Screening     Status: None   Collection Time: 09/20/15  6:30 AM  Result Value Ref Range Status   MRSA by PCR NEGATIVE NEGATIVE Final    Comment:        The GeneXpert MRSA Assay (FDA approved for NASAL specimens only), is one component of a comprehensive MRSA colonization surveillance program. It is not intended to diagnose MRSA infection nor to guide or monitor treatment for MRSA infections.   Culture, respiratory (NON-Expectorated)     Status: None   Collection Time: 09/22/15  3:13 PM  Result Value Ref Range Status   Specimen Description TRACHEAL ASPIRATE  Final   Special  Requests NONE  Final   Gram Stain   Final    GOOD SPECIMEN - 80-90% WBCS MODERATE WBC SEEN RARE YEAST RARE GRAM NEGATIVE COCCOBACILLI    Culture LIGHT GROWTH CANDIDA TROPICALIS  Final   Report Status 09/26/2015 FINAL  Final  Urine culture     Status: None   Collection Time: 09/22/15  11:16 PM  Result Value Ref Range Status   Specimen Description URINE, RANDOM  Final   Special Requests NONE  Final   Culture NO GROWTH 2 DAYS  Final   Report Status 09/24/2015 FINAL  Final  CULTURE, BLOOD (ROUTINE X 2) w Reflex to PCR ID Panel     Status: None   Collection Time: 09/22/15 11:44 PM  Result Value Ref Range Status   Specimen Description BLOOD RIGHT ANTECUBITAL  Final   Special Requests BOTTLES DRAWN AEROBIC AND ANAEROBIC  Final   Culture NO GROWTH 5 DAYS  Final   Report Status 09/28/2015 FINAL  Final  CULTURE, BLOOD (ROUTINE X 2) w Reflex to PCR ID Panel     Status: None   Collection Time: 09/22/15 11:56 PM  Result Value Ref Range Status   Specimen Description BLOOD LEFT HAND  Final   Special Requests BOTTLES DRAWN AEROBIC AND ANAEROBIC  Final   Culture NO GROWTH 5 DAYS  Final   Report Status 09/28/2015 FINAL  Final  Culture, respiratory (NON-Expectorated)     Status: None   Collection Time: 09/23/15 12:01 AM  Result Value Ref Range Status   Specimen Description TRACHEAL ASPIRATE  Final   Special Requests NONE  Final   Gram Stain   Final    GOOD SPECIMEN - 80-90% WBCS MODERATE WBC SEEN RARE YEAST RARE GRAM POSITIVE COCCI    Culture Consistent with normal respiratory flora.  Final   Report Status 09/25/2015 FINAL  Final  Urine culture     Status: None   Collection Time: 10/01/15 10:35 AM  Result Value Ref Range Status   Specimen Description URINE, RANDOM  Final   Special Requests NONE  Final   Culture NO GROWTH 2 DAYS  Final   Report Status 10/03/2015 FINAL  Final  CULTURE, BLOOD (ROUTINE X 2) w Reflex to PCR ID Panel     Status: None   Collection Time: 10/01/15 10:56 AM  Result Value Ref Range Status   Specimen Description BLOOD RIGHT ARM  Final   Special Requests   Final    BOTTLES DRAWN AEROBIC AND ANAEROBIC  AER 3CC ANA 5CC   Culture NO GROWTH 5 DAYS  Final   Report Status 10/06/2015 FINAL  Final  CULTURE, BLOOD (ROUTINE X 2) w Reflex to PCR ID  Panel     Status: None   Collection Time: 10/01/15 11:14 AM  Result Value Ref Range Status   Specimen Description BLOOD LEFT HAND  Final   Special Requests   Final    BOTTLES DRAWN AEROBIC AND ANAEROBIC  AER 3CC ANA 4CC   Culture  Setup Time   Final    GRAM POSITIVE COCCI ANAEROBIC BOTTLE ONLY CRITICAL RESULT CALLED TO, READ BACK BY AND VERIFIED WITH: CRYSTAL SCARPENA AT 0915 ON 10/02/15 CTJ    Culture   Final    COAGULASE NEGATIVE STAPHYLOCOCCUS ANAEROBIC BOTTLE ONLY Results consistent with contamination.    Report Status 10/06/2015 FINAL  Final  Blood Culture ID Panel (Reflexed)     Status: Abnormal   Collection Time: 10/01/15 11:14 AM  Result Value Ref Range  Status   Enterococcus species NOT DETECTED NOT DETECTED Final   Listeria monocytogenes NOT DETECTED NOT DETECTED Final   Staphylococcus species DETECTED (A) NOT DETECTED Final   Staphylococcus aureus NOT DETECTED NOT DETECTED Final   Streptococcus species NOT DETECTED NOT DETECTED Final   Streptococcus agalactiae NOT DETECTED NOT DETECTED Final   Streptococcus pneumoniae NOT DETECTED NOT DETECTED Final   Streptococcus pyogenes NOT DETECTED NOT DETECTED Final   Acinetobacter baumannii NOT DETECTED NOT DETECTED Final   Enterobacteriaceae species NOT DETECTED NOT DETECTED Final   Enterobacter cloacae complex NOT DETECTED NOT DETECTED Final   Escherichia coli NOT DETECTED NOT DETECTED Final   Klebsiella oxytoca NOT DETECTED NOT DETECTED Final   Klebsiella pneumoniae NOT DETECTED NOT DETECTED Final   Proteus species NOT DETECTED NOT DETECTED Final   Serratia marcescens NOT DETECTED NOT DETECTED Final   Haemophilus influenzae NOT DETECTED NOT DETECTED Final   Neisseria meningitidis NOT DETECTED NOT DETECTED Final   Pseudomonas aeruginosa NOT DETECTED NOT DETECTED Final   Candida albicans NOT DETECTED NOT DETECTED Final   Candida glabrata NOT DETECTED NOT DETECTED Final   Candida krusei NOT DETECTED NOT DETECTED Final    Candida parapsilosis NOT DETECTED NOT DETECTED Final   Candida tropicalis NOT DETECTED NOT DETECTED Final   Carbapenem resistance NOT DETECTED NOT DETECTED Final   Methicillin resistance DETECTED (A) NOT DETECTED Final    Comment: CRITICAL RESULT CALLED TO, READ BACK BY AND VERIFIED WITH: CRYSTAL SCARPENA FOR STAPHYLOCOCCUS SPECIES AND METHICILLIN RESISTANCE AT 0915 ON 10/02/15 CTJ    Vancomycin resistance NOT DETECTED NOT DETECTED Final  Culture, expectorated sputum-assessment     Status: None   Collection Time: 10/01/15 11:51 AM  Result Value Ref Range Status   Specimen Description EXPECTORATED SPUTUM  Final   Special Requests Normal  Final   Sputum evaluation THIS SPECIMEN IS ACCEPTABLE FOR SPUTUM CULTURE  Final   Report Status 10/01/2015 FINAL  Final  Culture, respiratory (NON-Expectorated)     Status: None   Collection Time: 10/01/15 11:51 AM  Result Value Ref Range Status   Specimen Description EXPECTORATED SPUTUM  Final   Special Requests Normal Reflexed from [id number]  Final   Gram Stain   Final    GOOD SPECIMEN - 80-90% WBCS MODERATE WBC SEEN FEW YEAST    Culture   Final    LIGHT GROWTH CANDIDA ALBICANS LIGHT GROWTH CANDIDA DUBLINIENSIS    Report Status 10/06/2015 FINAL  Final  Culture, expectorated sputum-assessment     Status: None   Collection Time: 10/08/15  3:30 PM  Result Value Ref Range Status   Specimen Description TRACHEAL ASPIRATE  Final   Special Requests NONE  Final   Sputum evaluation THIS SPECIMEN IS ACCEPTABLE FOR SPUTUM CULTURE  Final   Report Status 10/08/2015 FINAL  Final  Culture, respiratory (NON-Expectorated)     Status: None   Collection Time: 10/08/15  3:30 PM  Result Value Ref Range Status   Specimen Description TRACHEAL ASPIRATE  Final   Special Requests NONE Reflexed from [id number]  Final   Gram Stain   Final    FAIR SPECIMEN - 70-80% WBCS FEW WBC SEEN RARE GRAM POSITIVE COCCI RARE GRAM NEGATIVE RODS    Culture Consistent with normal  respiratory flora.  Final   Report Status 10/11/2015 FINAL  Final  Culture, bal-quantitative     Status: None   Collection Time: 10/12/15 12:16 PM  Result Value Ref Range Status   Specimen Description BRONCHIAL ALVEOLAR LAVAGE  Final  Special Requests Immunocompromised  Final   Gram Stain   Final    FEW WBC SEEN FEW GRAM POSITIVE COCCI FAIR SPECIMEN - 70-80% WBCS    Culture   Final    RARE GROWTH CANDIDA ALBICANS RARE GROWTH CANDIDA DUBLINIENSIS RARE GROWTH CANDIDA TROPICALIS    Report Status 10/17/2015 FINAL  Final  CULTURE, BLOOD (ROUTINE X 2) w Reflex to PCR ID Panel     Status: None   Collection Time: 10/12/15  3:59 PM  Result Value Ref Range Status   Specimen Description BLOOD RIGHT ARM  Final   Special Requests   Final    BOTTLES DRAWN AEROBIC AND ANAEROBIC 7CC AERO 9CC ANA   Culture NO GROWTH 5 DAYS  Final   Report Status 10/17/2015 FINAL  Final  CULTURE, BLOOD (ROUTINE X 2) w Reflex to PCR ID Panel     Status: None   Collection Time: 10/12/15  4:07 PM  Result Value Ref Range Status   Specimen Description BLOOD LEFT HAND  Final   Special Requests   Final    BOTTLES DRAWN AEROBIC AND ANAEROBIC 7CC AERO 9CC ANA   Culture  Setup Time   Final    GRAM POSITIVE COCCI AEROBIC BOTTLE ONLY CRITICAL RESULT CALLED TO, READ BACK BY AND VERIFIED WITH: MATT MCBANE AT 4098 10/14/15 DV    Culture   Final    COAGULASE NEGATIVE STAPHYLOCOCCUS AEROBIC BOTTLE ONLY Results consistent with contamination.    Report Status 10/17/2015 FINAL  Final  Blood Culture ID Panel (Reflexed)     Status: Abnormal   Collection Time: 10/12/15  4:07 PM  Result Value Ref Range Status   Enterococcus species NOT DETECTED NOT DETECTED Final   Listeria monocytogenes NOT DETECTED NOT DETECTED Final   Staphylococcus species DETECTED (A) NOT DETECTED Final    Comment: CRITICAL RESULT CALLED TO, READ BACK BY AND VERIFIED WITH: MATT MCBANE AT 1191 10/14/15 DV    Staphylococcus aureus NOT DETECTED NOT  DETECTED Final   Streptococcus species NOT DETECTED NOT DETECTED Final   Streptococcus agalactiae NOT DETECTED NOT DETECTED Final   Streptococcus pneumoniae NOT DETECTED NOT DETECTED Final   Streptococcus pyogenes NOT DETECTED NOT DETECTED Final   Acinetobacter baumannii NOT DETECTED NOT DETECTED Final   Enterobacteriaceae species NOT DETECTED NOT DETECTED Final   Enterobacter cloacae complex NOT DETECTED NOT DETECTED Final   Escherichia coli NOT DETECTED NOT DETECTED Final   Klebsiella oxytoca NOT DETECTED NOT DETECTED Final   Klebsiella pneumoniae NOT DETECTED NOT DETECTED Final   Proteus species NOT DETECTED NOT DETECTED Final   Serratia marcescens NOT DETECTED NOT DETECTED Final   Haemophilus influenzae NOT DETECTED NOT DETECTED Final   Neisseria meningitidis NOT DETECTED NOT DETECTED Final   Pseudomonas aeruginosa NOT DETECTED NOT DETECTED Final   Candida albicans NOT DETECTED NOT DETECTED Final   Candida glabrata NOT DETECTED NOT DETECTED Final   Candida krusei NOT DETECTED NOT DETECTED Final   Candida parapsilosis NOT DETECTED NOT DETECTED Final   Candida tropicalis NOT DETECTED NOT DETECTED Final   Carbapenem resistance NOT DETECTED NOT DETECTED Final   Methicillin resistance DETECTED (A) NOT DETECTED Final    Comment: CRITICAL RESULT CALLED TO, READ BACK BY AND VERIFIED WITH: MATT MCBANE AT 0640 10/14/15 DV    Vancomycin resistance NOT DETECTED NOT DETECTED Final    Coagulation Studies:  Recent Labs  10/15/15 1306 09/27/2015 0506  LABPROT 22.4* 43.2*  INR 1.98 4.74*    Urinalysis: No results for input(s):  COLORURINE, LABSPEC, PHURINE, GLUCOSEU, HGBUR, BILIRUBINUR, KETONESUR, PROTEINUR, UROBILINOGEN, NITRITE, LEUKOCYTESUR in the last 72 hours.  Invalid input(s): APPERANCEUR    Imaging: Dg Chest Port 1 View  10/16/2015  CLINICAL DATA:  PICC line placement. EXAM: PORTABLE CHEST 1 VIEW COMPARISON:  10/16/2015, earlier the same day. FINDINGS: 1509 hours. Low volume  film. Patient rotated to the left. X-ray is under penetrated. The right PICC line has been repositioned in the interval no longer courses cranially in the neck. The tip of the right PICC line is not well visualized and may be positioned the level of the proximal SVC, but superimposition of cardiomediastinal tissues makes precise tip localization difficult. The right IJ catheter remains in place. Left IJ catheter is again no. Tracheostomy tube again noted. There is an NG tube visualized although it becomes obscure over the mediastinum. Lung volumes are low. There is vascular congestion with probable associated pulmonary edema and left base collapse/ consolidation. Telemetry leads overlie the chest. IMPRESSION: Right PICC line has been repositioned in the interval and is now directed centrally although the tip cannot be confidently visualized secondary to obscuration by cardiomediastinal tissues. Otherwise stable exam. Electronically Signed   By: Kennith Center M.D.   On: 10/16/2015 15:28   Dg Chest Port 1 View  10/16/2015  CLINICAL DATA:  40 year old male with PICC line placement. Respiratory failure. EXAM: PORTABLE CHEST 1 VIEW COMPARISON:  10/14/2015 and prior exams FINDINGS: A right PICC line is identified extending cephalad in the neck with tip overlying the mid -upper right neck. Tracheostomy tube, bilateral IJ central venous catheters and NG tube again noted. Cardiomegaly with pulmonary vascular congestion, pulmonary edema and left lower lung consolidation/atelectasis again noted. There is no evidence of pneumothorax. IMPRESSION: Right PICC line extending cephalad into the neck with tip overlying the mid -upper right neck. Recommend repositioning. Otherwise no significant change. Cardiomegaly, pulmonary vascular congestion, edema and left lower lung consolidation/atelectasis. This was discussed with Charli, R.N., at 3:05 p.m. on 10/16/2015. Electronically Signed   By: Harmon Pier M.D.   On: 10/16/2015 15:07      Medications:   . argatroban 0.0625 mcg/kg/min (10/17/15 0700)  . feeding supplement (VITAL HIGH PROTEIN) 1,000 mL (10/17/15 2131)  . norepinephrine (LEVOPHED) Adult infusion 40 mcg/min (10/07/2015 0847)  . pureflow 3 each (10/06/2015 0536)  . vasopressin (PITRESSIN) infusion - *FOR SHOCK* 0.03 Units/min (10/11/2015 0806)   . ALPRAZolam  0.5 mg Oral TID  . alteplase  2 mg Intracatheter Once  . alteplase  2 mg Intracatheter Once  . amiodarone  200 mg Per Tube BID  . antiseptic oral rinse  7 mL Mouth Rinse 10 times per day  . aspirin  325 mg Oral Daily  . budesonide  0.25 mg Nebulization 4 times per day  . chlorhexidine gluconate  15 mL Mouth Rinse BID  . feeding supplement (PRO-STAT SUGAR FREE 64)  30 mL Per Tube TID  . free water  30 mL Per Tube 6 times per day  . Influenza vac split quadrivalent PF  0.5 mL Intramuscular Tomorrow-1000  . ipratropium-albuterol  3 mL Nebulization Q6H  . lactulose  30 g Per Tube BID  . magnesium sulfate 1 - 4 g bolus IVPB  2 g Intravenous Once  . meropenem (MERREM) IV  1 g Intravenous Q12H  . nystatin   Topical BID  . pantoprazole sodium  40 mg Per Tube BID  . senna-docusate  1 tablet Oral BID  . vancomycin  1,500 mg Intravenous Q24H  acetaminophen **OR** acetaminophen, albuterol, fentaNYL (SUBLIMAZE) injection, metoprolol, midazolam, ondansetron **OR** ondansetron (ZOFRAN) IV, sodium chloride  Assessment/ Plan:  Mr. Mitchell Morrow is a 40 y.o. black male with morbid obesity, tobacco abuse, without prescribed home medications, who was admitted to [facility] on 09/01/2015, s/p tracheostomy placement, persistent respiratory failure, multiple episodes of ARF  1. Acute Renal Failure:  acute renal failure anuric. Baseline creatinine of 1.15 on admission. Started CRRT 10/06/15, trial of intermittent hemodialysis 1/24. Restarted 1/25 CRRT.   - Continue CRRT. Patient hemodynamically unstable. anuric. No changes - Continue to monitor volume status, urine output,  serum electrolytes and renal function.  - Renally dose all medications - Creatinine not reported due to extremely high Bilirubin   2. Acute hypercapnic respiratory failure with bilateral pneumonia -  With underlying obesity hypoventilation syndrome. Tracheostomy 2015-10-27. Ventilator dependent.    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   4. Hypotension/sepsis: on vasopressors: norepinephrine.  - merepenem and vancomycin   Overall prognosis is quite poor.    LOS: 31 Mitchell Morrow 1/30/20179:11 AM

## 2015-10-20 NOTE — Progress Notes (Signed)
RN made Dr. Sheryle Hail aware that patient's INR result is 4.74 and patient is on argatroban and total bilirubin is 25.5, MD acknowledged and gave no new orders.

## 2015-10-20 NOTE — Progress Notes (Signed)
Nutrition Follow-up    INTERVENTION:   EN: continue TF as tolerated by pt at current goal rate, continue to assess   NUTRITION DIAGNOSIS:   Inadequate oral intake related to inability to eat as evidenced by NPO status.  GOAL:   Patient will meet greater than or equal to 90% of their needs  MONITOR:    (Energy Intake, Digestive System, Pulmonary Profile, Electrolyte and Renal Profile, Anthropometrics)  REASON FOR ASSESSMENT:   Ventilator, Consult Enteral/tube feeding initiation and management  ASSESSMENT:   Pt remains critically ill in multi organ failure on vent via trach, on levophed and vasopressin, on CRRT. Poor prognosis, palliative care to meet with family, pt now DNR, possible comfort care per MD notes  EN: tolerating Vital High Protein at rate of 60 ml/hr, Prostat  Digestive system: no signs of TF intolerance    Skin:  Reviewed, no issues  Last BM:  1/23  Electrolyte and Renal Profile:  Recent Labs Lab 10/16/15 0140  10/17/15 0415  10/17/15 1708 10/17/15 2157 10/12/2015 0441 10/14/2015 1013  BUN 66*  < > 58*  < > 57* 57* 56* 50*  CREATININE 1.56*  < > 1.21  < > UNABLE TO REPORT DUE TO ICTERUS UNABLE TO REPORT DUE TO ICTERUS UNABLE TO REPORT DUE TO ICTERUS 0.80  NA 137  < > 137  < > 137 138 139 136  K 3.8  < > 3.6  < > 3.4* 3.4* 3.5 3.1*  MG 1.9  --  1.9  --   --   --  1.8  --   PHOS UNABLE TO REPORT DUE TO ICTERUS  < > UNABLE TO REPORT DUE TO ICTERUS  < > UNABLE TO REPORT DUE TO ICTERUS UNABLE TO REPORT DUE TO ICTERUS UNABLE TO REPORT DUE TO ICTERUS  --   < > = values in this interval not displayed. Glucose Profile:  Recent Labs  10/17/15 2359 10/10/2015 0822 10/03/2015 1127  GLUCAP 125* 108* 126*   Meds: reviewed  Height:   Ht Readings from Last 1 Encounters:  09/20/15  (1.753 m)    Weight:   Wt Readings from Last 1 Encounters:  10/09/2015 615 lb 15.4 oz (279.4 kg)    BMI:  Body mass index is 90.92 kg/(m^2).  Estimated Nutritional  Needs:   Kcal:  1600-1817kcals (22-25kcals/kg) using  IBW of 72.7kg  Protein:  145-181g protein (2.0-2.5g/kg) using IBW of 72.7kg  Fluid:  2181-2562mL of fluid (30-34mL/kg)  EDUCATION NEEDS:   No education needs identified at this time  HIGH Care Level  Romelle Starcher MS, RD, LDN [phone] Pager  [phone] Weekend/On-Call Pager

## 2015-10-20 NOTE — Progress Notes (Signed)
Notified Mitchell Morrow, pts. Mother of death. She does not know what funeral home she is going to use.

## 2015-10-20 NOTE — Progress Notes (Signed)
Pt having runs of vfib. Notified Mungal. Electrolyte levels drawn.

## 2015-10-20 NOTE — Progress Notes (Signed)
[facility] [place] NAME: Mitchell Morrow    MR#:  [id number]  DATE OF BIRTH:  03-Aug-1976  SUBJECTIVE:  Tolerating CRRT but remains anuric/oluguric.  O2 requirements still quite high at 70%.  Now on IV levophed, Vasopressin with multi-organ failure and critically ill.     REVIEW OF SYSTEMS:   Review of Systems  Unable to perform ROS: intubated   DRUG ALLERGIES:  No Known Allergies  VITALS:  Blood pressure 78/52, pulse 111, temperature 98.1 F (36.7 C), temperature source Oral, resp. rate 27, height _0  (1.753 m), weight 279.4 kg (615 lb 15.4 oz), SpO2 94 %.  PHYSICAL EXAMINATION:   Physical Exam  Constitutional: He is well-developed, well-nourished, and in no distress. No distress.  Morbidly obese  HENT:  Head: Normocephalic.  Eyes: No scleral icterus.  Neck: Neck supple. No JVD present. No tracheal deviation present.  Tracheostomy placed  Cardiovascular: Normal rate, regular rhythm and normal heart sounds.  Exam reveals no gallop and no friction rub.   No murmur heard. Pulmonary/Chest: Effort normal and breath sounds normal. No respiratory distress. He has no wheezes. He has no rales. He exhibits no tenderness.  Abdominal: Soft. Bowel sounds are normal. He exhibits no distension and no mass. There is no tenderness. There is no rebound and no guarding.  Musculoskeletal: Normal range of motion. He exhibits edema.  Neurological:  Eyes open but patient does not follow commands  Skin: Skin is warm. No rash noted. No erythema.       LABORATORY PANEL:   CBC  Recent Labs Lab 11-09-15 0441  WBC 31.6*  HGB 10.0*  HCT 32.9*  PLT 124*   ------------------------------------------------------------------------------------------------------------------  Chemistries   Recent Labs Lab 2015/11/09 0441 Nov 09, 2015 1013  NA 139 136  K 3.5 3.1*  CL 103 102  CO2 29 27  GLUCOSE 138* 250*  BUN 56* 50*  CREATININE UNABLE TO  REPORT DUE TO ICTERUS 0.80  CALCIUM 8.6* 7.7*  MG 1.8  --   AST 917*  --   ALT 1045*  --   ALKPHOS 148*  --   BILITOT 25.5*  --     RADIOLOGY:  Dg Chest Port 1 View  10/16/2015  CLINICAL DATA:  PICC line placement. EXAM: PORTABLE CHEST 1 VIEW COMPARISON:  10/16/2015, earlier the same day. FINDINGS: 1509 hours. Low volume film. Patient rotated to the left. X-ray is under penetrated. The right PICC line has been repositioned in the interval no longer courses cranially in the neck. The tip of the right PICC line is not well visualized and may be positioned the level of the proximal SVC, but superimposition of cardiomediastinal tissues makes precise tip localization difficult. The right IJ catheter remains in place. Left IJ catheter is again no. Tracheostomy tube again noted. There is an NG tube visualized although it becomes obscure over the mediastinum. Lung volumes are low. There is vascular congestion with probable associated pulmonary edema and left base collapse/ consolidation. Telemetry leads overlie the chest. IMPRESSION: Right PICC line has been repositioned in the interval and is now directed centrally although the tip cannot be confidently visualized secondary to obscuration by cardiomediastinal tissues. Otherwise stable exam. Electronically Signed   By: Misty Stanley M.D.   On: 10/16/2015 15:28    ASSESSMENT AND PLAN:   40 year old male with a history of morbid obesity and tobacco abuse who presented with lower extremity edema and found to have bilateral pneumonia.  1. Acute hypoxic  hypercapnic respiratory failure-:  -multifactorial  due to obesity hypoventilation syndrome, community-acquired pneumonia and pulmonary edema, ARDS.   - Patient remains Intubated and s/p Tracheostomy done on 10/13/2015. Continue vent management as per pulmonary and very difficult to wean. - Patient had completed course of antibiotics for treatment of pneumonia with vancomycin and CEFTAZ but spiked fever on  1/25 and started back on Vanc and meropenem day 3 today.   - BC (PCR) from 10/12/15 Positive for MRSA  2. Septic Shock - due to pneumonia presumably.   - cont. IV Vanco, Meropenem.  - cont. IV Levophed, Vasopressin. Keep MAP > 60.    3.New onset systolic congestive heart failure,  EF 45% :  - Continue CRRT  4. Acute renal failure - Due to ATN and septic shock.  Due to persistent oliguria and hemodynamic instability patient was started on  CRRT on 10/06/15 and tolerating it for now.  Hemodialysis was attempted however patient did not tolerate this well    5. Nutrition-Currently has an NG tube with tube feeds. Needs a PEG tube but due to his large body habitus,GI cannot do it endoscopically and as per IR team, patient will be too heavy for a CT table and also the fluoroscopic table due to his weight. So cannot be done by interventional radiology. Surgery services also say they cannot place the PEG tube. Patient will need to have an open procedure for his PEG tube placement by surgical team, however will be a high risk procedure considering his body habitus and also his other multiple medical problems. - we have tried contacting tertiary care centers at [place] but they have refused to do it due to his comorbidities and poor functional status  6. Hyperbilirubinemia:due to chronic passive congestion from congestive heart failure. Bili's getting worse.  - hepatitis panel (-) and cannot do any diagnostic tests due to his morbid obesity.  - follow LFT's/    7. Atrial fibrillation - known history of paroxysmal atrial fibrillation. Cont. Amio.  - high Risk of long-term full dose anticoagulation due to thrombocytopenia.   8. Anemia of chronic disease and illness: Continue to monitor hemoglobin. - No indication for blood transfusion  9. 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  - cont. To avoid heparin. Cont. Argatroban.  Follow Plt. Count.   Dr. Mortimer Fries met with family 1/28 and had  discussion with them about his poor prognosis and outcome. He was made DNR.  Family is leaning towards comfort care and this is to be further addressed w/ Palliative Care.     CODE STATUS: DNR  TOTAL CRITICAL CARE TIME SPENT IN TAKING CARE OF THIS PATIENT: 30 minutes.     Henreitta Leber M.D on 10/19/15   Between 7am to 6pm - Pager - [phone]  After 6pm go to [email or web address] - password [email or web address] [facility]  [facility]  Office  [phone]  CC: Primary care physician; No PCP Per Patient  Note: This dictation was prepared with Dragon dictation along with smaller phrase technology. Any transcriptional errors that result from this process are unintentional.

## 2015-10-20 NOTE — Progress Notes (Signed)
PHARMACY - CRITICAL CARE PROGRESS NOTE  Pharmacy Consult for Meropenem, Vancomycin, CRRT medication management, Constipation Prevention, Electrolyte Management      No Known Allergies  Patient Measurements: Height: 5\' 9"  (175.3 cm) Weight: (!) 615 lb 15.4 oz (279.4 kg) IBW/kg (Calculated) : 70.7   Vital Signs: Temp: 98.1 F (36.7 C) (01/30 0734) Temp Source: Oral (01/30 0600) BP: 85/48 mmHg (01/30 1515) Pulse Rate: 112 (01/30 1515) Intake/Output from previous day: 01/29 0701 - 01/30 0700 In: 3149.2 [I.V.:649.2; NG/GT:1800; IV Piggyback:700] Out: 1114  Intake/Output from this shift: Total I/O In: 1437.4 [I.V.:357.4; NG/GT:480; IV Piggyback:600] Out: 385 [Other:385] Vent settings for last 24 hours: Vent Mode:  [-] PRVC FiO2 (%):  [100 %] 100 % Set Rate:  [30 bmp] 30 bmp Vt Set:  [550 mL] 550 mL PEEP:  [8 cmH20] 8 cmH20  Labs:  Recent Labs  10/16/15 0140  10/16/15 2220 10/17/15 0415 10/17/15 1027 10/17/15 1708 10/17/15 2157 11-08-15 0441 2015/11/08 0506 11/08/15 1013  WBC 21.4*  --   --  26.3*  --   --   --  31.6*  --   --   HGB 10.0*  --   --  9.7*  --   --   --  10.0*  --   --   HCT 32.7*  --   --  32.0*  --   --   --  32.9*  --   --   PLT 82*  --   --  97*  --   --   --  124*  --   --   APTT 156*  < > 128* 134*  --   --   --   --  116*  --   INR  --   --   --   --   --   --   --   --  4.74*  --   CREATININE 1.56*  < > 1.31* 1.21 UNABLE TO REPORT DUE TO ICTERUS UNABLE TO REPORT DUE TO ICTERUS UNABLE TO REPORT DUE TO ICTERUS UNABLE TO REPORT DUE TO ICTERUS  --  0.80  MG 1.9  --   --  1.9  --   --   --  1.8  --   --   PHOS UNABLE TO REPORT DUE TO ICTERUS  < > UNABLE TO REPORT DUE TO ICTERUS UNABLE TO REPORT DUE TO ICTERUS UNABLE TO REPORT DUE TO ICTERUS UNABLE TO REPORT DUE TO ICTERUS UNABLE TO REPORT DUE TO ICTERUS UNABLE TO REPORT DUE TO ICTERUS  --   --   ALBUMIN 2.3*  < > 2.2* 2.2* 2.1*  2.1* 2.2* 2.1* 2.1*  --  1.9*  PROT 6.5  --   --   --  6.2*  --   --   6.1*  --   --   AST 2273*  --   --   --  1152*  --   --  917*  --   --   ALT 1310*  --   --   --  1173*  --   --  1045*  --   --   ALKPHOS 164*  --   --   --  155*  --   --  148*  --   --   BILITOT 19.1*  --   --   --  23.4*  --   --  25.5*  --   --   BILIDIR  --   --   --   --  16.2*  --   --   --   --   --   IBILI  --   --   --   --  7.2*  --   --   --   --   --   < > = values in this interval not displayed. Estimated Creatinine Clearance: 265 mL/min (by C-G formula based on Cr of 0.8).   Recent Labs  10/17/15 2359 10/06/2015 0822 09/20/2015 1127  GLUCAP 125* 108* 126*    Microbiology: Recent Results (from the past 720 hour(s))  MRSA PCR Screening     Status: None   Collection Time: 09/20/15  6:30 AM  Result Value Ref Range Status   MRSA by PCR NEGATIVE NEGATIVE Final    Comment:        The GeneXpert MRSA Assay (FDA approved for NASAL specimens only), is one component of a comprehensive MRSA colonization surveillance program. It is not intended to diagnose MRSA infection nor to guide or monitor treatment for MRSA infections.   Culture, respiratory (NON-Expectorated)     Status: None   Collection Time: 09/22/15  3:13 PM  Result Value Ref Range Status   Specimen Description TRACHEAL ASPIRATE  Final   Special Requests NONE  Final   Gram Stain   Final    GOOD SPECIMEN - 80-90% WBCS MODERATE WBC SEEN RARE YEAST RARE GRAM NEGATIVE COCCOBACILLI    Culture LIGHT GROWTH CANDIDA TROPICALIS  Final   Report Status 09/26/2015 FINAL  Final  Urine culture     Status: None   Collection Time: 09/22/15 11:16 PM  Result Value Ref Range Status   Specimen Description URINE, RANDOM  Final   Special Requests NONE  Final   Culture NO GROWTH 2 DAYS  Final   Report Status 09/24/2015 FINAL  Final  CULTURE, BLOOD (ROUTINE X 2) w Reflex to PCR ID Panel     Status: None   Collection Time: 09/22/15 11:44 PM  Result Value Ref Range Status   Specimen Description BLOOD RIGHT ANTECUBITAL  Final    Special Requests BOTTLES DRAWN AEROBIC AND ANAEROBIC  Final   Culture NO GROWTH 5 DAYS  Final   Report Status 09/28/2015 FINAL  Final  CULTURE, BLOOD (ROUTINE X 2) w Reflex to PCR ID Panel     Status: None   Collection Time: 09/22/15 11:56 PM  Result Value Ref Range Status   Specimen Description BLOOD LEFT HAND  Final   Special Requests BOTTLES DRAWN AEROBIC AND ANAEROBIC  Final   Culture NO GROWTH 5 DAYS  Final   Report Status 09/28/2015 FINAL  Final  Culture, respiratory (NON-Expectorated)     Status: None   Collection Time: 09/23/15 12:01 AM  Result Value Ref Range Status   Specimen Description TRACHEAL ASPIRATE  Final   Special Requests NONE  Final   Gram Stain   Final    GOOD SPECIMEN - 80-90% WBCS MODERATE WBC SEEN RARE YEAST RARE GRAM POSITIVE COCCI    Culture Consistent with normal respiratory flora.  Final   Report Status 09/25/2015 FINAL  Final  Urine culture     Status: None   Collection Time: 10/01/15 10:35 AM  Result Value Ref Range Status   Specimen Description URINE, RANDOM  Final   Special Requests NONE  Final   Culture NO GROWTH 2 DAYS  Final   Report Status 10/03/2015 FINAL  Final  CULTURE, BLOOD (ROUTINE X 2) w Reflex to PCR ID Panel  Status: None   Collection Time: 10/01/15 10:56 AM  Result Value Ref Range Status   Specimen Description BLOOD RIGHT ARM  Final   Special Requests   Final    BOTTLES DRAWN AEROBIC AND ANAEROBIC  AER 3CC ANA 5CC   Culture NO GROWTH 5 DAYS  Final   Report Status 10/06/2015 FINAL  Final  CULTURE, BLOOD (ROUTINE X 2) w Reflex to PCR ID Panel     Status: None   Collection Time: 10/01/15 11:14 AM  Result Value Ref Range Status   Specimen Description BLOOD LEFT HAND  Final   Special Requests   Final    BOTTLES DRAWN AEROBIC AND ANAEROBIC  AER 3CC ANA 4CC   Culture  Setup Time   Final    GRAM POSITIVE COCCI ANAEROBIC BOTTLE ONLY CRITICAL RESULT CALLED TO, READ BACK BY AND VERIFIED WITH: CRYSTAL SCARPENA AT 0915 ON  10/02/15 CTJ    Culture   Final    COAGULASE NEGATIVE STAPHYLOCOCCUS ANAEROBIC BOTTLE ONLY Results consistent with contamination.    Report Status 10/06/2015 FINAL  Final  Blood Culture ID Panel (Reflexed)     Status: Abnormal   Collection Time: 10/01/15 11:14 AM  Result Value Ref Range Status   Enterococcus species NOT DETECTED NOT DETECTED Final   Listeria monocytogenes NOT DETECTED NOT DETECTED Final   Staphylococcus species DETECTED (A) NOT DETECTED Final   Staphylococcus aureus NOT DETECTED NOT DETECTED Final   Streptococcus species NOT DETECTED NOT DETECTED Final   Streptococcus agalactiae NOT DETECTED NOT DETECTED Final   Streptococcus pneumoniae NOT DETECTED NOT DETECTED Final   Streptococcus pyogenes NOT DETECTED NOT DETECTED Final   Acinetobacter baumannii NOT DETECTED NOT DETECTED Final   Enterobacteriaceae species NOT DETECTED NOT DETECTED Final   Enterobacter cloacae complex NOT DETECTED NOT DETECTED Final   Escherichia coli NOT DETECTED NOT DETECTED Final   Klebsiella oxytoca NOT DETECTED NOT DETECTED Final   Klebsiella pneumoniae NOT DETECTED NOT DETECTED Final   Proteus species NOT DETECTED NOT DETECTED Final   Serratia marcescens NOT DETECTED NOT DETECTED Final   Haemophilus influenzae NOT DETECTED NOT DETECTED Final   Neisseria meningitidis NOT DETECTED NOT DETECTED Final   Pseudomonas aeruginosa NOT DETECTED NOT DETECTED Final   Candida albicans NOT DETECTED NOT DETECTED Final   Candida glabrata NOT DETECTED NOT DETECTED Final   Candida krusei NOT DETECTED NOT DETECTED Final   Candida parapsilosis NOT DETECTED NOT DETECTED Final   Candida tropicalis NOT DETECTED NOT DETECTED Final   Carbapenem resistance NOT DETECTED NOT DETECTED Final   Methicillin resistance DETECTED (A) NOT DETECTED Final    Comment: CRITICAL RESULT CALLED TO, READ BACK BY AND VERIFIED WITH: CRYSTAL SCARPENA FOR STAPHYLOCOCCUS SPECIES AND METHICILLIN RESISTANCE AT 0915 ON 10/02/15 CTJ     Vancomycin resistance NOT DETECTED NOT DETECTED Final  Culture, expectorated sputum-assessment     Status: None   Collection Time: 10/01/15 11:51 AM  Result Value Ref Range Status   Specimen Description EXPECTORATED SPUTUM  Final   Special Requests Normal  Final   Sputum evaluation THIS SPECIMEN IS ACCEPTABLE FOR SPUTUM CULTURE  Final   Report Status 10/01/2015 FINAL  Final  Culture, respiratory (NON-Expectorated)     Status: None   Collection Time: 10/01/15 11:51 AM  Result Value Ref Range Status   Specimen Description EXPECTORATED SPUTUM  Final   Special Requests Normal Reflexed from [id number]  Final   Gram Stain   Final    GOOD SPECIMEN - 80-90% WBCS MODERATE  WBC SEEN FEW YEAST    Culture   Final    LIGHT GROWTH CANDIDA ALBICANS LIGHT GROWTH CANDIDA DUBLINIENSIS    Report Status 10/06/2015 FINAL  Final  Culture, expectorated sputum-assessment     Status: None   Collection Time: 10/08/15  3:30 PM  Result Value Ref Range Status   Specimen Description TRACHEAL ASPIRATE  Final   Special Requests NONE  Final   Sputum evaluation THIS SPECIMEN IS ACCEPTABLE FOR SPUTUM CULTURE  Final   Report Status 10/08/2015 FINAL  Final  Culture, respiratory (NON-Expectorated)     Status: None   Collection Time: 10/08/15  3:30 PM  Result Value Ref Range Status   Specimen Description TRACHEAL ASPIRATE  Final   Special Requests NONE Reflexed from [id number]  Final   Gram Stain   Final    FAIR SPECIMEN - 70-80% WBCS FEW WBC SEEN RARE GRAM POSITIVE COCCI RARE GRAM NEGATIVE RODS    Culture Consistent with normal respiratory flora.  Final   Report Status 10/11/2015 FINAL  Final  Culture, bal-quantitative     Status: None   Collection Time: 10/12/15 12:16 PM  Result Value Ref Range Status   Specimen Description BRONCHIAL ALVEOLAR LAVAGE  Final   Special Requests Immunocompromised  Final   Gram Stain   Final    FEW WBC SEEN FEW GRAM POSITIVE COCCI FAIR SPECIMEN - 70-80% WBCS    Culture   Final     RARE GROWTH CANDIDA ALBICANS RARE GROWTH CANDIDA DUBLINIENSIS RARE GROWTH CANDIDA TROPICALIS    Report Status 10/17/2015 FINAL  Final  CULTURE, BLOOD (ROUTINE X 2) w Reflex to PCR ID Panel     Status: None   Collection Time: 10/12/15  3:59 PM  Result Value Ref Range Status   Specimen Description BLOOD RIGHT ARM  Final   Special Requests   Final    BOTTLES DRAWN AEROBIC AND ANAEROBIC 7CC AERO 9CC ANA   Culture NO GROWTH 5 DAYS  Final   Report Status 10/17/2015 FINAL  Final  CULTURE, BLOOD (ROUTINE X 2) w Reflex to PCR ID Panel     Status: None   Collection Time: 10/12/15  4:07 PM  Result Value Ref Range Status   Specimen Description BLOOD LEFT HAND  Final   Special Requests   Final    BOTTLES DRAWN AEROBIC AND ANAEROBIC 7CC AERO 9CC ANA   Culture  Setup Time   Final    GRAM POSITIVE COCCI AEROBIC BOTTLE ONLY CRITICAL RESULT CALLED TO, READ BACK BY AND VERIFIED WITH: MATT MCBANE AT 0347 10/14/15 DV    Culture   Final    COAGULASE NEGATIVE STAPHYLOCOCCUS AEROBIC BOTTLE ONLY Results consistent with contamination.    Report Status 10/17/2015 FINAL  Final  Blood Culture ID Panel (Reflexed)     Status: Abnormal   Collection Time: 10/12/15  4:07 PM  Result Value Ref Range Status   Enterococcus species NOT DETECTED NOT DETECTED Final   Listeria monocytogenes NOT DETECTED NOT DETECTED Final   Staphylococcus species DETECTED (A) NOT DETECTED Final    Comment: CRITICAL RESULT CALLED TO, READ BACK BY AND VERIFIED WITH: MATT MCBANE AT 4259 10/14/15 DV    Staphylococcus aureus NOT DETECTED NOT DETECTED Final   Streptococcus species NOT DETECTED NOT DETECTED Final   Streptococcus agalactiae NOT DETECTED NOT DETECTED Final   Streptococcus pneumoniae NOT DETECTED NOT DETECTED Final   Streptococcus pyogenes NOT DETECTED NOT DETECTED Final   Acinetobacter baumannii NOT DETECTED NOT DETECTED Final  Enterobacteriaceae species NOT DETECTED NOT DETECTED Final   Enterobacter cloacae complex NOT  DETECTED NOT DETECTED Final   Escherichia coli NOT DETECTED NOT DETECTED Final   Klebsiella oxytoca NOT DETECTED NOT DETECTED Final   Klebsiella pneumoniae NOT DETECTED NOT DETECTED Final   Proteus species NOT DETECTED NOT DETECTED Final   Serratia marcescens NOT DETECTED NOT DETECTED Final   Haemophilus influenzae NOT DETECTED NOT DETECTED Final   Neisseria meningitidis NOT DETECTED NOT DETECTED Final   Pseudomonas aeruginosa NOT DETECTED NOT DETECTED Final   Candida albicans NOT DETECTED NOT DETECTED Final   Candida glabrata NOT DETECTED NOT DETECTED Final   Candida krusei NOT DETECTED NOT DETECTED Final   Candida parapsilosis NOT DETECTED NOT DETECTED Final   Candida tropicalis NOT DETECTED NOT DETECTED Final   Carbapenem resistance NOT DETECTED NOT DETECTED Final   Methicillin resistance DETECTED (A) NOT DETECTED Final    Comment: CRITICAL RESULT CALLED TO, READ BACK BY AND VERIFIED WITH: MATT MCBANE AT 1610 10/14/15 DV    Vancomycin resistance NOT DETECTED NOT DETECTED Final    Medications:  Scheduled:  . ALPRAZolam  0.5 mg Oral TID  . alteplase  2 mg Intracatheter Once  . alteplase  2 mg Intracatheter Once  . amiodarone  200 mg Per Tube BID  . antiseptic oral rinse  7 mL Mouth Rinse 10 times per day  . aspirin  325 mg Oral Daily  . budesonide  0.25 mg Nebulization 4 times per day  . chlorhexidine gluconate  15 mL Mouth Rinse BID  . feeding supplement (PRO-STAT SUGAR FREE 64)  30 mL Per Tube TID  . free water  30 mL Per Tube 6 times per day  . Influenza vac split quadrivalent PF  0.5 mL Intramuscular Tomorrow-1000  . ipratropium-albuterol  3 mL Nebulization Q6H  . lactulose  30 g Per Tube BID  . meropenem (MERREM) IV  2 g Intravenous 3 times per day  . nystatin   Topical BID  . pantoprazole sodium  40 mg Per Tube BID  . senna-docusate  2 tablet Oral BID  . vancomycin  1,500 mg Intravenous Q24H   Infusions:  . argatroban 0.0625 mcg/kg/min (10/17/15 0700)  . feeding  supplement (VITAL HIGH PROTEIN) 1,000 mL (09/20/2015 1558)  . norepinephrine (LEVOPHED) Adult infusion 60 mcg/min (09/25/2015 1553)  . pureflow 2,500 mL/hr at 10/16/2015 1152  . vasopressin (PITRESSIN) infusion - *FOR SHOCK* 0.03 Units/min (10/11/2015 9604)    Assessment: Pharmacy consulted for medication management for 40 yo male critical care patient requiring CRRT and mechanical ventilation.   Plan:  1. Meropenem: ID transitioned to meropenem on 1/26. Due to mass, will transition patient to meropenem 2g IV Q8hr.    2. Vancomycin: Patient started on vancomycin  IV Q24hr while on CRRT.  01/30 random level is 17 mcg/ml. Will continue with current vancomycin dosing.  3. CRRT Renal Adjustments: No further renal adjustments warranted at this time.    4. Constipation: Last BM 1/24. Continue senna/docusate 2 tab BID. Will increase lactulose to 30 g vt TID.   5. Electrolytes: Ordered magnesium 2g IV x 1 for goal magenesium >/= 2. Nephrology changed patient to 4k bath. Will recheck electrolytes with am labs.   Pharmacy will continue to monitor and adjust per consult.     Charlies Silvers, PharmD   10/12/2015

## 2015-10-20 NOTE — Progress Notes (Signed)
CDS called for referral.  Spoke to [facility]. Patient is not a candidate for tissue, organs, or eye donation.  Referral number [id number].  Patient has been released by CDS.

## 2015-10-20 NOTE — Progress Notes (Signed)
Pt went into sustained vtach/vfib. Brady down asystole. Heart and lung sounds assessed by two RN's. Pronounced dead at 1928 as evidenced by absence of pulse, heart and lung sounds.

## 2015-10-20 NOTE — Progress Notes (Signed)
MAP 50 while on 40 mcg of Levophed and 0.03 vasopressin. Increase Levophed as needed to maintain MAP of 65, no ceiling on Levophed per Dr. Belia Heman.

## 2015-10-20 NOTE — Care Management (Signed)
Intensivist met with family 1/28 to discuss poor prognosis.  Family is considering comfort measures. Palliative care consult pending.

## 2015-10-20 NOTE — Progress Notes (Signed)
PULMONARY/CCM PROGRESS NOTE  40 year old massively obese male with acute CHF, volume overload. Now with acute renal failure, vent dependent respiratory failure status post trach, metabolic encephalopathy, multiorgan failure   Day #31 in ICU  Lines, Tubes, etc: ETT 01/02 >> . Patient self extubated, subsequently reintubated 09/25/2015 L IJ CVL 01/02 >>   Microbiology: Blood 12/30 >>  MRSA PCR 01/02 >> NEG Urine 01/04 >> NEG Blood 01/04 >> no growth BAL 1/24>>normal respiratory flora Cefepime 1/24>> Vanc 1/24>>  PCT 01/04: 0.74  Antibiotics:  Levofloxacin 12/30 >> 01/01 Ceftriaxone 01/02 >> 01/02 Azithro 01/02 >> 01/02 Levofloxacin 01/04 >> 01/05 Vanc 01/05 >> 1/9 Ceftaz 01/05 >>1/9  Cefepime 1/25>>1/26 Vanc 1/26>> Meropenem 126>>   PCT 01/04: 0.74, 0.95, 0.77  Studies/Events: 12/31 TTE:  poor quality study, LVEF 45% 12/31 RUQ: Nodular contour the liver raising the question of cirrhosis. Gallbladder wall thickening, nonspecific in appearance 01/04 night - fever, hypotension. Cultures obtained. Abx expanded -failed self extubation 1/24 Bronchoscopy - thick purulent secretions, mainly in the upper airways, mucosal edema and erythema noted, BAL on RML.  1/16 Trach  Best Practice: DVT: enoxaparin SUP: enteral famotidine Nutrition: TFs    Subj: Patient continues to remains septic, cardiogenic shock on multiple vasopressors Remains encephalopathic. Remains on vent support and on CRRT, multiorgan failure No DNR, prognosis is very poor, fio2 increased to 100%  Obj: Filed Vitals:   09/26/2015 0600 10/11/2015 0645 09/28/2015 0700 09/23/2015 0734  BP:   Pulse: 114 113 113   Temp: 99.1 F (37.3 C)   98.1 F (36.7 C)  TempSrc: Oral     Resp: Height:      Weight:      SpO2: 95% 94% 94%     Gen: morbidly obese HEENT:  WNL.  Mild blood tinged secretions noted.  Neck: CVL site clean, JVP cannot be assessed Chest: shallow and  clear Cardiac: distant HS, regular, no M noted Abd: obese, soft, + BS Ext: symmetric edema, chronic stasis changes Neuro: GCS<10T  BMET    Component Value Date/Time   NA 139 10/14/2015 0441   K 3.5 10/09/2015 0441   CL 103 10/17/2015 0441   CO2 29 09/20/2015 0441   GLUCOSE 138* 10/09/2015 0441   BUN 56* 09/25/2015 0441   CREATININE UNABLE TO REPORT DUE TO ICTERUS 10/17/2015 0441   CALCIUM 8.6* 09/25/2015 0441   GFRNONAA NOT CALCULATED 09/29/2015 0441   GFRAA NOT CALCULATED 09/30/2015 0441    CBC    Component Value Date/Time   WBC 31.6* 10/05/2015 0441   RBC 3.85* 10/17/2015 0441   HGB 10.0* 10/17/2015 0441   HCT 32.9* 10/14/2015 0441   PLT 124* 10/05/2015 0441   MCV 85.5 10/06/2015 0441   MCH 25.9* 09/30/2015 0441   MCHC 30.3* 10/19/2015 0441   RDW 24.1* 09/22/2015 0441   LYMPHSABS 0.8* 10/07/2015 1116   MONOABS 1.4* 10/07/2015 1116   EOSABS 0.5 10/07/2015 1116   BASOSABS 0.1 10/07/2015 1116      IMPRESSION: 40 yo morbidly obese AAM with multiorgan failure(liver,kidney,cardiac,resp) with severe acidosis and encephalopathy from a consequences of cocaine abuse and sepsis  -- Acute on chronic hypercarbic respiratory failure , pulmonary edema.  S/P trach tube placement Oct 19, 2015 -- Obesity hypoventilation syndrome -- Atelectasis -- Cardiomyopathy - LVEF 45% 09/18/15, repeat ECHO 1/24 EF 50% mod RA Dil and elevated RVSP -- Shock - suspect septic,cardiogenic -- AKI, oliguric. CRRT initiated 1/18.  R IJ temporary HD catheter 10/06/15 -- Severe  hypervolemia  -- Sepsis -- ICU acquired anemia -- New thrombocytopenia - SQ heparin DC'd 01/22 -- ICU associated delirium --Tracheitis -- neurologic depression - since tracheostomy  PLAN/RECS:  Cont full vent support - settings reviewed and/or adjusted -switched to [facility] on 1/24 due to vent dyssynchrony.  Cont vent bundle Daily SBT if/when meets criteria Repeat ECHO performed 1/24, see above,elevated RVSP, dil RV and RA, ?  PE, hep stopped due to thrombocytopenia, started on argatroban 1/27 for DVT prophylaxis  Wean norepinephrine to off for MAP > 65 mmHg Monitor BMET intermittently Monitor I/Os Correct electrolytes as indicated CRRT per Renal, stopped on 1/24, did not tolerated HD well, restarted CRRT with new cartridges on 1/25. Monitor Plts closely, now improving.  DVT px: SCDs Monitor CBC intermittently  Keep eye on plt counts Transfuse per usual ICU guidelines PRN fentanyl and Versed, low dose Xanax Cont SSRI PICC line placed, previous CVL removed.  Palliative care consult, patient is dying , recommend comfort care measures   I have personally obtained a history, examined the patient, evaluated Pertinent laboratory and [employer]/imaging results, and  formulated the assessment and plan   The Patient requires high complexity decision making for assessment and support, frequent evaluation and titration of therapies, application of advanced monitoring technologies and extensive interpretation of multiple databases. Critical Care Time devoted to patient care services described in this note is 35 minutes.   Overall, patient is critically ill, prognosis is guarded.  Patient with Multiorgan failure and at high risk for cardiac arrest and death.    Lucie Leather, M.D.  [facility]  Medical Director [facility] Medical Director [facility] Cardio-Pulmonary Department

## 2015-10-20 NOTE — Progress Notes (Signed)
ANTICOAGULATION CONSULT NOTE - Follow Up Consult  Pharmacy Consult for Argatroban Indication: possible PE  No Known Allergies  Patient Measurements: Height:  (175.3 cm) Weight: (!) 615 lb 15.4 oz (279.4 kg) IBW/kg (Calculated) : 70.7    Vital Signs: Temp: 99.1 F (37.3 C) (01/30 0600) Temp Source: Oral (01/30 0600) BP: 90/53 mmHg (01/30 0600) Pulse Rate: 114 (01/30 0600)  Labs:  Recent Labs  10/15/15 1306  10/15/15 1749  10/16/15 0140 10/16/15 0620  10/16/15 2220 10/17/15 0415  10/17/15 1708 10/17/15 2157 09/30/2015 0441 10/04/2015 0506  HGB  --   --   --   < > 10.0*  --   --   --  9.7*  --   --   --  10.0*  --   HCT  --   --   --   --  32.7*  --   --   --  32.0*  --   --   --  32.9*  --   PLT  --   --   --   --  82*  --   --   --  97*  --   --   --  PENDING  --   APTT 44*  --   --   < > 156*  --   < > 128* 134*  --   --   --   --  116*  LABPROT 22.4*  --   --   --   --   --   --   --   --   --   --   --   --  43.2*  INR 1.98  --   --   --   --   --   --   --   --   --   --   --   --  4.74*  CREATININE  --   < >  --   --  1.56*  --   < > 1.31* 1.21  < > UNABLE TO REPORT DUE TO ICTERUS UNABLE TO REPORT DUE TO ICTERUS UNABLE TO REPORT DUE TO ICTERUS  --   TROPONINI  --   --  0.06*  --  0.07* 0.05*  --   --   --   --   --   --   --   --   < > = values in this interval not displayed.  CrCl cannot be calculated (Patient has no serum creatinine result on file.).   Medical History: Past Medical History  Diagnosis Date  . Super obesity (HCC)   . Tobacco abuse   . Chronic systolic CHF (congestive heart failure) (HCC)   . Atrial fibrillation (HCC) 09/2015    Medications:  Scheduled:  . ALPRAZolam  0.5 mg Oral TID  . alteplase  2 mg Intracatheter Once  . alteplase  2 mg Intracatheter Once  . amiodarone  200 mg Per Tube BID  . antiseptic oral rinse  7 mL Mouth Rinse 10 times per day  . aspirin  325 mg Oral Daily  . budesonide  0.25 mg Nebulization 4 times per day   . chlorhexidine gluconate  15 mL Mouth Rinse BID  . feeding supplement (PRO-STAT SUGAR FREE 64)  30 mL Per Tube TID  . free water  30 mL Per Tube 6 times per day  . Influenza vac split quadrivalent PF  0.5 mL Intramuscular Tomorrow-1000  . ipratropium-albuterol  3 mL Nebulization Q6H  .  lactulose  30 g Per Tube BID  . meropenem (MERREM) IV  1 g Intravenous Q12H  . nystatin   Topical BID  . pantoprazole sodium  40 mg Per Tube BID  . senna-docusate  1 tablet Oral BID  . vancomycin  1,500 mg Intravenous Q24H   Infusions:  . argatroban 0.0625 mcg/kg/min (10/17/15 0700)  . feeding supplement (VITAL HIGH PROTEIN) 1,000 mL (10/17/15 2131)  . norepinephrine (LEVOPHED) Adult infusion 37 mcg/min (11-01-15 0546)  . pureflow 3 each (Nov 01, 2015 0536)    Assessment: 40 y/o morbidly obese male with respiratory failure. Patient has thrombocytopenia and possible HIT although SRA results of 1 make HIT less likely. There is some concern for clot, possible PE and patient is not a candidate for mechanical DVT prophylaxis due to size.   Child Pugh Score > or = 7  Goal of Therapy:  aPTT 45-95 seconds Monitor platelets by anticoagulation protocol: Yes   Plan:  Due to Child Pugh score > 6, started argatroban at reduced rate of 0.5 mcg/kg/min. Check APTT 3 hours after starting infusion.  1/27:  APTT @ 19:00 = 124 Will decrease gtt rate by 0.30mcg/kg/min .  Will recheck aPTT 3 hrs after rate change on 1/28 @ 00:00.   1/28 01:30 aPTT 156. Current argatroban rate 0.25 mcg/kg/min. Recommended rate reduction per protocol is also 0.25 mcg/kg/min so will reduce dose by 50% and recheck in 3 hours.  1/28 0730 aPTT 137. Rate already reduced to 0.125 mcg/kg/min by RN. Will recheck aPTT in 3 hours.  01/28 1050 aPTT 144  aPTT continues to increase despite reduced rate. Will hold argatroban infusion for 30 minutes and resume infusion based on ideal body weight instead of adjusted body weight at 0.125 mcg/kg/min. Will  recheck aPTT in 3 hours.    01/28 1525 aPTT 114 aPTT is coming down but is still above goal. Rate is at 0.125 mcg/kg/min based on IBW so may not be able to go much lower. Will continue current rate for now and check another aPTT in 3 hours.   01/28  1748 aPTT 143  Will hold argatroban infusion for 30 min and then decrease rate to 0.0641mcg/kg/min. Pump unable to run at 0.265 so it will have to run at 0.63ml/hr. Not sure we can run the rate any slower. Will recheck in 3 hours from the drip being turned back on. Discussed this with the RN  1/28 PM aPTT 128. Pump is at lowest volume measurable. Discussed with MD. Will continue current rate and continue to monitor aPTT and watch patient for bleeding. Plan discussed with RN also. APTT ordered with AM labs.  1/29 AM aPTT 134. Will continue with current rate per earlier conversation with hospitalist. Recheck aPTT and CBC in AM.  1/30 AM aPTT 116. Continue argatroban at current rate and recheck aPTT tomorrow AM.   Fulton Reek, PharmD, BCPS  2015/11/01

## 2015-10-20 NOTE — Progress Notes (Signed)
Update: Patient worsening hypotension and increasing pressor requirement. Having more episodes of vtach. Mother updated current clinical status and poor prognosis.  Stephanie Acre, MD [facility] Pager [phone] (please enter 7-digits) On Call Pager - [phone] (please enter 7-digits)

## 2015-10-20 DEATH — deceased

## 2015-11-17 NOTE — Discharge Summary (Signed)
This serves as a death summary for patient Mitchell Morrow  Date of Admission:  04-Oct-2015  Date of Death:  November 04, 2015  Problem List Items Addressed This Visit      Cardiovascular and Mediastinum   CHF (congestive heart failure) (HCC)   Relevant Medications   furosemide (LASIX) injection 60 mg (Completed)   diltiazem (CARDIZEM) injection 15 mg (Completed)   furosemide (LASIX) injection 40 mg (Completed)   furosemide (LASIX) injection 80 mg (Completed)   diltiazem (CARDIZEM) injection 10 mg (Completed)   diltiazem (CARDIZEM) injection 10 mg (Completed)   amiodarone (NEXTERONE) IV bolus only 150 mg/100 mL (Completed)   amiodarone (NEXTERONE) IV bolus only 150 mg/100 mL (Completed)   Other Relevant Orders   DG Chest Port 1 View (Completed)     Respiratory   Pneumonia   Relevant Medications   levofloxacin (LEVAQUIN) IVPB 750 mg (Completed)   vancomycin (VANCOCIN) 1,250 mg in sodium chloride 0.9 % 250 mL IVPB (Completed)   vancomycin (VANCOCIN) 1,500 mg in sodium chloride 0.9 % 500 mL IVPB (Completed)   vancomycin (VANCOCIN) 1,500 mg in sodium chloride 0.9 % 500 mL IVPB (Completed)   Other Relevant Orders   DG Chest Port 1 View (Completed)   Bilateral pneumonia - Primary   Relevant Medications   levofloxacin (LEVAQUIN) IVPB 750 mg (Completed)   vancomycin (VANCOCIN) 1,250 mg in sodium chloride 0.9 % 250 mL IVPB (Completed)   vancomycin (VANCOCIN) 1,500 mg in sodium chloride 0.9 % 500 mL IVPB (Completed)   vancomycin (VANCOCIN) 1,500 mg in sodium chloride 0.9 % 500 mL IVPB (Completed)   Respiratory failure (HCC)   Relevant Orders   DG Chest Port 1 View (Completed)   DG Chest Port 1 View (Completed)   DG Chest Port 1 View (Completed)   DG CHEST PORT 1 VIEW (Completed)     Other   Encounter for nasogastric tube placement   Relevant Orders   DG Abd 1 View (Completed)    Other Visit Diagnoses    Hyperbilirubinemia        Relevant Orders    US Abdomen Limited RUQ (Completed)    Encounter for central line placement        Relevant Orders    DG Chest Port 1 View (Completed)    Encounter for orogastric (OG) tube placement        Encounter for intubation        Dyspnea        Relevant Orders    DG Chest 1 View (Completed)    DG Chest 1 View (Completed)    DG Chest 1 View (Completed)    DG Chest 1 View (Completed)    Difficult intubation        Relevant Orders    DG Chest Port 1 View (Completed)    Encounter for imaging study to confirm orogastric (OG) tube placement        Relevant Orders    DG Abd 1 View (Completed)    Fracture        Relevant Orders    DG Ankle 2 Views Right (Completed)    DG Foot 2 Views Right (Completed)    Acute respiratory failure with hypoxemia (HCC)        Relevant Orders    DG Chest Port 1 View (Completed)    Abdominal distention        Status post PICC central line placement        Relevant Orders    DG Chest  Port 1 View (Completed)    S/P PICC central line placement        Relevant Orders    DG Chest Port 1 View (Completed)      Hospital Course:  40 year old male with a history of morbid obesity and tobacco abuse who presented with lower extremity edema and found to have bilateral pneumonia.  1. Acute hypoxic hypercapnic respiratory failure- - this was multifactorial due to obesity hypoventilation syndrome, community-acquired pneumonia and pulmonary edema, ARDS.  - Patient remains Intubated and then underwent Tracheostomy done on 09/28/2015.  He was maintained on the ventilator and was difficult to wean due to multiple comorbidities  Pulmonary was following the patient. He did have pneumonia and therefore was treated with broad-spectrum IV antibiotics with vancomycin and ceftaz.    2. Septic Shock - this was due to the pneumonia as mentioned. Patient required high-dose vasopressors with Levophed and vasopressin to keep his mean arterial pressure greater than 55-60.   3. New onset systolic congestive heart failure, EF  45% :  -He was and uric/oliguric and therefore had to be started on dialysis to remove fluid. Due to his hypotension he could not tolerate hemodialysis therefore was maintained on continuous dialysis. -Despite that he continued to have high O2 requirements.  4. Acute renal failure - Due to ATN and septic shock. Due to persistent oliguria and hemodynamic instability patient he was started on CRRT on 10/06/15 was maintained on it during the hospital course.  5. Nutrition- he had a NG tube with tube feeds. He needed a PEG tube but due to his large body habitus,GI could not do it endoscopically and as per IR team, patient was too heavy for a CT table and also the fluoroscopic table, therefore, IR could not do it either. Surgery services also say they cannot place the PEG tube due to his morbid obesity and high surgical risk. Patient would have needed an open procedure for his PEG tube placement by surgical team, however will be a high risk procedure considering his body habitus and also his other multiple medical problems. - We tried contacting tertiary care centers at [facility], [place] but they have refused to do it due to his comorbidities and poor functional status  6. Hyperbilirubinemia:due to chronic passive congestion from congestive heart failure.  - his Bili's and LFT's continued to get worse despite optimal medical therapy.  - hepatitis panel (-) and we could not do any diagnostic tests due to his morbid obesity.   7. Atrial fibrillation - known history of paroxysmal atrial fibrillation. He was maintained on Amio.    8. 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  - He was taken off heparin and started on Bactroban.  Patient given his multiple comorbidities had a very poor prognosis. Despite aggressive care for multiple days his condition continued to worsen. Patient's family was then approached to discuss goals of care. The intensivist discussed that the patient's recovery is unlikely and  therefore the family made him DO NOT RESUSCITATE and then moved towards making him comfort care. Patient was taken off life support on the late evening of 10/03/2015 and he shortly thereafter passed away. The family was made aware.

## 2016-10-05 IMAGING — CR DG CHEST 1V PORT
1 series · 2 of 2 positions shown · non-contrast
Comparison: 09/27/2015

CLINICAL DATA: Hypoxia this morning. Check endotracheal tube
position.

EXAM:
PORTABLE CHEST 1 VIEW

[Series 1: ap · 0.17mm/px · 2 of 2 slices shown]
[im 1/2]
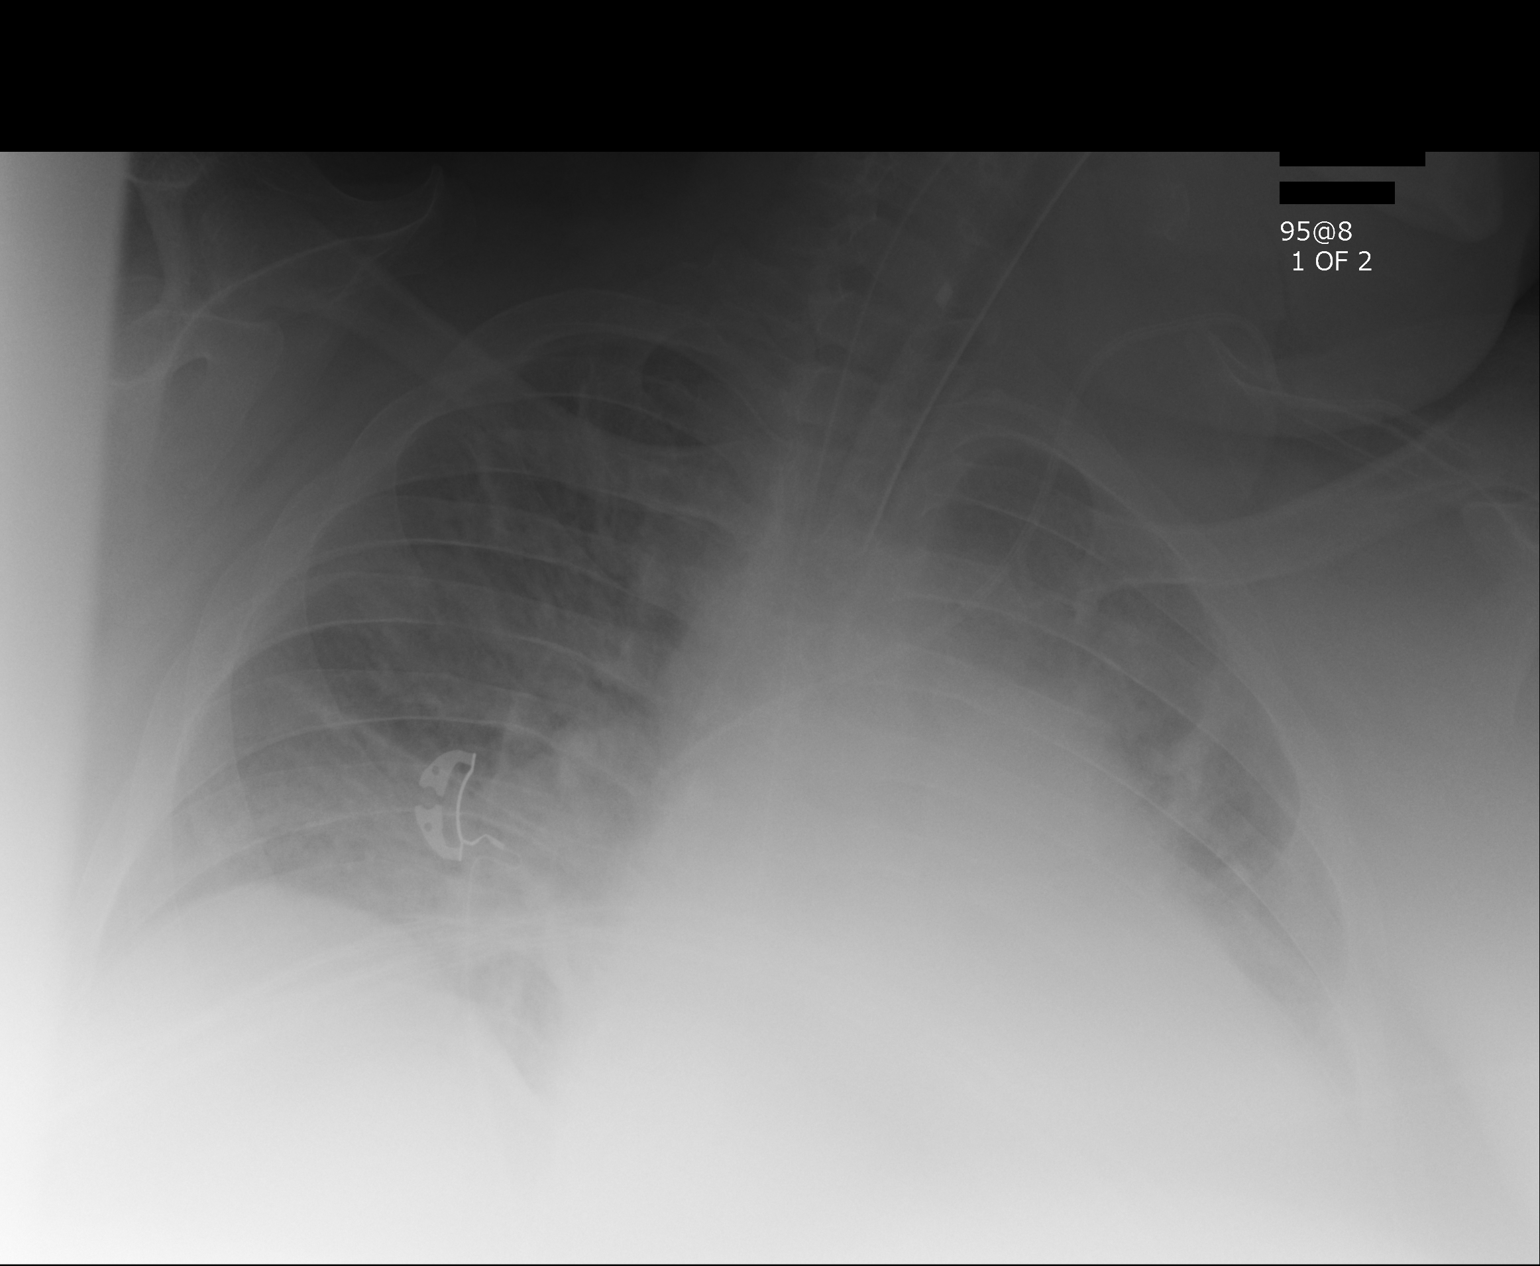
[im 2/2]
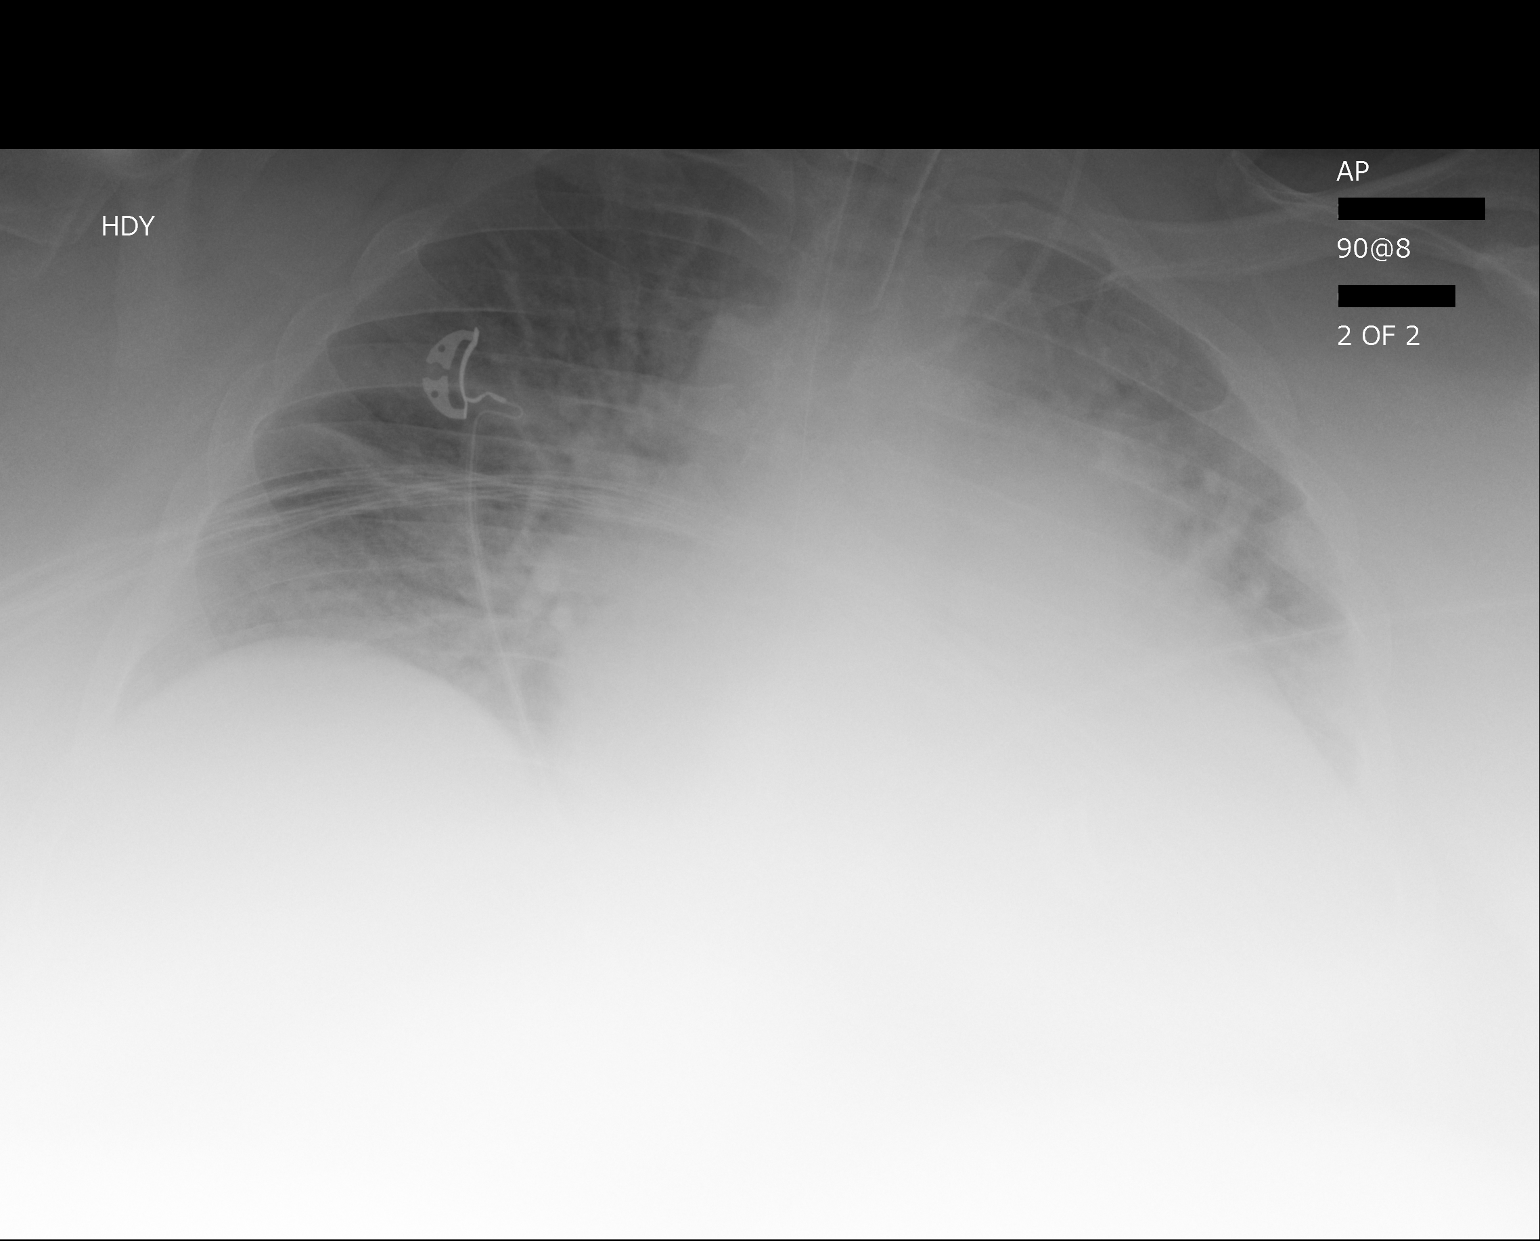

[2 of 2 positions shown; findings below may reference images not displayed]

FINDINGS: Patient is moderately rotated to the left. Endotracheal tube
approximately 3.2 cm above the carina. Left IJ central venous
catheter unchanged. Nasogastric tube courses into the region of the
stomach and off the inferior portion of the film as tip is not
visualized.

Lungs are hypoinflated with persistent prominence of the perihilar
markings suggesting mild interstitial edema unchanged. Persistent
left base opacification likely effusion with atelectasis although
cannot exclude infection. Stable cardiomegaly. Remainder the exam is
unchanged.
IMPRESSION: Hypoinflation with stable left base opacification likely
effusions/atelectasis although cannot exclude infection.

Stable cardiomegaly and mild interstitial edema unchanged.

Tubes and lines unchanged.

## 2016-10-06 IMAGING — CR DG CHEST 1V PORT
1 series · 1 of 1 positions shown · non-contrast
Comparison: Portable exam 0886 hours compared to 10/02/2015

CLINICAL DATA: Respiratory failure, smoker, chronic systolic CHF,
atrial fibrillation

EXAM:
PORTABLE CHEST 1 VIEW

[portable]
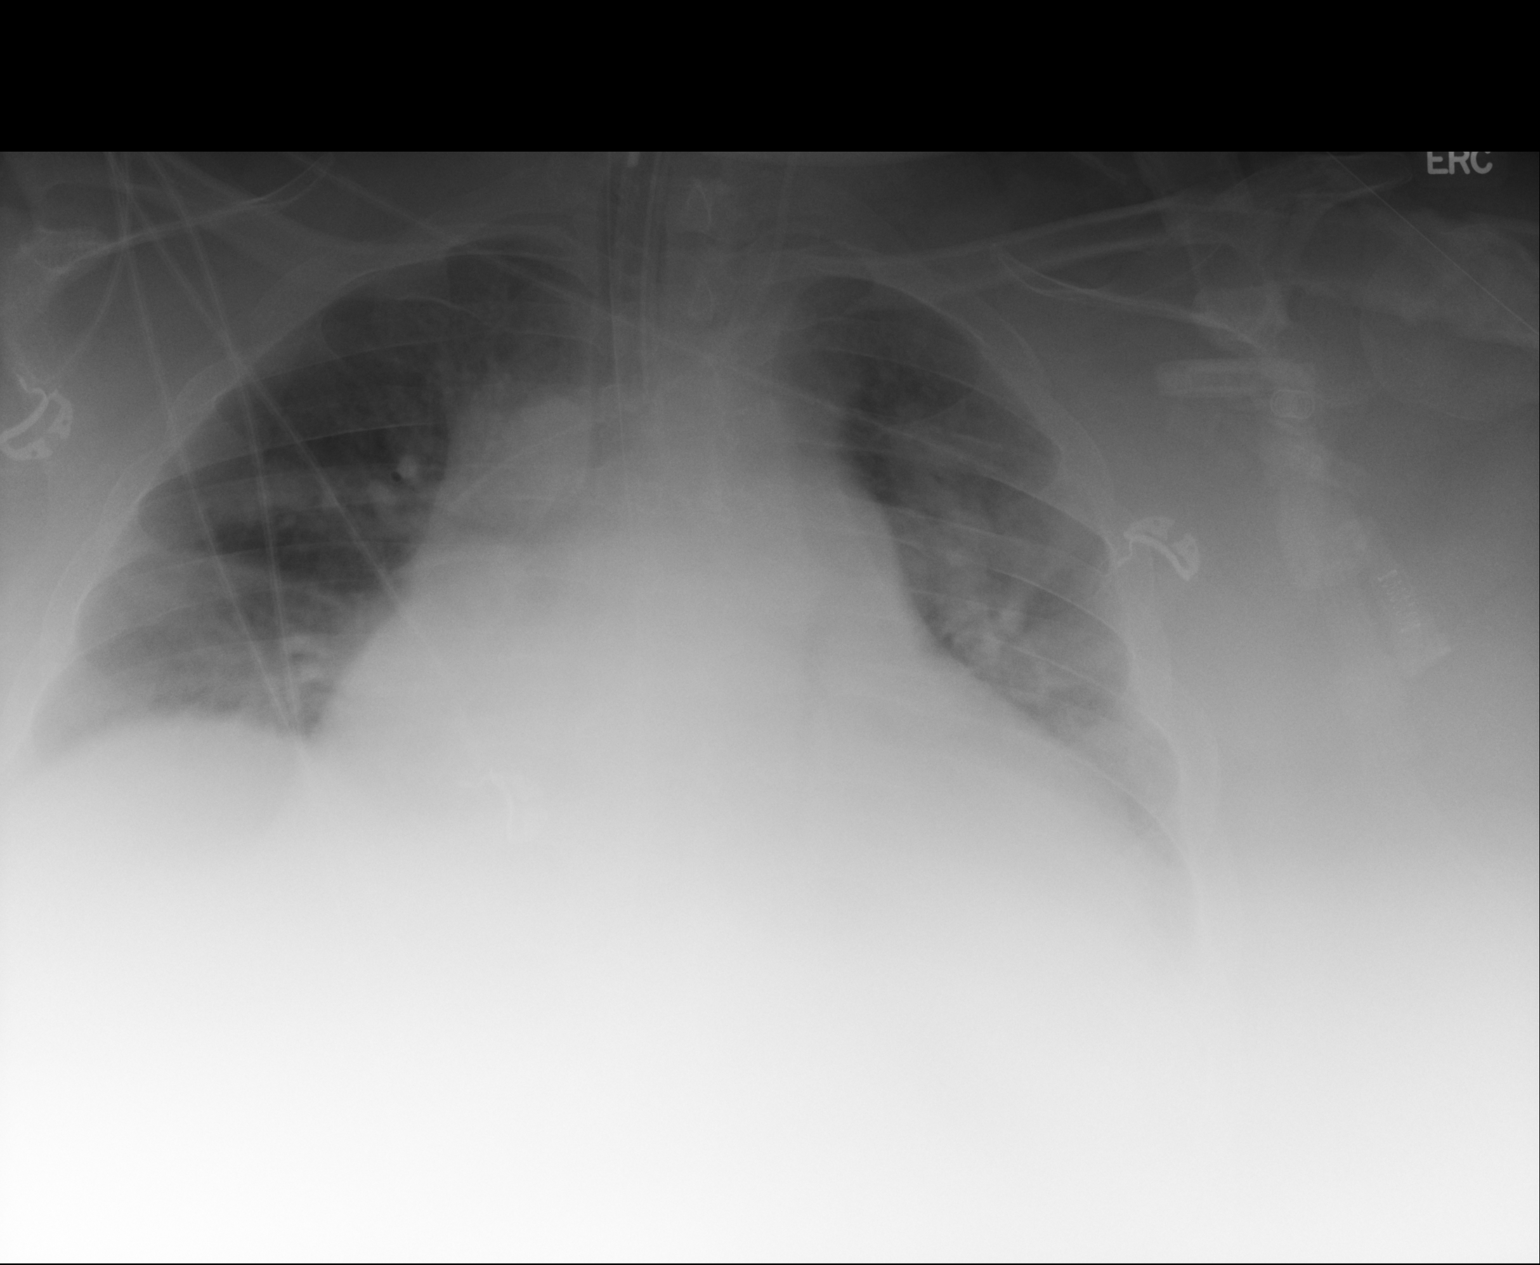

[1 of 1 positions shown; findings below may reference images not displayed]

FINDINGS: Tip of endotracheal tube projects 3.5 cm above carina.

Nasogastric tube extends to at least the mid chest, distal and stent
obscured by light technique.

LEFT jugular central venous catheter tip projects over SVC.

Enlargement of cardiac silhouette with pulmonary vascular
congestion.

Motion artifacts degrade assessment of lung bases.

Questionable perihilar infiltrate versus edema.

Bibasilar atelectasis.

No gross pneumothorax.
IMPRESSION: Enlargement of cardiac silhouette with pulmonary vascular
congestion.

Bibasilar atelectasis with question mild pulmonary edema.

## 2016-10-07 IMAGING — CR DG CHEST 1V PORT
1 series · 1 of 1 positions shown · non-contrast
Comparison: 10/03/2015

CLINICAL DATA: Pt had trach placed this am; pt has been in hospital
since [REDACTED] for multiple problems; morbidly obese, smoker, afib

EXAM:
PORTABLE CHEST 1 VIEW

[ap]
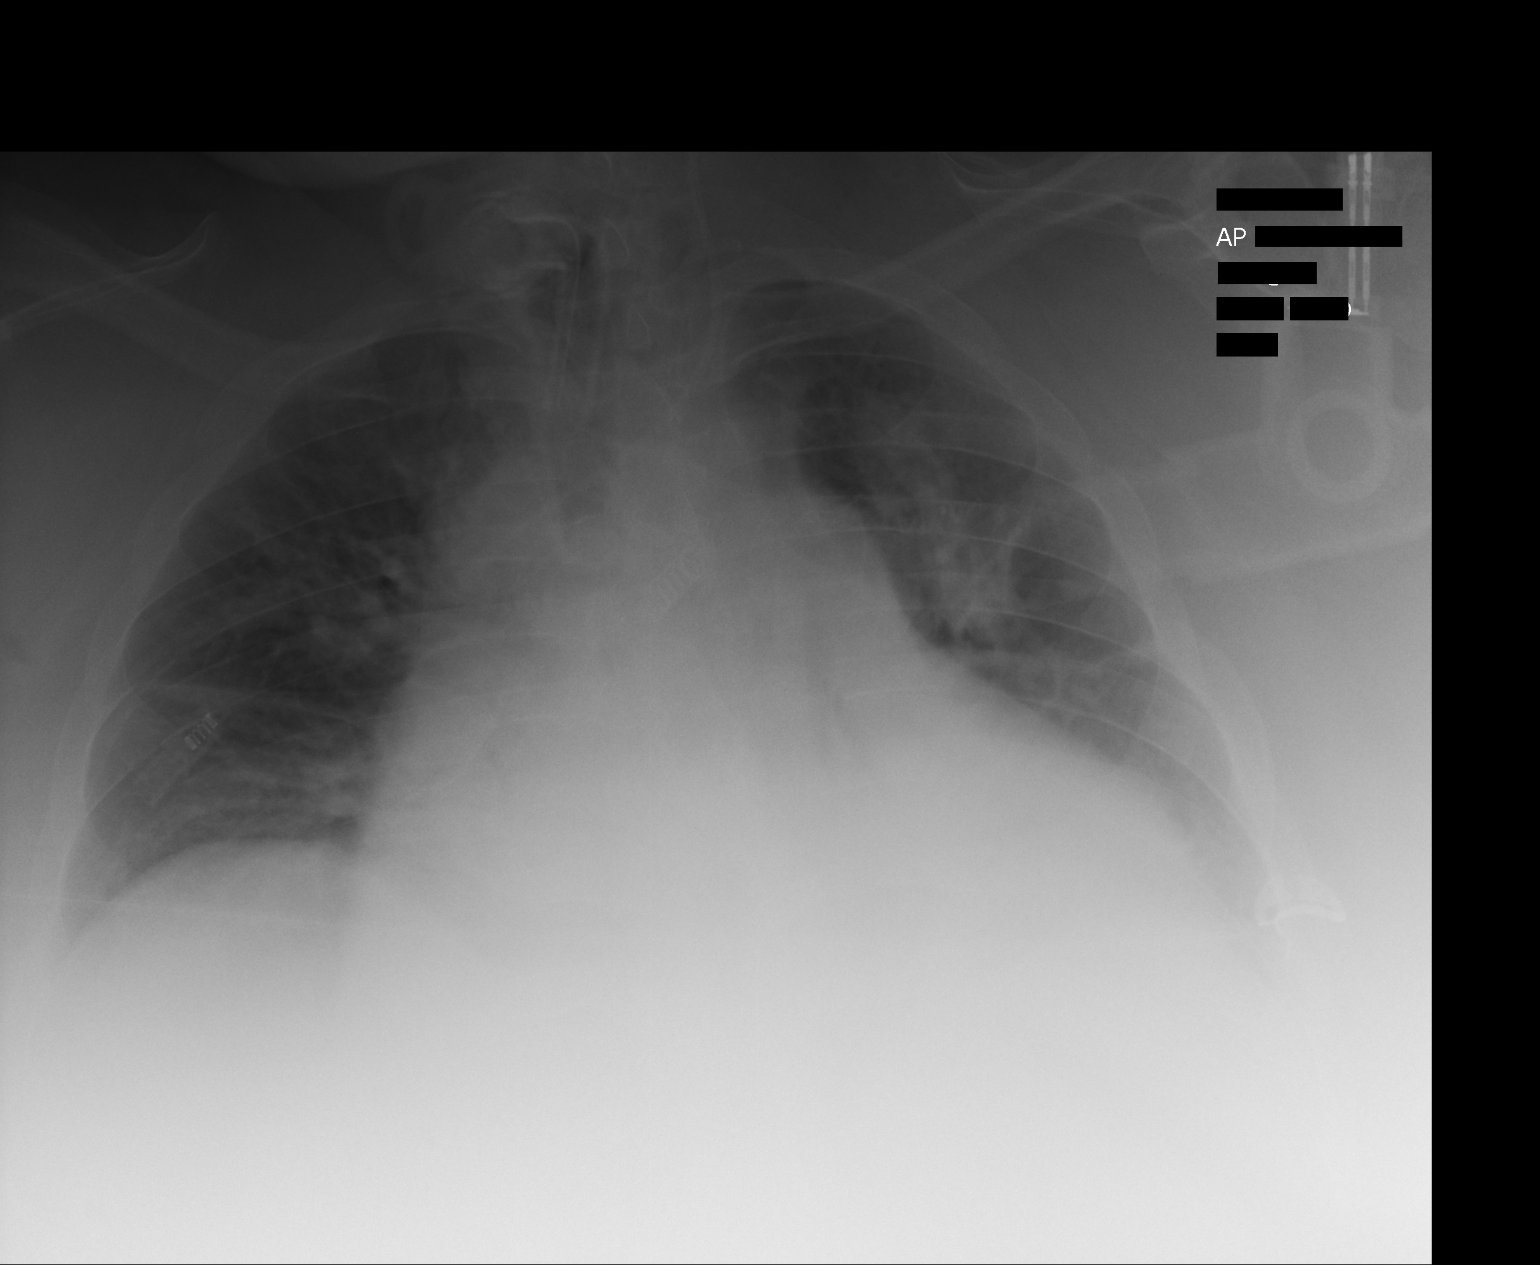

[1 of 1 positions shown; findings below may reference images not displayed]

FINDINGS: Tracheostomy tube projects over the tracheal air column with tip
cm above the carina.

Left internal jugular central line tip projects over the superior
vena cava.

Study limited by body habitus. Severe cardiac enlargement. Vascular
congestion, moderate. Right lung otherwise clear. Patchy left upper
lobe opacity consistent with infiltrate.
IMPRESSION: Lines and tubes as described with severe cardiac enlargement.
Vascular congestion persists. Focal left upper lobe airspace disease
likely represents residual clearing pulmonary edema. Pneumonia not
excluded and follow-up recommended.

## 2016-10-08 IMAGING — CR DG ABDOMEN 1V
1 series · 6 of 6 positions shown · non-contrast
Comparison: 09/25/2015

CLINICAL DATA: NG tube placement.

EXAM:
ABDOMEN - 1 VIEW

[Series 1: ap · 0.17mm/px · 6 of 6 slices shown]
[im 1/6]
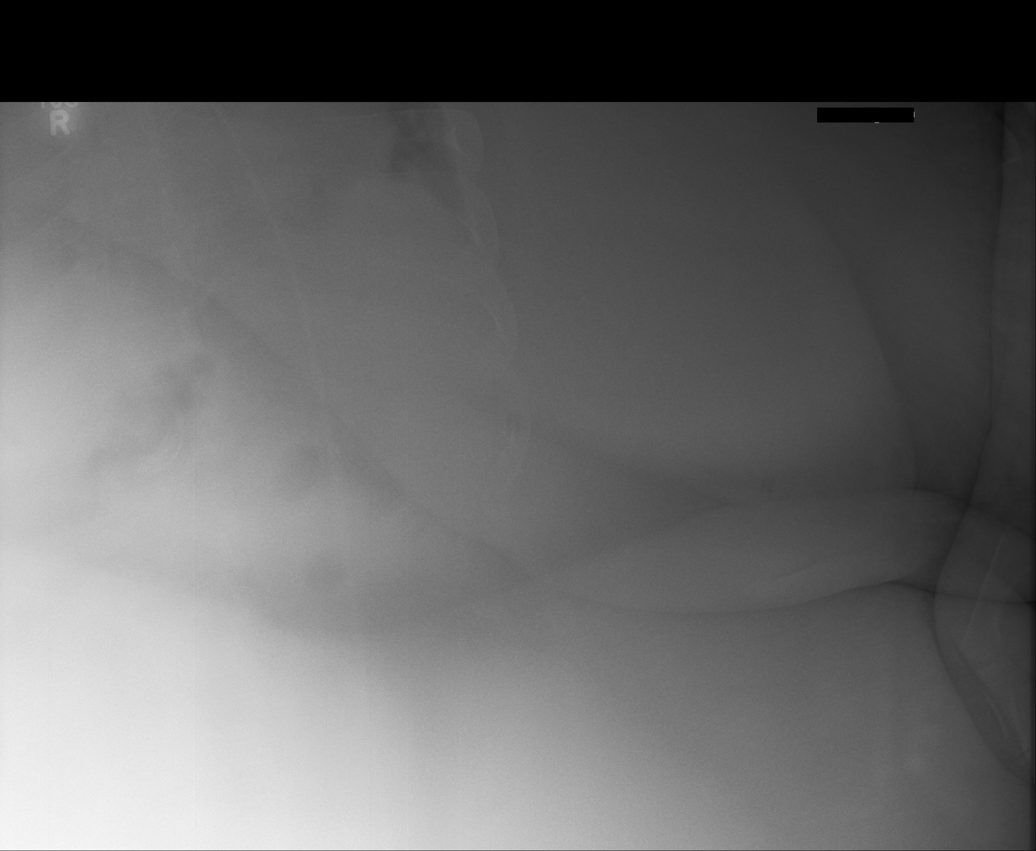
[im 2/6]
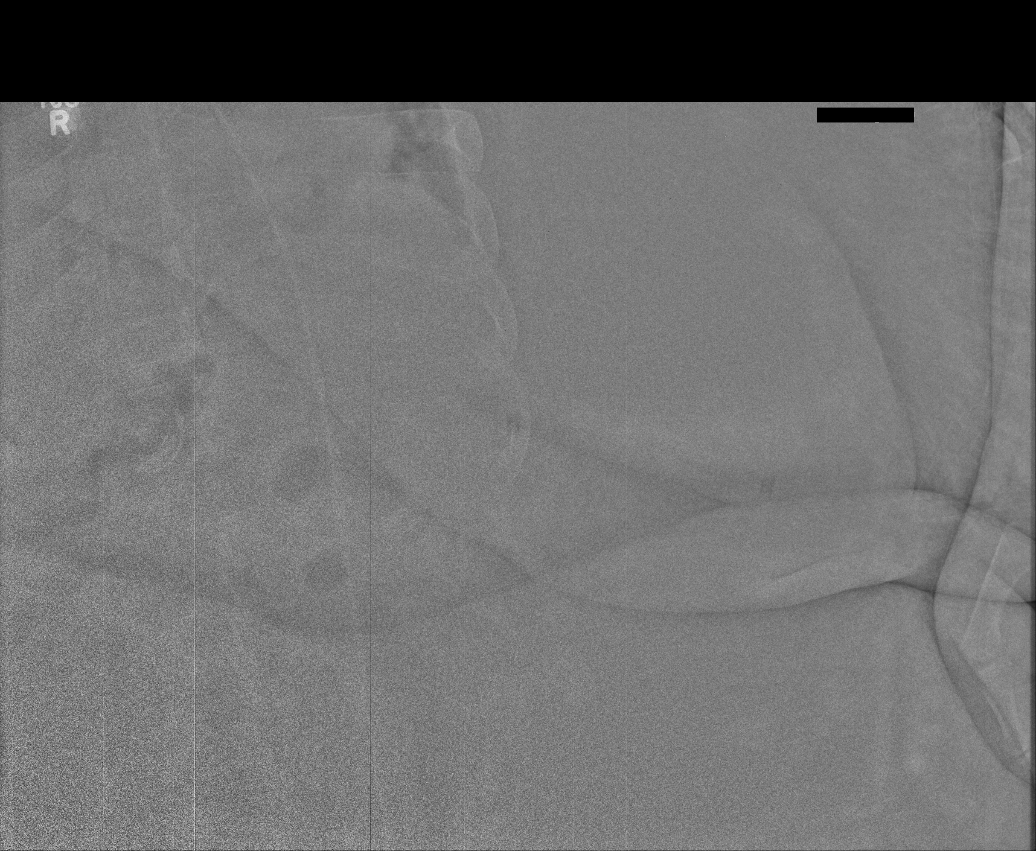
[im 3/6]
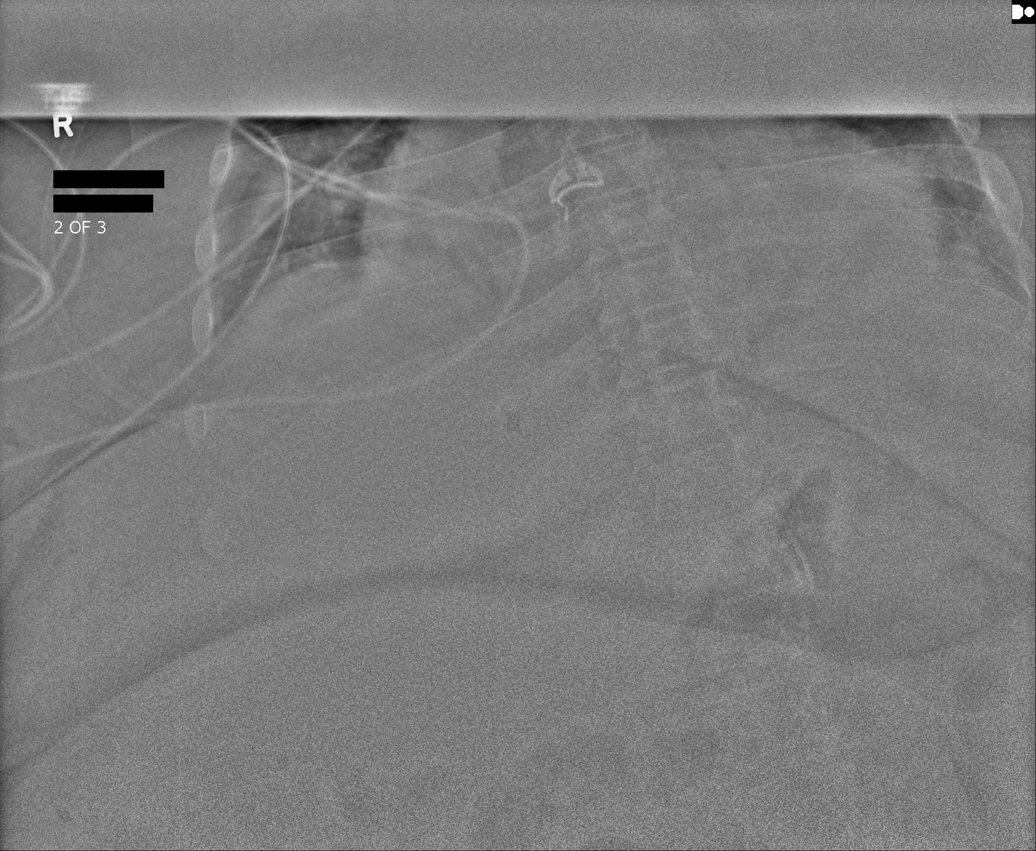
[im 4/6]
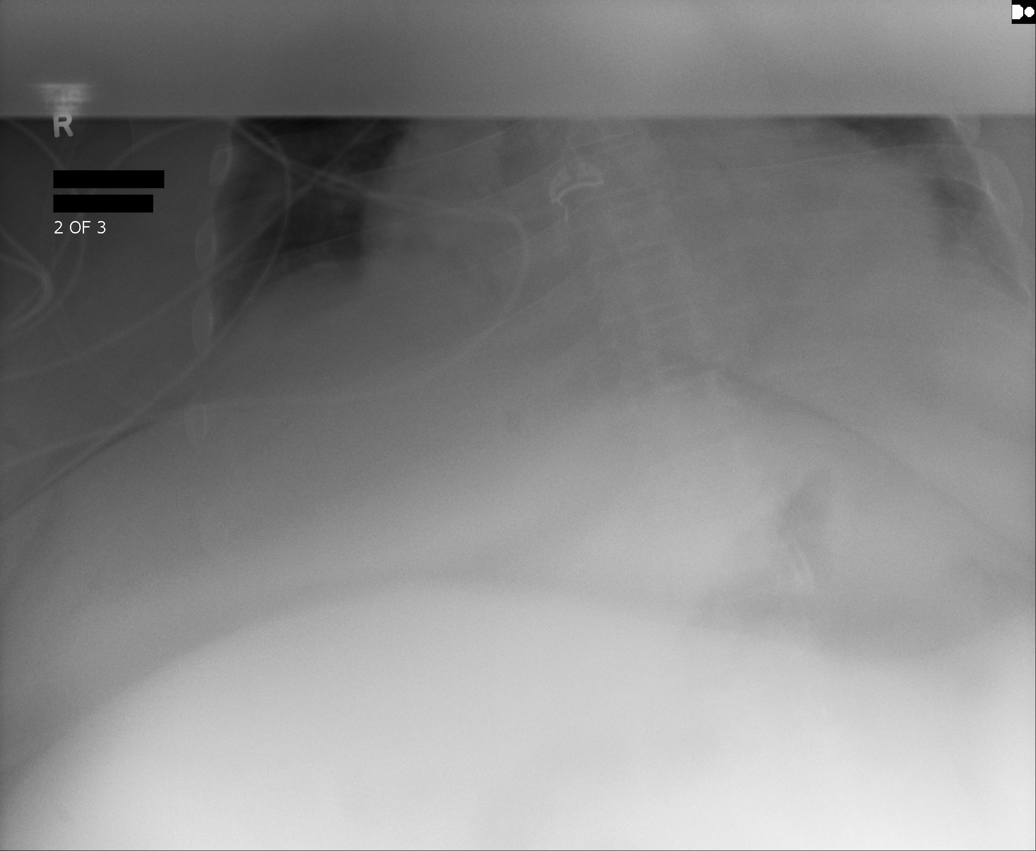
[im 5/6]
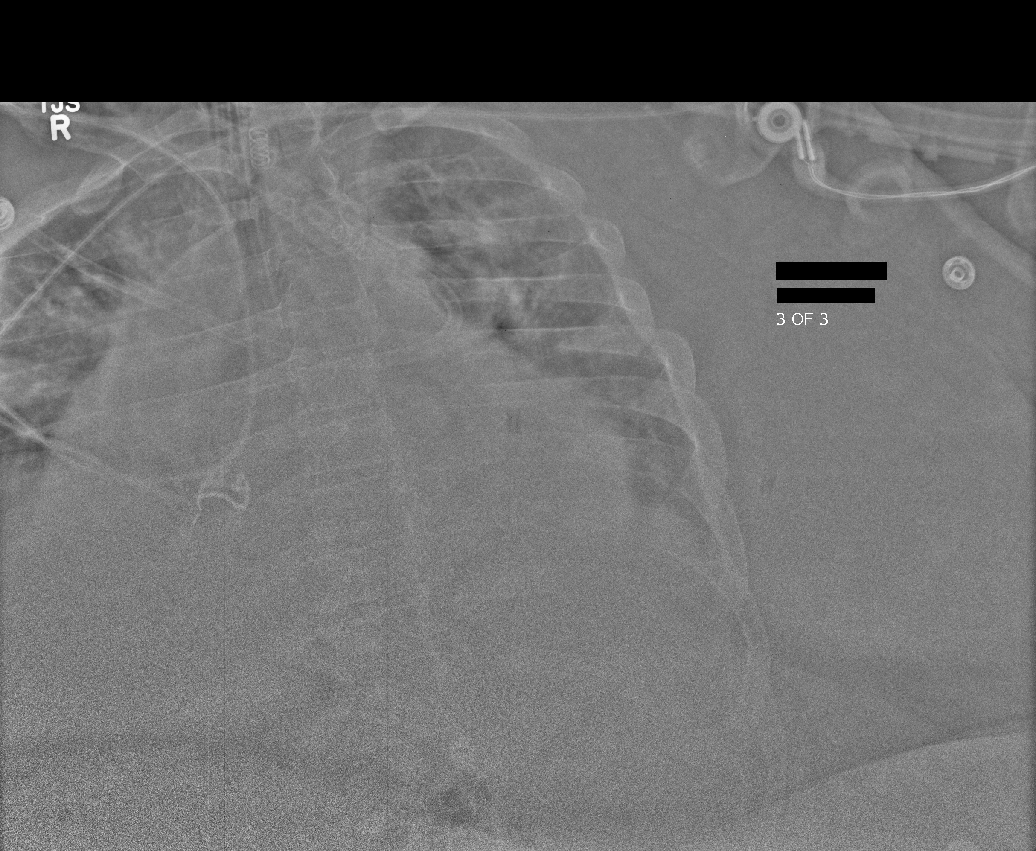
[im 6/6]
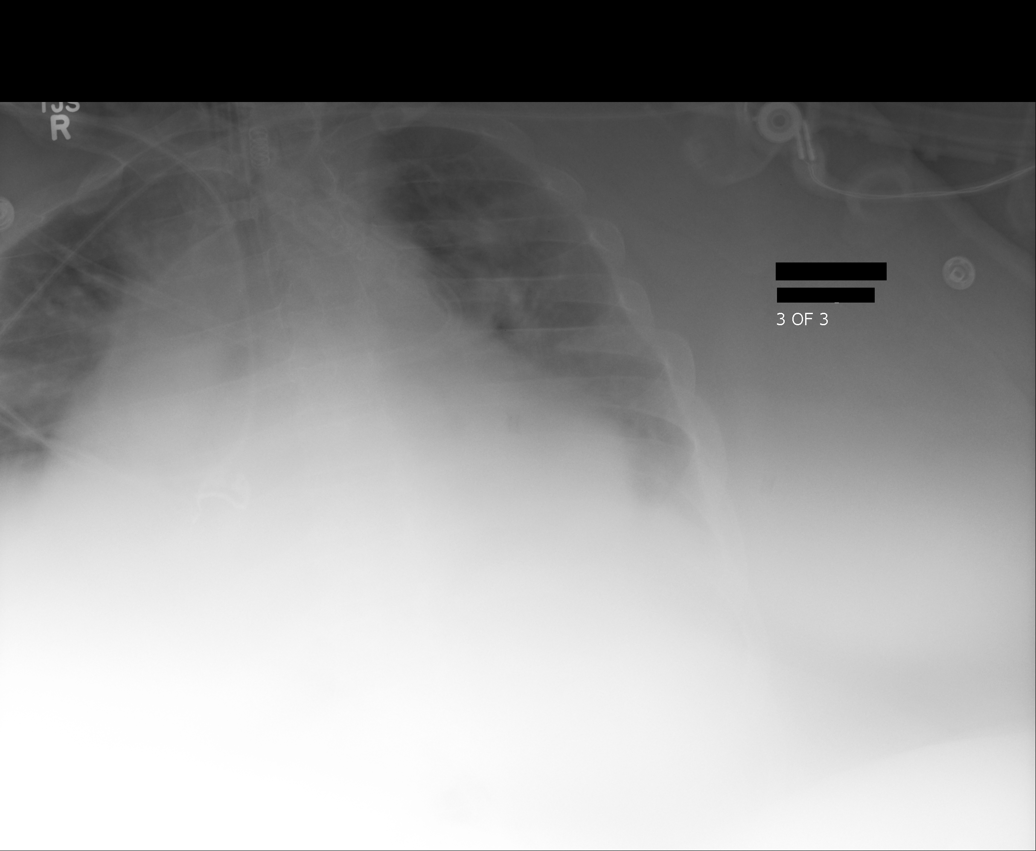

[6 of 6 positions shown; findings below may reference images not displayed]

FINDINGS: Severely limited study by body habitus. Very difficult to visualize
the NG tube. There appears to be a tube coiling within the stomach.
IMPRESSION: Very limited study. There appears to be in NG tube coiling in the
stomach.

## 2016-10-08 IMAGING — CR DG CHEST 1V
1 series · 2 of 2 positions shown · non-contrast
Comparison: October 04, 2015.

CLINICAL DATA: Dyspnea.

EXAM:
CHEST 1 VIEW

[Series 1: portable · 0.17mm/px · 2 of 2 slices shown]
[im 1/2]
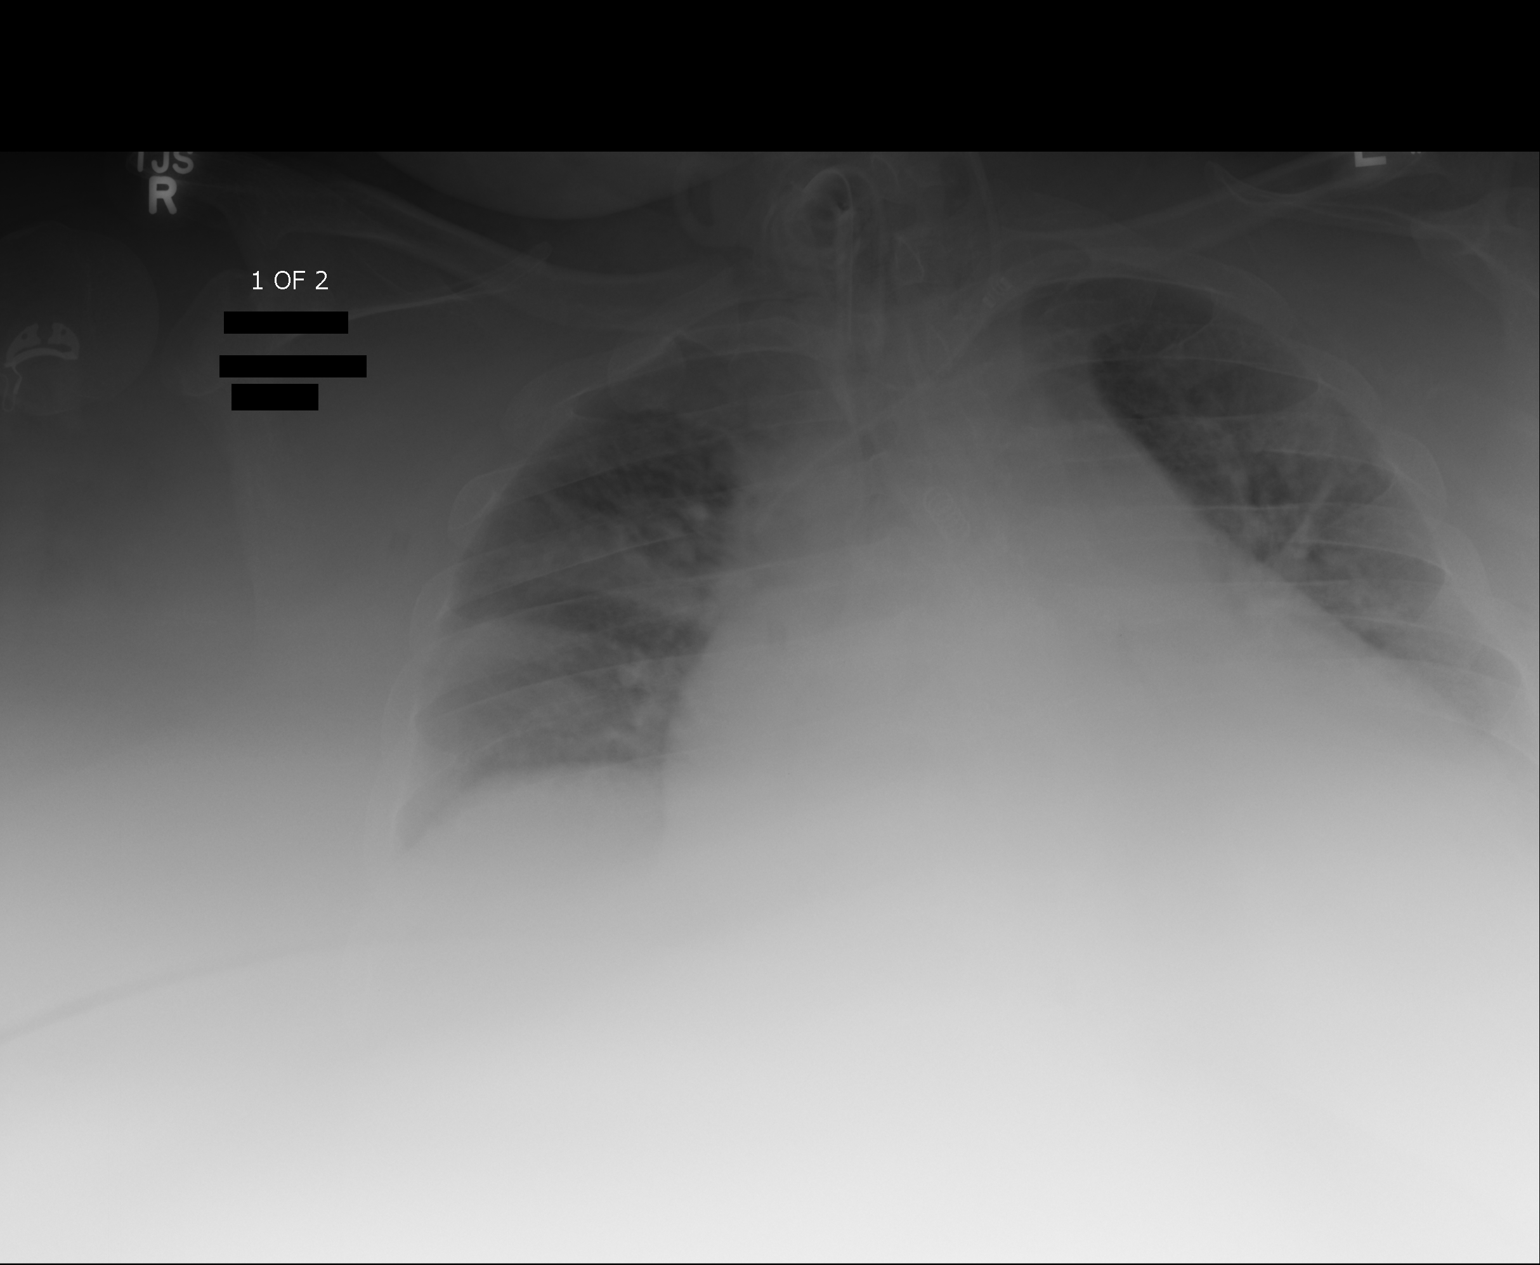
[im 2/2]
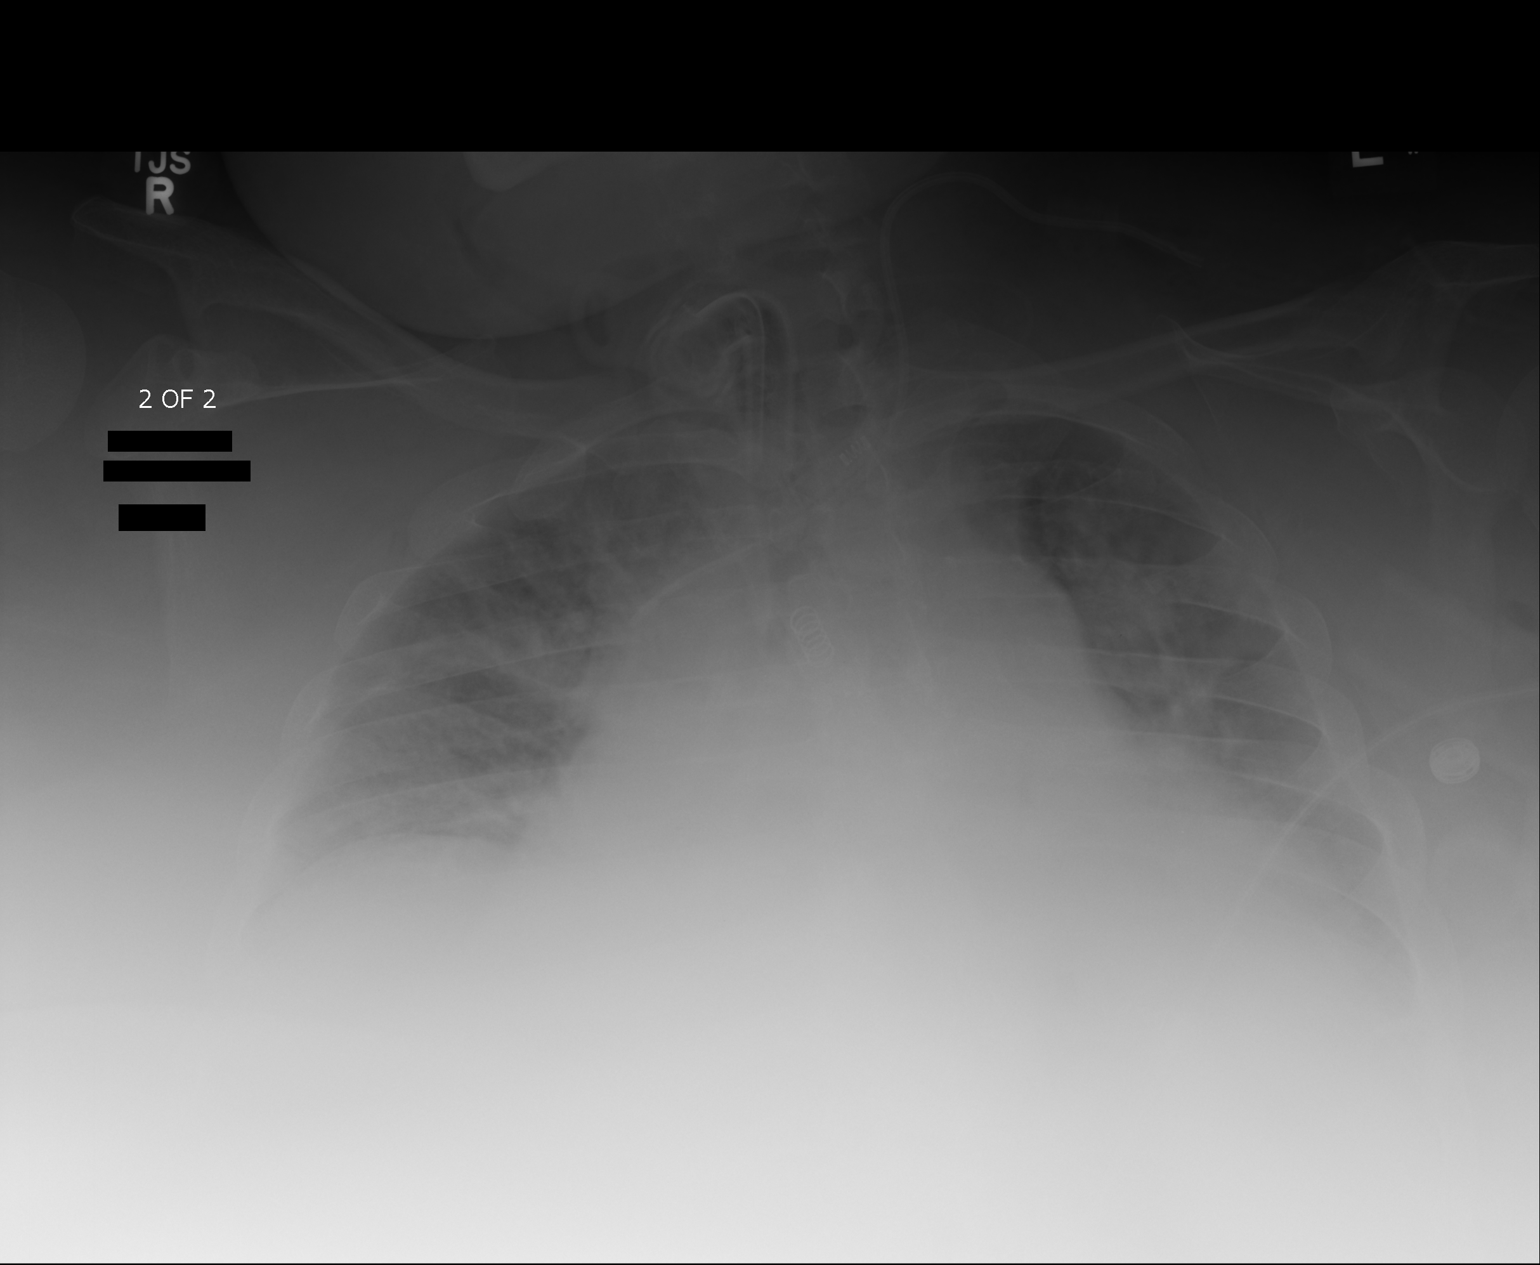

[2 of 2 positions shown; findings below may reference images not displayed]

FINDINGS: Tracheostomy is in grossly good position. Hypoinflation of the lungs
is noted. Left internal jugular catheter is noted with distal tip in
expected position of SVC. Stable cardiomegaly. No pneumothorax or
significant pleural effusion is noted. Slightly increased right
upper lobe opacity is noted concerning for atelectasis or possibly
pneumonia. Stable left upper lobe opacity is noted. Bony thorax is
unremarkable.
IMPRESSION: Right upper lobe opacity is noted which is slightly increased
suggesting worsening pneumonia or atelectasis. Stable left upper
lobe opacity is noted. Hypoinflation of the lungs is noted.
